# Patient Record
Sex: Female | Born: 1945
Health system: Southern US, Community
[De-identification: ages and names within clinical notes are randomized; demographics above are authoritative.]

## PROBLEM LIST (undated history)

## (undated) DIAGNOSIS — M179 Osteoarthritis of knee, unspecified: Secondary | ICD-10-CM

## (undated) DIAGNOSIS — R011 Cardiac murmur, unspecified: Secondary | ICD-10-CM

## (undated) DIAGNOSIS — I779 Disorder of arteries and arterioles, unspecified: Secondary | ICD-10-CM

## (undated) DIAGNOSIS — I700 Atherosclerosis of aorta: Secondary | ICD-10-CM

## (undated) DIAGNOSIS — E119 Type 2 diabetes mellitus without complications: Secondary | ICD-10-CM

## (undated) DIAGNOSIS — T8859XA Other complications of anesthesia, initial encounter: Secondary | ICD-10-CM

## (undated) DIAGNOSIS — E7800 Pure hypercholesterolemia, unspecified: Secondary | ICD-10-CM

## (undated) DIAGNOSIS — E039 Hypothyroidism, unspecified: Secondary | ICD-10-CM

## (undated) DIAGNOSIS — F329 Major depressive disorder, single episode, unspecified: Secondary | ICD-10-CM

## (undated) DIAGNOSIS — D649 Anemia, unspecified: Secondary | ICD-10-CM

## (undated) DIAGNOSIS — M1710 Unilateral primary osteoarthritis, unspecified knee: Secondary | ICD-10-CM

## (undated) DIAGNOSIS — R0989 Other specified symptoms and signs involving the circulatory and respiratory systems: Secondary | ICD-10-CM

## (undated) DIAGNOSIS — Z972 Presence of dental prosthetic device (complete) (partial): Secondary | ICD-10-CM

## (undated) DIAGNOSIS — K219 Gastro-esophageal reflux disease without esophagitis: Secondary | ICD-10-CM

## (undated) DIAGNOSIS — B029 Zoster without complications: Secondary | ICD-10-CM

## (undated) DIAGNOSIS — F32A Depression, unspecified: Secondary | ICD-10-CM

## (undated) DIAGNOSIS — L570 Actinic keratosis: Secondary | ICD-10-CM

## (undated) DIAGNOSIS — R918 Other nonspecific abnormal finding of lung field: Secondary | ICD-10-CM

## (undated) HISTORY — DX: Hypothyroidism, unspecified: E03.9

## (undated) HISTORY — PX: BREAST CYST EXCISION: SHX579

## (undated) HISTORY — DX: Osteoarthritis of knee, unspecified: M17.9

## (undated) HISTORY — DX: Pure hypercholesterolemia, unspecified: E78.00

## (undated) HISTORY — DX: Unilateral primary osteoarthritis, unspecified knee: M17.10

## (undated) HISTORY — DX: Type 2 diabetes mellitus without complications: E11.9

## (undated) HISTORY — PX: INNER EAR SURGERY: SHX679

## (undated) HISTORY — DX: Depression, unspecified: F32.A

## (undated) HISTORY — DX: Major depressive disorder, single episode, unspecified: F32.9

## (undated) HISTORY — PX: FOOT SURGERY: SHX648

---

## 1983-11-07 HISTORY — PX: ABDOMINAL HYSTERECTOMY: SHX81

## 2004-08-09 ENCOUNTER — Ambulatory Visit: Payer: Self-pay | Admitting: Internal Medicine

## 2004-12-26 ENCOUNTER — Ambulatory Visit: Payer: Self-pay | Admitting: Internal Medicine

## 2005-01-04 ENCOUNTER — Ambulatory Visit: Payer: Self-pay | Admitting: Internal Medicine

## 2005-02-04 ENCOUNTER — Ambulatory Visit: Payer: Self-pay | Admitting: Internal Medicine

## 2005-08-14 ENCOUNTER — Ambulatory Visit: Payer: Self-pay | Admitting: Internal Medicine

## 2006-02-06 ENCOUNTER — Ambulatory Visit: Payer: Self-pay | Admitting: Internal Medicine

## 2006-08-15 ENCOUNTER — Ambulatory Visit: Payer: Self-pay | Admitting: Internal Medicine

## 2007-08-19 ENCOUNTER — Ambulatory Visit: Payer: Self-pay | Admitting: Internal Medicine

## 2008-08-20 ENCOUNTER — Ambulatory Visit: Payer: Self-pay | Admitting: Internal Medicine

## 2008-08-25 ENCOUNTER — Ambulatory Visit: Payer: Self-pay | Admitting: Internal Medicine

## 2009-03-09 ENCOUNTER — Ambulatory Visit: Payer: Self-pay | Admitting: General Surgery

## 2009-08-23 ENCOUNTER — Ambulatory Visit: Payer: Self-pay | Admitting: Internal Medicine

## 2010-08-24 ENCOUNTER — Ambulatory Visit: Payer: Self-pay | Admitting: Internal Medicine

## 2010-11-14 ENCOUNTER — Ambulatory Visit: Payer: Self-pay | Admitting: Internal Medicine

## 2011-02-05 ENCOUNTER — Ambulatory Visit: Payer: Self-pay | Admitting: Gynecologic Oncology

## 2011-02-28 ENCOUNTER — Ambulatory Visit: Payer: Self-pay | Admitting: Gynecologic Oncology

## 2011-03-07 ENCOUNTER — Ambulatory Visit: Payer: Self-pay | Admitting: Gynecologic Oncology

## 2011-03-21 ENCOUNTER — Ambulatory Visit: Payer: Self-pay | Admitting: Gynecologic Oncology

## 2011-03-28 ENCOUNTER — Ambulatory Visit: Payer: Self-pay | Admitting: Gynecologic Oncology

## 2011-03-31 LAB — PATHOLOGY REPORT

## 2011-04-04 ENCOUNTER — Ambulatory Visit: Payer: Self-pay | Admitting: Gynecologic Oncology

## 2011-04-07 ENCOUNTER — Ambulatory Visit: Payer: Self-pay | Admitting: Gynecologic Oncology

## 2011-04-11 ENCOUNTER — Ambulatory Visit: Payer: Self-pay | Admitting: Gynecologic Oncology

## 2011-05-07 ENCOUNTER — Ambulatory Visit: Payer: Self-pay | Admitting: Gynecologic Oncology

## 2011-06-07 ENCOUNTER — Ambulatory Visit: Payer: Self-pay | Admitting: Gynecologic Oncology

## 2011-08-28 ENCOUNTER — Ambulatory Visit: Payer: Self-pay | Admitting: Internal Medicine

## 2011-10-03 ENCOUNTER — Ambulatory Visit: Payer: Self-pay | Admitting: Gynecologic Oncology

## 2011-10-07 ENCOUNTER — Ambulatory Visit: Payer: Self-pay | Admitting: Gynecologic Oncology

## 2012-04-02 ENCOUNTER — Ambulatory Visit: Payer: Self-pay | Admitting: Gynecologic Oncology

## 2012-04-06 ENCOUNTER — Ambulatory Visit: Payer: Self-pay | Admitting: Gynecologic Oncology

## 2012-08-06 ENCOUNTER — Telehealth: Payer: Self-pay | Admitting: Internal Medicine

## 2012-08-06 DIAGNOSIS — Z0000 Encounter for general adult medical examination without abnormal findings: Secondary | ICD-10-CM

## 2012-08-06 NOTE — Telephone Encounter (Signed)
Order entered.  Let me know if I need to do anything else.

## 2012-08-06 NOTE — Telephone Encounter (Addendum)
Patient needing a mammogram before October 28 th in the afternoon at [facility] [facility]. I need a mammogram order.

## 2012-08-28 ENCOUNTER — Ambulatory Visit: Payer: Self-pay | Admitting: Internal Medicine

## 2012-09-03 NOTE — Telephone Encounter (Signed)
Per Herbert Seta at [place] patient had this done already On October 20,13.

## 2012-10-08 ENCOUNTER — Ambulatory Visit (INDEPENDENT_AMBULATORY_CARE_PROVIDER_SITE_OTHER): Payer: Medicare Other | Admitting: Internal Medicine

## 2012-10-08 ENCOUNTER — Encounter: Payer: Self-pay | Admitting: Internal Medicine

## 2012-10-08 VITALS — BP 120/72 | HR 84 | Temp 98.0°F | Ht 61.0 in | Wt 170.0 lb

## 2012-10-08 DIAGNOSIS — F329 Major depressive disorder, single episode, unspecified: Secondary | ICD-10-CM

## 2012-10-08 DIAGNOSIS — K219 Gastro-esophageal reflux disease without esophagitis: Secondary | ICD-10-CM

## 2012-10-08 DIAGNOSIS — F32A Depression, unspecified: Secondary | ICD-10-CM | POA: Insufficient documentation

## 2012-10-08 DIAGNOSIS — E119 Type 2 diabetes mellitus without complications: Secondary | ICD-10-CM

## 2012-10-08 DIAGNOSIS — E7800 Pure hypercholesterolemia, unspecified: Secondary | ICD-10-CM

## 2012-10-08 DIAGNOSIS — E1165 Type 2 diabetes mellitus with hyperglycemia: Secondary | ICD-10-CM | POA: Insufficient documentation

## 2012-10-08 DIAGNOSIS — F3289 Other specified depressive episodes: Secondary | ICD-10-CM

## 2012-10-08 DIAGNOSIS — E039 Hypothyroidism, unspecified: Secondary | ICD-10-CM | POA: Insufficient documentation

## 2012-10-08 MED ORDER — ALBUTEROL SULFATE HFA 108 (90 BASE) MCG/ACT IN AERS: 2.0000 | INHALATION_SPRAY | Freq: Four times a day (QID) | RESPIRATORY_TRACT | Status: DC | PRN

## 2012-10-08 MED ORDER — PANTOPRAZOLE SODIUM 40 MG PO TBEC: 40.0000 mg | DELAYED_RELEASE_TABLET | Freq: Every day | ORAL | Status: DC

## 2012-10-08 NOTE — Assessment & Plan Note (Signed)
On Prozac.  Doing well.  Follow.  

## 2012-10-08 NOTE — Progress Notes (Signed)
Subjective:    Patient ID: Jody Wang, female    DOB: 05/28/1946, 66 y.o.   MRN: [id number]  HPI 66 year old female with past history of diabetes, hypercholesterolemia and hypothyroidism who comes in today for a scheduled follow up.  States she is doing well.  No chest pain or tightness.  Feels good.  Stays active.  Working part time at [employer].  Knee is better.  Feels she still needs the Mobic.  Discussed trying to cut down on the dose and seeing if we could get her off.  Sugars averaging 130-140s.  Trying to watch what she eats.  Bowels stable.  No nausea or vomiting.    Past Medical History  Diagnosis Date  . Diabetes mellitus without complication   . Hypercholesterolemia   . Depression   . Hypothyroidism   . Osteoarthritis of knee     Current Outpatient Prescriptions on File Prior to Visit  Medication Sig Dispense Refill  . FLUoxetine (PROZAC) 20 MG capsule Take 20 mg by mouth daily.      Marland Kitchen levothyroxine (SYNTHROID, LEVOTHROID) 88 MCG tablet Take 88 mcg by mouth daily.      . metFORMIN (GLUCOPHAGE) 500 MG tablet Take 2 tablet in the AM, 1 tablet at lunch and 2 q pm      . pioglitazone (ACTOS) 30 MG tablet Take 30 mg by mouth daily.      . sitaGLIPtin (JANUVIA) 100 MG tablet Take 100 mg by mouth daily.      Marland Kitchen albuterol (PROVENTIL HFA;VENTOLIN HFA) 108 (90 BASE) MCG/ACT inhaler Inhale 2 puffs into the lungs every 6 (six) hours as needed for wheezing.  1 Inhaler  0  . pantoprazole (PROTONIX) 40 MG tablet Take 1 tablet (40 mg total) by mouth daily.  30 tablet  3    Review of Systems Patient denies any headache, lightheadedness or dizziness.  No significant sinus or allergy symptoms.  No chest pain, tightness or palpitations.  She does still report increased cough and some sob when she goes out in the cold.  No nausea or vomiting.  Does have some acid reflux issues.  Has to eat a snack at night - secondary to her diabetes.  Has started eating it earlier.  No abdominal pain or cramping.   No bowel change, such as diarrhea, constipation, BRBPR or melana.  No urine change.        Objective:   Physical Exam Filed Vitals:   10/08/12 1426  BP: 120/72  Pulse: 84  Temp: 98 F (84.47 C)   66 year old female in no acute distress.   HEENT:  Nares - clear.  OP- without lesions or erythema.  NECK:  Supple, nontender.  Left carotid bruit vs radiation of murmur.   HEART:  Appears to be regular.  I/VI systolic murmur.  (present previously).   LUNGS:  Without crackles or wheezing audible.  Respirations even and unlabored.   RADIAL PULSE:  Equal bilaterally.  ABDOMEN:  Soft, nontender.  No audible abdominal bruit.   EXTREMITIES:  No increased edema to be present.                     Assessment & Plan:  PULMONARY.  With the increased cough and sob - gave her samples of QVAR.  Will also give her a rescue inhaler to use if needed.  Treat acid reflux.  See if symptoms resolve.  Treat acid reflux.  Get her back in soon to reassess.  CARDIOVASCULAR.  Had a recent stress test (per her report) - negative.  Describes the sob as outlined.  She states it worsens if she is in cold weather.  Will treat acid reflux and pulmonary symptoms as outlined.  Hold on repeat cardiac w/up.  Obtain results.  Follow closely.  If any change in symptoms or problems, she is to be reevaluated.  She was comfortable with this plans.  She reports she has used her roommates inhaler and this has helped the sob.    MSK.  Knee is doing better.  She still believes she needs the Mobic. Discussed risk and side effects of long term use.  Also with the concern regarding the acid reflux, would like to get her off of the medication.  Will have her reduce the dose to 7.5mg  q day.  Follow.    HEALTH MAINTENANCE.  Obtain records to review.   Get her scheduled for her physical when due.

## 2012-10-08 NOTE — Assessment & Plan Note (Signed)
On thyroid replacement.  Check tsh.  

## 2012-10-08 NOTE — Patient Instructions (Addendum)
It was nice seeing you today.  I am glad you are doing well.  I do want you to start protonix 40mg  - take 30 min before bed and zantac (ranitidine 150mg ) 30 minutes before your evening meal.  Let me know if persistent problems.

## 2012-10-08 NOTE — Assessment & Plan Note (Signed)
On no medication.  Low cholesterol diet and exercise.  Follow.   

## 2012-10-09 ENCOUNTER — Encounter: Payer: Self-pay | Admitting: Internal Medicine

## 2012-10-09 DIAGNOSIS — K219 Gastro-esophageal reflux disease without esophagitis: Secondary | ICD-10-CM | POA: Insufficient documentation

## 2012-10-09 MED ORDER — MELOXICAM 7.5 MG PO TABS: ORAL_TABLET | ORAL | Status: DC

## 2012-10-09 NOTE — Assessment & Plan Note (Signed)
Sugars averaging 130-140s.  Trying to watch what she eats.  Check met b and a1c.

## 2012-10-09 NOTE — Assessment & Plan Note (Signed)
With the reflux as outlined.  Will start Protonix 40mg  q am and instructed her to take Zantac before her evening meal.  Follow closely.  Get her back in soon to reassess.

## 2012-10-14 ENCOUNTER — Other Ambulatory Visit: Payer: Self-pay | Admitting: *Deleted

## 2012-10-14 NOTE — Telephone Encounter (Signed)
Called in script for One Touch Ultra Test Strips

## 2012-10-17 ENCOUNTER — Other Ambulatory Visit: Payer: Self-pay | Admitting: *Deleted

## 2012-10-17 MED ORDER — GLUCOSE BLOOD VI STRP: ORAL_STRIP | Status: DC

## 2012-11-06 ENCOUNTER — Ambulatory Visit: Payer: Self-pay | Admitting: Gynecologic Oncology

## 2012-12-07 ENCOUNTER — Ambulatory Visit: Payer: Self-pay | Admitting: Gynecologic Oncology

## 2012-12-22 ENCOUNTER — Telehealth: Payer: Self-pay | Admitting: Internal Medicine

## 2012-12-22 MED ORDER — MELOXICAM 7.5 MG PO TABS: ORAL_TABLET | ORAL | Status: DC

## 2012-12-22 NOTE — Telephone Encounter (Signed)
Refilled mobic  ?

## 2013-01-01 ENCOUNTER — Other Ambulatory Visit: Payer: Self-pay | Admitting: *Deleted

## 2013-01-01 MED ORDER — METFORMIN HCL 500 MG PO TABS: ORAL_TABLET | ORAL | Status: DC

## 2013-01-01 NOTE — Telephone Encounter (Signed)
Sent in to pharmacy.  

## 2013-01-02 ENCOUNTER — Other Ambulatory Visit: Payer: Self-pay | Admitting: *Deleted

## 2013-01-03 MED ORDER — FLUOXETINE HCL 20 MG PO CAPS: 20.0000 mg | ORAL_CAPSULE | Freq: Every day | ORAL | Status: DC

## 2013-01-15 ENCOUNTER — Encounter: Payer: Self-pay | Admitting: Internal Medicine

## 2013-01-15 ENCOUNTER — Ambulatory Visit (INDEPENDENT_AMBULATORY_CARE_PROVIDER_SITE_OTHER): Payer: Medicare Other | Admitting: Internal Medicine

## 2013-01-15 VITALS — BP 102/60 | HR 88 | Temp 98.8°F | Ht 61.0 in | Wt 167.2 lb

## 2013-01-15 DIAGNOSIS — F32A Depression, unspecified: Secondary | ICD-10-CM

## 2013-01-15 DIAGNOSIS — F3289 Other specified depressive episodes: Secondary | ICD-10-CM

## 2013-01-15 DIAGNOSIS — E7800 Pure hypercholesterolemia, unspecified: Secondary | ICD-10-CM

## 2013-01-15 DIAGNOSIS — F329 Major depressive disorder, single episode, unspecified: Secondary | ICD-10-CM

## 2013-01-15 DIAGNOSIS — K219 Gastro-esophageal reflux disease without esophagitis: Secondary | ICD-10-CM

## 2013-01-15 DIAGNOSIS — E119 Type 2 diabetes mellitus without complications: Secondary | ICD-10-CM

## 2013-01-15 DIAGNOSIS — E039 Hypothyroidism, unspecified: Secondary | ICD-10-CM

## 2013-01-15 LAB — CBC WITH DIFFERENTIAL/PLATELET
Basophils Relative: 0.2 % (ref 0.0–3.0)
Eosinophils Absolute: 0.5 10*3/uL (ref 0.0–0.7)
HCT: 39 % (ref 36.0–46.0)
Hemoglobin: 13.2 g/dL (ref 12.0–15.0)
MCHC: 33.8 g/dL (ref 30.0–36.0)
MCV: 82.4 fl (ref 78.0–100.0)
Monocytes Absolute: 0.6 10*3/uL (ref 0.1–1.0)
Neutro Abs: 5.7 10*3/uL (ref 1.4–7.7)
RBC: 4.73 Mil/uL (ref 3.87–5.11)

## 2013-01-15 LAB — MICROALBUMIN / CREATININE URINE RATIO: Microalb Creat Ratio: 0.5 mg/g (ref 0.0–30.0)

## 2013-01-15 LAB — BASIC METABOLIC PANEL
BUN: 13 mg/dL (ref 6–23)
CO2: 23 mEq/L (ref 19–32)
Chloride: 104 mEq/L (ref 96–112)
Creatinine, Ser: 0.8 mg/dL (ref 0.4–1.2)

## 2013-01-15 LAB — LDL CHOLESTEROL, DIRECT: Direct LDL: 154.5 mg/dL

## 2013-01-15 LAB — LIPID PANEL
Cholesterol: 209 mg/dL — ABNORMAL HIGH (ref 0–200)
Total CHOL/HDL Ratio: 5
VLDL: 36.2 mg/dL (ref 0.0–40.0)

## 2013-01-15 LAB — TSH: TSH: 2.51 u[IU]/mL (ref 0.35–5.50)

## 2013-01-15 LAB — HEPATIC FUNCTION PANEL
Albumin: 4.2 g/dL (ref 3.5–5.2)
Alkaline Phosphatase: 69 U/L (ref 39–117)

## 2013-01-15 MED ORDER — GLUCOSE BLOOD VI STRP: 1.0000 | ORAL_STRIP | Status: DC | PRN

## 2013-01-16 ENCOUNTER — Encounter: Payer: Self-pay | Admitting: Internal Medicine

## 2013-01-16 NOTE — Assessment & Plan Note (Signed)
On thyroid replacement.  Check tsh.  

## 2013-01-16 NOTE — Assessment & Plan Note (Signed)
Sugars averaging 130-140s.  Trying to watch what she eats.  Check met b and a1c.  She stopped her actos and does not want to restart.  Follow.

## 2013-01-16 NOTE — Progress Notes (Signed)
Subjective:    Patient ID: Jody Wang, female    DOB: [birth year], 67 y.o.   MRN: [id number]  HPI 67 year old female with past history of diabetes, hypercholesterolemia and hypothyroidism who comes in today to follow up on these issues as well as for a compete physical exam.  States she is doing well.  No chest pain or tightness.  Feels good.  Stays active.  Working part time at [employer].   Sugars averaging 130-140s.  Brought in no recorded sugar readings.  Trying to watch what she eats.  Has stopped her actos.  Still on metformin and Januvia.  Does not want to take actos.  Has been off for two weeks.  Bowels stable.  No nausea or vomiting.  Her breathing is better since she gave her bird away.  Still using the inhalers.  States cold air aggravates also.  Overall she feels better.   Past Medical History  Diagnosis Date  . Diabetes mellitus without complication   . Hypercholesterolemia   . Depression   . Hypothyroidism   . Osteoarthritis of knee     Current Outpatient Prescriptions on File Prior to Visit  Medication Sig Dispense Refill  . albuterol (PROVENTIL HFA;VENTOLIN HFA) 108 (90 BASE) MCG/ACT inhaler Inhale 2 puffs into the lungs every 6 (six) hours as needed for wheezing.  1 Inhaler  0  . FLUoxetine (PROZAC) 20 MG capsule Take 1 capsule (20 mg total) by mouth daily.  90 capsule  1  . glucose blood test strip One touch ultra test strip Check blood sugar bid  100 each  4  . levothyroxine (SYNTHROID, LEVOTHROID) 88 MCG tablet Take 88 mcg by mouth daily.      . meloxicam (MOBIC) 7.5 MG tablet Take 1-2 tablets q day prn.  60 tablet  1  . metFORMIN (GLUCOPHAGE) 500 MG tablet Take 2 tablet in the AM, 1 tablet at lunch and 2 q pm  60 tablet  5  . pravastatin (PRAVACHOL) 20 MG tablet Take 20 mg by mouth daily.      . sitaGLIPtin (JANUVIA) 100 MG tablet Take 100 mg by mouth daily.      . pantoprazole (PROTONIX) 40 MG tablet Take 1 tablet (40 mg total) by mouth daily.  30 tablet  3  .  pioglitazone (ACTOS) 30 MG tablet Take 30 mg by mouth daily.       No current facility-administered medications on file prior to visit.    Review of Systems Patient denies any headache, lightheadedness or dizziness.  No significant sinus or allergy symptoms.  No chest pain, tightness or palpitations.  Breathing is better.  Aggravated by cold weather. No nausea or vomiting.   No abdominal pain or cramping.  No bowel change, such as diarrhea, constipation, BRBPR or melana.  No urine change.        Objective:   Physical Exam  Filed Vitals:   01/15/13 1036  BP: 102/60  Pulse: 88  Temp: 98.8 F (37.1 C)   Blood pressure recheck:  128/78, pulse 55  67 year old female in no acute distress.   HEENT:  Nares- clear.  Oropharynx - without lesions. NECK:  Supple.  Nontender.  Question of bruit vs radiation of murmur.  HEART:  Appears to be regular.  I/VI systolic murmur (present previously).   LUNGS:  No crackles or wheezing audible.  Respirations even and unlabored.  RADIAL PULSE:  Equal bilaterally.    BREASTS:  No nipple discharge or nipple retraction  present.  Could not appreciate any distinct nodules or axillary adenopathy.  ABDOMEN:  Soft, nontender.  Bowel sounds present and normal.  No audible abdominal bruit.  GU:  She declines.     EXTREMITIES:  No increased edema present.  DP pulses palpable and equal bilaterally.   Feet without lesions.          Assessment & Plan:  PULMONARY.  Breathing is better.  Continue inhalers. (will continue flovent and albuterol).   Better since she gave away her bird.  Follow.    CARDIOVASCULAR.  Had a recent stress test (per her report) - negative.  Breathing better.  Follow.    MSK.  Knee is doing better.  Follow.   HEALTH MAINTENANCE.  Physical today.  Schedule mammogram when due.  She declines colonoscopy. Discussed again with her today regarding the importance of screening.  She continues to decline.

## 2013-01-16 NOTE — Assessment & Plan Note (Signed)
On no medication.  Low cholesterol diet and exercise.  Follow.   

## 2013-01-16 NOTE — Assessment & Plan Note (Signed)
Not reported as an issue today.  Continue current regimen.   

## 2013-01-16 NOTE — Assessment & Plan Note (Signed)
On Prozac.  Doing well.  Follow.  

## 2013-01-22 ENCOUNTER — Telehealth: Payer: Self-pay | Admitting: Internal Medicine

## 2013-01-22 MED ORDER — ALPRAZOLAM 0.25 MG PO TABS: 0.2500 mg | ORAL_TABLET | Freq: Two times a day (BID) | ORAL | Status: DC | PRN

## 2013-01-22 NOTE — Telephone Encounter (Signed)
Spoke to pt.  She has taken xanax previously and tolerated.  Please call in xanax .25mg  - one po bid prn #20 with no refills.  [facility]

## 2013-01-22 NOTE — Telephone Encounter (Signed)
Please advise 

## 2013-01-22 NOTE — Telephone Encounter (Signed)
Patient needing something to get through her boyfriends death. He passed on yesterday and she is extremely upset.

## 2013-01-22 NOTE — Telephone Encounter (Signed)
Called into pharmacy

## 2013-02-01 ENCOUNTER — Telehealth: Payer: Self-pay | Admitting: Internal Medicine

## 2013-02-01 MED ORDER — ALPRAZOLAM 0.25 MG PO TABS: 0.2500 mg | ORAL_TABLET | Freq: Two times a day (BID) | ORAL | Status: DC | PRN

## 2013-02-01 NOTE — Telephone Encounter (Signed)
Pt notified of lab results.  a1c 7.1.  Will follow.  Same meds for now.  Improved.  Low cholesterol diet.  Will follow.  Still with increased stress from recent unexpected death of her boyfriend.  Also grandson just diagnosed with stage 4 brain tumor.  Xanax working for her.  Needs refill.  Called refill xanax .25mg  bid prn #30 with on refills.  She will call if needs anything more.

## 2013-02-07 LAB — HM DIABETES EYE EXAM

## 2013-02-25 ENCOUNTER — Telehealth: Payer: Self-pay

## 2013-02-25 MED ORDER — ALPRAZOLAM 0.25 MG PO TABS: 0.2500 mg | ORAL_TABLET | Freq: Two times a day (BID) | ORAL | Status: DC | PRN

## 2013-02-25 NOTE — Telephone Encounter (Signed)
Ok to call in refill of xanax x 1.

## 2013-02-25 NOTE — Telephone Encounter (Signed)
Please Advise....  Alprazolam (Xanax) 0.25 mg tablet  take 1 tablet by mouth twice a day if needed  [facility]

## 2013-02-25 NOTE — Telephone Encounter (Signed)
Called in the Xanax #60 with 1 rf to [employer]

## 2013-03-03 ENCOUNTER — Other Ambulatory Visit: Payer: Self-pay | Admitting: *Deleted

## 2013-03-03 MED ORDER — MELOXICAM 7.5 MG PO TABS: ORAL_TABLET | ORAL | Status: DC

## 2013-04-28 ENCOUNTER — Telehealth: Payer: Self-pay | Admitting: *Deleted

## 2013-04-28 NOTE — Telephone Encounter (Signed)
Pt is currently on Meloxicam (refilled on 03/03/13 #60 with 3 refills)-Pt is now asking for Nabumetone 750mg  BID for leg inflammation ([facility])

## 2013-04-29 NOTE — Telephone Encounter (Signed)
Why does she want to change medication?  Is she having worsening problems?  If increased problems or change in symptoms, will need reevaluation.

## 2013-04-29 NOTE — Telephone Encounter (Signed)
I was talking about staying on antiinflammatories in general.  This is an antiiflammatory also.  Will need to monitor blood pressure and monitor for any GI side effects.

## 2013-04-29 NOTE — Telephone Encounter (Signed)
Pt states that you told her that you didn't want her to take the Meloxicam long term, and she tried her friends medication and it worked better. She recently saw someone for her legs & will contact their office to see if they would let her try the Nabumetone first. If not, she will call back to schedule an appt.

## 2013-05-01 ENCOUNTER — Other Ambulatory Visit: Payer: Self-pay | Admitting: *Deleted

## 2013-05-01 MED ORDER — PANTOPRAZOLE SODIUM 40 MG PO TBEC: 40.0000 mg | DELAYED_RELEASE_TABLET | Freq: Every day | ORAL | Status: DC

## 2013-05-01 NOTE — Telephone Encounter (Signed)
Will discuss further at follow-up visit.

## 2013-05-13 ENCOUNTER — Other Ambulatory Visit: Payer: Self-pay | Admitting: *Deleted

## 2013-05-13 MED ORDER — ALPRAZOLAM 0.25 MG PO TABS: 0.2500 mg | ORAL_TABLET | Freq: Two times a day (BID) | ORAL | Status: DC | PRN

## 2013-05-13 NOTE — Telephone Encounter (Signed)
ok'd refill for alprazolam #60 with no refills.   

## 2013-05-13 NOTE — Telephone Encounter (Signed)
Rx left on voicemail

## 2013-05-19 ENCOUNTER — Ambulatory Visit: Payer: Medicare Other | Admitting: Internal Medicine

## 2013-06-16 ENCOUNTER — Encounter: Payer: Self-pay | Admitting: Internal Medicine

## 2013-06-16 ENCOUNTER — Ambulatory Visit (INDEPENDENT_AMBULATORY_CARE_PROVIDER_SITE_OTHER): Payer: Medicare Other | Admitting: Internal Medicine

## 2013-06-16 VITALS — BP 120/70 | HR 69 | Temp 98.4°F | Ht 61.0 in | Wt 167.5 lb

## 2013-06-16 DIAGNOSIS — E119 Type 2 diabetes mellitus without complications: Secondary | ICD-10-CM

## 2013-06-16 DIAGNOSIS — E039 Hypothyroidism, unspecified: Secondary | ICD-10-CM

## 2013-06-16 DIAGNOSIS — F3289 Other specified depressive episodes: Secondary | ICD-10-CM

## 2013-06-16 DIAGNOSIS — K219 Gastro-esophageal reflux disease without esophagitis: Secondary | ICD-10-CM

## 2013-06-16 DIAGNOSIS — E7800 Pure hypercholesterolemia, unspecified: Secondary | ICD-10-CM

## 2013-06-16 DIAGNOSIS — F329 Major depressive disorder, single episode, unspecified: Secondary | ICD-10-CM

## 2013-06-16 DIAGNOSIS — F32A Depression, unspecified: Secondary | ICD-10-CM

## 2013-06-17 ENCOUNTER — Encounter: Payer: Self-pay | Admitting: Internal Medicine

## 2013-06-17 ENCOUNTER — Telehealth: Payer: Self-pay | Admitting: Internal Medicine

## 2013-06-17 NOTE — Assessment & Plan Note (Signed)
Not reported as an issue today.  Continue current regimen.   

## 2013-06-17 NOTE — Assessment & Plan Note (Signed)
On thyroid replacement.  Check tsh.  

## 2013-06-17 NOTE — Assessment & Plan Note (Signed)
On no medication.  Low cholesterol diet and exercise.  Follow.   

## 2013-06-17 NOTE — Assessment & Plan Note (Signed)
Sugars averaging 140-180s.  Trying to watch what she eats.  Check met b and a1c.  She stopped her actos and does not want to restart.  Follow.

## 2013-06-17 NOTE — Assessment & Plan Note (Signed)
On Prozac.  Taking xanax prn.  Boyfriend recently passed away unexpectedly.  Hard time dealing with this.  Offered counseling.  She declines.  Will notify me when agreeable.

## 2013-06-17 NOTE — Progress Notes (Signed)
Subjective:    Patient ID: Jody Wang, female    DOB: [birth year], 67 y.o.   MRN: [id number]  HPI 67 year old female with past history of diabetes, hypercholesterolemia and hypothyroidism who comes in today for a scheduled follow up.   Increased stress.  Her boyfriend of five years passed away unexpectedly.  Having a hard time dealing with this.  Taking xanax prn.  On prozac.  Cries easily.  Has good support.  Discussed counseling.  Will think about this.   Working part time at [employer].  Enjoys this.  Sugars elevated.  States averaging 140-180 whenever checked.   Brought in no recorded sugar readings.  Trying to watch what she eats.  Has stopped her actos.  Still on metformin and Januvia.  Does not want to take actos.   Bowels stable.  No nausea or vomiting.  Her breathing is better since she gave her bird away.  Not requiring the inhalers.     Past Medical History  Diagnosis Date  . Diabetes mellitus without complication   . Hypercholesterolemia   . Depression   . Hypothyroidism   . Osteoarthritis of knee     Current Outpatient Prescriptions on File Prior to Visit  Medication Sig Dispense Refill  . albuterol (PROVENTIL HFA;VENTOLIN HFA) 108 (90 BASE) MCG/ACT inhaler Inhale 2 puffs into the lungs every 6 (six) hours as needed for wheezing.  1 Inhaler  0  . ALPRAZolam (XANAX) 0.25 MG tablet Take 1 tablet (0.25 mg total) by mouth 2 (two) times daily as needed for sleep.  60 tablet  0  . FLUoxetine (PROZAC) 20 MG capsule Take 1 capsule (20 mg total) by mouth daily.  90 capsule  1  . glucose blood test strip One touch ultra test strip Check blood sugar bid  100 each  4  . levothyroxine (SYNTHROID, LEVOTHROID) 88 MCG tablet Take 88 mcg by mouth daily.      . meloxicam (MOBIC) 7.5 MG tablet Take 1-2 tablets q day prn.  60 tablet  3  . metFORMIN (GLUCOPHAGE) 500 MG tablet Take 2 tablet in the AM, 1 tablet at lunch and 2 q pm  60 tablet  5  . pantoprazole (PROTONIX) 40 MG tablet Take 1 tablet  (40 mg total) by mouth daily.  30 tablet  5  . pravastatin (PRAVACHOL) 20 MG tablet Take 20 mg by mouth daily.      . sitaGLIPtin (JANUVIA) 100 MG tablet Take 100 mg by mouth daily.       Current Facility-Administered Medications on File Prior to Visit  Medication Dose Route Frequency Provider Last Rate Last Dose  . glucose blood test strip STRP 1 each  1 each Other PRN Charm Barges, MD        Review of Systems Patient denies any headache, lightheadedness or dizziness.  No significant sinus or allergy symptoms.  No chest pain, tightness or palpitations.  Breathing is better.   No nausea or vomiting.   No abdominal pain or cramping.  No bowel change, such as diarrhea, constipation, BRBPR or melana.  No urine change.   Increased stress as outlined.       Objective:   Physical Exam  Filed Vitals:   06/16/13 1628  BP: 120/70  Pulse: 69  Temp: 98.4 F (53.57 C)   67 year old female in no acute distress.   HEENT:  Nares- clear.  Oropharynx - without lesions. NECK:  Supple.  Nontender.  Question of bruit vs radiation  of murmur.  HEART:  Appears to be regular.  I/VI systolic murmur (present previously).   LUNGS:  No crackles or wheezing audible.  Respirations even and unlabored.  RADIAL PULSE:  Equal bilaterally.   ABDOMEN:  Soft, nontender.  Bowel sounds present and normal.  No audible abdominal bruit.     EXTREMITIES:  No increased edema present.  DP pulses palpable and equal bilaterally.   Feet without lesions.          Assessment & Plan:  PULMONARY.  Breathing is better.  Off inhalers. (will continue flovent and albuterol).   Better since she gave away her bird.  Follow.    CARDIOVASCULAR.  Had a recent stress test (per her report) - negative.  Breathing better.  Follow.    MSK.  Knee pain.  Discussed further /w/up.  She is taking mobic.  Can add tylenol.  Will notify me if she desires further w/up.    HEALTH MAINTENANCE.  Physical last visit.  She declines colonoscopy.

## 2013-06-17 NOTE — Telephone Encounter (Signed)
Needs a follow up appt in 2 months ( ).  Schedule fasting labs within one week.  Thanks.

## 2013-06-20 NOTE — Telephone Encounter (Signed)
Left message on both home and cell phone asking pt to call office  °

## 2013-06-24 ENCOUNTER — Other Ambulatory Visit: Payer: Self-pay | Admitting: *Deleted

## 2013-06-24 MED ORDER — LEVOTHYROXINE SODIUM 88 MCG PO TABS: 88.0000 ug | ORAL_TABLET | Freq: Every day | ORAL | Status: DC

## 2013-06-24 NOTE — Telephone Encounter (Signed)
Left message on both home and cell phone asking pt to call office  °

## 2013-06-24 NOTE — Telephone Encounter (Signed)
Spoke with pt and scheduled

## 2013-06-25 ENCOUNTER — Encounter: Payer: Self-pay | Admitting: *Deleted

## 2013-06-27 ENCOUNTER — Other Ambulatory Visit: Payer: Self-pay | Admitting: *Deleted

## 2013-06-27 MED ORDER — FLUOXETINE HCL 20 MG PO CAPS: 20.0000 mg | ORAL_CAPSULE | Freq: Every day | ORAL | Status: DC

## 2013-06-30 ENCOUNTER — Other Ambulatory Visit (INDEPENDENT_AMBULATORY_CARE_PROVIDER_SITE_OTHER): Payer: Medicare Other

## 2013-06-30 ENCOUNTER — Encounter: Payer: Self-pay | Admitting: Internal Medicine

## 2013-06-30 DIAGNOSIS — E7800 Pure hypercholesterolemia, unspecified: Secondary | ICD-10-CM

## 2013-06-30 DIAGNOSIS — E119 Type 2 diabetes mellitus without complications: Secondary | ICD-10-CM

## 2013-06-30 LAB — COMPREHENSIVE METABOLIC PANEL
AST: 20 U/L (ref 0–37)
Alkaline Phosphatase: 72 U/L (ref 39–117)
BUN: 14 mg/dL (ref 6–23)
Creatinine, Ser: 0.8 mg/dL (ref 0.4–1.2)
Glucose, Bld: 176 mg/dL — ABNORMAL HIGH (ref 70–99)
Total Bilirubin: 0.6 mg/dL (ref 0.3–1.2)

## 2013-06-30 LAB — LIPID PANEL
Cholesterol: 254 mg/dL — ABNORMAL HIGH (ref 0–200)
HDL: 36.3 mg/dL — ABNORMAL LOW (ref >39.00)
Total CHOL/HDL Ratio: 7
Triglycerides: 350 mg/dL — ABNORMAL HIGH (ref 0.0–149.0)
VLDL: 70 mg/dL — ABNORMAL HIGH (ref 0.0–40.0)

## 2013-06-30 NOTE — Telephone Encounter (Signed)
Lab appointment 8/25  Dr appointment 10/20 pt aware

## 2013-07-28 ENCOUNTER — Other Ambulatory Visit: Payer: Self-pay | Admitting: *Deleted

## 2013-07-29 NOTE — Telephone Encounter (Signed)
Left message for pt to return my call regarding Metformin directions

## 2013-07-31 MED ORDER — METFORMIN HCL 500 MG PO TABS: ORAL_TABLET | ORAL | Status: DC

## 2013-07-31 NOTE — Telephone Encounter (Signed)
Spoke with pt, and clarified directions with her. She is taking 2 tabs in the AM, 1 tab at lunch, and 2 tabs in the evening, per Med list. Quantity sent as #150 rather than requested #60.

## 2013-07-31 NOTE — Telephone Encounter (Signed)
Left message for pt to return my call.

## 2013-08-25 ENCOUNTER — Ambulatory Visit: Payer: Medicare Other | Admitting: Internal Medicine

## 2013-08-26 ENCOUNTER — Encounter: Payer: Self-pay | Admitting: *Deleted

## 2013-09-01 ENCOUNTER — Other Ambulatory Visit: Payer: Self-pay | Admitting: *Deleted

## 2013-09-01 ENCOUNTER — Ambulatory Visit: Payer: Self-pay | Admitting: Internal Medicine

## 2013-09-01 MED ORDER — PRAVASTATIN SODIUM 20 MG PO TABS: 20.0000 mg | ORAL_TABLET | Freq: Every day | ORAL | Status: DC

## 2013-09-08 ENCOUNTER — Ambulatory Visit: Payer: Self-pay | Admitting: Internal Medicine

## 2013-09-09 ENCOUNTER — Encounter: Payer: Self-pay | Admitting: Internal Medicine

## 2013-09-15 ENCOUNTER — Ambulatory Visit: Payer: Self-pay | Admitting: Internal Medicine

## 2013-09-17 ENCOUNTER — Telehealth: Payer: Self-pay | Admitting: *Deleted

## 2013-09-17 NOTE — Telephone Encounter (Signed)
Dr. Deboraha Sprang called to report that her breast biopsy was benign. No evidence of malignancy. Recommends yearly imaging

## 2013-09-17 NOTE — Telephone Encounter (Signed)
noted 

## 2013-09-18 ENCOUNTER — Telehealth: Payer: Self-pay | Admitting: Internal Medicine

## 2013-09-18 NOTE — Telephone Encounter (Signed)
Pt came by and dropped of forms to be filled out for her medication. I put them in Dr. Roby Lofts inbox up front. Pt also wanted to me to let Dr. Lorin Picket know that her Jody Wang passed away 4 weeks ago.

## 2013-09-19 ENCOUNTER — Other Ambulatory Visit: Payer: Self-pay | Admitting: *Deleted

## 2013-09-19 MED ORDER — ALPRAZOLAM 0.25 MG PO TABS: 0.2500 mg | ORAL_TABLET | Freq: Two times a day (BID) | ORAL | Status: DC | PRN

## 2013-09-19 NOTE — Telephone Encounter (Signed)
Okay to refill? 

## 2013-09-19 NOTE — Telephone Encounter (Signed)
Refilled xanax #60 with no refills.   

## 2013-09-19 NOTE — Telephone Encounter (Signed)
Noted. Will complete forms

## 2013-09-26 ENCOUNTER — Encounter: Payer: Self-pay | Admitting: Internal Medicine

## 2013-09-29 ENCOUNTER — Encounter: Payer: Self-pay | Admitting: Internal Medicine

## 2013-10-06 ENCOUNTER — Telehealth: Payer: Self-pay | Admitting: Internal Medicine

## 2013-10-06 ENCOUNTER — Encounter: Payer: Self-pay | Admitting: Internal Medicine

## 2013-10-06 ENCOUNTER — Ambulatory Visit (INDEPENDENT_AMBULATORY_CARE_PROVIDER_SITE_OTHER): Payer: Medicare Other | Admitting: Internal Medicine

## 2013-10-06 ENCOUNTER — Encounter (INDEPENDENT_AMBULATORY_CARE_PROVIDER_SITE_OTHER): Payer: Self-pay

## 2013-10-06 ENCOUNTER — Ambulatory Visit (INDEPENDENT_AMBULATORY_CARE_PROVIDER_SITE_OTHER)
Admission: RE | Admit: 2013-10-06 | Discharge: 2013-10-06 | Disposition: A | Discharge status: Home / Self Care | Payer: Medicare Other | Source: Ambulatory Visit | Attending: Internal Medicine | Admitting: Internal Medicine

## 2013-10-06 VITALS — BP 106/66 | HR 87 | Temp 98.2°F | Resp 12 | Ht 61.0 in | Wt 164.5 lb

## 2013-10-06 DIAGNOSIS — E7800 Pure hypercholesterolemia, unspecified: Secondary | ICD-10-CM

## 2013-10-06 DIAGNOSIS — M25519 Pain in unspecified shoulder: Secondary | ICD-10-CM

## 2013-10-06 DIAGNOSIS — M25511 Pain in right shoulder: Secondary | ICD-10-CM

## 2013-10-06 DIAGNOSIS — K219 Gastro-esophageal reflux disease without esophagitis: Secondary | ICD-10-CM

## 2013-10-06 DIAGNOSIS — F329 Major depressive disorder, single episode, unspecified: Secondary | ICD-10-CM

## 2013-10-06 DIAGNOSIS — E039 Hypothyroidism, unspecified: Secondary | ICD-10-CM

## 2013-10-06 DIAGNOSIS — F3289 Other specified depressive episodes: Secondary | ICD-10-CM

## 2013-10-06 DIAGNOSIS — F32A Depression, unspecified: Secondary | ICD-10-CM

## 2013-10-06 DIAGNOSIS — E119 Type 2 diabetes mellitus without complications: Secondary | ICD-10-CM

## 2013-10-06 MED ORDER — TRAZODONE HCL 50 MG PO TABS: 25.0000 mg | ORAL_TABLET | Freq: Every evening | ORAL | Status: DC | PRN

## 2013-10-06 MED ORDER — MELOXICAM 15 MG PO TABS: 15.0000 mg | ORAL_TABLET | Freq: Every day | ORAL | Status: DC

## 2013-10-06 MED ORDER — PANTOPRAZOLE SODIUM 40 MG PO TBEC: 40.0000 mg | DELAYED_RELEASE_TABLET | Freq: Every day | ORAL | Status: DC

## 2013-10-06 NOTE — Progress Notes (Signed)
Pre visit review using our clinic review tool, if applicable. No additional management support is needed unless otherwise documented below in the visit note. 

## 2013-10-06 NOTE — Progress Notes (Signed)
Subjective:    Patient ID: Jody Wang, female    DOB: 1945-12-01, 67 y.o.   MRN: [id number]  HPI 67 year old female with past history of diabetes, hypercholesterolemia and hypothyroidism who comes in today for a scheduled follow up.   Increased stress.  Her boyfriend of five years passed away unexpectedly.  Having a hard time dealing with this.  Her grandson also just recently passed away.  States she is not sleeping well.  Feels she needs something to help her sleep.  Taking xanax prn.  On prozac.  Cries easily.  Has good support.  Have discussed counseling.  Working part time at [employer].  Enjoys this.  Sugars elevated.  States averaging 140-180 whenever checked.  Has stopped her actos.  Still on metformin and Januvia.  Does not want to take actos.   With the increased stress, she has not been watching her diet.  Not exercising.  Bowels stable.  No nausea or vomiting.  Having increased right shoulder pain.  Limited rom.  Increased pain with full extension.      Past Medical History  Diagnosis Date  . Diabetes mellitus without complication   . Hypercholesterolemia   . Depression   . Hypothyroidism   . Osteoarthritis of knee     Current Outpatient Prescriptions on File Prior to Visit  Medication Sig Dispense Refill  . ALPRAZolam (XANAX) 0.25 MG tablet Take 1 tablet (0.25 mg total) by mouth 2 (two) times daily as needed for sleep.  60 tablet  0  . FLUoxetine (PROZAC) 20 MG capsule Take 1 capsule (20 mg total) by mouth daily.  90 capsule  1  . glucose blood test strip One touch ultra test strip Check blood sugar bid  100 each  4  . levothyroxine (SYNTHROID, LEVOTHROID) 88 MCG tablet Take 1 tablet (88 mcg total) by mouth daily.  30 tablet  10  . meloxicam (MOBIC) 7.5 MG tablet Take 1-2 tablets q day prn.  60 tablet  3  . metFORMIN (GLUCOPHAGE) 500 MG tablet Take 2 tablet in the AM, 1 tablet at lunch and 2 q pm  150 tablet  5  . pantoprazole (PROTONIX) 40 MG tablet Take 1 tablet (40 mg total)  by mouth daily.  30 tablet  5  . pravastatin (PRAVACHOL) 20 MG tablet Take 1 tablet (20 mg total) by mouth daily.  30 tablet  5  . sitaGLIPtin (JANUVIA) 100 MG tablet Take 100 mg by mouth daily.       Current Facility-Administered Medications on File Prior to Visit  Medication Dose Route Frequency Provider Last Rate Last Dose  . glucose blood test strip STRP 1 each  1 each Other PRN Charm Barges, MD        Review of Systems Patient denies any headache, lightheadedness or dizziness.  No significant sinus or allergy symptoms.  No chest pain, tightness or palpitations.  Breathing stable.   No nausea or vomiting.   No abdominal pain or cramping.  No bowel change, such as diarrhea, constipation, BRBPR or melana.  No urine change.   Increased stress as outlined.  Not sleeping.  Right shoulder pain as outlined.       Objective:   Physical Exam  Filed Vitals:   10/06/13 1138  BP: 106/66  Pulse: 87  Temp: 98.2 F (36.8 C)  Resp: 12   Blood pressure recheck:  59/81  67 year old female in no acute distress.   HEENT:  Nares- clear.  Oropharynx -  without lesions. NECK:  Supple.  Nontender.  Question of bruit vs radiation of murmur.  HEART:  Appears to be regular.  I/VI systolic murmur (present previously).   LUNGS:  No crackles or wheezing audible.  Respirations even and unlabored.  RADIAL PULSE:  Equal bilaterally.   ABDOMEN:  Soft, nontender.  Bowel sounds present and normal.  No audible abdominal bruit.     EXTREMITIES:  No increased edema present.  DP pulses palpable and equal bilaterally.   FEET:  without lesions.  MSK:  Increased pain right shoulder - with full extension and abduction.           Assessment & Plan:  PULMONARY.  Breathing is better.   Better since she gave away her bird.  Follow.    CARDIOVASCULAR.  Had a recent stress test (per her report) - negative.  Breathing better.  Follow.      HEALTH MAINTENANCE.  Physical 01/15/13.  She declines colonoscopy.  Mammogram  09/01/13 recommended f/u right breast mammo.  F/u mammogram recommended biopsy.  Biopsy negative.

## 2013-10-08 ENCOUNTER — Encounter: Payer: Self-pay | Admitting: Internal Medicine

## 2013-10-08 DIAGNOSIS — M25511 Pain in right shoulder: Secondary | ICD-10-CM | POA: Insufficient documentation

## 2013-10-08 NOTE — Assessment & Plan Note (Signed)
Sugars averaging 140-180s.  Elevated.  With the increased stress as outlined, she has not been watching what she eats and not exercising.  Not sleeping well.  Will treat with trazodone to see if we can improve her sleep.  She plans to get more serious about her diet and exercise.  Discussed need to eat earlier in the am and eat regular meals.  Discussed importance of eating a bedtime snack.  Will hold on additional medication.  She prefers to try the above and see if she can get her sugar down without additional medication.  Is up to date with eye exams.

## 2013-10-08 NOTE — Assessment & Plan Note (Signed)
On no medication.  Low cholesterol diet and exercise.  Follow.   

## 2013-10-08 NOTE — Assessment & Plan Note (Signed)
Shoulder pain as outlined.  Check xray.  Further w/up pending.

## 2013-10-08 NOTE — Assessment & Plan Note (Signed)
On Prozac.  Taking xanax prn.  Boyfriend recently passed away unexpectedly.  Grandson recently passed away.  Hard time dealing with this.  Have offered counseling.  She has declined.  Trouble sleeping.  Start trazodone.  Follow.

## 2013-10-08 NOTE — Assessment & Plan Note (Signed)
Not reported as an issue today.  Continue current regimen.   

## 2013-10-08 NOTE — Assessment & Plan Note (Signed)
On thyroid replacement.  Follow tsh.  

## 2013-10-09 ENCOUNTER — Other Ambulatory Visit: Payer: Self-pay | Admitting: Internal Medicine

## 2013-10-09 DIAGNOSIS — M25511 Pain in right shoulder: Secondary | ICD-10-CM

## 2013-10-09 NOTE — Progress Notes (Signed)
Order placed for referral to ortho 

## 2013-10-20 NOTE — Telephone Encounter (Signed)
error 

## 2013-11-05 ENCOUNTER — Ambulatory Visit: Payer: Medicare Other | Admitting: Internal Medicine

## 2013-11-26 ENCOUNTER — Other Ambulatory Visit: Payer: Self-pay | Admitting: *Deleted

## 2013-11-26 MED ORDER — ALPRAZOLAM 0.25 MG PO TABS: 0.2500 mg | ORAL_TABLET | Freq: Two times a day (BID) | ORAL | Status: DC | PRN

## 2013-11-26 NOTE — Telephone Encounter (Signed)
Rx faxed to pharmacy  

## 2013-11-26 NOTE — Telephone Encounter (Signed)
Refilled xanax #60 with no refills.   

## 2013-11-26 NOTE — Telephone Encounter (Signed)
Ok refill? 

## 2013-12-01 ENCOUNTER — Other Ambulatory Visit: Payer: Self-pay | Admitting: *Deleted

## 2013-12-01 MED ORDER — MELOXICAM 15 MG PO TABS: 15.0000 mg | ORAL_TABLET | Freq: Every day | ORAL | Status: DC

## 2013-12-01 MED ORDER — GLUCOSE BLOOD VI STRP: ORAL_STRIP | Status: DC

## 2013-12-15 ENCOUNTER — Other Ambulatory Visit: Payer: Self-pay | Admitting: *Deleted

## 2013-12-16 MED ORDER — FLUOXETINE HCL 20 MG PO CAPS: 20.0000 mg | ORAL_CAPSULE | Freq: Every day | ORAL | Status: DC

## 2013-12-31 ENCOUNTER — Other Ambulatory Visit: Payer: Self-pay | Admitting: *Deleted

## 2013-12-31 MED ORDER — PANTOPRAZOLE SODIUM 40 MG PO TBEC: 40.0000 mg | DELAYED_RELEASE_TABLET | Freq: Every day | ORAL | Status: DC

## 2013-12-31 MED ORDER — LEVOTHYROXINE SODIUM 88 MCG PO TABS: 88.0000 ug | ORAL_TABLET | Freq: Every day | ORAL | Status: DC

## 2013-12-31 MED ORDER — PRAVASTATIN SODIUM 20 MG PO TABS: 20.0000 mg | ORAL_TABLET | Freq: Every day | ORAL | Status: DC

## 2013-12-31 MED ORDER — METFORMIN HCL 500 MG PO TABS: ORAL_TABLET | ORAL | Status: DC

## 2013-12-31 MED ORDER — FLUOXETINE HCL 20 MG PO CAPS: 20.0000 mg | ORAL_CAPSULE | Freq: Every day | ORAL | Status: DC

## 2013-12-31 NOTE — Telephone Encounter (Signed)
Ok refill to mail order? 

## 2014-01-01 NOTE — Telephone Encounter (Signed)
Ok to refill #30 of meloxicam and #60 of xanax with no refills,  but she needs labs.  Needs to come in the next 1-2 weeks for fasting labs.  Labs ordered.

## 2014-01-02 ENCOUNTER — Other Ambulatory Visit: Payer: Self-pay | Admitting: *Deleted

## 2014-01-02 MED ORDER — ALPRAZOLAM 0.25 MG PO TABS: 0.2500 mg | ORAL_TABLET | Freq: Two times a day (BID) | ORAL | Status: DC | PRN

## 2014-01-02 MED ORDER — MELOXICAM 15 MG PO TABS
15.0000 mg | ORAL_TABLET | Freq: Every day | ORAL | Status: DC
Start: ? — End: 2014-01-28

## 2014-01-02 NOTE — Telephone Encounter (Signed)
Left message for pt to call office to schedule fasting labs. Rx sent to pharmacy by escript

## 2014-01-02 NOTE — Telephone Encounter (Signed)
Ok refill to mail order? 

## 2014-01-02 NOTE — Telephone Encounter (Signed)
I usually do not do the xanax mail order.  Refilled #60 with no refills.

## 2014-01-05 ENCOUNTER — Other Ambulatory Visit: Payer: Self-pay | Admitting: *Deleted

## 2014-01-05 MED ORDER — TRAZODONE HCL 50 MG PO TABS: 50.0000 mg | ORAL_TABLET | Freq: Every evening | ORAL | Status: DC | PRN

## 2014-01-28 ENCOUNTER — Other Ambulatory Visit: Payer: Self-pay | Admitting: *Deleted

## 2014-01-28 NOTE — Telephone Encounter (Signed)
Can refill x 1 only, but she needs labs before her next refill.  Fasting lab are ordered.  Needs appt.  Thanks.

## 2014-01-28 NOTE — Telephone Encounter (Signed)
Ok to fill 

## 2014-01-29 ENCOUNTER — Encounter: Payer: Self-pay | Admitting: *Deleted

## 2014-01-29 MED ORDER — MELOXICAM 15 MG PO TABS: 15.0000 mg | ORAL_TABLET | Freq: Every day | ORAL | Status: DC

## 2014-01-29 NOTE — Telephone Encounter (Signed)
yes

## 2014-01-29 NOTE — Telephone Encounter (Signed)
Was she notified of need for lab appt?

## 2014-01-29 NOTE — Telephone Encounter (Signed)
Mailed letter & pt to call back to schedule lab appointment

## 2014-02-19 ENCOUNTER — Telehealth: Payer: Self-pay | Admitting: Internal Medicine

## 2014-02-19 NOTE — Telephone Encounter (Signed)
Pt dropped off paperwork to be completed by Dr. Nicki Reaper.  Placed in Dr. Bary Leriche box.

## 2014-02-19 NOTE — Telephone Encounter (Signed)
Paperwork placed in your folder.

## 2014-02-24 ENCOUNTER — Other Ambulatory Visit: Payer: Medicare Other

## 2014-02-27 ENCOUNTER — Other Ambulatory Visit: Payer: Self-pay | Admitting: *Deleted

## 2014-02-27 MED ORDER — MELOXICAM 15 MG PO TABS: 15.0000 mg | ORAL_TABLET | Freq: Every day | ORAL | Status: DC

## 2014-03-02 ENCOUNTER — Other Ambulatory Visit: Payer: Self-pay | Admitting: *Deleted

## 2014-03-02 NOTE — Telephone Encounter (Signed)
Pt needs fasting labs per Dr. Bary Leriche last refill note, pt did not scheduled when advised of need last 2 months.

## 2014-03-03 ENCOUNTER — Other Ambulatory Visit: Payer: Self-pay | Admitting: *Deleted

## 2014-03-05 ENCOUNTER — Other Ambulatory Visit: Payer: Self-pay | Admitting: *Deleted

## 2014-03-06 ENCOUNTER — Other Ambulatory Visit: Payer: Self-pay | Admitting: *Deleted

## 2014-03-06 NOTE — Telephone Encounter (Signed)
Pt needs fasting labs per Dr. Bary Leriche last refill note, pt did not schedule when advised of need last 2 months

## 2014-03-09 ENCOUNTER — Other Ambulatory Visit: Payer: Self-pay | Admitting: *Deleted

## 2014-03-10 ENCOUNTER — Other Ambulatory Visit: Payer: Self-pay | Admitting: *Deleted

## 2014-03-10 NOTE — Telephone Encounter (Signed)
Ok to refill Meloxicam

## 2014-03-11 NOTE — Telephone Encounter (Signed)
Left message to return call 

## 2014-03-11 NOTE — Telephone Encounter (Signed)
Per review, the meloxicam was refilled on 02/28/14 (#90 with no refills).  Please clarify with pt that she is aware and the medication has been sent in to correct place.

## 2014-03-13 ENCOUNTER — Other Ambulatory Visit: Payer: Self-pay | Admitting: *Deleted

## 2014-03-17 ENCOUNTER — Other Ambulatory Visit: Payer: Self-pay | Admitting: *Deleted

## 2014-03-19 ENCOUNTER — Other Ambulatory Visit: Payer: Self-pay | Admitting: *Deleted

## 2014-03-19 MED ORDER — MELOXICAM 15 MG PO TABS: 15.0000 mg | ORAL_TABLET | Freq: Every day | ORAL | Status: DC

## 2014-03-19 NOTE — Telephone Encounter (Signed)
Noted.  Would await her response before refilling - secondary to she may not need.

## 2014-03-24 ENCOUNTER — Other Ambulatory Visit: Payer: Self-pay | Admitting: *Deleted

## 2014-03-24 MED ORDER — ALPRAZOLAM 0.25 MG PO TABS: 0.2500 mg | ORAL_TABLET | Freq: Two times a day (BID) | ORAL | Status: DC | PRN

## 2014-03-31 ENCOUNTER — Other Ambulatory Visit: Payer: Self-pay | Admitting: *Deleted

## 2014-04-02 ENCOUNTER — Other Ambulatory Visit (INDEPENDENT_AMBULATORY_CARE_PROVIDER_SITE_OTHER): Payer: Commercial Managed Care - HMO

## 2014-04-02 DIAGNOSIS — E7800 Pure hypercholesterolemia, unspecified: Secondary | ICD-10-CM

## 2014-04-02 DIAGNOSIS — E039 Hypothyroidism, unspecified: Secondary | ICD-10-CM

## 2014-04-02 DIAGNOSIS — E119 Type 2 diabetes mellitus without complications: Secondary | ICD-10-CM

## 2014-04-02 LAB — HEPATIC FUNCTION PANEL
ALT: 14 U/L (ref 0–35)
AST: 17 U/L (ref 0–37)
Albumin: 4.3 g/dL (ref 3.5–5.2)
Alkaline Phosphatase: 71 U/L (ref 39–117)
Bilirubin, Direct: 0.1 mg/dL (ref 0.0–0.3)
TOTAL PROTEIN: 7.3 g/dL (ref 6.0–8.3)
Total Bilirubin: 0.5 mg/dL (ref 0.2–1.2)

## 2014-04-02 LAB — LIPID PANEL
CHOLESTEROL: 222 mg/dL — AB (ref 0–200)
HDL: 41.3 mg/dL (ref >39.00)
LDL Cholesterol: 150 mg/dL — ABNORMAL HIGH (ref 0–99)
TRIGLYCERIDES: 156 mg/dL — AB (ref 0.0–149.0)
Total CHOL/HDL Ratio: 5
VLDL: 31.2 mg/dL (ref 0.0–40.0)

## 2014-04-02 LAB — BASIC METABOLIC PANEL
BUN: 14 mg/dL (ref 6–23)
CHLORIDE: 103 meq/L (ref 96–112)
CO2: 27 meq/L (ref 19–32)
Calcium: 9.3 mg/dL (ref 8.4–10.5)
Creatinine, Ser: 0.8 mg/dL (ref 0.4–1.2)
GFR: 79.12 mL/min (ref >60.00)
Glucose, Bld: 164 mg/dL — ABNORMAL HIGH (ref 70–99)
Potassium: 4.4 mEq/L (ref 3.5–5.1)
Sodium: 140 mEq/L (ref 135–145)

## 2014-04-02 LAB — TSH: TSH: 0.82 u[IU]/mL (ref 0.35–4.50)

## 2014-04-02 LAB — HEMOGLOBIN A1C: HEMOGLOBIN A1C: 7.6 % — AB (ref 4.6–6.5)

## 2014-04-07 ENCOUNTER — Encounter: Payer: Self-pay | Admitting: Internal Medicine

## 2014-04-07 ENCOUNTER — Ambulatory Visit (INDEPENDENT_AMBULATORY_CARE_PROVIDER_SITE_OTHER): Payer: Commercial Managed Care - HMO | Admitting: Internal Medicine

## 2014-04-07 VITALS — BP 120/70 | HR 84 | Temp 98.3°F | Ht 61.0 in | Wt 154.5 lb

## 2014-04-07 DIAGNOSIS — E039 Hypothyroidism, unspecified: Secondary | ICD-10-CM

## 2014-04-07 DIAGNOSIS — Z9109 Other allergy status, other than to drugs and biological substances: Secondary | ICD-10-CM

## 2014-04-07 DIAGNOSIS — E119 Type 2 diabetes mellitus without complications: Secondary | ICD-10-CM

## 2014-04-07 DIAGNOSIS — E7800 Pure hypercholesterolemia, unspecified: Secondary | ICD-10-CM

## 2014-04-07 DIAGNOSIS — F329 Major depressive disorder, single episode, unspecified: Secondary | ICD-10-CM

## 2014-04-07 DIAGNOSIS — R634 Abnormal weight loss: Secondary | ICD-10-CM

## 2014-04-07 DIAGNOSIS — F3289 Other specified depressive episodes: Secondary | ICD-10-CM

## 2014-04-07 DIAGNOSIS — K219 Gastro-esophageal reflux disease without esophagitis: Secondary | ICD-10-CM

## 2014-04-07 DIAGNOSIS — F32A Depression, unspecified: Secondary | ICD-10-CM

## 2014-04-07 MED ORDER — PRAVASTATIN SODIUM 40 MG PO TABS: 40.0000 mg | ORAL_TABLET | Freq: Every day | ORAL | Status: DC

## 2014-04-07 MED ORDER — ALPRAZOLAM 0.25 MG PO TABS: 0.2500 mg | ORAL_TABLET | Freq: Every evening | ORAL | Status: DC | PRN

## 2014-04-07 MED ORDER — CETIRIZINE HCL 10 MG PO TABS: 10.0000 mg | ORAL_TABLET | Freq: Every day | ORAL | Status: DC

## 2014-04-07 NOTE — Progress Notes (Signed)
Pre visit review using our clinic review tool, if applicable. No additional management support is needed unless otherwise documented below in the visit note. 

## 2014-04-12 ENCOUNTER — Encounter: Payer: Self-pay | Admitting: Internal Medicine

## 2014-04-12 DIAGNOSIS — Z9109 Other allergy status, other than to drugs and biological substances: Secondary | ICD-10-CM | POA: Insufficient documentation

## 2014-04-12 DIAGNOSIS — R634 Abnormal weight loss: Secondary | ICD-10-CM | POA: Insufficient documentation

## 2014-04-12 NOTE — Assessment & Plan Note (Signed)
On thyroid replacement.  Follow tsh.  

## 2014-04-12 NOTE — Assessment & Plan Note (Signed)
Controlled on certrizine.

## 2014-04-12 NOTE — Progress Notes (Signed)
Subjective:    Patient ID: Jody Wang, female    DOB: [birth year], 68 y.o.   MRN: [id number]  HPI 68 year old female with past history of diabetes, hypercholesterolemia and hypothyroidism who comes in today for a scheduled follow up.   Has been under increased stress recently.  Her boyfriend of five years passed away unexpectedly.  Had been having a hard time dealing with this.  Her grandson also recently passed away.  She is using xanax to help her sleep.  Overall she feels she is doing better.  Taking her prozac.   Has good support.  Have discussed counseling.  She does not feel she needs counseling currently.   Working part time at [employer].  Enjoys this.  She reports she is getting back in more or a routine of watching her diet and exercising.  AM sugars averaging 140-170 and pm sugars averaging 120-150.  On metformin and Januvia.  Bowels stable.  No nausea or vomiting.      Past Medical History  Diagnosis Date  . Diabetes mellitus without complication   . Hypercholesterolemia   . Depression   . Hypothyroidism   . Osteoarthritis of knee     Current Outpatient Prescriptions on File Prior to Visit  Medication Sig Dispense Refill  . FLUoxetine (PROZAC) 20 MG capsule Take 1 capsule (20 mg total) by mouth daily.  90 capsule  0  . levothyroxine (SYNTHROID, LEVOTHROID) 88 MCG tablet Take 1 tablet (88 mcg total) by mouth daily.  90 tablet  0  . meloxicam (MOBIC) 15 MG tablet Take 1 tablet (15 mg total) by mouth daily.  90 tablet  0  . metFORMIN (GLUCOPHAGE) 500 MG tablet Take 2 tablet in the AM, 1 tablet at lunch and 2 q pm  450 tablet  1  . pantoprazole (PROTONIX) 40 MG tablet Take 1 tablet (40 mg total) by mouth daily.  90 tablet  1  . sitaGLIPtin (JANUVIA) 100 MG tablet Take 100 mg by mouth daily.      . traZODone (DESYREL) 50 MG tablet Take 1 tablet (50 mg total) by mouth at bedtime as needed for sleep.  90 tablet  1   Current Facility-Administered Medications on File Prior to Visit   Medication Dose Route Frequency Provider Last Rate Last Dose  . glucose blood test strip STRP 1 each  1 each Other PRN Alisa Graff, MD        Review of Systems Patient denies any headache, lightheadedness or dizziness.  No significant sinus or allergy symptoms.  Controlled on certrizine.  No chest pain, tightness or palpitations.  Breathing stable.   No nausea or vomiting.   No abdominal pain or cramping.  No bowel change, such as diarrhea, constipation, BRBPR or melana.  No urine change.   Increased stress as outlined.  Overall she feels she is doing better.  Using her xanax to help her sleep.       Objective:   Physical Exam  Filed Vitals:   04/07/14 1353  BP: 120/70  Pulse: 84  Temp: 98.3 F (36.8 C)   Blood pressure recheck:  33/65  68 year old female in no acute distress.   HEENT:  Nares- clear.  Oropharynx - without lesions. NECK:  Supple.  Nontender.  Question of bruit vs radiation of murmur.  HEART:  Appears to be regular.  I/VI systolic murmur (present previously).   LUNGS:  No crackles or wheezing audible.  Respirations even and unlabored.  RADIAL PULSE:  Equal bilaterally.   ABDOMEN:  Soft, nontender.  Bowel sounds present and normal.  No audible abdominal bruit.     EXTREMITIES:  No increased edema present.  DP pulses palpable and equal bilaterally.   FEET:  without lesions.          Assessment & Plan:  PULMONARY.  Breathing is better.      CARDIOVASCULAR.  Had a recent stress test (per her report) - negative.  Breathing better.  Follow.      HEALTH MAINTENANCE.  Schedule a physical next visit.  She declines colonoscopy.  Mammogram 09/01/13 recommended f/u right breast mammo.  F/u mammogram recommended biopsy.  Biopsy negative.  Per report, recommended f/u one year.

## 2014-04-12 NOTE — Assessment & Plan Note (Addendum)
On Prozac.  Taking xanax to help her sleep.  Doing better.   Boyfriend recently passed away unexpectedly.  Grandson recently passed away.  Does not feel she needs anything more at this time.  Follow.

## 2014-04-12 NOTE — Assessment & Plan Note (Signed)
Not reported as an issue today.  Continue current regimen.

## 2014-04-12 NOTE — Assessment & Plan Note (Addendum)
On pravastatin.  Low cholesterol diet and exercise.  Follow.

## 2014-04-12 NOTE — Assessment & Plan Note (Signed)
Has had some weight loss.  Increased stress as outlined.  Follow.  Feels she is doing better.

## 2014-04-12 NOTE — Assessment & Plan Note (Addendum)
Sugars as outlined.  She is starting to do better watching her diet and exercise.  Discussed need to eat earlier in the am and eat regular meals.  Discussed importance of eating a bedtime snack.  Will hold on additional medication. A1c just checked.  Improved some.  Still elevated (7.6).    She prefers to try the above and see if she can get her sugar down without additional medication.  Is up to date with eye exams.  Is followed in [place].

## 2014-04-15 ENCOUNTER — Other Ambulatory Visit: Payer: Self-pay | Admitting: *Deleted

## 2014-04-15 MED ORDER — FLUOXETINE HCL 20 MG PO CAPS: 20.0000 mg | ORAL_CAPSULE | Freq: Every day | ORAL | Status: DC

## 2014-05-20 ENCOUNTER — Telehealth: Payer: Self-pay | Admitting: Internal Medicine

## 2014-05-20 DIAGNOSIS — M25519 Pain in unspecified shoulder: Secondary | ICD-10-CM

## 2014-05-20 NOTE — Telephone Encounter (Signed)
Order placed for ortho referral.  Jody Wang should be contacting her with an appt.

## 2014-05-20 NOTE — Telephone Encounter (Signed)
Please advise if she needs to be seen

## 2014-05-20 NOTE — Telephone Encounter (Signed)
Pt request a referral to Dr. Ammie Ferrier office for her shoulder/msn

## 2014-05-21 ENCOUNTER — Other Ambulatory Visit: Payer: Self-pay | Admitting: *Deleted

## 2014-05-21 MED ORDER — LEVOTHYROXINE SODIUM 88 MCG PO TABS: 88.0000 ug | ORAL_TABLET | Freq: Every day | ORAL | Status: DC

## 2014-05-25 ENCOUNTER — Other Ambulatory Visit: Payer: Self-pay | Admitting: *Deleted

## 2014-05-25 MED ORDER — PANTOPRAZOLE SODIUM 40 MG PO TBEC: 40.0000 mg | DELAYED_RELEASE_TABLET | Freq: Every day | ORAL | Status: DC

## 2014-05-25 MED ORDER — FLUOXETINE HCL 20 MG PO CAPS: 20.0000 mg | ORAL_CAPSULE | Freq: Every day | ORAL | Status: DC

## 2014-06-16 ENCOUNTER — Ambulatory Visit (INDEPENDENT_AMBULATORY_CARE_PROVIDER_SITE_OTHER): Payer: Commercial Managed Care - HMO | Admitting: Internal Medicine

## 2014-06-16 ENCOUNTER — Encounter: Payer: Self-pay | Admitting: Internal Medicine

## 2014-06-16 ENCOUNTER — Other Ambulatory Visit (HOSPITAL_COMMUNITY)
Admission: RE | Admit: 2014-06-16 | Discharge: 2014-06-16 | Disposition: A | Discharge status: Home / Self Care | Payer: Medicare HMO | Source: Ambulatory Visit | Attending: Internal Medicine | Admitting: Internal Medicine

## 2014-06-16 VITALS — BP 130/80 | HR 69 | Temp 98.7°F | Ht 62.0 in | Wt 152.5 lb

## 2014-06-16 DIAGNOSIS — H6123 Impacted cerumen, bilateral: Secondary | ICD-10-CM

## 2014-06-16 DIAGNOSIS — Z9109 Other allergy status, other than to drugs and biological substances: Secondary | ICD-10-CM

## 2014-06-16 DIAGNOSIS — M25519 Pain in unspecified shoulder: Secondary | ICD-10-CM

## 2014-06-16 DIAGNOSIS — F3289 Other specified depressive episodes: Secondary | ICD-10-CM

## 2014-06-16 DIAGNOSIS — K219 Gastro-esophageal reflux disease without esophagitis: Secondary | ICD-10-CM

## 2014-06-16 DIAGNOSIS — Z124 Encounter for screening for malignant neoplasm of cervix: Secondary | ICD-10-CM | POA: Insufficient documentation

## 2014-06-16 DIAGNOSIS — M25511 Pain in right shoulder: Secondary | ICD-10-CM

## 2014-06-16 DIAGNOSIS — E7800 Pure hypercholesterolemia, unspecified: Secondary | ICD-10-CM

## 2014-06-16 DIAGNOSIS — H6120 Impacted cerumen, unspecified ear: Secondary | ICD-10-CM

## 2014-06-16 DIAGNOSIS — Z1151 Encounter for screening for human papillomavirus (HPV): Secondary | ICD-10-CM | POA: Diagnosis present

## 2014-06-16 DIAGNOSIS — F329 Major depressive disorder, single episode, unspecified: Secondary | ICD-10-CM

## 2014-06-16 DIAGNOSIS — E119 Type 2 diabetes mellitus without complications: Secondary | ICD-10-CM

## 2014-06-16 DIAGNOSIS — R634 Abnormal weight loss: Secondary | ICD-10-CM

## 2014-06-16 DIAGNOSIS — E039 Hypothyroidism, unspecified: Secondary | ICD-10-CM

## 2014-06-16 DIAGNOSIS — F32A Depression, unspecified: Secondary | ICD-10-CM

## 2014-06-16 NOTE — Progress Notes (Signed)
Pre visit review using our clinic review tool, if applicable. No additional management support is needed unless otherwise documented below in the visit note. 

## 2014-06-19 LAB — CYTOLOGY - PAP

## 2014-06-21 ENCOUNTER — Encounter: Payer: Self-pay | Admitting: Internal Medicine

## 2014-06-21 DIAGNOSIS — H6120 Impacted cerumen, unspecified ear: Secondary | ICD-10-CM | POA: Insufficient documentation

## 2014-06-21 NOTE — Assessment & Plan Note (Signed)
On thyroid replacement.  Follow tsh.  

## 2014-06-21 NOTE — Assessment & Plan Note (Signed)
On pravastatin.  Low cholesterol diet and exercise.  Follow.

## 2014-06-21 NOTE — Assessment & Plan Note (Signed)
On Prozac.  Tapering.  Taking xanax prn.  Boyfriend recently passed away unexpectedly.  Grandson recently passed away.   Have offered counseling.  She has declined.  Doing better.  Follow.

## 2014-06-21 NOTE — Assessment & Plan Note (Signed)
Controlled on certrizine.

## 2014-06-21 NOTE — Assessment & Plan Note (Signed)
She is doing better watching her diet and exercise.  Sugars appear to be improved.  Again discussed need to eat earlier in the am and eat regular meals.  Discussed importance of eating a bedtime snack.  Will hold on additional medication.  She prefers to try the above and see if she can get her sugar down without additional medication.  Is up to date with eye exams.  Is followed in [place].

## 2014-06-21 NOTE — Assessment & Plan Note (Signed)
Cerumen impaction (right > left).  Refer to ENT for removal (per pts request).

## 2014-06-21 NOTE — Assessment & Plan Note (Signed)
Not reported as an issue today.  Continue current regimen.

## 2014-06-21 NOTE — Assessment & Plan Note (Signed)
Doing better.  Is s/p injection.

## 2014-06-21 NOTE — Progress Notes (Signed)
Subjective:    Patient ID: Jody Wang, female    DOB: 05-26-1946, 68 y.o.   MRN: [id number]  HPI 68 year old female with past history of diabetes, hypercholesterolemia and hypothyroidism who comes in today to follow up on these issues as well as for a complete physical exam.   Has been under increased stress recently.  Her boyfriend of five years passed away unexpectedly.  Had been having a hard time dealing with this.  Her grandson also recently passed away.  She is doing better.  Taking her prozac.   She is trying to taper off the medication.  Has good support.  Have discussed counseling.  She does not feel she needs counseling currently.   Working part time at [employer].  Trying to watch her diet.  Trying to stay active.  On metformin and Januvia.  Sugars improving.  Bowels stable.  No nausea or vomiting.      Past Medical History  Diagnosis Date  . Diabetes mellitus without complication   . Hypercholesterolemia   . Depression   . Hypothyroidism   . Osteoarthritis of knee     Current Outpatient Prescriptions on File Prior to Visit  Medication Sig Dispense Refill  . ALPRAZolam (XANAX) 0.25 MG tablet Take 1 tablet (0.25 mg total) by mouth at bedtime as needed for sleep. MUST KEEP APPT ON 6/2 FOR FURTHER REFILLS  90 tablet  0  . cetirizine (ZYRTEC) 10 MG tablet Take 1 tablet (10 mg total) by mouth daily.  90 tablet  1  . FLUoxetine (PROZAC) 20 MG capsule Take 1 capsule (20 mg total) by mouth daily.  90 capsule  0  . levothyroxine (SYNTHROID, LEVOTHROID) 88 MCG tablet Take 1 tablet (88 mcg total) by mouth daily.  90 tablet  0  . meloxicam (MOBIC) 15 MG tablet Take 1 tablet (15 mg total) by mouth daily.  90 tablet  0  . metFORMIN (GLUCOPHAGE) 500 MG tablet Take 2 tablet in the AM, 1 tablet at lunch and 2 q pm  450 tablet  1  . pantoprazole (PROTONIX) 40 MG tablet Take 1 tablet (40 mg total) by mouth daily.  90 tablet  1  . pravastatin (PRAVACHOL) 40 MG tablet Take 1 tablet (40 mg total) by  mouth daily.  90 tablet  3  . sitaGLIPtin (JANUVIA) 100 MG tablet Take 100 mg by mouth daily.      . traZODone (DESYREL) 50 MG tablet Take 1 tablet (50 mg total) by mouth at bedtime as needed for sleep.  90 tablet  1   Current Facility-Administered Medications on File Prior to Visit  Medication Dose Route Frequency Provider Last Rate Last Dose  . glucose blood test strip STRP 1 each  1 each Other PRN Alisa Graff, MD        Review of Systems Patient denies any headache, lightheadedness or dizziness.  Concern over increased ear wax.  No significant sinus or allergy symptoms.  Controlled on certrizine.  No chest pain, tightness or palpitations.  Breathing stable.   No nausea or vomiting.   No abdominal pain or cramping.  No bowel change, such as diarrhea, constipation, BRBPR or melana.  No urine change.   Increased stress as outlined.  Overall she feels she is doing better.  Using her xanax prn.  Tapering her prozac.  Was having right shoulder pain.  Is s/p cortisone injection.  Better.       Objective:   Physical Exam  Filed Vitals:  06/16/14 1441  BP: 130/80  Pulse: 69  Temp: 98.7 F (37.1 C)   Blood pressure recheck   54/27 68 year old female in no acute distress.   HEENT:  Nares- clear.  Oropharynx - without lesions.  Ears - cerumen impaction - right > left.   NECK:  Supple.  Nontender.  No audible bruit.  HEART:  Appears to be regular. LUNGS:  No crackles or wheezing audible.  Respirations even and unlabored.  RADIAL PULSE:  Equal bilaterally.    BREASTS:  No nipple discharge or nipple retraction present.  Could not appreciate any distinct nodules or axillary adenopathy.  ABDOMEN:  Soft, nontender.  Bowel sounds present and normal.  No audible abdominal bruit.  GU:  Normal external genitalia.  Vaginal vault without lesions.  Cervix identified.  PAP performed.  Could not appreciate any adnexal masses or tenderness.    EXTREMITIES:  No increased edema present.  DP pulses  palpable and equal bilaterally. FEET:  without lesions.          Assessment & Plan:  PULMONARY.  Breathing is better.      CARDIOVASCULAR.  Had a recent stress test (per her report) - negative.  Breathing better.  Follow.      HEALTH MAINTENANCE.  Physical today.   She declines colonoscopy.  Discussed with her today.  Mammogram 09/01/13 recommended f/u right breast mammo.  F/u mammogram recommended biopsy.  Biopsy negative.  Per report, recommended f/u one year.

## 2014-06-21 NOTE — Assessment & Plan Note (Signed)
Has had some weight loss.  Increased stress as outlined.  Follow.  Feels she is doing better.  States eating well.  Monitoring her diet.  Trying to exercise more.

## 2014-06-22 ENCOUNTER — Encounter: Payer: Self-pay | Admitting: *Deleted

## 2014-07-07 ENCOUNTER — Encounter: Payer: Self-pay | Admitting: Internal Medicine

## 2014-07-14 ENCOUNTER — Other Ambulatory Visit: Payer: Self-pay | Admitting: *Deleted

## 2014-07-14 MED ORDER — METFORMIN HCL 500 MG PO TABS: ORAL_TABLET | ORAL | Status: DC

## 2014-07-21 ENCOUNTER — Other Ambulatory Visit: Payer: Self-pay | Admitting: *Deleted

## 2014-07-21 MED ORDER — LEVOTHYROXINE SODIUM 88 MCG PO TABS: 88.0000 ug | ORAL_TABLET | Freq: Every day | ORAL | Status: DC

## 2014-08-13 ENCOUNTER — Other Ambulatory Visit: Payer: Self-pay | Admitting: Internal Medicine

## 2014-08-13 ENCOUNTER — Telehealth: Payer: Self-pay | Admitting: *Deleted

## 2014-08-13 NOTE — Telephone Encounter (Signed)
Pt called states her Cholesterol medication was increased from 10mg  to 20mg .  She states the 20mg  is too strong and causes her to ache.  She further states she has discontinued the 20mg  and will resume the 10mg  once she stops aching.  Please advise

## 2014-08-13 NOTE — Telephone Encounter (Signed)
Noted.  Let me know if any problems or if the aching does not subside.

## 2014-08-18 ENCOUNTER — Other Ambulatory Visit: Payer: Commercial Managed Care - HMO | Admitting: Internal Medicine

## 2014-08-20 ENCOUNTER — Ambulatory Visit: Payer: Commercial Managed Care - HMO | Admitting: Internal Medicine

## 2014-08-27 ENCOUNTER — Other Ambulatory Visit: Payer: Self-pay | Admitting: Internal Medicine

## 2014-08-31 ENCOUNTER — Other Ambulatory Visit: Payer: Self-pay | Admitting: *Deleted

## 2014-08-31 MED ORDER — ALPRAZOLAM 0.25 MG PO TABS
0.2500 mg | ORAL_TABLET | Freq: Every evening | ORAL | Status: DC | PRN
Start: 2014-08-31 — End: 2014-11-05

## 2014-08-31 NOTE — Telephone Encounter (Signed)
Refilled xanax #90 with no refills.

## 2014-08-31 NOTE — Telephone Encounter (Signed)
Faxed to pharmacy

## 2014-08-31 NOTE — Telephone Encounter (Signed)
Ok refill to mail order? Last refill 04/07/14 #90, last visit 06/16/14

## 2014-09-10 ENCOUNTER — Ambulatory Visit: Payer: Self-pay | Admitting: Internal Medicine

## 2014-09-10 LAB — HM MAMMOGRAPHY: HM Mammogram: NEGATIVE

## 2014-09-11 ENCOUNTER — Encounter: Payer: Self-pay | Admitting: Internal Medicine

## 2014-10-12 ENCOUNTER — Other Ambulatory Visit: Payer: Self-pay | Admitting: Internal Medicine

## 2014-10-15 ENCOUNTER — Other Ambulatory Visit: Payer: Self-pay | Admitting: Internal Medicine

## 2014-10-29 ENCOUNTER — Ambulatory Visit: Payer: Commercial Managed Care - HMO | Admitting: Internal Medicine

## 2014-10-29 ENCOUNTER — Other Ambulatory Visit: Payer: Self-pay | Admitting: Internal Medicine

## 2014-11-05 ENCOUNTER — Encounter: Payer: Self-pay | Admitting: Internal Medicine

## 2014-11-05 ENCOUNTER — Ambulatory Visit (INDEPENDENT_AMBULATORY_CARE_PROVIDER_SITE_OTHER): Payer: Commercial Managed Care - HMO | Admitting: Internal Medicine

## 2014-11-05 VITALS — BP 110/80 | HR 83 | Temp 98.1°F | Ht 62.0 in | Wt 152.5 lb

## 2014-11-05 DIAGNOSIS — E7800 Pure hypercholesterolemia, unspecified: Secondary | ICD-10-CM

## 2014-11-05 DIAGNOSIS — E039 Hypothyroidism, unspecified: Secondary | ICD-10-CM

## 2014-11-05 DIAGNOSIS — R0989 Other specified symptoms and signs involving the circulatory and respiratory systems: Secondary | ICD-10-CM

## 2014-11-05 DIAGNOSIS — F32A Depression, unspecified: Secondary | ICD-10-CM

## 2014-11-05 DIAGNOSIS — F329 Major depressive disorder, single episode, unspecified: Secondary | ICD-10-CM

## 2014-11-05 DIAGNOSIS — K219 Gastro-esophageal reflux disease without esophagitis: Secondary | ICD-10-CM

## 2014-11-05 DIAGNOSIS — E119 Type 2 diabetes mellitus without complications: Secondary | ICD-10-CM

## 2014-11-05 DIAGNOSIS — R634 Abnormal weight loss: Secondary | ICD-10-CM

## 2014-11-05 DIAGNOSIS — H6123 Impacted cerumen, bilateral: Secondary | ICD-10-CM

## 2014-11-05 LAB — CBC WITH DIFFERENTIAL/PLATELET
BASOS PCT: 0 % (ref 0–1)
Basophils Absolute: 0 10*3/uL (ref 0.0–0.1)
Eosinophils Absolute: 0.3 10*3/uL (ref 0.0–0.7)
Eosinophils Relative: 4 % (ref 0–5)
HEMATOCRIT: 35.3 % — AB (ref 36.0–46.0)
HEMOGLOBIN: 12.2 g/dL (ref 12.0–15.0)
LYMPHS PCT: 28 % (ref 12–46)
Lymphs Abs: 2.1 10*3/uL (ref 0.7–4.0)
MCH: 28 pg (ref 26.0–34.0)
MCHC: 34.6 g/dL (ref 30.0–36.0)
MCV: 81 fL (ref 78.0–100.0)
MPV: 8.7 fL (ref 8.6–12.4)
Monocytes Absolute: 0.7 10*3/uL (ref 0.1–1.0)
Monocytes Relative: 9 % (ref 3–12)
NEUTROS ABS: 4.4 10*3/uL (ref 1.7–7.7)
NEUTROS PCT: 59 % (ref 43–77)
Platelets: 386 10*3/uL (ref 150–400)
RBC: 4.36 MIL/uL (ref 3.87–5.11)
RDW: 14 % (ref 11.5–15.5)
WBC: 7.5 10*3/uL (ref 4.0–10.5)

## 2014-11-05 MED ORDER — METFORMIN HCL 500 MG PO TABS: ORAL_TABLET | ORAL | Status: DC

## 2014-11-05 MED ORDER — PRAVASTATIN SODIUM 40 MG PO TABS: 20.0000 mg | ORAL_TABLET | Freq: Every day | ORAL | Status: DC

## 2014-11-05 MED ORDER — MELOXICAM 15 MG PO TABS: 15.0000 mg | ORAL_TABLET | Freq: Every day | ORAL | Status: DC

## 2014-11-05 MED ORDER — ALPRAZOLAM 0.25 MG PO TABS: 0.2500 mg | ORAL_TABLET | Freq: Every evening | ORAL | Status: DC | PRN

## 2014-11-05 MED ORDER — LEVOTHYROXINE SODIUM 88 MCG PO TABS: 88.0000 ug | ORAL_TABLET | Freq: Every day | ORAL | Status: DC

## 2014-11-05 MED ORDER — FLUOXETINE HCL 20 MG PO CAPS: 20.0000 mg | ORAL_CAPSULE | Freq: Every day | ORAL | Status: DC

## 2014-11-05 MED ORDER — PANTOPRAZOLE SODIUM 40 MG PO TBEC: 40.0000 mg | DELAYED_RELEASE_TABLET | Freq: Every day | ORAL | Status: DC

## 2014-11-05 NOTE — Progress Notes (Signed)
Pre visit review using our clinic review tool, if applicable. No additional management support is needed unless otherwise documented below in the visit note. 

## 2014-11-06 LAB — HEPATIC FUNCTION PANEL
ALT: 9 U/L (ref 0–35)
AST: 11 U/L (ref 0–37)
Albumin: 4.3 g/dL (ref 3.5–5.2)
Alkaline Phosphatase: 65 U/L (ref 39–117)
Bilirubin, Direct: 0.1 mg/dL (ref 0.0–0.3)
Indirect Bilirubin: 0.2 mg/dL (ref 0.2–1.2)
Total Bilirubin: 0.3 mg/dL (ref 0.2–1.2)
Total Protein: 6.6 g/dL (ref 6.0–8.3)

## 2014-11-06 LAB — HEMOGLOBIN A1C
Hgb A1c MFr Bld: 7 % — ABNORMAL HIGH (ref <5.7)
Mean Plasma Glucose: 154 mg/dL — ABNORMAL HIGH (ref <117)

## 2014-11-06 LAB — LIPID PANEL
CHOLESTEROL: 198 mg/dL (ref 0–200)
HDL: 42 mg/dL (ref >39)
LDL Cholesterol: 139 mg/dL — ABNORMAL HIGH (ref 0–99)
Total CHOL/HDL Ratio: 4.7 Ratio
Triglycerides: 87 mg/dL (ref <150)
VLDL: 17 mg/dL (ref 0–40)

## 2014-11-06 LAB — BASIC METABOLIC PANEL
BUN: 15 mg/dL (ref 6–23)
CO2: 24 mEq/L (ref 19–32)
Calcium: 9.5 mg/dL (ref 8.4–10.5)
Chloride: 103 mEq/L (ref 96–112)
Creat: 0.71 mg/dL (ref 0.50–1.10)
Glucose, Bld: 147 mg/dL — ABNORMAL HIGH (ref 70–99)
Potassium: 4.8 mEq/L (ref 3.5–5.3)
Sodium: 138 mEq/L (ref 135–145)

## 2014-11-06 LAB — MICROALBUMIN / CREATININE URINE RATIO
Creatinine, Urine: 58.7 mg/dL
Microalb, Ur: <0.2 mg/dL (ref <2.0)

## 2014-11-07 ENCOUNTER — Encounter: Payer: Self-pay | Admitting: *Deleted

## 2014-11-08 ENCOUNTER — Encounter: Payer: Self-pay | Admitting: Internal Medicine

## 2014-11-08 NOTE — Progress Notes (Signed)
Subjective:    Patient ID: Jody Wang, female    DOB: 1946/03/17, 69 y.o.   MRN: [id number]  HPI 69 year old female with past history of diabetes, hypercholesterolemia and hypothyroidism who comes in today for a scheduled follow up.  Has been under increased stress recently.  Her boyfriend of five years passed away unexpectedly.  Had been having a hard time dealing with this.  Her grandson also recently passed away.  She also recently lost her dog of 16 years.  She feels she is coping relatively well.  She is doing better.  Taking her prozac.   Has good support.  Have discussed counseling.  She does not feel she needs counseling.   Working part time at [employer].  Trying to watch her diet.  Trying to stay active.  Has not been walking recently.  On metformin and Januvia.   Bowels stable.  No nausea or vomiting.   She does report persistent problems with her ears and cerumen impaction.  She is taking her pravastatin.  Tolerating the 80m dose.  Unable to increase the dose.     Past Medical History  Diagnosis Date  . Diabetes mellitus without complication   . Hypercholesterolemia   . Depression   . Hypothyroidism   . Osteoarthritis of knee     Current Outpatient Prescriptions on File Prior to Visit  Medication Sig Dispense Refill  . cetirizine (ZYRTEC) 10 MG tablet TAKE 1 TABLET EVERY DAY 90 tablet 1  . sitaGLIPtin (JANUVIA) 100 MG tablet Take 100 mg by mouth daily.    . traZODone (DESYREL) 50 MG tablet Take 1 tablet (50 mg total) by mouth at bedtime as needed for sleep. 90 tablet 1   Current Facility-Administered Medications on File Prior to Visit  Medication Dose Route Frequency Provider Last Rate Last Dose  . glucose blood test strip STRP 1 each  1 each Other PRN CEinar Pheasant MD        Review of Systems Patient denies any headache, lightheadedness or dizziness.  Concern over increased ear wax.  No significant sinus or allergy symptoms.  Controlled on certrizine.  No chest pain,  tightness or palpitations.  Breathing stable.   No nausea or vomiting.   No abdominal pain or cramping.  No bowel change, such as diarrhea, constipation, BRBPR or melana.  No urine change.   Increased stress as outlined.  Overall she feels she is doing better.  Using her xanax at night.  Taking her prozac daily.        Objective:   Physical Exam  Filed Vitals:   11/05/14 1143  BP: 110/80  Pulse: 83  Temp: 98.1 F (36.7 C)   Blood pressure recheck   18286 69year old female in no acute distress.   HEENT:  Nares- clear.  Oropharynx - without lesions.  Ears - cerumen impaction - bilateral.    NECK:  Supple.  Nontender.  Left carotid bruit.   HEART:  Appears to be regular. LUNGS:  No crackles or wheezing audible.  Respirations even and unlabored.  RADIAL PULSE:  Equal bilaterally.  ABDOMEN:  Soft, nontender.  Bowel sounds present and normal.  No audible abdominal bruit.    EXTREMITIES:  No increased edema present.  DP pulses palpable and equal bilaterally. FEET:  without lesions.  See simple diabetic foot exam.         Assessment & Plan:  Weight loss Continues.  She is eating regular meals.  Has adjusted her  diet.  Follow.  - CBC with Differential  Type 2 diabetes mellitus without complication Low carb diet and exercise.  Follow met b and a1c.  Keep up to date with eye exams.   - Basic metabolic panel - Hemoglobin A1c - Microalbumin / creatinine urine ratio  Hypothyroidism, unspecified hypothyroidism type On thyroid replacement.  Follow tsh.   Hypercholesterolemia Low cholesterol diet and exercise.  On pravastatin 35m q day.  Cannot tolerate the higher dose of pravastatin.   - Lipid panel - Hepatic function panel  Gastroesophageal reflux disease, esophagitis presence not specified Controlled on protonix.    Cerumen impaction, bilateral Persistent problem.  Refer to ENT for evaluation and treatment.    Depression Doing better.  On prozac.  Uses xanal at night.   Follow.   LEFT CAROTID BRUIT.  Carotid ultrasound.     PULMONARY.  Breathing is better.      CARDIOVASCULAR.  Had a recent stress test (per her report) - negative.  Breathing better.  Follow.      HEALTH MAINTENANCE.  Physical 06/16/14.   She declines colonoscopy.  Discussed with her today.  Mammogram 09/01/13 recommended f/u right breast mammo.  F/u mammogram recommended biopsy.  Biopsy negative.  Per report, recommended f/u one year.  F/u mammogram 09/10/14 - [facility].    I spent 25 minutes with the patient and more than 50% of the time was spent in consultation regarding the above.

## 2014-11-12 DIAGNOSIS — I6529 Occlusion and stenosis of unspecified carotid artery: Secondary | ICD-10-CM | POA: Diagnosis not present

## 2014-12-01 ENCOUNTER — Telehealth: Payer: Self-pay | Admitting: Internal Medicine

## 2014-12-01 NOTE — Telephone Encounter (Signed)
Notify pt that her carotid ultrasound reveals some stenosis (build up in the artery).  Will need to be followed.  I would like to set her up to f/u with vascular surgery - to have them monitor her arteries.   If agreeable, let me know and I will place the order for the referral.

## 2014-12-01 NOTE — Telephone Encounter (Signed)
Left message for pt to return my call.

## 2014-12-02 NOTE — Telephone Encounter (Signed)
Left message for pt to return my call.

## 2014-12-02 NOTE — Telephone Encounter (Signed)
Called and advised patient of results,  verbalized understanding. Will think about referral and call us back. Pt also states she made an appt with Dr. Sabra Heck tomorrow for her shoulder, needs insurance authorization. [place] notified.

## 2014-12-03 ENCOUNTER — Other Ambulatory Visit: Payer: Self-pay | Admitting: Internal Medicine

## 2014-12-03 DIAGNOSIS — M19011 Primary osteoarthritis, right shoulder: Secondary | ICD-10-CM | POA: Diagnosis not present

## 2014-12-16 ENCOUNTER — Other Ambulatory Visit: Payer: Self-pay | Admitting: Internal Medicine

## 2014-12-25 ENCOUNTER — Telehealth: Payer: Self-pay | Admitting: Internal Medicine

## 2014-12-25 NOTE — Telephone Encounter (Signed)
Pt dropped off form to be completed by Dr. Nicki Reaper.  Pt listed medication information on the front of the envelop. Pt was asked to fill out a  Form for intake information and pt stated that she did not have time to fill it out and that Dr. Nicki Reaper has done this form before. Pt stated to use the self addressed envelop to mail the form back to [employer]/msn

## 2014-12-25 NOTE — Telephone Encounter (Signed)
Form placed in blue folder.

## 2014-12-27 NOTE — Telephone Encounter (Signed)
Form completed.  Will place in your box.

## 2015-01-01 NOTE — Telephone Encounter (Signed)
Form mailed to [facility]

## 2015-01-09 ENCOUNTER — Other Ambulatory Visit: Payer: Self-pay | Admitting: Internal Medicine

## 2015-01-09 NOTE — Telephone Encounter (Signed)
Okay to refill Xanax? Last seen on 11/05/14

## 2015-01-09 NOTE — Telephone Encounter (Signed)
ok'd refill for xanax #90 with no refills.

## 2015-01-11 ENCOUNTER — Other Ambulatory Visit: Payer: Self-pay | Admitting: Internal Medicine

## 2015-01-11 NOTE — Telephone Encounter (Signed)
Rx faxed

## 2015-01-21 DIAGNOSIS — E11339 Type 2 diabetes mellitus with moderate nonproliferative diabetic retinopathy without macular edema: Secondary | ICD-10-CM | POA: Diagnosis not present

## 2015-01-21 LAB — HM DIABETES EYE EXAM

## 2015-01-22 ENCOUNTER — Encounter: Payer: Self-pay | Admitting: *Deleted

## 2015-02-04 ENCOUNTER — Telehealth: Payer: Self-pay | Admitting: Internal Medicine

## 2015-02-04 NOTE — Telephone Encounter (Signed)
Pt came in complaining that the paper that was requested for Dr. Nicki Reaper to filled out was sent by to [employer] without Dr. Bary Leriche signature. [employer] has faxed the form back and there has been no response from Dr. Nicki Reaper. Pt stated that she has been working on this since Feb 1st. Pt stated that she is just about out of her medication. Pt stated that she is very happy with the practice and continues to have problems get rx refilled. Pt stated that she feels like no one cares at the practice and phones calls and message are not answered or returned. Please advise pt of status of paper work from [employer].msn

## 2015-02-05 ENCOUNTER — Other Ambulatory Visit: Payer: Self-pay | Admitting: *Deleted

## 2015-02-05 MED ORDER — SITAGLIPTIN PHOSPHATE 100 MG PO TABS: 100.0000 mg | ORAL_TABLET | Freq: Every day | ORAL | Status: DC

## 2015-02-05 NOTE — Telephone Encounter (Signed)
Pt states that she contacted [facility] & they stated that they mailed the form back on 01/13/15 because it was not signed. It looks like the original has now been sent to scan. Pt gave me a website to print off a new form. The only issue is that she needs it by Monday. There are two places that require a physician signature & the patient will also need to fill out her portion. I will print the form & place it in your red folder. This form is to help cover her Nasonex & Januvia.

## 2015-02-05 NOTE — Telephone Encounter (Signed)
LMTCB-see phone note from February. The form was completed & mailed to [facility] as patient requested on 01/04/15.

## 2015-02-05 NOTE — Telephone Encounter (Signed)
Form has been printed & I have completed as much as I could. Pt states that she has enough Januvia to last until Monday & will pick up the 30 day trial offer on Monday along with form.

## 2015-02-05 NOTE — Telephone Encounter (Signed)
Noted.  Let me know if I need to do anything.  ?

## 2015-02-05 NOTE — Telephone Encounter (Signed)
Just place this on the counter - whatever I need to sign.  Does she need a 30 day supply called in until the mail order.  If so, ok.

## 2015-02-08 NOTE — Telephone Encounter (Signed)
Left voicemail notifying patient that Dr. Nicki Reaper portion of form complete & placed up front in drawer for pick up.

## 2015-02-08 NOTE — Telephone Encounter (Signed)
Form signed and placed in your box.  Thanks.

## 2015-02-17 ENCOUNTER — Other Ambulatory Visit: Payer: Self-pay | Admitting: Internal Medicine

## 2015-02-18 NOTE — Telephone Encounter (Signed)
Ok to refill each x 1, but she needs a f/u appt with me and fasting lab appt prior to appt.  Please schedule and then send in rx.

## 2015-02-18 NOTE — Telephone Encounter (Signed)
Last OV 12.31.15.  Please advise refill

## 2015-02-18 NOTE — Telephone Encounter (Signed)
Spoke with pt, she states she does not need either Rx at this time.  She also states she is only available on Wed afternoons, all day on Thursdays and Fridays for appoints.  Please advise where to schedule pt.

## 2015-02-19 NOTE — Telephone Encounter (Signed)
Can schedule her for 02/26/15 at 12:00.  See if can come in 02/25/15 for fasting labs.

## 2015-02-23 NOTE — Telephone Encounter (Signed)
Spoke with pt, scheduled appoints.

## 2015-02-24 ENCOUNTER — Telehealth: Payer: Self-pay | Admitting: *Deleted

## 2015-02-24 DIAGNOSIS — E039 Hypothyroidism, unspecified: Secondary | ICD-10-CM

## 2015-02-24 DIAGNOSIS — E7800 Pure hypercholesterolemia, unspecified: Secondary | ICD-10-CM

## 2015-02-24 DIAGNOSIS — E119 Type 2 diabetes mellitus without complications: Secondary | ICD-10-CM

## 2015-02-24 NOTE — Telephone Encounter (Signed)
Labs and dx?  

## 2015-02-24 NOTE — Telephone Encounter (Signed)
Orders placed for lab

## 2015-02-25 ENCOUNTER — Other Ambulatory Visit (INDEPENDENT_AMBULATORY_CARE_PROVIDER_SITE_OTHER): Payer: Commercial Managed Care - HMO

## 2015-02-25 ENCOUNTER — Other Ambulatory Visit: Payer: Commercial Managed Care - HMO

## 2015-02-25 DIAGNOSIS — E119 Type 2 diabetes mellitus without complications: Secondary | ICD-10-CM | POA: Diagnosis not present

## 2015-02-25 DIAGNOSIS — E7800 Pure hypercholesterolemia, unspecified: Secondary | ICD-10-CM

## 2015-02-25 DIAGNOSIS — E039 Hypothyroidism, unspecified: Secondary | ICD-10-CM

## 2015-02-25 LAB — LIPID PANEL
CHOL/HDL RATIO: 4
CHOLESTEROL: 198 mg/dL (ref 0–200)
HDL: 48.4 mg/dL (ref >39.00)
LDL Cholesterol: 123 mg/dL — ABNORMAL HIGH (ref 0–99)
NONHDL: 149.6
Triglycerides: 131 mg/dL (ref 0.0–149.0)
VLDL: 26.2 mg/dL (ref 0.0–40.0)

## 2015-02-25 LAB — HEPATIC FUNCTION PANEL
ALT: 15 U/L (ref 0–35)
AST: 15 U/L (ref 0–37)
Albumin: 4.6 g/dL (ref 3.5–5.2)
Alkaline Phosphatase: 69 U/L (ref 39–117)
BILIRUBIN DIRECT: 0 mg/dL (ref 0.0–0.3)
BILIRUBIN TOTAL: 0.3 mg/dL (ref 0.2–1.2)
Total Protein: 7.7 g/dL (ref 6.0–8.3)

## 2015-02-25 LAB — BASIC METABOLIC PANEL
BUN: 12 mg/dL (ref 6–23)
CO2: 23 mEq/L (ref 19–32)
CREATININE: 0.8 mg/dL (ref 0.40–1.20)
Calcium: 9.4 mg/dL (ref 8.4–10.5)
Chloride: 103 mEq/L (ref 96–112)
GFR: 75.5 mL/min (ref >60.00)
Glucose, Bld: 186 mg/dL — ABNORMAL HIGH (ref 70–99)
Potassium: 4.7 mEq/L (ref 3.5–5.1)
Sodium: 137 mEq/L (ref 135–145)

## 2015-02-25 LAB — HEMOGLOBIN A1C: Hgb A1c MFr Bld: 7.3 % — ABNORMAL HIGH (ref 4.6–6.5)

## 2015-02-25 LAB — TSH: TSH: 0.91 u[IU]/mL (ref 0.35–4.50)

## 2015-02-26 ENCOUNTER — Encounter: Payer: Self-pay | Admitting: Internal Medicine

## 2015-02-26 ENCOUNTER — Ambulatory Visit (INDEPENDENT_AMBULATORY_CARE_PROVIDER_SITE_OTHER): Payer: Commercial Managed Care - HMO | Admitting: Internal Medicine

## 2015-02-26 VITALS — BP 118/62 | HR 81 | Temp 98.2°F | Resp 14 | Ht 62.0 in | Wt 153.8 lb

## 2015-02-26 DIAGNOSIS — K219 Gastro-esophageal reflux disease without esophagitis: Secondary | ICD-10-CM | POA: Diagnosis not present

## 2015-02-26 DIAGNOSIS — E7800 Pure hypercholesterolemia, unspecified: Secondary | ICD-10-CM

## 2015-02-26 DIAGNOSIS — E119 Type 2 diabetes mellitus without complications: Secondary | ICD-10-CM

## 2015-02-26 DIAGNOSIS — R634 Abnormal weight loss: Secondary | ICD-10-CM

## 2015-02-26 DIAGNOSIS — E039 Hypothyroidism, unspecified: Secondary | ICD-10-CM

## 2015-02-26 DIAGNOSIS — Z0000 Encounter for general adult medical examination without abnormal findings: Secondary | ICD-10-CM

## 2015-02-26 DIAGNOSIS — F32A Depression, unspecified: Secondary | ICD-10-CM

## 2015-02-26 DIAGNOSIS — F329 Major depressive disorder, single episode, unspecified: Secondary | ICD-10-CM | POA: Diagnosis not present

## 2015-02-26 DIAGNOSIS — I6529 Occlusion and stenosis of unspecified carotid artery: Secondary | ICD-10-CM

## 2015-02-26 NOTE — Patient Instructions (Signed)
Coated baby aspirin 81mg  - one per day.

## 2015-02-26 NOTE — Progress Notes (Addendum)
Patient ID: Jody Wang, female   DOB: 03/21/1946, 69 y.o.   MRN: [id number]   Subjective:    Patient ID: Jody Wang, female    DOB: [birth year], 69 y.o.   MRN: [id number]  HPI  Patient here for a scheduled follow up.  Sugars averaging 140-160 in the am.  She does not eat until late am.  a1c just checked 7.3.  She has not been exercising.  Plans to start.  Discussed diet adjustment.  No chest pain or tightness.  Breathing stable.  No nausea or vomiting.  No bowel change.  Handling stress well.     Past Medical History  Diagnosis Date  . Diabetes mellitus without complication   . Hypercholesterolemia   . Depression   . Hypothyroidism   . Osteoarthritis of knee     Current Outpatient Prescriptions on File Prior to Visit  Medication Sig Dispense Refill  . ALPRAZolam (XANAX) 0.25 MG tablet TAKE 1 TABLET AT BEDTIME AS NEEDED FOR SLEEP 90 tablet 0  . FLUoxetine (PROZAC) 20 MG capsule TAKE 1 CAPSULE EVERY DAY 90 capsule 0  . levothyroxine (SYNTHROID, LEVOTHROID) 88 MCG tablet Take 1 tablet (88 mcg total) by mouth daily. 90 tablet 3  . meloxicam (MOBIC) 15 MG tablet TAKE 1 TABLET (15 MG TOTAL) EVERY DAY 90 tablet 0  . metFORMIN (GLUCOPHAGE) 500 MG tablet TAKE 2 TABLETS IN THE MORNING, TAKE 1 TABLET AT LUNCH,  AND TAKE 2 TABLETS EVERY EVENING 450 tablet 1  . mometasone (NASONEX) 50 MCG/ACT nasal spray Place 2 sprays into the nose daily.    . pantoprazole (PROTONIX) 40 MG tablet Take 1 tablet (40 mg total) by mouth daily. 90 tablet 3  . pravastatin (PRAVACHOL) 40 MG tablet Take 0.5 tablets (20 mg total) by mouth daily. 90 tablet 3  . sitaGLIPtin (JANUVIA) 100 MG tablet Take 1 tablet (100 mg total) by mouth daily. 30 tablet 0   Current Facility-Administered Medications on File Prior to Visit  Medication Dose Route Frequency Provider Last Rate Last Dose  . glucose blood test strip STRP 1 each  1 each Other PRN Einar Pheasant, MD        Review of Systems  Constitutional:  Negative for appetite change and unexpected weight change.  HENT: Negative for congestion and sinus pressure.   Respiratory: Negative for cough, chest tightness and shortness of breath.   Cardiovascular: Negative for chest pain, palpitations and leg swelling.  Gastrointestinal: Negative for nausea, vomiting, abdominal pain and diarrhea.  Neurological: Negative for dizziness, light-headedness and headaches.       Objective:    Physical Exam  Constitutional: She appears well-developed and well-nourished. No distress.  HENT:  Nose: Nose normal.  Mouth/Throat: Oropharynx is clear and moist.  Neck: Neck supple. No thyromegaly present.  Cardiovascular: Normal rate and regular rhythm.   Pulmonary/Chest: Breath sounds normal. No respiratory distress. She has no wheezes.  Abdominal: Soft. Bowel sounds are normal. There is no tenderness.  Musculoskeletal: She exhibits no edema or tenderness.  Lymphadenopathy:    She has no cervical adenopathy.  Psychiatric: She has a normal mood and affect. Her behavior is normal.    BP 118/62 mmHg  Pulse 81  Temp(Src) 98.2 F (36.8 C) (Oral)  Resp 14  Ht _0  (1.575 m)  Wt 153 lb 12.8 oz (69.763 kg)  BMI 28.12 kg/m2  SpO2 97% Wt Readings from Last 3 Encounters:  02/26/15 153 lb 12.8 oz (69.763 kg)  11/05/14 152 lb 8 oz (  69.174 kg)  06/16/14 152 lb 8 oz (69.174 kg)     Lab Results  Component Value Date   WBC 7.5 11/05/2014   HGB 12.2 11/05/2014   HCT 35.3* 11/05/2014   PLT 386 11/05/2014   GLUCOSE 186* 02/25/2015   CHOL 198 02/25/2015   TRIG 131.0 02/25/2015   HDL 48.40 02/25/2015   LDLDIRECT 188.8 06/30/2013   LDLCALC 123* 02/25/2015   ALT 15 02/25/2015   AST 15 02/25/2015   NA 137 02/25/2015   K 4.7 02/25/2015   CL 103 02/25/2015   CREATININE 0.80 02/25/2015   BUN 12 02/25/2015   CO2 23 02/25/2015   TSH 0.91 02/25/2015   HGBA1C 7.3* 02/25/2015   MICROALBUR <0.2 11/05/2014       Assessment & Plan:   Problem List Items  Addressed This Visit    Carotid stenosis    Some noted stenosis on carotid ultrasound.  Refer to vascular surgery for continued monitoring.  Start ecasa 31m q day.        Relevant Orders   Ambulatory referral to Vascular Surgery   Depression    Doing well on prozac.  Follow.        Diabetes mellitus    Sugars as outlined.  Eats late in am.  Discussed eating earlier and not going long periods without eating.  Continue same medication regimen.  Discussed diet and exercise.  She plans to exercise more.  Follow met b and a1c.        Relevant Orders   Hemoglobin AP7H  Basic metabolic panel   GERD (gastroesophageal reflux disease) - Primary    Controlled on protonix.  Follow.       Health care maintenance    Physical 06/16/14.  Mammogram 09/10/14 - Birads I.  Declines colonoscopy.        Hypercholesterolemia    Low cholesterol diet and exercise.  On pravastatin.  Follow lipid panel and liver function tests.  LDL just checked and improved - 123.  Follow.        Relevant Orders   Lipid panel   Hepatic function panel   Hypothyroidism    On thyroid replacement.  Follow tsh.        Weight loss    Weight stable.            SEinar Pheasant MD

## 2015-02-26 NOTE — Progress Notes (Signed)
Pre visit review using our clinic review tool, if applicable. No additional management support is needed unless otherwise documented below in the visit note. 

## 2015-02-27 ENCOUNTER — Encounter: Payer: Self-pay | Admitting: Internal Medicine

## 2015-02-27 DIAGNOSIS — I779 Disorder of arteries and arterioles, unspecified: Secondary | ICD-10-CM | POA: Insufficient documentation

## 2015-02-27 DIAGNOSIS — I6529 Occlusion and stenosis of unspecified carotid artery: Secondary | ICD-10-CM | POA: Insufficient documentation

## 2015-02-27 DIAGNOSIS — Z0000 Encounter for general adult medical examination without abnormal findings: Secondary | ICD-10-CM | POA: Insufficient documentation

## 2015-02-27 NOTE — Addendum Note (Signed)
Addended by: Alisa Graff on: 02/27/2015 02:25 PM   Modules accepted: Orders

## 2015-02-27 NOTE — Assessment & Plan Note (Signed)
Sugars as outlined.  Eats late in am.  Discussed eating earlier and not going long periods without eating.  Continue same medication regimen.  Discussed diet and exercise.  She plans to exercise more.  Follow met b and a1c.   

## 2015-02-27 NOTE — Assessment & Plan Note (Signed)
Low cholesterol diet and exercise.  On pravastatin.  Follow lipid panel and liver function tests.  LDL just checked and improved - 123.  Follow.

## 2015-02-27 NOTE — Assessment & Plan Note (Signed)
Weight stable.

## 2015-02-27 NOTE — Assessment & Plan Note (Signed)
On thyroid replacement.  Follow tsh.  

## 2015-02-27 NOTE — Assessment & Plan Note (Signed)
Controlled on protonix.  Follow.   

## 2015-02-27 NOTE — Assessment & Plan Note (Signed)
Doing well on prozac.  Follow  

## 2015-02-27 NOTE — Assessment & Plan Note (Signed)
Some noted stenosis on carotid ultrasound.  Refer to vascular surgery for continued monitoring.  Start ecasa 81mg  q day.

## 2015-02-27 NOTE — Assessment & Plan Note (Signed)
Physical 06/16/14.  Mammogram 09/10/14 - Birads I.  Declines colonoscopy.

## 2015-03-10 NOTE — Telephone Encounter (Signed)
Will sign when return to office and place in your box.

## 2015-03-10 NOTE — Telephone Encounter (Signed)
Received forms back from [employer] with letter stating that an outdated form what completed. A new form was attached. Pt aware of the situation & will come by the office tomorrow to sign the form. Form placed in blue folder for Dr. Bary Leriche signature first.

## 2015-03-11 NOTE — Telephone Encounter (Signed)
Form signed by Dr. Nicki Reaper & placed up front for patient to sign & return to me

## 2015-04-12 ENCOUNTER — Other Ambulatory Visit: Payer: Self-pay | Admitting: *Deleted

## 2015-04-12 MED ORDER — MELOXICAM 15 MG PO TABS: ORAL_TABLET | ORAL | Status: DC

## 2015-04-12 NOTE — Telephone Encounter (Signed)
Okay to refill? Please advise. Last seen on 02/26/15.

## 2015-04-13 MED ORDER — ALPRAZOLAM 0.25 MG PO TABS: 0.2500 mg | ORAL_TABLET | Freq: Every evening | ORAL | Status: DC | PRN

## 2015-04-13 NOTE — Telephone Encounter (Signed)
ok'd refill for xanax #90 with no refills.

## 2015-04-14 NOTE — Telephone Encounter (Signed)
Rx faxed

## 2015-04-15 ENCOUNTER — Telehealth: Payer: Self-pay | Admitting: *Deleted

## 2015-04-15 MED ORDER — LEVOTHYROXINE SODIUM 88 MCG PO TABS: 88.0000 ug | ORAL_TABLET | Freq: Every day | ORAL | Status: DC

## 2015-04-15 NOTE — Telephone Encounter (Signed)
I contacted patient after receiving a fax from [facility] for Blood glucose test strips. Patient stated that she did not request this from them & does not want them. Form was faxed back for denial of authorization.

## 2015-04-15 NOTE — Telephone Encounter (Signed)
Rx refill for Levothyroxine sent in per [facility] request

## 2015-04-19 ENCOUNTER — Other Ambulatory Visit: Payer: Self-pay | Admitting: Internal Medicine

## 2015-04-21 ENCOUNTER — Encounter: Payer: Self-pay | Admitting: Nurse Practitioner

## 2015-04-21 ENCOUNTER — Ambulatory Visit (INDEPENDENT_AMBULATORY_CARE_PROVIDER_SITE_OTHER): Payer: Commercial Managed Care - HMO | Admitting: Nurse Practitioner

## 2015-04-21 VITALS — BP 122/62 | HR 78 | Temp 97.1°F | Resp 16 | Ht 62.0 in | Wt 151.8 lb

## 2015-04-21 DIAGNOSIS — J069 Acute upper respiratory infection, unspecified: Secondary | ICD-10-CM | POA: Diagnosis not present

## 2015-04-21 DIAGNOSIS — B9789 Other viral agents as the cause of diseases classified elsewhere: Principal | ICD-10-CM

## 2015-04-21 NOTE — Progress Notes (Signed)
   Subjective:    Patient ID: Jody Wang, female    DOB: 07-10-1946, 69 y.o.   MRN: [id number]  HPI  Ms. Parchment is a 69 yo female with a cough, headache, and ear pain x 1 week.   1)  Headache, cough- dry, rhinorrhea-clear, temples painful, achy,  Nasonex- morning and night  Allergy medication- daily  No other treatment to date   Review of Systems  Constitutional: Positive for chills and fatigue. Negative for fever and diaphoresis.  HENT: Positive for congestion, postnasal drip, rhinorrhea, sinus pressure, sneezing and sore throat. Negative for ear discharge and ear pain.   Eyes: Positive for itching. Negative for pain and visual disturbance.  Respiratory: Positive for cough and chest tightness. Negative for shortness of breath and wheezing.   Gastrointestinal: Negative for nausea, vomiting and diarrhea.  Musculoskeletal: Positive for myalgias.  Skin: Negative for rash.  Neurological: Positive for headaches.      Objective:   Physical Exam  Constitutional: She is oriented to person, place, and time. She appears well-developed and well-nourished. No distress.  BP 122/62 mmHg  Pulse 78  Temp(Src) 97.1 F (36.2 C) (Oral)  Resp 16  Ht 5\' 2"  (1.575 m)  Wt 151 lb 12.8 oz (68.856 kg)  BMI 27.76 kg/m2  SpO2 95%   HENT:  Head: Normocephalic and atraumatic.  Right Ear: External ear normal.  Left Ear: External ear normal.  Mouth/Throat: No oropharyngeal exudate.  Eyes: EOM are normal. Pupils are equal, round, and reactive to light. Right eye exhibits no discharge. Left eye exhibits no discharge. No scleral icterus.  Neck: Normal range of motion. Neck supple.  Cardiovascular: Normal rate, regular rhythm, normal heart sounds and intact distal pulses.  Exam reveals no gallop and no friction rub.   No murmur heard. Pulmonary/Chest: Effort normal and breath sounds normal. No respiratory distress. She has no wheezes. She has no rales. She exhibits no tenderness.    Lymphadenopathy:    She has cervical adenopathy.  Neurological: She is alert and oriented to person, place, and time. No cranial nerve deficit. She exhibits normal muscle tone. Coordination normal.  Skin: Skin is warm and dry. No rash noted. She is not diaphoretic.  Psychiatric: She has a normal mood and affect. Her behavior is normal. Judgment and thought content normal.      Assessment & Plan:  Viral URI w/ cough  1) Coricidin HBP for sinus (changed from Sudafed, rewrote on pt AVS) 2) Mucinex OTC and Robitussin for cough 3) FU prn worsening/failure to improve.

## 2015-04-21 NOTE — Progress Notes (Signed)
Pre visit review using our clinic review tool, if applicable. No additional management support is needed unless otherwise documented below in the visit note. 

## 2015-04-21 NOTE — Patient Instructions (Addendum)
Sudafed PE (over the counter) for sinus headache and or Tylenol Mucinex plain over the counter (helps with mucous)  Robitussin over the counter for cough (can do Nyquil at night)

## 2015-05-07 DIAGNOSIS — I10 Essential (primary) hypertension: Secondary | ICD-10-CM | POA: Diagnosis not present

## 2015-05-07 DIAGNOSIS — E785 Hyperlipidemia, unspecified: Secondary | ICD-10-CM | POA: Diagnosis not present

## 2015-05-07 DIAGNOSIS — I6529 Occlusion and stenosis of unspecified carotid artery: Secondary | ICD-10-CM | POA: Diagnosis not present

## 2015-05-07 DIAGNOSIS — E119 Type 2 diabetes mellitus without complications: Secondary | ICD-10-CM | POA: Diagnosis not present

## 2015-05-12 ENCOUNTER — Other Ambulatory Visit: Payer: Self-pay | Admitting: Internal Medicine

## 2015-05-25 ENCOUNTER — Telehealth: Payer: Self-pay | Admitting: *Deleted

## 2015-05-25 NOTE — Telephone Encounter (Signed)
She would need to get the medication from MD prescribing.

## 2015-05-25 NOTE — Telephone Encounter (Signed)
Pt called requesting Volteran Gel for Arthritis pain.  Medication has never been prescribed by you.  Last OV 6.15.16.  Please advise

## 2015-05-26 NOTE — Telephone Encounter (Signed)
Spoke with pt, advised of MDs message.  Pt verbalized understanding 

## 2015-06-15 ENCOUNTER — Other Ambulatory Visit: Payer: Self-pay | Admitting: Internal Medicine

## 2015-06-17 ENCOUNTER — Other Ambulatory Visit: Payer: Self-pay | Admitting: Internal Medicine

## 2015-06-18 NOTE — Telephone Encounter (Signed)
Refilled prozac #90 with one refill.   

## 2015-06-18 NOTE — Telephone Encounter (Signed)
Last OV 6/16 ok to fill? 

## 2015-06-25 ENCOUNTER — Other Ambulatory Visit (INDEPENDENT_AMBULATORY_CARE_PROVIDER_SITE_OTHER): Payer: Commercial Managed Care - HMO

## 2015-06-25 DIAGNOSIS — E7800 Pure hypercholesterolemia, unspecified: Secondary | ICD-10-CM

## 2015-06-25 DIAGNOSIS — E119 Type 2 diabetes mellitus without complications: Secondary | ICD-10-CM | POA: Diagnosis not present

## 2015-06-25 LAB — LIPID PANEL
CHOL/HDL RATIO: 4
Cholesterol: 179 mg/dL (ref 0–200)
HDL: 45.1 mg/dL (ref >39.00)
LDL Cholesterol: 102 mg/dL — ABNORMAL HIGH (ref 0–99)
NONHDL: 134.14
Triglycerides: 159 mg/dL — ABNORMAL HIGH (ref 0.0–149.0)
VLDL: 31.8 mg/dL (ref 0.0–40.0)

## 2015-06-25 LAB — HEPATIC FUNCTION PANEL
ALT: 11 U/L (ref 0–35)
AST: 11 U/L (ref 0–37)
Albumin: 4.4 g/dL (ref 3.5–5.2)
Alkaline Phosphatase: 63 U/L (ref 39–117)
BILIRUBIN TOTAL: 0.3 mg/dL (ref 0.2–1.2)
Bilirubin, Direct: 0.1 mg/dL (ref 0.0–0.3)
Total Protein: 6.9 g/dL (ref 6.0–8.3)

## 2015-06-25 LAB — BASIC METABOLIC PANEL
BUN: 15 mg/dL (ref 6–23)
CALCIUM: 9.7 mg/dL (ref 8.4–10.5)
CHLORIDE: 102 meq/L (ref 96–112)
CO2: 26 mEq/L (ref 19–32)
Creatinine, Ser: 0.75 mg/dL (ref 0.40–1.20)
GFR: 81.27 mL/min (ref >60.00)
Glucose, Bld: 157 mg/dL — ABNORMAL HIGH (ref 70–99)
Potassium: 5 mEq/L (ref 3.5–5.1)
Sodium: 139 mEq/L (ref 135–145)

## 2015-06-25 LAB — HEMOGLOBIN A1C: HEMOGLOBIN A1C: 6.8 % — AB (ref 4.6–6.5)

## 2015-07-01 ENCOUNTER — Encounter: Payer: Commercial Managed Care - HMO | Admitting: Internal Medicine

## 2015-07-09 ENCOUNTER — Encounter: Payer: Self-pay | Admitting: Internal Medicine

## 2015-07-09 ENCOUNTER — Ambulatory Visit (INDEPENDENT_AMBULATORY_CARE_PROVIDER_SITE_OTHER): Payer: Commercial Managed Care - HMO | Admitting: Internal Medicine

## 2015-07-09 VITALS — BP 98/62 | HR 91 | Temp 98.0°F | Ht 62.0 in | Wt 150.0 lb

## 2015-07-09 DIAGNOSIS — Z23 Encounter for immunization: Secondary | ICD-10-CM | POA: Diagnosis not present

## 2015-07-09 DIAGNOSIS — E039 Hypothyroidism, unspecified: Secondary | ICD-10-CM | POA: Diagnosis not present

## 2015-07-09 DIAGNOSIS — F329 Major depressive disorder, single episode, unspecified: Secondary | ICD-10-CM

## 2015-07-09 DIAGNOSIS — E7800 Pure hypercholesterolemia, unspecified: Secondary | ICD-10-CM

## 2015-07-09 DIAGNOSIS — M25569 Pain in unspecified knee: Secondary | ICD-10-CM

## 2015-07-09 DIAGNOSIS — I6529 Occlusion and stenosis of unspecified carotid artery: Secondary | ICD-10-CM

## 2015-07-09 DIAGNOSIS — K219 Gastro-esophageal reflux disease without esophagitis: Secondary | ICD-10-CM | POA: Diagnosis not present

## 2015-07-09 DIAGNOSIS — R634 Abnormal weight loss: Secondary | ICD-10-CM

## 2015-07-09 DIAGNOSIS — M25511 Pain in right shoulder: Secondary | ICD-10-CM

## 2015-07-09 DIAGNOSIS — F32A Depression, unspecified: Secondary | ICD-10-CM

## 2015-07-09 DIAGNOSIS — E119 Type 2 diabetes mellitus without complications: Secondary | ICD-10-CM

## 2015-07-09 MED ORDER — CETIRIZINE HCL 10 MG PO TABS: 10.0000 mg | ORAL_TABLET | Freq: Every day | ORAL | Status: DC

## 2015-07-09 MED ORDER — DICLOFENAC SODIUM 1 % TD GEL: 2.0000 g | Freq: Two times a day (BID) | TRANSDERMAL | Status: DC

## 2015-07-09 NOTE — Progress Notes (Signed)
Pre-visit discussion using our clinic review tool. No additional management support is needed unless otherwise documented below in the visit note.  

## 2015-07-09 NOTE — Progress Notes (Signed)
Patient ID: Jody Wang, female   DOB: [birth year], 70 y.o.   MRN: [id number]   Subjective:    Patient ID: Jody Wang, female    DOB: 1946-08-27, 69 y.o.   MRN: [id number]  HPI  Patient here for a scheduled follow up.   She has been trying to adjust her diet.  Watching what she eats.  Not able to be as active secondary to knee pain.  She request to have a trial of voltaren gel.  Wants to see if this helps her knee pain - to be more active.  She has been having right shoulder pain.  Saw Dr Sabra Heck.  Had injection.  No change.  No cardiac symptoms with increased activity or exertion.  No sob.  No acid reflux.  Overall feels better.  Increased stress - feels handling things relatively well.  Bowels stable.    Past Medical History  Diagnosis Date  . Diabetes mellitus without complication   . Hypercholesterolemia   . Depression   . Hypothyroidism   . Osteoarthritis of knee    Past Surgical History  Procedure Laterality Date  . Abdominal hysterectomy  1985    ovaries not removed  . Inner ear surgery     Family History  Problem Relation Age of Onset  . Diabetes Father   . Coronary artery disease Father     s/p CABG  . Hypertension Father   . Hypercholesterolemia Father   . Hypertension Brother   . Hypertension Sister   . Glaucoma Sister   . Diabetes      niece  . Breast cancer Maternal Aunt   . Breast cancer Paternal Aunt   . Breast cancer      maternal cousin  . Cervical cancer Paternal Aunt    Social History   Social History  . Marital Status: Divorced    Spouse Name: N/A  . Number of Children: 3  . Years of Education: N/A   Social History Main Topics  . Smoking status: Former Smoker -- 1.00 packs/day for 23 years    Quit date: 11/06/1981  . Smokeless tobacco: Never Used  . Alcohol Use: 0.0 oz/week    0 Standard drinks or equivalent per week     Comment: rare  . Drug Use: No  . Sexual Activity: Not Asked   Other Topics Concern  . None   Social  History Narrative    Outpatient Encounter Prescriptions as of 07/09/2015  Medication Sig  . ALPRAZolam (XANAX) 0.25 MG tablet Take 1 tablet (0.25 mg total) by mouth at bedtime as needed. for sleep  . FLUoxetine (PROZAC) 20 MG capsule TAKE 1 CAPSULE EVERY DAY  . levothyroxine (SYNTHROID, LEVOTHROID) 88 MCG tablet Take 1 tablet (88 mcg total) by mouth daily.  . meloxicam (MOBIC) 15 MG tablet TAKE 1 TABLET EVERY DAY  . metFORMIN (GLUCOPHAGE) 500 MG tablet TAKE 2 TABLETS IN THE MORNING, TAKE 1 TABLET AT LUNCH,  AND TAKE 2 TABLETS EVERY EVENING  . mometasone (NASONEX) 50 MCG/ACT nasal spray Place 2 sprays into the nose daily.  . pantoprazole (PROTONIX) 40 MG tablet Take 1 tablet (40 mg total) by mouth daily.  . pravastatin (PRAVACHOL) 40 MG tablet Take 0.5 tablets (20 mg total) by mouth daily.  . sitaGLIPtin (JANUVIA) 100 MG tablet Take 1 tablet (100 mg total) by mouth daily.  . cetirizine (ZYRTEC) 10 MG tablet Take 1 tablet (10 mg total) by mouth daily.  . diclofenac sodium (VOLTAREN) 1 % GEL Apply  2 g topically 2 (two) times daily.   Facility-Administered Encounter Medications as of 07/09/2015  Medication  . glucose blood test strip STRP 1 each    Review of Systems  Constitutional: Negative for appetite change and unexpected weight change.  HENT: Negative for congestion and sinus pressure.   Eyes: Negative for discharge and visual disturbance.  Respiratory: Negative for cough, chest tightness and shortness of breath.   Cardiovascular: Negative for chest pain and palpitations.  Gastrointestinal: Negative for nausea, vomiting, abdominal pain and diarrhea.  Genitourinary: Negative for dysuria and difficulty urinating.  Musculoskeletal:       Knee pain as outlined.  Some persistent shoulder pain and some limited rom.    Skin: Negative for color change and rash.  Neurological: Negative for dizziness, light-headedness and headaches.  Psychiatric/Behavioral: Negative for dysphoric mood and  agitation.       Objective:     Blood pressure rechecked by me:  120/62  Physical Exam  Constitutional: She appears well-developed and well-nourished. No distress.  HENT:  Nose: Nose normal.  Mouth/Throat: Oropharynx is clear and moist.  Eyes: Conjunctivae are normal. Right eye exhibits no discharge. Left eye exhibits no discharge.  Neck: Neck supple. No thyromegaly present.  Cardiovascular: Normal rate and regular rhythm.   Pulmonary/Chest: Breath sounds normal. No respiratory distress. She has no wheezes.  Abdominal: Soft. Bowel sounds are normal. There is no tenderness.  Musculoskeletal: She exhibits no edema or tenderness.  Lymphadenopathy:    She has no cervical adenopathy.  Skin: No rash noted. No erythema.  Psychiatric: She has a normal mood and affect. Her behavior is normal.    BP 98/62 mmHg  Pulse 91  Temp(Src) 98 F (36.7 C) (Oral)  Ht 5' 2"  (1.575 m)  Wt 150 lb (68.04 kg)  BMI 27.43 kg/m2  SpO2 96% Wt Readings from Last 3 Encounters:  07/09/15 150 lb (68.04 kg)  04/21/15 151 lb 12.8 oz (68.856 kg)  02/26/15 153 lb 12.8 oz (69.763 kg)     Lab Results  Component Value Date   WBC 7.5 11/05/2014   HGB 12.2 11/05/2014   HCT 35.3* 11/05/2014   PLT 386 11/05/2014   GLUCOSE 157* 06/25/2015   CHOL 179 06/25/2015   TRIG 159.0* 06/25/2015   HDL 45.10 06/25/2015   LDLDIRECT 188.8 06/30/2013   LDLCALC 102* 06/25/2015   ALT 11 06/25/2015   AST 11 06/25/2015   NA 139 06/25/2015   K 5.0 06/25/2015   CL 102 06/25/2015   CREATININE 0.75 06/25/2015   BUN 15 06/25/2015   CO2 26 06/25/2015   TSH 0.91 02/25/2015   HGBA1C 6.8* 06/25/2015   MICROALBUR <0.2 11/05/2014       Assessment & Plan:   Problem List Items Addressed This Visit    Carotid stenosis    Had some stenosis on carotid ultrasound.  Referred to vascular surgery.  Has planned f/u in 08/2015.  Continue daily aspirin.        Depression    Doing better.  On prozac.  Follow.       Diabetes  mellitus    Sugars reviewed.  Improved.  Recent a1c 6.8.  Diet and exercise.  Follow met b and a1c.       Relevant Orders   Hemoglobin M0Q   Basic metabolic panel   CBC with Differential/Platelet   GERD (gastroesophageal reflux disease)    Controlled on protonix.        Hypercholesterolemia    On pravastatin.  Low  cholesterol diet and exercise.  LDL 102.  Improved.        Relevant Orders   Lipid panel   Hepatic function panel   Hypothyroidism    On thyroid replacement.  Follow tsh.       Knee pain    Persistent.  Wants a trial of voltaren gel.  rx sent in.  Follow.  Discussed possible side effects and risk of medication.  Follow.       Right shoulder pain    Saw Dr Sabra Heck.  S/p injection.  Follow.       Weight loss    Weight has stabilized.  She has adjusted her diet.  Feeling better.  Follow.        Other Visit Diagnoses    Encounter for immunization    -  Primary        Einar Pheasant, MD

## 2015-07-11 ENCOUNTER — Encounter: Payer: Self-pay | Admitting: Internal Medicine

## 2015-07-11 DIAGNOSIS — M25569 Pain in unspecified knee: Secondary | ICD-10-CM | POA: Insufficient documentation

## 2015-07-11 NOTE — Assessment & Plan Note (Signed)
Controlled on protonix.   

## 2015-07-11 NOTE — Assessment & Plan Note (Signed)
Weight has stabilized.  She has adjusted her diet.  Feeling better.  Follow.

## 2015-07-11 NOTE — Assessment & Plan Note (Signed)
Had some stenosis on carotid ultrasound.  Referred to vascular surgery.  Has planned f/u in 08/2015.  Continue daily aspirin.

## 2015-07-11 NOTE — Assessment & Plan Note (Signed)
Doing better.  On prozac.  Follow.   

## 2015-07-11 NOTE — Assessment & Plan Note (Signed)
Saw Dr Sabra Heck.  S/p injection.  Follow.

## 2015-07-11 NOTE — Assessment & Plan Note (Signed)
On pravastatin.  Low cholesterol diet and exercise.  LDL 102.  Improved.

## 2015-07-11 NOTE — Assessment & Plan Note (Signed)
Sugars reviewed.  Improved.  Recent a1c 6.8.  Diet and exercise.  Follow met b and a1c.

## 2015-07-11 NOTE — Assessment & Plan Note (Signed)
Persistent.  Wants a trial of voltaren gel.  rx sent in.  Follow.  Discussed possible side effects and risk of medication.  Follow.

## 2015-07-11 NOTE — Assessment & Plan Note (Signed)
On thyroid replacement.  Follow tsh.  

## 2015-07-13 ENCOUNTER — Telehealth: Payer: Self-pay | Admitting: *Deleted

## 2015-07-13 DIAGNOSIS — Z1239 Encounter for other screening for malignant neoplasm of breast: Secondary | ICD-10-CM

## 2015-07-13 NOTE — Telephone Encounter (Signed)
Mammogram ordered

## 2015-07-14 ENCOUNTER — Telehealth: Payer: Self-pay | Admitting: *Deleted

## 2015-07-14 NOTE — Telephone Encounter (Signed)
I contacted patient & notified her that we received a fax form from [facility] for Diabetic testing supplies & pt states that she did not request anything from this company.

## 2015-08-13 DIAGNOSIS — I10 Essential (primary) hypertension: Secondary | ICD-10-CM | POA: Diagnosis not present

## 2015-08-13 DIAGNOSIS — E119 Type 2 diabetes mellitus without complications: Secondary | ICD-10-CM | POA: Diagnosis not present

## 2015-08-13 DIAGNOSIS — I6529 Occlusion and stenosis of unspecified carotid artery: Secondary | ICD-10-CM | POA: Diagnosis not present

## 2015-08-13 DIAGNOSIS — I6523 Occlusion and stenosis of bilateral carotid arteries: Secondary | ICD-10-CM | POA: Diagnosis not present

## 2015-08-13 DIAGNOSIS — E785 Hyperlipidemia, unspecified: Secondary | ICD-10-CM | POA: Diagnosis not present

## 2015-08-23 ENCOUNTER — Telehealth: Payer: Self-pay | Admitting: *Deleted

## 2015-08-23 ENCOUNTER — Other Ambulatory Visit: Payer: Self-pay

## 2015-08-23 NOTE — Telephone Encounter (Signed)
Left Message for patient to clarify.

## 2015-08-23 NOTE — Telephone Encounter (Signed)
Patient has requested a medication Reill for Jody Wang  [phone] [facility]

## 2015-08-25 ENCOUNTER — Telehealth: Payer: Self-pay | Admitting: *Deleted

## 2015-08-25 ENCOUNTER — Other Ambulatory Visit: Payer: Self-pay

## 2015-08-25 MED ORDER — LEVOTHYROXINE SODIUM 88 MCG PO TABS: 88.0000 ug | ORAL_TABLET | Freq: Every day | ORAL | Status: DC

## 2015-08-25 MED ORDER — SITAGLIPTIN PHOSPHATE 100 MG PO TABS: 100.0000 mg | ORAL_TABLET | Freq: Every day | ORAL | Status: DC

## 2015-08-25 NOTE — Telephone Encounter (Signed)
Patient has requested a medication refill for junuvia at [facility] [phone] or [phone]   Patient also needed a Rx for levothyroxine, this Rx goes to [place] or [phone]

## 2015-08-25 NOTE — Telephone Encounter (Signed)
Refilled

## 2015-08-25 NOTE — Telephone Encounter (Signed)
See prior note

## 2015-08-26 ENCOUNTER — Other Ambulatory Visit: Payer: Self-pay | Admitting: *Deleted

## 2015-08-26 NOTE — Telephone Encounter (Signed)
Opened in error. See 08/25/15 phone note

## 2015-08-26 NOTE — Telephone Encounter (Signed)
Rx for Januvia is not needed per [employer]). Pt not due for refill until 09/20/15. Left message on pt voicemail for her to contact [employer] if she has any questions

## 2015-09-13 ENCOUNTER — Ambulatory Visit: Payer: Commercial Managed Care - HMO

## 2015-09-16 ENCOUNTER — Other Ambulatory Visit: Payer: Self-pay | Admitting: Internal Medicine

## 2015-09-16 ENCOUNTER — Ambulatory Visit
Admission: RE | Admit: 2015-09-16 | Discharge: 2015-09-16 | Disposition: A | Discharge status: Home / Self Care | Payer: Commercial Managed Care - HMO | Source: Ambulatory Visit | Attending: Internal Medicine | Admitting: Internal Medicine

## 2015-09-16 DIAGNOSIS — Z1239 Encounter for other screening for malignant neoplasm of breast: Secondary | ICD-10-CM

## 2015-09-16 DIAGNOSIS — Z1231 Encounter for screening mammogram for malignant neoplasm of breast: Secondary | ICD-10-CM | POA: Diagnosis not present

## 2015-09-20 ENCOUNTER — Other Ambulatory Visit: Payer: Self-pay | Admitting: Internal Medicine

## 2015-10-04 ENCOUNTER — Other Ambulatory Visit: Payer: Self-pay | Admitting: Internal Medicine

## 2015-10-05 ENCOUNTER — Other Ambulatory Visit (INDEPENDENT_AMBULATORY_CARE_PROVIDER_SITE_OTHER): Payer: Commercial Managed Care - HMO

## 2015-10-05 DIAGNOSIS — E119 Type 2 diabetes mellitus without complications: Secondary | ICD-10-CM

## 2015-10-05 DIAGNOSIS — E7800 Pure hypercholesterolemia, unspecified: Secondary | ICD-10-CM

## 2015-10-05 LAB — CBC WITH DIFFERENTIAL/PLATELET
BASOS ABS: 0 10*3/uL (ref 0.0–0.1)
BASOS PCT: 0.5 % (ref 0.0–3.0)
EOS ABS: 0.4 10*3/uL (ref 0.0–0.7)
Eosinophils Relative: 4.7 % (ref 0.0–5.0)
HEMATOCRIT: 37.3 % (ref 36.0–46.0)
HEMOGLOBIN: 12.4 g/dL (ref 12.0–15.0)
LYMPHS PCT: 25.4 % (ref 12.0–46.0)
Lymphs Abs: 1.9 10*3/uL (ref 0.7–4.0)
MCHC: 33.1 g/dL (ref 30.0–36.0)
MCV: 86.5 fl (ref 78.0–100.0)
Monocytes Absolute: 0.5 10*3/uL (ref 0.1–1.0)
Monocytes Relative: 5.9 % (ref 3.0–12.0)
Neutro Abs: 4.9 10*3/uL (ref 1.4–7.7)
Neutrophils Relative %: 63.5 % (ref 43.0–77.0)
Platelets: 407 10*3/uL — ABNORMAL HIGH (ref 150.0–400.0)
RBC: 4.31 Mil/uL (ref 3.87–5.11)
RDW: 13.6 % (ref 11.5–15.5)
WBC: 7.7 10*3/uL (ref 4.0–10.5)

## 2015-10-05 LAB — BASIC METABOLIC PANEL
BUN: 16 mg/dL (ref 6–23)
CHLORIDE: 104 meq/L (ref 96–112)
CO2: 22 mEq/L (ref 19–32)
CREATININE: 0.77 mg/dL (ref 0.40–1.20)
Calcium: 9.3 mg/dL (ref 8.4–10.5)
GFR: 78.77 mL/min (ref >60.00)
Glucose, Bld: 150 mg/dL — ABNORMAL HIGH (ref 70–99)
POTASSIUM: 4.7 meq/L (ref 3.5–5.1)
SODIUM: 138 meq/L (ref 135–145)

## 2015-10-05 LAB — HEPATIC FUNCTION PANEL
ALBUMIN: 4.3 g/dL (ref 3.5–5.2)
ALT: 10 U/L (ref 0–35)
AST: 11 U/L (ref 0–37)
Alkaline Phosphatase: 57 U/L (ref 39–117)
Bilirubin, Direct: 0.1 mg/dL (ref 0.0–0.3)
Total Bilirubin: 0.3 mg/dL (ref 0.2–1.2)
Total Protein: 6.7 g/dL (ref 6.0–8.3)

## 2015-10-05 LAB — LIPID PANEL
CHOL/HDL RATIO: 4
Cholesterol: 185 mg/dL (ref 0–200)
HDL: 42.4 mg/dL (ref >39.00)
LDL CALC: 118 mg/dL — AB (ref 0–99)
NONHDL: 142.35
Triglycerides: 121 mg/dL (ref 0.0–149.0)
VLDL: 24.2 mg/dL (ref 0.0–40.0)

## 2015-10-05 LAB — HEMOGLOBIN A1C: HEMOGLOBIN A1C: 6.9 % — AB (ref 4.6–6.5)

## 2015-10-08 ENCOUNTER — Encounter: Payer: Self-pay | Admitting: Internal Medicine

## 2015-10-08 ENCOUNTER — Ambulatory Visit (INDEPENDENT_AMBULATORY_CARE_PROVIDER_SITE_OTHER): Payer: Commercial Managed Care - HMO | Admitting: Internal Medicine

## 2015-10-08 VITALS — BP 122/80 | HR 86 | Temp 97.9°F | Resp 18 | Ht 62.0 in | Wt 147.2 lb

## 2015-10-08 DIAGNOSIS — E039 Hypothyroidism, unspecified: Secondary | ICD-10-CM

## 2015-10-08 DIAGNOSIS — Z23 Encounter for immunization: Secondary | ICD-10-CM | POA: Diagnosis not present

## 2015-10-08 DIAGNOSIS — Z0000 Encounter for general adult medical examination without abnormal findings: Secondary | ICD-10-CM | POA: Diagnosis not present

## 2015-10-08 DIAGNOSIS — I6529 Occlusion and stenosis of unspecified carotid artery: Secondary | ICD-10-CM

## 2015-10-08 DIAGNOSIS — Z91048 Other nonmedicinal substance allergy status: Secondary | ICD-10-CM

## 2015-10-08 DIAGNOSIS — K219 Gastro-esophageal reflux disease without esophagitis: Secondary | ICD-10-CM | POA: Diagnosis not present

## 2015-10-08 DIAGNOSIS — E7800 Pure hypercholesterolemia, unspecified: Secondary | ICD-10-CM

## 2015-10-08 DIAGNOSIS — M25511 Pain in right shoulder: Secondary | ICD-10-CM

## 2015-10-08 DIAGNOSIS — Z9109 Other allergy status, other than to drugs and biological substances: Secondary | ICD-10-CM

## 2015-10-08 DIAGNOSIS — E119 Type 2 diabetes mellitus without complications: Secondary | ICD-10-CM

## 2015-10-08 DIAGNOSIS — F329 Major depressive disorder, single episode, unspecified: Secondary | ICD-10-CM

## 2015-10-08 DIAGNOSIS — F32A Depression, unspecified: Secondary | ICD-10-CM

## 2015-10-08 MED ORDER — MELOXICAM 15 MG PO TABS: 15.0000 mg | ORAL_TABLET | Freq: Every day | ORAL | Status: DC

## 2015-10-08 MED ORDER — SITAGLIPTIN PHOSPHATE 100 MG PO TABS: 100.0000 mg | ORAL_TABLET | Freq: Every day | ORAL | Status: DC

## 2015-10-08 MED ORDER — PANTOPRAZOLE SODIUM 40 MG PO TBEC: 40.0000 mg | DELAYED_RELEASE_TABLET | Freq: Every day | ORAL | Status: DC

## 2015-10-08 NOTE — Progress Notes (Signed)
Patient ID: QUINTINA HAKEEM, female   DOB: Jul 11, 1946, 69 y.o.   MRN: [id number]   Subjective:    Patient ID: DAILYN REITH, female    DOB: [birth year], 69 y.o.   MRN: [id number]  HPI  Patient with past history of hypercholesterolemia, diabetes, hypothyroidism and OA.  She comes in today to follow up on these issues as well as for a complete physical exam.  She is doing well.  Staying active.  No cardiac symptoms with increased activity or exertion.  No sob.  No acid reflux.  No abdominal pain or cramping.  Bowels stable.  Still with right shoulder pain.  Taking meloxicam.  Does not feel she can function without the meloxicam.  Some limited rom.  Still with pain.  Desires no further intervention.  Sugars averaging 130-160s.     Past Medical History  Diagnosis Date  . Diabetes mellitus without complication ([place])   . Hypercholesterolemia   . Depression   . Hypothyroidism   . Osteoarthritis of knee    Past Surgical History  Procedure Laterality Date  . Abdominal hysterectomy  1985    ovaries not removed  . Inner ear surgery    . Breast cyst excision Right   . Breast cyst excision Left    Family History  Problem Relation Age of Onset  . Diabetes Father   . Coronary artery disease Father     s/p CABG  . Hypertension Father   . Hypercholesterolemia Father   . Hypertension Brother   . Hypertension Sister   . Glaucoma Sister   . Diabetes      niece  . Breast cancer Maternal Aunt   . Breast cancer Paternal Aunt   . Cervical cancer Paternal Aunt   . Breast cancer Cousin    Social History   Social History  . Marital Status: Divorced    Spouse Name: N/A  . Number of Children: 3  . Years of Education: N/A   Social History Main Topics  . Smoking status: Former Smoker -- 1.00 packs/day for 23 years    Quit date: 11/06/1981  . Smokeless tobacco: Never Used  . Alcohol Use: 0.0 oz/week    0 Standard drinks or equivalent per week     Comment: rare  . Drug Use: No  .  Sexual Activity: Not Asked   Other Topics Concern  . None   Social History Narrative    Outpatient Encounter Prescriptions as of 10/08/2015  Medication Sig  . ALPRAZolam (XANAX) 0.25 MG tablet TAKE 1 TABLET AT BEDTIME AS NEEDED FOR SLEEP  . cetirizine (ZYRTEC) 10 MG tablet Take 1 tablet (10 mg total) by mouth daily.  . diclofenac sodium (VOLTAREN) 1 % GEL Apply 2 g topically 2 (two) times daily.  Marland Kitchen FLUoxetine (PROZAC) 20 MG capsule TAKE 1 CAPSULE EVERY DAY  . levothyroxine (SYNTHROID, LEVOTHROID) 88 MCG tablet Take 1 tablet (88 mcg total) by mouth daily.  . meloxicam (MOBIC) 15 MG tablet Take 1 tablet (15 mg total) by mouth daily.  . metFORMIN (GLUCOPHAGE) 500 MG tablet TAKE 2 TABLETS EVERY MORNING, 1 TABLET AT LUNCH, AND 2 TABLETS EVERY EVENING  . mometasone (NASONEX) 50 MCG/ACT nasal spray Place 2 sprays into the nose daily.  . pantoprazole (PROTONIX) 40 MG tablet Take 1 tablet (40 mg total) by mouth daily.  . pravastatin (PRAVACHOL) 40 MG tablet Take 0.5 tablets (20 mg total) by mouth daily.  . sitaGLIPtin (JANUVIA) 100 MG tablet Take 1 tablet (100 mg total)  by mouth daily.  . [DISCONTINUED] meloxicam (MOBIC) 15 MG tablet TAKE 1 TABLET EVERY DAY  . [DISCONTINUED] pantoprazole (PROTONIX) 40 MG tablet Take 1 tablet (40 mg total) by mouth daily.  . [DISCONTINUED] sitaGLIPtin (JANUVIA) 100 MG tablet Take 1 tablet (100 mg total) by mouth daily.   Facility-Administered Encounter Medications as of 10/08/2015  Medication  . glucose blood test strip STRP 1 each    Review of Systems  Constitutional: Negative for appetite change and unexpected weight change.  HENT: Negative for congestion and sinus pressure.   Eyes: Negative for pain and visual disturbance.  Respiratory: Negative for cough, chest tightness and shortness of breath.   Cardiovascular: Negative for chest pain, palpitations and leg swelling.  Gastrointestinal: Negative for nausea, vomiting, abdominal pain and diarrhea.    Genitourinary: Negative for dysuria and difficulty urinating.  Musculoskeletal: Negative for back pain and joint swelling.       Right shoulder pain - taking meloxicam.   Skin: Negative for color change and rash.  Neurological: Negative for dizziness, light-headedness and headaches.  Hematological: Negative for adenopathy. Does not bruise/bleed easily.  Psychiatric/Behavioral: Negative for dysphoric mood and agitation.       Objective:     Blood pressure rechecked by me:  128/78  Physical Exam  Constitutional: She is oriented to person, place, and time. She appears well-developed and well-nourished. No distress.  HENT:  Nose: Nose normal.  Mouth/Throat: Oropharynx is clear and moist.  Eyes: Right eye exhibits no discharge. Left eye exhibits no discharge. No scleral icterus.  Neck: Neck supple. No thyromegaly present.  Cardiovascular: Normal rate and regular rhythm.   Pulmonary/Chest: Breath sounds normal. No accessory muscle usage. No tachypnea. No respiratory distress. She has no decreased breath sounds. She has no wheezes. She has no rhonchi. Right breast exhibits no inverted nipple, no mass, no nipple discharge and no tenderness (no axillary adenopathy). Left breast exhibits no inverted nipple, no mass, no nipple discharge and no tenderness (no axilarry adenopathy).  Abdominal: Soft. Bowel sounds are normal. There is no tenderness.  Musculoskeletal: She exhibits no edema or tenderness.  Lymphadenopathy:    She has no cervical adenopathy.  Neurological: She is alert and oriented to person, place, and time.  Skin: Skin is warm. No rash noted. No erythema.  Psychiatric: She has a normal mood and affect. Her behavior is normal.    BP 122/80 mmHg  Pulse 86  Temp(Src) 97.9 F (36.6 C) (Oral)  Resp 18  Ht 5' 2"  (1.575 m)  Wt 147 lb 4 oz (66.792 kg)  BMI 26.93 kg/m2  SpO2 95% Wt Readings from Last 3 Encounters:  10/08/15 147 lb 4 oz (66.792 kg)  07/09/15 150 lb (68.04 kg)   04/21/15 151 lb 12.8 oz (68.856 kg)     Lab Results  Component Value Date   WBC 7.7 10/05/2015   HGB 12.4 10/05/2015   HCT 37.3 10/05/2015   PLT 407.0* 10/05/2015   GLUCOSE 150* 10/05/2015   CHOL 185 10/05/2015   TRIG 121.0 10/05/2015   HDL 42.40 10/05/2015   LDLDIRECT 188.8 06/30/2013   LDLCALC 118* 10/05/2015   ALT 10 10/05/2015   AST 11 10/05/2015   NA 138 10/05/2015   K 4.7 10/05/2015   CL 104 10/05/2015   CREATININE 0.77 10/05/2015   BUN 16 10/05/2015   CO2 22 10/05/2015   TSH 0.91 02/25/2015   HGBA1C 6.9* 10/05/2015   MICROALBUR <0.2 11/05/2014    Mm Screening Breast Tomo Bilateral  09/16/2015  CLINICAL DATA:  Screening. EXAM: DIGITAL SCREENING BILATERAL MAMMOGRAM WITH 3D TOMO WITH CAD COMPARISON:  Previous exam(s). ACR Breast Density Category c: The breast tissue is heterogeneously dense, which may obscure small masses. FINDINGS: There are no findings suspicious for malignancy. Images were processed with CAD. IMPRESSION: No mammographic evidence of malignancy. A result letter of this screening mammogram will be mailed directly to the patient. RECOMMENDATION: Screening mammogram in one year. (Code:SM-B-01Y) BI-RADS CATEGORY  1: Negative. Electronically Signed   By: Ammie Ferrier M.D.   On: 09/16/2015 15:32       Assessment & Plan:   Problem List Items Addressed This Visit    Carotid stenosis    Had some stenosis on carotid ultrasound.  Seeing vascular surgery.  Just evaluated 08/2015.  States stable.  They are continuing to follow.  Continue daily aspirin.        Depression    On prozac and doing well.  Follow.        Relevant Orders   CBC with Differential/Platelet   Diabetes mellitus ([place])    Sugars as outlined.  Improved.  a1c 6.9.  Low carb diet and exercise.  Follow met b and a1c.   Lab Results  Component Value Date   HGBA1C 6.9* 10/05/2015        Relevant Medications   sitaGLIPtin (JANUVIA) 100 MG tablet   Other Relevant Orders   Hemoglobin  A5W   Basic metabolic panel   Microalbumin / creatinine urine ratio   Environmental allergies    Controlled.        GERD (gastroesophageal reflux disease)    Controlled on protonix.        Relevant Medications   pantoprazole (PROTONIX) 40 MG tablet   Health care maintenance    Physical today 10/08/15.  Mammogram 09/16/15 - Birads I.  Declines colonoscopy.  Agrees to cologuard.        Hypercholesterolemia    On pravastatin.  Low cholesterol diet and exercise.  Follow  Lipid panel and liver function tests.        Relevant Orders   Lipid panel   Hepatic function panel   Hypothyroidism    On thyroid replacement.  Follow tsh.        Relevant Orders   TSH   Right shoulder pain    Persistent right shoulder pain.  On meloxicam.  Seeing Dr Sabra Heck.  Desires no further intervention at this time.  Follow.         Other Visit Diagnoses    Routine general medical examination at a health care facility    -  Primary    Need for prophylactic vaccination against Streptococcus pneumoniae (pneumococcus)        Relevant Orders    Pneumococcal conjugate vaccine 13-valent (Completed)        Einar Pheasant, MD

## 2015-10-08 NOTE — Patient Instructions (Signed)
Pneumococcal Vaccine, Polyvalent suspension for injection  What is this medicine?  PNEUMOCOCCAL VACCINE (NEU mo KOK al vak SEEN) is a vaccine used to prevent pneumococcus bacterial infections. These bacteria can cause serious infections like pneumonia, meningitis, and blood infections. This vaccine will lower your chance of getting pneumonia. If you do get pneumonia, it can make your symptoms milder and your illness shorter. This vaccine will not treat an infection and will not cause infection. This vaccine is recommended for infants and young children, adults with certain medical conditions, and adults 65 years or older.  This medicine may be used for other purposes; ask your health care provider or pharmacist if you have questions.  What should I tell my health care provider before I take this medicine?  They need to know if you have any of these conditions:  -bleeding problems  -fever  -immune system problems  -an unusual or allergic reaction to pneumococcal vaccine, diphtheria toxoid, other vaccines, latex, other medicines, foods, dyes, or preservatives  -pregnant or trying to get pregnant  -breast-feeding  How should I use this medicine?  This vaccine is for injection into a muscle. It is given by a health care professional.  A copy of Vaccine Information Statements will be given before each vaccination. Read this sheet carefully each time. The sheet may change frequently.  Talk to your pediatrician regarding the use of this medicine in children. While this drug may be prescribed for children as young as 6 weeks old for selected conditions, precautions do apply.  Overdosage: If you think you have taken too much of this medicine contact a poison control center or emergency room at once.  NOTE: This medicine is only for you. Do not share this medicine with others.  What if I miss a dose?  It is important not to miss your dose. Call your doctor or health care professional if you are unable to keep an  appointment.  What may interact with this medicine?  -medicines for cancer chemotherapy  -medicines that suppress your immune function  -steroid medicines like prednisone or cortisone  This list may not describe all possible interactions. Give your health care provider a list of all the medicines, herbs, non-prescription drugs, or dietary supplements you use. Also tell them if you smoke, drink alcohol, or use illegal drugs. Some items may interact with your medicine.  What should I watch for while using this medicine?  Mild fever and pain should go away in 3 days or less. Report any unusual symptoms to your doctor or health care professional.  What side effects may I notice from receiving this medicine?  Side effects that you should report to your doctor or health care professional as soon as possible:  -allergic reactions like skin rash, itching or hives, swelling of the face, lips, or tongue  -breathing problems  -confused  -fast or irregular heartbeat  -fever over 102 degrees F  -seizures  -unusual bleeding or bruising  -unusual muscle weakness  Side effects that usually do not require medical attention (report to your doctor or health care professional if they continue or are bothersome):  -aches and pains  -diarrhea  -fever of 102 degrees F or less  -headache  -irritable  -loss of appetite  -pain, tender at site where injected  -trouble sleeping  This list may not describe all possible side effects. Call your doctor for medical advice about side effects. You may report side effects to FDA at 1-800-FDA-1088.  Where should I keep   my medicine?  This does not apply. This vaccine is given in a clinic, pharmacy, doctor's office, or other health care setting and will not be stored at home.  NOTE: This sheet is a summary. It may not cover all possible information. If you have questions about this medicine, talk to your doctor, pharmacist, or health care provider.     © 2016, [employer]vier/[employer]. (2014-07-30  10:27:27)

## 2015-10-08 NOTE — Progress Notes (Signed)
Pre-visit discussion using our clinic review tool. No additional management support is needed unless otherwise documented below in the visit note.  

## 2015-10-10 ENCOUNTER — Encounter: Payer: Self-pay | Admitting: Internal Medicine

## 2015-10-10 NOTE — Assessment & Plan Note (Signed)
Sugars as outlined.  Improved.  a1c 6.9.  Low carb diet and exercise.  Follow met b and a1c.   Lab Results  Component Value Date   HGBA1C 6.9* 10/05/2015

## 2015-10-10 NOTE — Assessment & Plan Note (Signed)
On pravastatin.  Low cholesterol diet and exercise.  Follow  Lipid panel and liver function tests.   

## 2015-10-10 NOTE — Assessment & Plan Note (Signed)
Persistent right shoulder pain.  On meloxicam.  Seeing Dr Sabra Heck.  Desires no further intervention at this time.  Follow.

## 2015-10-10 NOTE — Assessment & Plan Note (Signed)
Physical today 10/08/15.  Mammogram 09/16/15 - Birads I.  Declines colonoscopy.  Agrees to cologuard.

## 2015-10-10 NOTE — Assessment & Plan Note (Signed)
On thyroid replacement.  Follow tsh.  

## 2015-10-10 NOTE — Assessment & Plan Note (Signed)
Had some stenosis on carotid ultrasound.  Seeing vascular surgery.  Just evaluated 08/2015.  States stable.  They are continuing to follow.  Continue daily aspirin.

## 2015-10-10 NOTE — Assessment & Plan Note (Signed)
Controlled.  

## 2015-10-10 NOTE — Assessment & Plan Note (Signed)
Controlled on protonix.   

## 2015-10-10 NOTE — Assessment & Plan Note (Signed)
On prozac and doing well.  Follow.   

## 2015-10-19 ENCOUNTER — Telehealth: Payer: Self-pay | Admitting: *Deleted

## 2015-10-19 NOTE — Telephone Encounter (Signed)
Patient request a medication Rx for voltaren.

## 2015-10-19 NOTE — Telephone Encounter (Signed)
Please advise refill? 

## 2015-10-20 NOTE — Telephone Encounter (Signed)
If she is on the meloxicam, she should not be using voltaren gel.  Would hold on refill since on meloxicam.

## 2015-10-22 ENCOUNTER — Other Ambulatory Visit: Payer: Self-pay | Admitting: Internal Medicine

## 2015-10-25 NOTE — Telephone Encounter (Signed)
Pt stated that she has stopped taking the meloxicam over a week ago.Marland Kitchen Pt is taking her sons Diclofenac 50mg  pt would like to be put on this.. Please advise pt

## 2015-10-26 ENCOUNTER — Other Ambulatory Visit: Payer: Self-pay | Admitting: Internal Medicine

## 2015-10-26 MED ORDER — DICLOFENAC POTASSIUM 50 MG PO TABS: 50.0000 mg | ORAL_TABLET | Freq: Two times a day (BID) | ORAL | Status: DC | PRN

## 2015-10-26 NOTE — Telephone Encounter (Signed)
If she is going to need an antiinflammatory regularly, then I would like for her to remain on meloxicam.  It may be less irritating to her stomach then other antiinflammatories.

## 2015-10-26 NOTE — Progress Notes (Signed)
rx sent in for diclofenac 50mg  #60 with no refills.

## 2015-10-26 NOTE — Telephone Encounter (Signed)
Called and LM on patient voicemail that RX for Diclofenac to pharmacy.

## 2015-10-29 ENCOUNTER — Telehealth: Payer: Self-pay | Admitting: *Deleted

## 2015-10-29 NOTE — Telephone Encounter (Signed)
Received a fax from [employer] [phone]) re: Cologuard. The 72 hour stability limit had been exceeded. The patient will be contacted to initiate a new sample collection.

## 2015-11-01 NOTE — Telephone Encounter (Signed)
Noted  

## 2015-11-02 ENCOUNTER — Other Ambulatory Visit: Payer: Self-pay | Admitting: Internal Medicine

## 2015-11-03 NOTE — Telephone Encounter (Signed)
Please advise refills, Mobic was filled on 10/08/15 for 90 day supply, should not need.  Refill on others?

## 2015-11-03 NOTE — Telephone Encounter (Signed)
I ok'd refill for trazodone #90 with one refill and protonix #90 with one refill.  I did not refill mobic.  She is on diclofenac now.  Should not be taking both.

## 2015-11-08 DIAGNOSIS — Z1211 Encounter for screening for malignant neoplasm of colon: Secondary | ICD-10-CM | POA: Diagnosis not present

## 2015-11-08 DIAGNOSIS — Z1212 Encounter for screening for malignant neoplasm of rectum: Secondary | ICD-10-CM | POA: Diagnosis not present

## 2015-11-09 ENCOUNTER — Telehealth: Payer: Self-pay | Admitting: Internal Medicine

## 2015-11-09 NOTE — Telephone Encounter (Addendum)
Pt came in a dropped off a patient assistance enrollment form to be filled out and mailed.. Pt stated that the formed needed to be signed in two places.. Please advise pt if any questions.. Placed in Dr. Lars Mage box

## 2015-11-09 NOTE — Telephone Encounter (Signed)
Form given to Dr. Nicki Reaper & we will mail it once completed

## 2015-11-17 LAB — COLOGUARD: COLOGUARD: NEGATIVE

## 2015-11-18 ENCOUNTER — Encounter: Payer: Self-pay | Admitting: Internal Medicine

## 2015-11-21 ENCOUNTER — Other Ambulatory Visit: Payer: Self-pay | Admitting: Internal Medicine

## 2015-11-23 ENCOUNTER — Other Ambulatory Visit: Payer: Self-pay | Admitting: Internal Medicine

## 2015-11-23 NOTE — Telephone Encounter (Signed)
Patient seen in December and medication last refilled in November 90-day supply and no refills. Please advise?

## 2015-11-24 NOTE — Telephone Encounter (Signed)
Need to confirm how often she is taking the xanax.  She just received #90 with no refills in November.  Question if needs now.

## 2015-11-24 NOTE — Telephone Encounter (Signed)
LMTCB

## 2015-11-25 ENCOUNTER — Encounter: Payer: Self-pay | Admitting: *Deleted

## 2015-11-30 ENCOUNTER — Ambulatory Visit (INDEPENDENT_AMBULATORY_CARE_PROVIDER_SITE_OTHER): Payer: Commercial Managed Care - HMO

## 2015-11-30 VITALS — BP 118/68 | HR 96 | Temp 98.1°F | Resp 12 | Ht 62.0 in | Wt 150.0 lb

## 2015-11-30 DIAGNOSIS — Z0000 Encounter for general adult medical examination without abnormal findings: Secondary | ICD-10-CM

## 2015-11-30 DIAGNOSIS — Z1159 Encounter for screening for other viral diseases: Secondary | ICD-10-CM | POA: Diagnosis not present

## 2015-11-30 NOTE — Progress Notes (Signed)
Subjective:   Jody Wang is a 70 y.o. female who presents for an Initial Medicare Annual Wellness Visit.  Review of Systems    No ROS.  Medicare Wellness Visit.  Cardiac Risk Factors include: hypertension;diabetes mellitus;advanced age (>55men, >66 women)     Objective:    Today's Vitals   11/30/15 1602  BP: 118/68  Pulse: 96  Temp: 98.1 F (36.7 C)  TempSrc: Oral  Resp: 12  Height: 5\' 2"  (1.575 m)  Weight: 150 lb (68.04 kg)  SpO2: 96%    Current Medications (verified) Outpatient Encounter Prescriptions as of 11/30/2015  Medication Sig  . ALPRAZolam (XANAX) 0.25 MG tablet TAKE 1 TABLET AT BEDTIME AS NEEDED FOR SLEEP  . diclofenac (CATAFLAM) 50 MG tablet Take 1 tablet (50 mg total) by mouth 2 (two) times daily as needed. Take with food.  Monitor blood pressure.  Marland Kitchen FLUoxetine (PROZAC) 20 MG capsule TAKE 1 CAPSULE EVERY DAY  . levothyroxine (SYNTHROID, LEVOTHROID) 88 MCG tablet Take 1 tablet (88 mcg total) by mouth daily.  . metFORMIN (GLUCOPHAGE) 500 MG tablet TAKE 2 TABLETS EVERY MORNING, 1 TABLET AT LUNCH, AND 2 TABLETS EVERY EVENING  . mometasone (NASONEX) 50 MCG/ACT nasal spray Place 2 sprays into the nose daily.  . pantoprazole (PROTONIX) 40 MG tablet TAKE 1 TABLET EVERY DAY  . pravastatin (PRAVACHOL) 40 MG tablet TAKE 1/2 TABLET EVERY DAY  . sitaGLIPtin (JANUVIA) 100 MG tablet Take 1 tablet (100 mg total) by mouth daily.  . traZODone (DESYREL) 50 MG tablet TAKE 1 TABLET EVERY DAY  . [DISCONTINUED] cetirizine (ZYRTEC) 10 MG tablet Take 1 tablet (10 mg total) by mouth daily.  . meloxicam (MOBIC) 15 MG tablet Take 1 tablet (15 mg total) by mouth daily. (Patient not taking: Reported on 11/30/2015)  . [DISCONTINUED] diclofenac sodium (VOLTAREN) 1 % GEL Apply 2 g topically 2 (two) times daily.   Facility-Administered Encounter Medications as of 11/30/2015  Medication  . glucose blood test strip STRP 1 each    Allergies (verified) Codeine; Meperidine and related;  Oxycontin; Tape; and Zetia   History: Past Medical History  Diagnosis Date  . Diabetes mellitus without complication ([place])   . Hypercholesterolemia   . Depression   . Hypothyroidism   . Osteoarthritis of knee    Past Surgical History  Procedure Laterality Date  . Abdominal hysterectomy  1985    ovaries not removed  . Inner ear surgery    . Breast cyst excision Right   . Breast cyst excision Left    Family History  Problem Relation Age of Onset  . Diabetes Father   . Coronary artery disease Father     s/p CABG  . Hypertension Father   . Hypercholesterolemia Father   . Hypertension Brother   . Hypertension Sister   . Glaucoma Sister   . Diabetes      niece  . Breast cancer Maternal Aunt   . Breast cancer Paternal Aunt   . Cervical cancer Paternal Aunt   . Breast cancer Cousin    Social History   Occupational History  . Not on file.   Social History Main Topics  . Smoking status: Former Smoker -- 1.00 packs/day for 23 years    Quit date: 11/06/1981  . Smokeless tobacco: Never Used  . Alcohol Use: 0.0 oz/week    0 Standard drinks or equivalent per week     Comment: rare  . Drug Use: No  . Sexual Activity: No  Tobacco Counseling Counseling given: Not Answered   Activities of Daily Living In your present state of health, do you have any difficulty performing the following activities: 11/30/2015  Hearing? N  Vision? N  Difficulty concentrating or making decisions? N  Walking or climbing stairs? Y  Dressing or bathing? N  Doing errands, shopping? N  Preparing Food and eating ? N  Using the Toilet? N  In the past six months, have you accidently leaked urine? N  Do you have problems with loss of bowel control? N  Managing your Medications? N  Managing your Finances? N  Housekeeping or managing your Housekeeping? N    Immunizations and Health Maintenance Immunization History  Administered Date(s) Administered  . Influenza Split 08/24/2013, 08/20/2014    . Influenza,inj,Quad PF,36+ Mos 07/09/2015  . Pneumococcal Conjugate-13 10/08/2015  . Pneumococcal Polysaccharide-23 07/01/2013  . Zoster 08/24/2013   Health Maintenance Due  Topic Date Due  . TETANUS/TDAP  10/08/1964  . DEXA SCAN  10/08/2010  . FOOT EXAM  06/16/2014  . URINE MICROALBUMIN  11/06/2015    Patient Care Team: Einar Pheasant, MD as PCP - General (Internal Medicine)  Indicate any recent Medical Services you may have received from other than [facility] providers in the past year (date may be approximate).     Assessment:   This is a routine wellness examination for Jody Wang. The goal of the wellness visit is to assist the patient how to close the gaps in care and create a preventative care plan for the patient.   Osteoporosis risk reviewed.   Taking meds without issues; no barriers identified.  Safety issues reviewed; smoke detectors in the home. No firearms in the home. Wears seatbelts when driving or riding with others. No violence in the home.  No identified risk were noted; The patient was oriented x 3; appropriate in dress and manner and no objective failures at ADL's or IADL's.   TDAP vaccine postponed for follow up with insurance, per patient request.  DEXA Scan postponed for follow up in April, per patient request.  Labs completed today: Hep C Screening.  Patient Concerns:  None at this time.  Follow up with PCP as needed.  Hearing/Vision screen Hearing Screening Comments: Passes the whisper test Vision Screening Comments: Followed by [facility] Annual visits Wears glasses   Dietary issues and exercise activities discussed: Current Exercise Habits:: The patient does not participate in regular exercise at present  Goals    . Healthy Lifestyle     Increase water intake up to 1 bottle=2 cups daily Choose lean meats, fruits and vegetables  Start water aerobics at least 1 time a week      Depression Screen PHQ 2/9 Scores 11/30/2015  10/08/2015 04/07/2014 01/16/2013 01/15/2013 10/08/2012  PHQ - 2 Score 0 0 2 0 0 0  PHQ- 9 Score - - 12 - - -    Fall Risk Fall Risk  11/30/2015 10/08/2015 04/07/2014 01/16/2013 01/15/2013  Falls in the past year? Yes Yes No No No  Number falls in past yr: 2 or more 1 - - -  Injury with Fall? No No - - -  Follow up Education provided;Falls prevention discussed - - - -    Cognitive Function: MMSE - Mini Mental State Exam 11/30/2015  Orientation to time 5  Orientation to Place 5  Registration 3  Attention/ Calculation 5  Recall 3  Language- name 2 objects 2  Language- repeat 1  Language- follow 3 step command 3  Language- read & follow direction 1  Write a sentence 1  Copy design 1  Total score 30    Screening Tests Health Maintenance  Topic Date Due  . TETANUS/TDAP  10/08/1964  . DEXA SCAN  10/08/2010  . FOOT EXAM  06/16/2014  . URINE MICROALBUMIN  11/06/2015  . OPHTHALMOLOGY EXAM  01/21/2016  . HEMOGLOBIN A1C  04/03/2016  . INFLUENZA VACCINE  06/06/2016  . MAMMOGRAM  09/15/2017  . Fecal DNA (Cologuard)  11/16/2018  . ZOSTAVAX  Completed  . Hepatitis C Screening  Completed  . PNA vac Low Risk Adult  Completed      Plan:   End of life planning was discussed; aging in home or other; Advanced directives. No HCPOA/Living Will.  Educational material provided.  Follow up with PCP as needed.  During the course of the visit, Jody Wang was educated and counseled about the following appropriate screening and preventive services:   Vaccines to include Pneumoccal, Influenza, Hepatitis B, Td, Zostavax, HCV  Electrocardiogram  Cardiovascular disease screening  Colorectal cancer screening  Bone density screening  Diabetes screening  Glaucoma screening  Mammography/PAP  Nutrition counseling  Smoking cessation counseling  Patient Instructions (the written plan) were given to the patient.    Varney Biles, LPN   [id number]    Reviewed above information.  Agree with  plan.    Dr Nicki Reaper

## 2015-11-30 NOTE — Patient Instructions (Addendum)
[name],  Thank you for taking time to come for your Medicare Wellness Visit.  I appreciate your ongoing commitment to your health goals. Please review the following plan we discussed and let me know if I can assist you in the future.  Follow up with Dr. Nicki Reaper as needed.  Fall Prevention in the Home  Falls can cause injuries. They can happen to people of all ages. There are many things you can do to make your home safe and to help prevent falls.  WHAT CAN I DO ON THE OUTSIDE OF MY HOME?  Regularly fix the edges of walkways and driveways and fix any cracks.  Remove anything that might make you trip as you walk through a door, such as a raised step or threshold.  Trim any bushes or trees on the path to your home.  Use bright outdoor lighting.  Clear any walking paths of anything that might make someone trip, such as rocks or tools.  Regularly check to see if handrails are loose or broken. Make sure that both sides of any steps have handrails.  Any raised decks and porches should have guardrails on the edges.  Have any leaves, snow, or ice cleared regularly.  Use sand or salt on walking paths during winter.  Clean up any spills in your garage right away. This includes oil or grease spills. WHAT CAN I DO IN THE BATHROOM?   Use night lights.  Install grab bars by the toilet and in the tub and shower. Do not use towel bars as grab bars.  Use non-skid mats or decals in the tub or shower.  If you need to sit down in the shower, use a plastic, non-slip stool.  Keep the floor dry. Clean up any water that spills on the floor as soon as it happens.  Remove soap buildup in the tub or shower regularly.  Attach bath mats securely with double-sided non-slip rug tape.  Do not have throw rugs and other things on the floor that can make you trip. WHAT CAN I DO IN THE BEDROOM?  Use night lights.  Make sure that you have a light by your bed that is easy to reach.  Do not use any  sheets or blankets that are too big for your bed. They should not hang down onto the floor.  Have a firm chair that has side arms. You can use this for support while you get dressed.  Do not have throw rugs and other things on the floor that can make you trip. WHAT CAN I DO IN THE KITCHEN?  Clean up any spills right away.  Avoid walking on wet floors.  Keep items that you use a lot in easy-to-reach places.  If you need to reach something above you, use a strong step stool that has a grab bar.  Keep electrical cords out of the way.  Do not use floor polish or wax that makes floors slippery. If you must use wax, use non-skid floor wax.  Do not have throw rugs and other things on the floor that can make you trip. WHAT CAN I DO WITH MY STAIRS?  Do not leave any items on the stairs.  Make sure that there are handrails on both sides of the stairs and use them. Fix handrails that are broken or loose. Make sure that handrails are as long as the stairways.  Check any carpeting to make sure that it is firmly attached to the stairs. Fix any carpet  that is loose or worn.  Avoid having throw rugs at the top or bottom of the stairs. If you do have throw rugs, attach them to the floor with carpet tape.  Make sure that you have a light switch at the top of the stairs and the bottom of the stairs. If you do not have them, ask someone to add them for you. WHAT ELSE CAN I DO TO HELP PREVENT FALLS?  Wear shoes that:  Do not have high heels.  Have rubber bottoms.  Are comfortable and fit you well.  Are closed at the toe. Do not wear sandals.  If you use a stepladder:  Make sure that it is fully opened. Do not climb a closed stepladder.  Make sure that both sides of the stepladder are locked into place.  Ask someone to hold it for you, if possible.  Clearly mark and make sure that you can see:  Any grab bars or handrails.  First and last steps.  Where the edge of each step  is.  Use tools that help you move around (mobility aids) if they are needed. These include:  Canes.  Walkers.  Scooters.  Crutches.  Turn on the lights when you go into a dark area. Replace any light bulbs as soon as they burn out.  Set up your furniture so you have a clear path. Avoid moving your furniture around.  If any of your floors are uneven, fix them.  If there are any pets around you, be aware of where they are.  Review your medicines with your doctor. Some medicines can make you feel dizzy. This can increase your chance of falling. Ask your doctor what other things that you can do to help prevent falls.   This information is not intended to replace advice given to you by your health care provider. Make sure you discuss any questions you have with your health care provider.   Document Released: 08/19/2009 Document Revised: 03/09/2015 Document Reviewed: 11/27/2014 Elsevier Interactive Patient Education 2016 [employer].  Hearing Loss Hearing loss is a partial or total loss of the ability to hear. This can be temporary or permanent, and it can happen in one or both ears. Hearing loss may be referred to as deafness. Medical care is necessary to treat hearing loss properly and to prevent the condition from getting worse. Your hearing may partially or completely come back, depending on what caused your hearing loss and how severe it is. In some cases, hearing loss is permanent. CAUSES Common causes of hearing loss include:   Too much wax in the ear canal.   Infection of the ear canal or middle ear.   Fluid in the middle ear.   Injury to the ear or surrounding area.   An object stuck in the ear.   Prolonged exposure to loud sounds, such as music.  Less common causes of hearing loss include:   Tumors in the ear.   Viral or bacterial infections, such as meningitis.   A hole in the eardrum (perforated eardrum).  Problems with the hearing nerve that sends  signals between the brain and the ear.  Certain medicines.  SYMPTOMS  Symptoms of this condition may include:  Difficulty telling the difference between sounds.  Difficulty following a conversation when there is background noise.  Lack of response to sounds in your environment. This may be most noticeable when you do not respond to startling sounds.  Needing to turn up the volume on the  television, radio, etc.  Ringing in the ears.  Dizziness.  Pain in the ears. DIAGNOSIS This condition is diagnosed based on a physical exam and a hearing test (audiometry). The audiometry test will be performed by a hearing specialist (audiologist). You may also be referred to an ear, nose, and throat (ENT) specialist (otolaryngologist).  TREATMENT Treatment for recent onset of hearing loss may include:   Ear wax removal.   Being prescribed medicines to prevent infection (antibiotics).   Being prescribed medicines to reduce inflammation (corticosteroids).  HOME CARE INSTRUCTIONS  If you were prescribed an antibiotic medicine, take it as told by your health care provider. Do not stop taking the antibiotic even if you start to feel better.  Take over-the-counter and prescription medicines only as told by your health care provider.  Avoid loud noises.   Return to your normal activities as told by your health care provider. Ask your health care provider what activities are safe for you.  Keep all follow-up visits as told by your health care provider. This is important. SEEK MEDICAL CARE IF:   You feel dizzy.   You develop new symptoms.   You vomit or feel nauseous.   You have a fever.  SEEK IMMEDIATE MEDICAL CARE IF:  You develop sudden changes in your vision.   You have severe ear pain.   You have new or increased weakness.  You have a severe headache.   This information is not intended to replace advice given to you by your health care provider. Make sure you discuss  any questions you have with your health care provider.   Document Released: 10/23/2005 Document Revised: 07/14/2015 Document Reviewed: 03/10/2015 Elsevier Interactive Patient Education [employer].

## 2015-12-01 NOTE — Telephone Encounter (Signed)
Pt did not return my call. Rx refused

## 2015-12-02 ENCOUNTER — Encounter: Payer: Self-pay | Admitting: *Deleted

## 2015-12-02 LAB — HEPATITIS C ANTIBODY: HCV Ab: NEGATIVE

## 2015-12-10 ENCOUNTER — Other Ambulatory Visit: Payer: Self-pay | Admitting: Internal Medicine

## 2015-12-10 NOTE — Telephone Encounter (Signed)
Ok to fill 

## 2015-12-12 NOTE — Telephone Encounter (Signed)
I have sent in refill for diclofenac #60 with no refills.  Per previous note, need to make sure she is not taking meloxicam with this medication.  If no, then take off medication list.  Also needs to come in for met b to be checked - to confirm kidney function doing ok since on this medication regularly.   Schedule for the next 1-2 weeks.

## 2015-12-14 ENCOUNTER — Other Ambulatory Visit: Payer: Self-pay | Admitting: *Deleted

## 2015-12-14 ENCOUNTER — Telehealth: Payer: Self-pay

## 2015-12-14 ENCOUNTER — Encounter: Payer: Self-pay | Admitting: *Deleted

## 2015-12-14 MED ORDER — DICLOFENAC POTASSIUM 50 MG PO TABS: ORAL_TABLET | ORAL | Status: DC

## 2015-12-14 NOTE — Telephone Encounter (Signed)
Letter & lab appt mailed 

## 2015-12-14 NOTE — Telephone Encounter (Signed)
Sent rx for cataflan  to [facility]

## 2015-12-28 ENCOUNTER — Other Ambulatory Visit (INDEPENDENT_AMBULATORY_CARE_PROVIDER_SITE_OTHER): Payer: Commercial Managed Care - HMO

## 2015-12-28 ENCOUNTER — Other Ambulatory Visit: Payer: Commercial Managed Care - HMO

## 2015-12-28 DIAGNOSIS — E119 Type 2 diabetes mellitus without complications: Secondary | ICD-10-CM

## 2015-12-28 LAB — BASIC METABOLIC PANEL
BUN: 16 mg/dL (ref 6–23)
CHLORIDE: 99 meq/L (ref 96–112)
CO2: 25 mEq/L (ref 19–32)
Calcium: 8.9 mg/dL (ref 8.4–10.5)
Creatinine, Ser: 0.82 mg/dL (ref 0.40–1.20)
GFR: 73.21 mL/min (ref >60.00)
Glucose, Bld: 163 mg/dL — ABNORMAL HIGH (ref 70–99)
POTASSIUM: 4.3 meq/L (ref 3.5–5.1)
Sodium: 134 mEq/L — ABNORMAL LOW (ref 135–145)

## 2016-01-03 ENCOUNTER — Telehealth: Payer: Self-pay | Admitting: *Deleted

## 2016-01-03 NOTE — Telephone Encounter (Signed)
Patient wanted to [facility] the Doctor that she has not ate salt in 30 years, and this could be the reason her sodium is low. She started eating salt on Thursday 12/30/15 to get her sodium levels up before her lab appt.

## 2016-01-04 NOTE — Telephone Encounter (Signed)
Notified patient via voicemail. 

## 2016-01-04 NOTE — Telephone Encounter (Signed)
Notify pt that she does not need to adjust her diet.  Sodium was just slightly decreased.  This could be just a variant.  Will follow and recheck as planned.

## 2016-01-10 ENCOUNTER — Other Ambulatory Visit: Payer: Self-pay | Admitting: Internal Medicine

## 2016-01-10 ENCOUNTER — Other Ambulatory Visit (INDEPENDENT_AMBULATORY_CARE_PROVIDER_SITE_OTHER): Payer: Commercial Managed Care - HMO

## 2016-01-10 DIAGNOSIS — E7800 Pure hypercholesterolemia, unspecified: Secondary | ICD-10-CM

## 2016-01-10 DIAGNOSIS — F329 Major depressive disorder, single episode, unspecified: Secondary | ICD-10-CM

## 2016-01-10 DIAGNOSIS — E039 Hypothyroidism, unspecified: Secondary | ICD-10-CM | POA: Diagnosis not present

## 2016-01-10 DIAGNOSIS — F32A Depression, unspecified: Secondary | ICD-10-CM

## 2016-01-10 DIAGNOSIS — E119 Type 2 diabetes mellitus without complications: Secondary | ICD-10-CM

## 2016-01-10 LAB — CBC WITH DIFFERENTIAL/PLATELET
BASOS PCT: 0.4 % (ref 0.0–3.0)
Basophils Absolute: 0 10*3/uL (ref 0.0–0.1)
EOS PCT: 5.4 % — AB (ref 0.0–5.0)
Eosinophils Absolute: 0.5 10*3/uL (ref 0.0–0.7)
HCT: 38 % (ref 36.0–46.0)
Hemoglobin: 12.4 g/dL (ref 12.0–15.0)
LYMPHS ABS: 1.8 10*3/uL (ref 0.7–4.0)
Lymphocytes Relative: 18.2 % (ref 12.0–46.0)
MCHC: 32.5 g/dL (ref 30.0–36.0)
MCV: 84.8 fl (ref 78.0–100.0)
MONO ABS: 0.6 10*3/uL (ref 0.1–1.0)
MONOS PCT: 6.6 % (ref 3.0–12.0)
NEUTROS ABS: 6.9 10*3/uL (ref 1.4–7.7)
Neutrophils Relative %: 69.4 % (ref 43.0–77.0)
Platelets: 434 10*3/uL — ABNORMAL HIGH (ref 150.0–400.0)
RBC: 4.48 Mil/uL (ref 3.87–5.11)
RDW: 14.1 % (ref 11.5–15.5)
WBC: 9.9 10*3/uL (ref 4.0–10.5)

## 2016-01-10 LAB — HEPATIC FUNCTION PANEL
ALK PHOS: 62 U/L (ref 39–117)
ALT: 16 U/L (ref 0–35)
AST: 15 U/L (ref 0–37)
Albumin: 4.2 g/dL (ref 3.5–5.2)
BILIRUBIN DIRECT: 0 mg/dL (ref 0.0–0.3)
BILIRUBIN TOTAL: 0.3 mg/dL (ref 0.2–1.2)
Total Protein: 7 g/dL (ref 6.0–8.3)

## 2016-01-10 LAB — HEMOGLOBIN A1C: Hgb A1c MFr Bld: 7.2 % — ABNORMAL HIGH (ref 4.6–6.5)

## 2016-01-10 LAB — LIPID PANEL
Cholesterol: 242 mg/dL — ABNORMAL HIGH (ref 0–200)
HDL: 42.4 mg/dL (ref >39.00)
LDL Cholesterol: 167 mg/dL — ABNORMAL HIGH (ref 0–99)
NONHDL: 199.32
Total CHOL/HDL Ratio: 6
Triglycerides: 161 mg/dL — ABNORMAL HIGH (ref 0.0–149.0)
VLDL: 32.2 mg/dL (ref 0.0–40.0)

## 2016-01-10 LAB — MICROALBUMIN / CREATININE URINE RATIO
Creatinine,U: 167.3 mg/dL
MICROALB/CREAT RATIO: 0.9 mg/g (ref 0.0–30.0)
Microalb, Ur: 1.5 mg/dL (ref 0.0–1.9)

## 2016-01-10 LAB — TSH: TSH: 2.36 u[IU]/mL (ref 0.35–4.50)

## 2016-01-10 NOTE — Telephone Encounter (Signed)
Patient was seen and medication refilled in December. Please advise?

## 2016-01-11 ENCOUNTER — Other Ambulatory Visit: Payer: Self-pay | Admitting: Internal Medicine

## 2016-01-11 ENCOUNTER — Other Ambulatory Visit (INDEPENDENT_AMBULATORY_CARE_PROVIDER_SITE_OTHER): Payer: Commercial Managed Care - HMO

## 2016-01-11 DIAGNOSIS — E871 Hypo-osmolality and hyponatremia: Secondary | ICD-10-CM

## 2016-01-11 DIAGNOSIS — E119 Type 2 diabetes mellitus without complications: Secondary | ICD-10-CM

## 2016-01-11 LAB — BASIC METABOLIC PANEL
BUN: 14 mg/dL (ref 6–23)
CHLORIDE: 104 meq/L (ref 96–112)
CO2: 20 mEq/L (ref 19–32)
CREATININE: 0.85 mg/dL (ref 0.40–1.20)
Calcium: 9 mg/dL (ref 8.4–10.5)
GFR: 70.22 mL/min (ref >60.00)
Glucose, Bld: 161 mg/dL — ABNORMAL HIGH (ref 70–99)
Potassium: 4.7 mEq/L (ref 3.5–5.1)
Sodium: 138 mEq/L (ref 135–145)

## 2016-01-11 NOTE — Progress Notes (Signed)
Order placed for add on lab - met b.  

## 2016-01-11 NOTE — Telephone Encounter (Signed)
Need to clarify.  Should have another refill.  Per review of refill history both were refilled in December with #90 with one refill.

## 2016-01-12 ENCOUNTER — Encounter: Payer: Self-pay | Admitting: *Deleted

## 2016-01-17 ENCOUNTER — Telehealth: Payer: Self-pay | Admitting: Internal Medicine

## 2016-01-17 NOTE — Telephone Encounter (Addendum)
Pt needs Rx Assistance Program form filled out and returned to her via mail.Marland Kitchen Please advise pt when complete..  Handed to [facility]

## 2016-01-17 NOTE — Telephone Encounter (Signed)
In quick sign folder. 

## 2016-01-18 NOTE — Telephone Encounter (Signed)
Form signed.  She will have to sign her part.

## 2016-01-18 NOTE — Telephone Encounter (Signed)
Left patient a message to notify her that form has been placed up front for pick up. Pt did not sign in two places on the back of form, therefore form is not completed.

## 2016-01-19 ENCOUNTER — Telehealth: Payer: Self-pay | Admitting: Internal Medicine

## 2016-01-19 NOTE — Telephone Encounter (Signed)
Pt called stating she needs a referral to the eye doctor to get her eyes checked [facility]) Dr Haze Rushing. Pt appt is on Monday 03/20 @3 :30pm. Call pt @ [phone]. Thank you!

## 2016-01-20 ENCOUNTER — Other Ambulatory Visit: Payer: Self-pay | Admitting: Internal Medicine

## 2016-01-24 DIAGNOSIS — H2513 Age-related nuclear cataract, bilateral: Secondary | ICD-10-CM | POA: Diagnosis not present

## 2016-01-25 DIAGNOSIS — H2513 Age-related nuclear cataract, bilateral: Secondary | ICD-10-CM | POA: Diagnosis not present

## 2016-01-25 LAB — HM DIABETES EYE EXAM

## 2016-01-26 ENCOUNTER — Encounter: Payer: Self-pay | Admitting: Internal Medicine

## 2016-01-31 ENCOUNTER — Other Ambulatory Visit: Payer: Self-pay | Admitting: Internal Medicine

## 2016-02-01 NOTE — Telephone Encounter (Signed)
Okay to refill? Last OV: 12/16 Next OV: 02/11/16

## 2016-02-02 NOTE — Telephone Encounter (Signed)
Rx faxed to [facility] 

## 2016-02-02 NOTE — Telephone Encounter (Signed)
ok'd refill xanax #[phone] with no refills.

## 2016-02-09 DIAGNOSIS — H2513 Age-related nuclear cataract, bilateral: Secondary | ICD-10-CM | POA: Diagnosis not present

## 2016-02-10 ENCOUNTER — Other Ambulatory Visit: Payer: Commercial Managed Care - HMO

## 2016-02-11 ENCOUNTER — Ambulatory Visit: Payer: Commercial Managed Care - HMO | Admitting: Internal Medicine

## 2016-02-14 ENCOUNTER — Encounter: Payer: Self-pay | Admitting: *Deleted

## 2016-02-14 NOTE — Discharge Instructions (Signed)

## 2016-02-16 ENCOUNTER — Ambulatory Visit: Payer: Commercial Managed Care - HMO | Admitting: Anesthesiology

## 2016-02-16 ENCOUNTER — Encounter
Admission: RE | Disposition: A | Discharge status: Home / Self Care | Payer: Self-pay | Source: Ambulatory Visit | Attending: Ophthalmology

## 2016-02-16 ENCOUNTER — Ambulatory Visit
Admission: RE | Admit: 2016-02-16 | Discharge: 2016-02-16 | Disposition: A | Discharge status: Home / Self Care | Payer: Commercial Managed Care - HMO | Source: Ambulatory Visit | Attending: Ophthalmology | Admitting: Ophthalmology

## 2016-02-16 DIAGNOSIS — Z885 Allergy status to narcotic agent status: Secondary | ICD-10-CM | POA: Diagnosis not present

## 2016-02-16 DIAGNOSIS — F329 Major depressive disorder, single episode, unspecified: Secondary | ICD-10-CM | POA: Insufficient documentation

## 2016-02-16 DIAGNOSIS — I739 Peripheral vascular disease, unspecified: Secondary | ICD-10-CM | POA: Insufficient documentation

## 2016-02-16 DIAGNOSIS — H2511 Age-related nuclear cataract, right eye: Secondary | ICD-10-CM | POA: Diagnosis not present

## 2016-02-16 DIAGNOSIS — Z91048 Other nonmedicinal substance allergy status: Secondary | ICD-10-CM | POA: Insufficient documentation

## 2016-02-16 DIAGNOSIS — H2513 Age-related nuclear cataract, bilateral: Secondary | ICD-10-CM | POA: Diagnosis not present

## 2016-02-16 DIAGNOSIS — E7800 Pure hypercholesterolemia, unspecified: Secondary | ICD-10-CM | POA: Diagnosis not present

## 2016-02-16 DIAGNOSIS — H9190 Unspecified hearing loss, unspecified ear: Secondary | ICD-10-CM | POA: Diagnosis not present

## 2016-02-16 DIAGNOSIS — E079 Disorder of thyroid, unspecified: Secondary | ICD-10-CM | POA: Diagnosis not present

## 2016-02-16 DIAGNOSIS — E119 Type 2 diabetes mellitus without complications: Secondary | ICD-10-CM | POA: Diagnosis not present

## 2016-02-16 DIAGNOSIS — Z87891 Personal history of nicotine dependence: Secondary | ICD-10-CM | POA: Insufficient documentation

## 2016-02-16 DIAGNOSIS — K219 Gastro-esophageal reflux disease without esophagitis: Secondary | ICD-10-CM | POA: Diagnosis not present

## 2016-02-16 DIAGNOSIS — R011 Cardiac murmur, unspecified: Secondary | ICD-10-CM | POA: Diagnosis not present

## 2016-02-16 HISTORY — DX: Presence of dental prosthetic device (complete) (partial): Z97.2

## 2016-02-16 HISTORY — DX: Cardiac murmur, unspecified: R01.1

## 2016-02-16 HISTORY — DX: Gastro-esophageal reflux disease without esophagitis: K21.9

## 2016-02-16 HISTORY — PX: CATARACT EXTRACTION W/PHACO: SHX586

## 2016-02-16 LAB — GLUCOSE, CAPILLARY
GLUCOSE-CAPILLARY: 150 mg/dL — AB (ref 65–99)
Glucose-Capillary: 134 mg/dL — ABNORMAL HIGH (ref 65–99)

## 2016-02-16 SURGERY — PHACOEMULSIFICATION, CATARACT, WITH IOL INSERTION
Anesthesia: Monitor Anesthesia Care | Site: Eye | Laterality: Right | Wound class: Clean

## 2016-02-16 MED ORDER — TETRACAINE HCL 0.5 % OP SOLN
1.0000 [drp] | OPHTHALMIC | Status: DC | PRN
  Administered 2016-02-16: 1 [drp] via OPHTHALMIC

## 2016-02-16 MED ORDER — NA HYALUR & NA CHOND-NA HYALUR 0.4-0.35 ML IO KIT
PACK | INTRAOCULAR | Status: DC | PRN
  Administered 2016-02-16: 1 mL via INTRAOCULAR

## 2016-02-16 MED ORDER — POVIDONE-IODINE 5 % OP SOLN
1.0000 "application " | OPHTHALMIC | Status: DC | PRN
  Administered 2016-02-16: 1 via OPHTHALMIC

## 2016-02-16 MED ORDER — EPINEPHRINE HCL 1 MG/ML IJ SOLN
INTRAMUSCULAR | Status: DC | PRN
  Administered 2016-02-16: 71 mL via OPHTHALMIC

## 2016-02-16 MED ORDER — ACETAMINOPHEN 160 MG/5ML PO SOLN: 325.0000 mg | ORAL | Status: DC | PRN

## 2016-02-16 MED ORDER — LACTATED RINGERS IV SOLN: INTRAVENOUS | Status: DC

## 2016-02-16 MED ORDER — ACETAMINOPHEN 325 MG PO TABS: 325.0000 mg | ORAL_TABLET | ORAL | Status: DC | PRN

## 2016-02-16 MED ORDER — BALANCED SALT IO SOLN
INTRAOCULAR | Status: DC | PRN
  Administered 2016-02-16: 1 mL via OPHTHALMIC

## 2016-02-16 MED ORDER — TIMOLOL MALEATE 0.5 % OP SOLN
OPHTHALMIC | Status: DC | PRN
Start: 2016-02-16 — End: 2016-02-16
  Administered 2016-02-16: 1 [drp] via OPHTHALMIC

## 2016-02-16 MED ORDER — CEFUROXIME OPHTHALMIC INJECTION 1 MG/0.1 ML
INJECTION | OPHTHALMIC | Status: DC | PRN
Start: 2016-02-16 — End: 2016-02-16
  Administered 2016-02-16: 0.1 mL via INTRACAMERAL

## 2016-02-16 MED ORDER — ARMC OPHTHALMIC DILATING GEL
1.0000 "application " | OPHTHALMIC | Status: DC | PRN
  Administered 2016-02-16 (×2): 1 via OPHTHALMIC

## 2016-02-16 MED ORDER — FENTANYL CITRATE (PF) 100 MCG/2ML IJ SOLN
INTRAMUSCULAR | Status: DC | PRN
  Administered 2016-02-16: 50 ug via INTRAVENOUS

## 2016-02-16 MED ORDER — MIDAZOLAM HCL 2 MG/2ML IJ SOLN
INTRAMUSCULAR | Status: DC | PRN
  Administered 2016-02-16: 2 mg via INTRAVENOUS

## 2016-02-16 MED ORDER — BRIMONIDINE TARTRATE 0.2 % OP SOLN
OPHTHALMIC | Status: DC | PRN
  Administered 2016-02-16: 1 [drp] via OPHTHALMIC

## 2016-02-16 SURGICAL SUPPLY — 26 items
CANNULA ANT/CHMB 27GA (MISCELLANEOUS) ×2 IMPLANT
CARTRIDGE ABBOTT (MISCELLANEOUS) ×2 IMPLANT
GLOVE SURG LX 7.5 STRW (GLOVE) ×1
GLOVE SURG LX STRL 7.5 STRW (GLOVE) ×1 IMPLANT
GLOVE SURG TRIUMPH 8.0 PF LTX (GLOVE) ×2 IMPLANT
GOWN STRL REUS W/ TWL LRG LVL3 (GOWN DISPOSABLE) ×2 IMPLANT
GOWN STRL REUS W/TWL LRG LVL3 (GOWN DISPOSABLE) ×2
LENS IOL TECNIS ITEC 19.0 (Intraocular Lens) ×2 IMPLANT
MARKER SKIN DUAL TIP RULER LAB (MISCELLANEOUS) ×2 IMPLANT
NDL RETROBULBAR .5 NSTRL (NEEDLE) IMPLANT
NEEDLE FILTER BLUNT 18X 1/2SAF (NEEDLE) ×1
NEEDLE FILTER BLUNT 18X1 1/2 (NEEDLE) ×1 IMPLANT
PACK CATARACT BRASINGTON (MISCELLANEOUS) ×2 IMPLANT
PACK EYE AFTER SURG (MISCELLANEOUS) ×2 IMPLANT
PACK OPTHALMIC (MISCELLANEOUS) ×2 IMPLANT
RING MALYGIN 7.0 (MISCELLANEOUS) IMPLANT
SUT ETHILON 10-0 CS-B-6CS-B-6 (SUTURE)
SUT VICRYL  9 0 (SUTURE)
SUT VICRYL 9 0 (SUTURE) IMPLANT
SUTURE EHLN 10-0 CS-B-6CS-B-6 (SUTURE) IMPLANT
SYR 3ML LL SCALE MARK (SYRINGE) ×2 IMPLANT
SYR 5ML LL (SYRINGE) IMPLANT
SYR TB 1ML LUER SLIP (SYRINGE) ×2 IMPLANT
WATER STERILE IRR 250ML POUR (IV SOLUTION) ×2 IMPLANT
WATER STERILE IRR 500ML POUR (IV SOLUTION) IMPLANT
WIPE NON LINTING 3.25X3.25 (MISCELLANEOUS) ×2 IMPLANT

## 2016-02-16 NOTE — Op Note (Signed)
LOCATION:  [place]   PREOPERATIVE DIAGNOSIS:    Nuclear sclerotic cataract right eye. H25.11   POSTOPERATIVE DIAGNOSIS:  Nuclear sclerotic cataract right eye.     PROCEDURE:  Phacoemusification with posterior chamber intraocular lens placement of the right eye   LENS:   Implant Name Type Inv. Item Serial No. Manufacturer Lot No. LRB No. Used  technis aspheric iol pre-loaded     [id number] [facility]   Right 1     PCB00 19.0 D   ULTRASOUND TIME: 18 % of 1 minutes, 21 seconds.  CDE 14.4   SURGEON:  Wyonia Hough, MD   ANESTHESIA:  Topical with tetracaine drops and 2% Xylocaine jelly, augmented with 1% preservative-free intracameral lidocaine.    COMPLICATIONS:  None.   DESCRIPTION OF PROCEDURE:  The patient was identified in the holding room and transported to the operating room and placed in the supine position under the operating microscope.  The right eye was identified as the operative eye and it was prepped and draped in the usual sterile ophthalmic fashion.   A 1 millimeter clear-corneal paracentesis was made at the 12:00 position.  0.5 ml of preservative-free 1% lidocaine was injected into the anterior chamber. The anterior chamber was filled with Viscoat viscoelastic.  A 2.4 millimeter keratome was used to make a near-clear corneal incision at the 9:00 position.  A curvilinear capsulorrhexis was made with a cystotome and capsulorrhexis forceps.  Balanced salt solution was used to hydrodissect and hydrodelineate the nucleus.   Phacoemulsification was then used in stop and chop fashion to remove the lens nucleus and epinucleus.  The remaining cortex was then removed using the irrigation and aspiration handpiece. Provisc was then placed into the capsular bag to distend it for lens placement.  A lens was then injected into the capsular bag.  The remaining viscoelastic was aspirated.   Wounds were hydrated with balanced salt solution.  The anterior chamber was  inflated to a physiologic pressure with balanced salt solution.  No wound leaks were noted. Cefuroxime 0.1 ml of a 10mg /ml solution was injected into the anterior chamber for a dose of 1 mg of intracameral antibiotic at the completion of the case.   Timolol and Brimonidine drops were applied to the eye.  The patient was taken to the recovery room in stable condition without complications of anesthesia or surgery.   Litha Lamartina 02/16/2016, 8:11 AM

## 2016-02-16 NOTE — Anesthesia Procedure Notes (Signed)
Procedure Name: MAC Performed by: Frieda Arnall Pre-anesthesia Checklist: Patient identified, Emergency Drugs available, Suction available, Timeout performed and Patient being monitored Patient Re-evaluated:Patient Re-evaluated prior to inductionOxygen Delivery Method: Nasal cannula Placement Confirmation: positive ETCO2     

## 2016-02-16 NOTE — H&P (Signed)
  The History and Physical notes are on paper, have been signed, and are to be scanned. The patient remains stable and unchanged from the H&P.   Previous H&P reviewed, patient examined, and there are no changes.  Jody Wang 02/16/2016 7:42 AM

## 2016-02-16 NOTE — Anesthesia Preprocedure Evaluation (Signed)
Anesthesia Evaluation  Patient identified by MRN, date of birth, ID band  Reviewed: Allergy & Precautions, H&P , NPO status , Patient's Chart, lab work & pertinent test results  Airway Mallampati: III  TM Distance: >3 FB Neck ROM: full    Dental no notable dental hx.    Pulmonary former smoker,    Pulmonary exam normal        Cardiovascular + Peripheral Vascular Disease   Rhythm:regular Rate:Normal     Neuro/Psych    GI/Hepatic GERD  ,  Endo/Other  diabetesHypothyroidism   Renal/GU      Musculoskeletal   Abdominal   Peds  Hematology   Anesthesia Other Findings   Reproductive/Obstetrics                             Anesthesia Physical Anesthesia Plan  ASA: II  Anesthesia Plan: MAC   Post-op Pain Management:    Induction:   Airway Management Planned:   Additional Equipment:   Intra-op Plan:   Post-operative Plan:   Informed Consent: I have reviewed the patients History and Physical, chart, labs and discussed the procedure including the risks, benefits and alternatives for the proposed anesthesia with the patient or authorized representative who has indicated his/her understanding and acceptance.     Plan Discussed with: CRNA  Anesthesia Plan Comments:         Anesthesia Quick Evaluation

## 2016-02-16 NOTE — Anesthesia Postprocedure Evaluation (Signed)
Anesthesia Post Note  Patient: Jody Wang  Procedure(s) Performed: Procedure(s) (LRB): CATARACT EXTRACTION PHACO AND INTRAOCULAR LENS PLACEMENT (IOC) RIGHT  (Right)  Patient location during evaluation: PACU Anesthesia Type: MAC Level of consciousness: awake and alert and oriented Pain management: satisfactory to patient Vital Signs Assessment: post-procedure vital signs reviewed and stable Respiratory status: spontaneous breathing, nonlabored ventilation and respiratory function stable Cardiovascular status: blood pressure returned to baseline and stable Postop Assessment: Adequate PO intake and No signs of nausea or vomiting Anesthetic complications: no    Raliegh Ip

## 2016-02-16 NOTE — Transfer of Care (Signed)
Immediate Anesthesia Transfer of Care Note  Patient: Jody Wang  Procedure(s) Performed: Procedure(s) with comments: CATARACT EXTRACTION PHACO AND INTRAOCULAR LENS PLACEMENT (IOC) RIGHT  (Right) - DIABETIC - oral meds  Patient Location: PACU  Anesthesia Type: MAC  Level of Consciousness: awake, alert  and patient cooperative  Airway and Oxygen Therapy: Patient Spontanous Breathing and Patient connected to supplemental oxygen  Post-op Assessment: Post-op Vital signs reviewed, Patient's Cardiovascular Status Stable, Respiratory Function Stable, Patent Airway and No signs of Nausea or vomiting  Post-op Vital Signs: Reviewed and stable  Complications: No apparent anesthesia complications

## 2016-02-17 ENCOUNTER — Encounter: Payer: Self-pay | Admitting: Ophthalmology

## 2016-03-03 ENCOUNTER — Ambulatory Visit (INDEPENDENT_AMBULATORY_CARE_PROVIDER_SITE_OTHER): Payer: Commercial Managed Care - HMO | Admitting: Internal Medicine

## 2016-03-03 ENCOUNTER — Encounter: Payer: Self-pay | Admitting: Internal Medicine

## 2016-03-03 VITALS — BP 150/70 | HR 76 | Temp 97.6°F | Ht 62.0 in | Wt 150.2 lb

## 2016-03-03 DIAGNOSIS — E039 Hypothyroidism, unspecified: Secondary | ICD-10-CM | POA: Diagnosis not present

## 2016-03-03 DIAGNOSIS — K219 Gastro-esophageal reflux disease without esophagitis: Secondary | ICD-10-CM | POA: Diagnosis not present

## 2016-03-03 DIAGNOSIS — E119 Type 2 diabetes mellitus without complications: Secondary | ICD-10-CM | POA: Diagnosis not present

## 2016-03-03 DIAGNOSIS — F32A Depression, unspecified: Secondary | ICD-10-CM

## 2016-03-03 DIAGNOSIS — H6123 Impacted cerumen, bilateral: Secondary | ICD-10-CM | POA: Diagnosis not present

## 2016-03-03 DIAGNOSIS — F329 Major depressive disorder, single episode, unspecified: Secondary | ICD-10-CM

## 2016-03-03 DIAGNOSIS — Z91048 Other nonmedicinal substance allergy status: Secondary | ICD-10-CM

## 2016-03-03 DIAGNOSIS — Z9109 Other allergy status, other than to drugs and biological substances: Secondary | ICD-10-CM

## 2016-03-03 DIAGNOSIS — E7800 Pure hypercholesterolemia, unspecified: Secondary | ICD-10-CM

## 2016-03-03 DIAGNOSIS — M25569 Pain in unspecified knee: Secondary | ICD-10-CM

## 2016-03-03 DIAGNOSIS — Z79899 Other long term (current) drug therapy: Secondary | ICD-10-CM | POA: Diagnosis not present

## 2016-03-03 MED ORDER — LEVOCETIRIZINE DIHYDROCHLORIDE 5 MG PO TABS: 5.0000 mg | ORAL_TABLET | Freq: Every day | ORAL | Status: DC

## 2016-03-03 MED ORDER — FLUTICASONE PROPIONATE 50 MCG/ACT NA SUSP: 2.0000 | Freq: Every day | NASAL | Status: DC

## 2016-03-03 MED ORDER — CARBAMIDE PEROXIDE 6.5 % OT SOLN: 5.0000 [drp] | Freq: Two times a day (BID) | OTIC | Status: DC

## 2016-03-03 MED ORDER — ETODOLAC 400 MG PO TABS: ORAL_TABLET | ORAL | Status: DC

## 2016-03-03 NOTE — Progress Notes (Signed)
Patient ID: Jody Wang, female   DOB: 27-Apr-1946, 70 y.o.   MRN: [id number]   Subjective:    Patient ID: Jody Wang, female    DOB: 30-Oct-1946, 70 y.o.   MRN: [id number]  HPI  Patient here for a scheduled follow up.  She brought in sugar readings.  Reviewed.  a1c last checked 01/2016 - 7.2.  Discussed medication changes.  She is needing to come off [place], secondary to cost.  Has had low surgars with amaryl (?) previously.  She gets up late.  Does not eat breakfast.  Discussed diet and exercise.  Discussed diet changes.  Discussed appropriate bedtime snacks.  She also needs something other than diclofenac for her msk pain.  Is too expensive.  meloxicam does not work.  Blood pressure slightly elevated today.  Discussed need to monitor on antiinflammatory.  She also reports increased ear wax.  Wants to use something to soften the wax.  Tries to stay active.  Does not exercise regularly secondary to limitations from her knees.  Discussed water aerobics.  Also needs to change her nasonex rx.  Needs something cheaper.     Past Medical History  Diagnosis Date  . Diabetes mellitus without complication ([place])   . Hypercholesterolemia   . Depression   . Hypothyroidism   . Osteoarthritis of knee   . GERD (gastroesophageal reflux disease)   . Wears dentures     partial lower  . Heart murmur     slight - followed by PCP   Past Surgical History  Procedure Laterality Date  . Abdominal hysterectomy  1985    ovaries not removed  . Inner ear surgery    . Breast cyst excision Right   . Breast cyst excision Left   . Cataract extraction w/phaco Right 02/16/2016    Procedure: CATARACT EXTRACTION PHACO AND INTRAOCULAR LENS PLACEMENT (IOC) RIGHT ;  Surgeon: Leandrew Koyanagi, MD;  Location: [facility];  Service: Ophthalmology;  Laterality: Right;  DIABETIC - oral meds   Family History  Problem Relation Age of Onset  . Diabetes Father   . Coronary artery disease Father     s/p  CABG  . Hypertension Father   . Hypercholesterolemia Father   . Hypertension Brother   . Hypertension Sister   . Glaucoma Sister   . Diabetes      niece  . Breast cancer Maternal Aunt   . Breast cancer Paternal Aunt   . Cervical cancer Paternal Aunt   . Breast cancer Cousin    Social History   Social History  . Marital Status: Divorced    Spouse Name: N/A  . Number of Children: 3  . Years of Education: N/A   Social History Main Topics  . Smoking status: Former Smoker -- 1.00 packs/day for 23 years    Quit date: 11/06/1981  . Smokeless tobacco: Never Used  . Alcohol Use: 0.0 oz/week    0 Standard drinks or equivalent per week     Comment: rare  . Drug Use: No  . Sexual Activity: No   Other Topics Concern  . None   Social History Narrative    Outpatient Encounter Prescriptions as of 03/03/2016  Medication Sig  . ALPRAZolam (XANAX) 0.25 MG tablet TAKE 1 TABLET AT BEDTIME AS NEEDED FOR SLEEP  . FLUoxetine (PROZAC) 20 MG capsule TAKE 1 CAPSULE EVERY DAY  . levothyroxine (SYNTHROID, LEVOTHROID) 88 MCG tablet Take 1 tablet (88 mcg total) by mouth daily.  . metFORMIN (GLUCOPHAGE)  500 MG tablet TAKE 2 TABLETS IN THE MORNING, TAKE 1 TABLET AT LUNCH,  AND TAKE 2 TABLETS EVERY EVENING  . pantoprazole (PROTONIX) 40 MG tablet TAKE 1 TABLET EVERY DAY  . pravastatin (PRAVACHOL) 40 MG tablet TAKE 1/2 TABLET EVERY DAY  . sitaGLIPtin (JANUVIA) 100 MG tablet Take 1 tablet (100 mg total) by mouth daily.  . traZODone (DESYREL) 50 MG tablet TAKE 1 TABLET EVERY DAY  . [DISCONTINUED] diclofenac (CATAFLAM) 50 MG tablet take 1 tablet by mouth twice a day if needed with food  (MONITOR BLOOD PRESSURE)  . [DISCONTINUED] mometasone (NASONEX) 50 MCG/ACT nasal spray Place 2 sprays into the nose daily.  . carbamide peroxide (DEBROX) 6.5 % otic solution Place 5 drops into both ears 2 (two) times daily.  Marland Kitchen etodolac (LODINE) 400 MG tablet Tablet q day  . fluticasone (FLONASE) 50 MCG/ACT nasal spray  Place 2 sprays into both nostrils daily.  Marland Kitchen levocetirizine (XYZAL) 5 MG tablet Take 1 tablet (5 mg total) by mouth daily.   Facility-Administered Encounter Medications as of 03/03/2016  Medication  . glucose blood test strip STRP 1 each    Review of Systems  Constitutional: Negative for appetite change and unexpected weight change.  HENT: Negative for congestion and sinus pressure.   Respiratory: Negative for cough, chest tightness and shortness of breath.   Cardiovascular: Negative for chest pain, palpitations and leg swelling.  Gastrointestinal: Negative for nausea, vomiting, abdominal pain and diarrhea.  Genitourinary: Negative for dysuria and difficulty urinating.  Musculoskeletal: Negative for myalgias.       Knee pain as outlined.   Skin: Negative for color change and rash.  Neurological: Negative for dizziness, light-headedness and headaches.  Psychiatric/Behavioral: Negative for dysphoric mood and agitation.       Objective:    Physical Exam  Constitutional: She appears well-developed and well-nourished. No distress.  HENT:  Nose: Nose normal.  Mouth/Throat: Oropharynx is clear and moist.  Cerumen in both ears.    Neck: Neck supple. No thyromegaly present.  Cardiovascular: Normal rate and regular rhythm.   Pulmonary/Chest: Breath sounds normal. No respiratory distress. She has no wheezes.  Abdominal: Soft. Bowel sounds are normal. There is no tenderness.  Musculoskeletal: She exhibits no edema or tenderness.  Lymphadenopathy:    She has no cervical adenopathy.  Skin: No rash noted. No erythema.  Psychiatric: She has a normal mood and affect. Her behavior is normal.    BP 150/70 mmHg  Pulse 76  Temp(Src) 97.6 F (36.4 C) (Oral)  Ht _0  (1.575 m)  Wt 150 lb 3.2 oz (68.13 kg)  BMI 27.46 kg/m2  SpO2 97% Wt Readings from Last 3 Encounters:  03/03/16 150 lb 3.2 oz (68.13 kg)  02/16/16 146 lb (66.225 kg)  11/30/15 150 lb (68.04 kg)     Lab Results    Component Value Date   WBC 9.9 01/10/2016   HGB 12.4 01/10/2016   HCT 38.0 01/10/2016   PLT 434.0* 01/10/2016   GLUCOSE 161* 01/11/2016   CHOL 242* 01/10/2016   TRIG 161.0* 01/10/2016   HDL 42.40 01/10/2016   LDLDIRECT 188.8 06/30/2013   LDLCALC 167* 01/10/2016   ALT 16 01/10/2016   AST 15 01/10/2016   NA 138 01/11/2016   K 4.7 01/11/2016   CL 104 01/11/2016   CREATININE 0.85 01/11/2016   BUN 14 01/11/2016   CO2 20 01/11/2016   TSH 2.36 01/10/2016   HGBA1C 7.2* 01/10/2016   MICROALBUR 1.5 01/10/2016  Assessment & Plan:   Problem List Items Addressed This Visit    Cerumen impaction    Debrox as directed.        Depression    On prozac.  Stable.       Diabetes mellitus ([place])    Sugars reviewed.  Discussed diet and exercise.  Discussed diet adjustment.  Needs to eat ealier and get up earlier.  Needs to eat breakfast.  Discussed appropriate bedtime snacks.  She is going to have to stop [place] - secondary to cost.  She is going to call the company and see if can get cheaper.  If not, wants to try diet adjustment and exercise before adding another medication.  Follow sugars.  Follow met b and a1c.       Environmental allergies    Change nasonex to flonase.  Follow.        GERD (gastroesophageal reflux disease) - Primary    Controlled on current regimen.       Hypercholesterolemia    On pravastatin.  Low cholesterol diet and exercise.  Follow lipid panel and liver function tests.        Hypothyroidism    On thyroid replacement.  Follow tsh.        Knee pain    Persistent.  Off meloxicam.  Diclofenac is too expensive.  Will try a trail of lodine.  Follow pressures.  Follow kiddney function.  Take with food.            Einar Pheasant, MD

## 2016-03-03 NOTE — Progress Notes (Signed)
Pre visit review using our clinic review tool, if applicable. No additional management support is needed unless otherwise documented below in the visit note. 

## 2016-03-05 ENCOUNTER — Encounter: Payer: Self-pay | Admitting: Internal Medicine

## 2016-03-05 NOTE — Assessment & Plan Note (Signed)
Persistent.  Off meloxicam.  Diclofenac is too expensive.  Will try a trail of lodine.  Follow pressures.  Follow kiddney function.  Take with food.

## 2016-03-05 NOTE — Assessment & Plan Note (Signed)
On thyroid replacement.  Follow tsh.  

## 2016-03-05 NOTE — Assessment & Plan Note (Signed)
On pravastatin.  Low cholesterol diet and exercise.  Follow lipid panel and liver function tests.   

## 2016-03-05 NOTE — Assessment & Plan Note (Signed)
Debrox as directed

## 2016-03-05 NOTE — Assessment & Plan Note (Signed)
On prozac.  Stable.   

## 2016-03-05 NOTE — Assessment & Plan Note (Signed)
Sugars reviewed.  Discussed diet and exercise.  Discussed diet adjustment.  Needs to eat ealier and get up earlier.  Needs to eat breakfast.  Discussed appropriate bedtime snacks.  She is going to have to stop [place] - secondary to cost.  She is going to call the company and see if can get cheaper.  If not, wants to try diet adjustment and exercise before adding another medication.  Follow sugars.  Follow met b and a1c.

## 2016-03-05 NOTE — Assessment & Plan Note (Signed)
Controlled on current regimen.   

## 2016-03-05 NOTE — Assessment & Plan Note (Signed)
Change nasonex to flonase.  Follow.

## 2016-03-07 ENCOUNTER — Other Ambulatory Visit: Payer: Self-pay | Admitting: Internal Medicine

## 2016-03-07 DIAGNOSIS — H2512 Age-related nuclear cataract, left eye: Secondary | ICD-10-CM | POA: Diagnosis not present

## 2016-03-08 ENCOUNTER — Encounter: Payer: Self-pay | Admitting: *Deleted

## 2016-03-09 NOTE — Discharge Instructions (Signed)

## 2016-03-15 ENCOUNTER — Ambulatory Visit: Payer: Commercial Managed Care - HMO | Admitting: Anesthesiology

## 2016-03-15 ENCOUNTER — Ambulatory Visit
Admission: RE | Admit: 2016-03-15 | Discharge: 2016-03-15 | Disposition: A | Discharge status: Home / Self Care | Payer: Commercial Managed Care - HMO | Source: Ambulatory Visit | Attending: Ophthalmology | Admitting: Ophthalmology

## 2016-03-15 ENCOUNTER — Encounter
Admission: RE | Disposition: A | Discharge status: Home / Self Care | Payer: Self-pay | Source: Ambulatory Visit | Attending: Ophthalmology

## 2016-03-15 DIAGNOSIS — E7800 Pure hypercholesterolemia, unspecified: Secondary | ICD-10-CM | POA: Diagnosis not present

## 2016-03-15 DIAGNOSIS — M1990 Unspecified osteoarthritis, unspecified site: Secondary | ICD-10-CM | POA: Insufficient documentation

## 2016-03-15 DIAGNOSIS — H9190 Unspecified hearing loss, unspecified ear: Secondary | ICD-10-CM | POA: Diagnosis not present

## 2016-03-15 DIAGNOSIS — K219 Gastro-esophageal reflux disease without esophagitis: Secondary | ICD-10-CM | POA: Diagnosis not present

## 2016-03-15 DIAGNOSIS — E119 Type 2 diabetes mellitus without complications: Secondary | ICD-10-CM | POA: Diagnosis not present

## 2016-03-15 DIAGNOSIS — H2512 Age-related nuclear cataract, left eye: Secondary | ICD-10-CM | POA: Insufficient documentation

## 2016-03-15 DIAGNOSIS — Z91048 Other nonmedicinal substance allergy status: Secondary | ICD-10-CM | POA: Diagnosis not present

## 2016-03-15 DIAGNOSIS — E079 Disorder of thyroid, unspecified: Secondary | ICD-10-CM | POA: Diagnosis not present

## 2016-03-15 DIAGNOSIS — F329 Major depressive disorder, single episode, unspecified: Secondary | ICD-10-CM | POA: Insufficient documentation

## 2016-03-15 DIAGNOSIS — Z885 Allergy status to narcotic agent status: Secondary | ICD-10-CM | POA: Insufficient documentation

## 2016-03-15 DIAGNOSIS — Z9841 Cataract extraction status, right eye: Secondary | ICD-10-CM | POA: Insufficient documentation

## 2016-03-15 DIAGNOSIS — I739 Peripheral vascular disease, unspecified: Secondary | ICD-10-CM | POA: Insufficient documentation

## 2016-03-15 DIAGNOSIS — Z87891 Personal history of nicotine dependence: Secondary | ICD-10-CM | POA: Insufficient documentation

## 2016-03-15 HISTORY — PX: CATARACT EXTRACTION W/PHACO: SHX586

## 2016-03-15 LAB — GLUCOSE, CAPILLARY
GLUCOSE-CAPILLARY: 130 mg/dL — AB (ref 65–99)
Glucose-Capillary: 135 mg/dL — ABNORMAL HIGH (ref 65–99)

## 2016-03-15 SURGERY — PHACOEMULSIFICATION, CATARACT, WITH IOL INSERTION
Anesthesia: Monitor Anesthesia Care | Laterality: Left | Wound class: Clean

## 2016-03-15 MED ORDER — BRIMONIDINE TARTRATE 0.2 % OP SOLN
OPHTHALMIC | Status: DC | PRN
  Administered 2016-03-15: 1 [drp] via OPHTHALMIC

## 2016-03-15 MED ORDER — MIDAZOLAM HCL 2 MG/2ML IJ SOLN
INTRAMUSCULAR | Status: DC | PRN
  Administered 2016-03-15: 2 mg via INTRAVENOUS

## 2016-03-15 MED ORDER — TETRACAINE HCL 0.5 % OP SOLN
1.0000 [drp] | OPHTHALMIC | Status: DC | PRN
  Administered 2016-03-15: 1 [drp] via OPHTHALMIC

## 2016-03-15 MED ORDER — ARMC OPHTHALMIC DILATING GEL
1.0000 "application " | OPHTHALMIC | Status: DC | PRN
  Administered 2016-03-15: 1 via OPHTHALMIC

## 2016-03-15 MED ORDER — POVIDONE-IODINE 5 % OP SOLN
1.0000 | OPHTHALMIC | Status: DC | PRN
Start: 2016-03-15 — End: 2016-03-15
  Administered 2016-03-15: 1 via OPHTHALMIC

## 2016-03-15 MED ORDER — NA HYALUR & NA CHOND-NA HYALUR 0.4-0.35 ML IO KIT
PACK | INTRAOCULAR | Status: DC | PRN
  Administered 2016-03-15: 1 mL via INTRAOCULAR

## 2016-03-15 MED ORDER — CEFUROXIME OPHTHALMIC INJECTION 1 MG/0.1 ML
INJECTION | OPHTHALMIC | Status: DC | PRN
  Administered 2016-03-15: 0.1 mL via INTRACAMERAL

## 2016-03-15 MED ORDER — LIDOCAINE HCL (PF) 4 % IJ SOLN
INTRAOCULAR | Status: DC | PRN
  Administered 2016-03-15: 1 mL via OPHTHALMIC

## 2016-03-15 MED ORDER — EPINEPHRINE HCL 1 MG/ML IJ SOLN
INTRAOCULAR | Status: DC | PRN
  Administered 2016-03-15: 65 mL via OPHTHALMIC

## 2016-03-15 MED ORDER — TIMOLOL MALEATE 0.5 % OP SOLN
OPHTHALMIC | Status: DC | PRN
  Administered 2016-03-15: 1 [drp] via OPHTHALMIC

## 2016-03-15 MED ORDER — FENTANYL CITRATE (PF) 100 MCG/2ML IJ SOLN
INTRAMUSCULAR | Status: DC | PRN
  Administered 2016-03-15: 50 ug via INTRAVENOUS

## 2016-03-15 SURGICAL SUPPLY — 21 items
CANNULA ANT/CHMB 27GA (MISCELLANEOUS) ×2 IMPLANT
CARTRIDGE ABBOTT (MISCELLANEOUS) IMPLANT
GLOVE SURG LX 7.5 STRW (GLOVE) ×1
GLOVE SURG LX STRL 7.5 STRW (GLOVE) ×1 IMPLANT
GLOVE SURG TRIUMPH 8.0 PF LTX (GLOVE) ×2 IMPLANT
GOWN STRL REUS W/ TWL LRG LVL3 (GOWN DISPOSABLE) ×2 IMPLANT
GOWN STRL REUS W/TWL LRG LVL3 (GOWN DISPOSABLE) ×2
LENS IOL TECNIS ITEC 20.0 (Intraocular Lens) ×2 IMPLANT
MARKER SKIN DUAL TIP RULER LAB (MISCELLANEOUS) ×2 IMPLANT
NDL RETROBULBAR .5 NSTRL (NEEDLE) IMPLANT
PACK CATARACT BRASINGTON (MISCELLANEOUS) ×2 IMPLANT
PACK EYE AFTER SURG (MISCELLANEOUS) ×2 IMPLANT
PACK OPTHALMIC (MISCELLANEOUS) ×2 IMPLANT
RING MALYGIN 7.0 (MISCELLANEOUS) IMPLANT
SUT ETHILON 10-0 CS-B-6CS-B-6 (SUTURE)
SUT VICRYL  9 0 (SUTURE)
SUT VICRYL 9 0 (SUTURE) IMPLANT
SUTURE EHLN 10-0 CS-B-6CS-B-6 (SUTURE) IMPLANT
SYR TB 1ML LUER SLIP (SYRINGE) ×2 IMPLANT
WATER STERILE IRR 250ML POUR (IV SOLUTION) ×2 IMPLANT
WIPE NON LINTING 3.25X3.25 (MISCELLANEOUS) ×2 IMPLANT

## 2016-03-15 NOTE — H&P (Signed)
  The History and Physical notes are on paper, have been signed, and are to be scanned. The patient remains stable and unchanged from the H&P.   Previous H&P reviewed, patient examined, and there are no changes.  Jody Wang 03/15/2016 7:42 AM

## 2016-03-15 NOTE — Transfer of Care (Signed)
Immediate Anesthesia Transfer of Care Note  Patient: Jody Wang  Procedure(s) Performed: Procedure(s) with comments: CATARACT EXTRACTION PHACO AND INTRAOCULAR LENS PLACEMENT (IOC)left eye (Left) - DIABETIC - oral meds  Patient Location: PACU  Anesthesia Type: MAC  Level of Consciousness: awake, alert  and patient cooperative  Airway and Oxygen Therapy: Patient Spontanous Breathing and Patient connected to supplemental oxygen  Post-op Assessment: Post-op Vital signs reviewed, Patient's Cardiovascular Status Stable, Respiratory Function Stable, Patent Airway and No signs of Nausea or vomiting  Post-op Vital Signs: Reviewed and stable  Complications: No apparent anesthesia complications

## 2016-03-15 NOTE — Anesthesia Postprocedure Evaluation (Signed)
Anesthesia Post Note  Patient: Jody Wang  Procedure(s) Performed: Procedure(s) (LRB): CATARACT EXTRACTION PHACO AND INTRAOCULAR LENS PLACEMENT (IOC)left eye (Left)  Patient location during evaluation: PACU Anesthesia Type: MAC Level of consciousness: awake and alert Pain management: pain level controlled Vital Signs Assessment: post-procedure vital signs reviewed and stable Respiratory status: spontaneous breathing, nonlabored ventilation, respiratory function stable and patient connected to nasal cannula oxygen Cardiovascular status: blood pressure returned to baseline and stable Postop Assessment: no signs of nausea or vomiting Anesthetic complications: no    Alisa Graff

## 2016-03-15 NOTE — Anesthesia Preprocedure Evaluation (Signed)
Anesthesia Evaluation  Patient identified by MRN, date of birth, ID band Patient awake    Reviewed: Allergy & Precautions, H&P , NPO status , Patient's Chart, lab work & pertinent test results, reviewed documented beta blocker date and time   Airway Mallampati: III  TM Distance: >3 FB Neck ROM: full    Dental  (+) Partial Lower   Pulmonary former smoker,    Pulmonary exam normal breath sounds clear to auscultation       Cardiovascular Exercise Tolerance: Good + Peripheral Vascular Disease   Rhythm:regular Rate:Normal     Neuro/Psych PSYCHIATRIC DISORDERS (depression) Depression negative neurological ROS     GI/Hepatic negative GI ROS, Neg liver ROS, GERD  ,  Endo/Other  negative endocrine ROSdiabetesHypothyroidism   Renal/GU negative Renal ROS  negative genitourinary   Musculoskeletal   Abdominal   Peds negative pediatric ROS (+)  Hematology negative hematology ROS (+)   Anesthesia Other Findings   Reproductive/Obstetrics negative OB ROS                             Anesthesia Physical Anesthesia Plan  ASA: II  Anesthesia Plan: MAC   Post-op Pain Management:    Induction:   Airway Management Planned:   Additional Equipment:   Intra-op Plan:   Post-operative Plan:   Informed Consent: I have reviewed the patients History and Physical, chart, labs and discussed the procedure including the risks, benefits and alternatives for the proposed anesthesia with the patient or authorized representative who has indicated his/her understanding and acceptance.   Dental Advisory Given  Plan Discussed with: CRNA  Anesthesia Plan Comments:         Anesthesia Quick Evaluation

## 2016-03-15 NOTE — Op Note (Signed)
OPERATIVE NOTE  Jody Wang [id number] 03/15/2016   PREOPERATIVE DIAGNOSIS:  Nuclear sclerotic cataract left eye. H25.12   POSTOPERATIVE DIAGNOSIS:    Nuclear sclerotic cataract left eye.     PROCEDURE:  Phacoemusification with posterior chamber intraocular lens placement of the left eye   LENS:   Implant Name Type Inv. Item Serial No. Manufacturer Lot No. LRB No. Used  LENS IOL DIOP 20.0 - [id number] Intraocular Lens LENS IOL DIOP 20.0 [id number] AMO   Left 1        ULTRASOUND TIME: 13  % of 1 minutes 15 seconds, CDE 9.3  SURGEON:  Wyonia Hough, MD   ANESTHESIA:  Topical with tetracaine drops and 2% Xylocaine jelly, augmented with 1% preservative-free intracameral lidocaine.    COMPLICATIONS:  None.   DESCRIPTION OF PROCEDURE:  The patient was identified in the holding room and transported to the operating room and placed in the supine position under the operating microscope.  The left eye was identified as the operative eye and it was prepped and draped in the usual sterile ophthalmic fashion.   A 1 millimeter clear-corneal paracentesis was made at the 1:30 position.  0.5 ml of preservative-free 1% lidocaine was injected into the anterior chamber.  The anterior chamber was filled with Viscoat viscoelastic.  A 2.4 millimeter keratome was used to make a near-clear corneal incision at the 10:30 position.  .  A curvilinear capsulorrhexis was made with a cystotome and capsulorrhexis forceps.  Balanced salt solution was used to hydrodissect and hydrodelineate the nucleus.   Phacoemulsification was then used in stop and chop fashion to remove the lens nucleus and epinucleus.  The remaining cortex was then removed using the irrigation and aspiration handpiece. Provisc was then placed into the capsular bag to distend it for lens placement.  A lens was then injected into the capsular bag.  The remaining viscoelastic was aspirated.   Wounds were hydrated with balanced salt  solution.  The anterior chamber was inflated to a physiologic pressure with balanced salt solution.  No wound leaks were noted. Cefuroxime 0.1 ml of a 10mg /ml solution was injected into the anterior chamber for a dose of 1 mg of intracameral antibiotic at the completion of the case.   Timolol and Brimonidine drops were applied to the eye.  The patient was taken to the recovery room in stable condition without complications of anesthesia or surgery.  Jody Wang 03/15/2016, 8:44 AM

## 2016-03-15 NOTE — Anesthesia Procedure Notes (Signed)
Procedure Name: MAC Performed by: Hikari Tripp Pre-anesthesia Checklist: Patient identified, Emergency Drugs available, Suction available, Timeout performed and Patient being monitored Patient Re-evaluated:Patient Re-evaluated prior to inductionOxygen Delivery Method: Nasal cannula Placement Confirmation: positive ETCO2     

## 2016-03-16 ENCOUNTER — Encounter: Payer: Self-pay | Admitting: Ophthalmology

## 2016-04-14 ENCOUNTER — Encounter: Payer: Self-pay | Admitting: Internal Medicine

## 2016-04-29 DIAGNOSIS — Z961 Presence of intraocular lens: Secondary | ICD-10-CM | POA: Diagnosis not present

## 2016-05-16 ENCOUNTER — Other Ambulatory Visit: Payer: Self-pay | Admitting: Internal Medicine

## 2016-05-16 NOTE — Telephone Encounter (Signed)
ok'd refill for xanax #90 with no refills.   

## 2016-05-16 NOTE — Telephone Encounter (Signed)
Okay to refill? Last seen in April & next appt on 05/18/16.

## 2016-05-17 NOTE — Telephone Encounter (Signed)
Rx faxed to [facility] 

## 2016-05-18 ENCOUNTER — Ambulatory Visit (INDEPENDENT_AMBULATORY_CARE_PROVIDER_SITE_OTHER): Payer: Commercial Managed Care - HMO | Admitting: Internal Medicine

## 2016-05-18 ENCOUNTER — Encounter: Payer: Self-pay | Admitting: Internal Medicine

## 2016-05-18 VITALS — BP 120/80 | HR 98 | Temp 98.4°F | Resp 18 | Ht 62.0 in | Wt 151.0 lb

## 2016-05-18 DIAGNOSIS — Z1239 Encounter for other screening for malignant neoplasm of breast: Secondary | ICD-10-CM

## 2016-05-18 DIAGNOSIS — M25569 Pain in unspecified knee: Secondary | ICD-10-CM | POA: Diagnosis not present

## 2016-05-18 DIAGNOSIS — Z91048 Other nonmedicinal substance allergy status: Secondary | ICD-10-CM

## 2016-05-18 DIAGNOSIS — E119 Type 2 diabetes mellitus without complications: Secondary | ICD-10-CM

## 2016-05-18 DIAGNOSIS — I6529 Occlusion and stenosis of unspecified carotid artery: Secondary | ICD-10-CM | POA: Diagnosis not present

## 2016-05-18 DIAGNOSIS — K219 Gastro-esophageal reflux disease without esophagitis: Secondary | ICD-10-CM

## 2016-05-18 DIAGNOSIS — F329 Major depressive disorder, single episode, unspecified: Secondary | ICD-10-CM

## 2016-05-18 DIAGNOSIS — E7800 Pure hypercholesterolemia, unspecified: Secondary | ICD-10-CM

## 2016-05-18 DIAGNOSIS — Z9109 Other allergy status, other than to drugs and biological substances: Secondary | ICD-10-CM

## 2016-05-18 DIAGNOSIS — F32A Depression, unspecified: Secondary | ICD-10-CM

## 2016-05-18 DIAGNOSIS — E039 Hypothyroidism, unspecified: Secondary | ICD-10-CM

## 2016-05-18 MED ORDER — METFORMIN HCL ER 500 MG PO TB24: 500.0000 mg | ORAL_TABLET | Freq: Two times a day (BID) | ORAL | Status: DC

## 2016-05-18 MED ORDER — DICLOFENAC SODIUM 50 MG PO TBEC: 50.0000 mg | DELAYED_RELEASE_TABLET | Freq: Two times a day (BID) | ORAL | Status: DC

## 2016-05-18 NOTE — Progress Notes (Signed)
Pre-visit discussion using our clinic review tool. No additional management support is needed unless otherwise documented below in the visit note.  

## 2016-05-18 NOTE — Progress Notes (Addendum)
Patient ID: Jody Wang, female   DOB: 1946-08-16, 70 y.o.   MRN: [id number]   Subjective:    Patient ID: Jody Wang, female    DOB: 1946/02/28, 70 y.o.   MRN: [id number]  HPI  Patient here for a scheduled follow up.  She has increased joint pains, specifically knee pain.  Has to take antiinflammatories to be able to function.  meloxicam did not work as well.  Feels diclofenac works the best.  Request refill on this medication.  Some nausea.  Feels related to taking her metformin.  If only takes one metformin, then does not have nausea.  Discussed changing to extended release form of metformin.  No chest pain.  No sob.  No acid reflux.  No abdominal pain or cramping.  Bowels stable.     Past Medical History  Diagnosis Date  . Diabetes mellitus without complication ([place])   . Hypercholesterolemia   . Depression   . Hypothyroidism   . Osteoarthritis of knee   . GERD (gastroesophageal reflux disease)   . Wears dentures     partial lower  . Heart murmur     slight - followed by PCP   Past Surgical History  Procedure Laterality Date  . Abdominal hysterectomy  1985    ovaries not removed  . Inner ear surgery    . Breast cyst excision Right   . Breast cyst excision Left   . Cataract extraction w/phaco Right 02/16/2016    Procedure: CATARACT EXTRACTION PHACO AND INTRAOCULAR LENS PLACEMENT (IOC) RIGHT ;  Surgeon: Leandrew Koyanagi, MD;  Location: [place];  Service: Ophthalmology;  Laterality: Right;  DIABETIC - oral meds  . Cataract extraction w/phaco Left 03/15/2016    Procedure: CATARACT EXTRACTION PHACO AND INTRAOCULAR LENS PLACEMENT (IOC)left eye;  Surgeon: Leandrew Koyanagi, MD;  Location: [place];  Service: Ophthalmology;  Laterality: Left;  DIABETIC - oral meds   Family History  Problem Relation Age of Onset  . Diabetes Father   . Coronary artery disease Father     s/p CABG  . Hypertension Father   . Hypercholesterolemia Father   .  Hypertension Brother   . Hypertension Sister   . Glaucoma Sister   . Diabetes      niece  . Breast cancer Maternal Aunt   . Breast cancer Paternal Aunt   . Cervical cancer Paternal Aunt   . Breast cancer Cousin    Social History   Social History  . Marital Status: Divorced    Spouse Name: N/A  . Number of Children: 3  . Years of Education: N/A   Social History Main Topics  . Smoking status: Former Smoker -- 1.00 packs/day for 23 years    Quit date: 11/06/1981  . Smokeless tobacco: Never Used  . Alcohol Use: 0.0 oz/week    0 Standard drinks or equivalent per week     Comment: rare  . Drug Use: No  . Sexual Activity: No   Other Topics Concern  . None   Social History Narrative    Outpatient Encounter Prescriptions as of 05/18/2016  Medication Sig  . ALPRAZolam (XANAX) 0.25 MG tablet TAKE 1 TABLET AT BEDTIME AS NEEDED FOR SLEEP  . FLUoxetine (PROZAC) 20 MG capsule TAKE 1 CAPSULE EVERY DAY  . fluticasone (FLONASE) 50 MCG/ACT nasal spray Place 2 sprays into both nostrils daily.  Marland Kitchen levocetirizine (XYZAL) 5 MG tablet Take 1 tablet (5 mg total) by mouth daily.  Marland Kitchen levothyroxine (SYNTHROID, LEVOTHROID) 88  MCG tablet TAKE 1 TABLET EVERY DAY  . pantoprazole (PROTONIX) 40 MG tablet TAKE 1 TABLET EVERY DAY  . pravastatin (PRAVACHOL) 40 MG tablet TAKE 1/2 TABLET EVERY DAY  . traZODone (DESYREL) 50 MG tablet TAKE 1 TABLET EVERY DAY  . [DISCONTINUED] etodolac (LODINE) 400 MG tablet Tablet q day  . [DISCONTINUED] metFORMIN (GLUCOPHAGE) 500 MG tablet TAKE 2 TABLETS IN THE MORNING, TAKE 1 TABLET AT LUNCH,  AND TAKE 2 TABLETS EVERY EVENING  . [DISCONTINUED] sitaGLIPtin (JANUVIA) 100 MG tablet Take 1 tablet (100 mg total) by mouth daily.  . carbamide peroxide (DEBROX) 6.5 % otic solution Place 5 drops into both ears 2 (two) times daily. (Patient not taking: Reported on 05/18/2016)  . diclofenac (VOLTAREN) 50 MG EC tablet Take 1 tablet (50 mg total) by mouth 2 (two) times daily.  . metFORMIN  (GLUCOPHAGE XR) 500 MG 24 hr tablet Take 1 tablet (500 mg total) by mouth 2 (two) times daily.   Facility-Administered Encounter Medications as of 05/18/2016  Medication  . glucose blood test strip STRP 1 each    Review of Systems  Constitutional: Negative for appetite change and unexpected weight change.  HENT: Negative for congestion and sinus pressure.   Respiratory: Negative for cough, chest tightness and shortness of breath.   Cardiovascular: Negative for chest pain, palpitations and leg swelling.  Gastrointestinal: Negative for nausea, abdominal pain and diarrhea.  Genitourinary: Negative for dysuria and difficulty urinating.  Musculoskeletal: Negative for myalgias.       Knee pain as outlined.    Skin: Negative for color change and rash.  Neurological: Negative for dizziness, light-headedness and headaches.  Psychiatric/Behavioral: Negative for dysphoric mood and agitation.       Objective:    Physical Exam  Constitutional: She appears well-developed and well-nourished. No distress.  HENT:  Nose: Nose normal.  Mouth/Throat: Oropharynx is clear and moist.  Neck: Neck supple. No thyromegaly present.  Cardiovascular: Normal rate and regular rhythm.   Pulmonary/Chest: Breath sounds normal. No respiratory distress. She has no wheezes.  Abdominal: Soft. Bowel sounds are normal. There is no tenderness.  Musculoskeletal: She exhibits no edema or tenderness.  Lymphadenopathy:    She has no cervical adenopathy.  Skin: No rash noted. No erythema.  Psychiatric: She has a normal mood and affect. Her behavior is normal.    BP 120/80 mmHg  Pulse 98  Temp(Src) 98.4 F (36.9 C) (Oral)  Resp 18  Ht 5\' 2"  (1.575 m)  Wt 151 lb (68.493 kg)  BMI 27.61 kg/m2  SpO2 95% Wt Readings from Last 3 Encounters:  05/18/16 151 lb (68.493 kg)  03/15/16 149 lb (67.586 kg)  03/03/16 150 lb 3.2 oz (68.13 kg)     Lab Results  Component Value Date   WBC 9.9 01/10/2016   HGB 12.4 01/10/2016     HCT 38.0 01/10/2016   PLT 434.0* 01/10/2016   GLUCOSE 161* 01/11/2016   CHOL 242* 01/10/2016   TRIG 161.0* 01/10/2016   HDL 42.40 01/10/2016   LDLDIRECT 188.8 06/30/2013   LDLCALC 167* 01/10/2016   ALT 16 01/10/2016   AST 15 01/10/2016   NA 138 01/11/2016   K 4.7 01/11/2016   CL 104 01/11/2016   CREATININE 0.85 01/11/2016   BUN 14 01/11/2016   CO2 20 01/11/2016   TSH 2.36 01/10/2016   HGBA1C 7.2* 01/10/2016   MICROALBUR 1.5 01/10/2016       Assessment & Plan:   Problem List Items Addressed This Visit  Carotid stenosis    Had some stenosis on carotid ultrasound.  Seeing vascular surgery.  They are following.  Continue aspirin.        Depression    On prozac.  Stable.        Diabetes mellitus ([place])    Low carb diet and exercise.  We discussed eating earlier.  Will change metformin to extended release.  Have her take bid.  If tolerates, the increased to 1000 bid if sugars are not controlled.        Relevant Medications   metFORMIN (GLUCOPHAGE XR) 500 MG 24 hr tablet   Environmental allergies    Continue nasal spray as directed.  Follow.  Stable.       GERD (gastroesophageal reflux disease)    Controlled on current regimen.  Follow.       Hypercholesterolemia    Low cholesterol diet and exercise.  On pravastatin.  Follow lipid panel and liver function tests.        Hypothyroidism    On thyroid replacement.  Follow tsh.       Knee pain    Persistent.  Diclofenac works the best for her.  We discussed possible side effects of the medication and risks of continued medication.  She needs the medication to function.  Follow metabolic panel.  Refer to ortho for further evaluation and treatment.         Relevant Orders   Ambulatory referral to Orthopedic Surgery    Other Visit Diagnoses    Screening breast examination    -  Primary    Relevant Orders    MM DIGITAL SCREENING BILATERAL        Einar Pheasant, MD

## 2016-05-20 ENCOUNTER — Encounter: Payer: Self-pay | Admitting: Internal Medicine

## 2016-05-20 NOTE — Assessment & Plan Note (Signed)
On prozac.  Stable.   

## 2016-05-20 NOTE — Assessment & Plan Note (Addendum)
Persistent.  Diclofenac works the best for her.  We discussed possible side effects of the medication and risks of continued medication.  She needs the medication to function.  Follow metabolic panel.  Refer to ortho for further evaluation and treatment.

## 2016-05-20 NOTE — Assessment & Plan Note (Signed)
Low cholesterol diet and exercise.  On pravastatin.  Follow lipid panel and liver function tests.   

## 2016-05-20 NOTE — Assessment & Plan Note (Signed)
Had some stenosis on carotid ultrasound.  Seeing vascular surgery.  They are following.  Continue aspirin.

## 2016-05-20 NOTE — Assessment & Plan Note (Signed)
Low carb diet and exercise.  We discussed eating earlier.  Will change metformin to extended release.  Have her take bid.  If tolerates, the increased to 1000 bid if sugars are not controlled.

## 2016-05-20 NOTE — Addendum Note (Signed)
Addended by: Alisa Graff on: 05/20/2016 09:59 PM   Modules accepted: Orders

## 2016-05-20 NOTE — Assessment & Plan Note (Signed)
On thyroid replacement.  Follow tsh.  

## 2016-05-20 NOTE — Assessment & Plan Note (Signed)
Continue nasal spray as directed.  Follow.  Stable.

## 2016-05-20 NOTE — Assessment & Plan Note (Signed)
Controlled on current regimen.  Follow.  

## 2016-06-22 ENCOUNTER — Telehealth: Payer: Self-pay | Admitting: Internal Medicine

## 2016-06-22 ENCOUNTER — Encounter: Payer: Self-pay | Admitting: Internal Medicine

## 2016-06-22 NOTE — Telephone Encounter (Signed)
Please advise 

## 2016-06-22 NOTE — Telephone Encounter (Signed)
If her sugars are remaining between 190-210 and if she is taking metformin 500mg  bid, then I would change to 500mg  two tablets bid.  Check and record blood sugars.  Send in readings.

## 2016-06-22 NOTE — Telephone Encounter (Signed)
Pt called stating she does not need anymore test strips she has 3 boxes already.   Pt blood sugar is at 190 -210 everyday. Pt wants to know what else she needs to do? Pt states she will continue what was decided on with the medication.   Call pt @ [phone]. Thank you!

## 2016-06-22 NOTE — Telephone Encounter (Signed)
Left message for patient to return call back.  

## 2016-06-23 NOTE — Telephone Encounter (Signed)
Left message for patient to return call back.  

## 2016-06-26 ENCOUNTER — Other Ambulatory Visit: Payer: Self-pay | Admitting: Internal Medicine

## 2016-06-27 NOTE — Telephone Encounter (Signed)
Left message for patient to return call back.  

## 2016-06-27 NOTE — Telephone Encounter (Signed)
Patient has been notified.  Patient understood and had no questions, comments, or concerns at this time

## 2016-06-30 ENCOUNTER — Ambulatory Visit (INDEPENDENT_AMBULATORY_CARE_PROVIDER_SITE_OTHER): Payer: Commercial Managed Care - HMO | Admitting: Internal Medicine

## 2016-06-30 ENCOUNTER — Encounter: Payer: Self-pay | Admitting: Internal Medicine

## 2016-06-30 DIAGNOSIS — K219 Gastro-esophageal reflux disease without esophagitis: Secondary | ICD-10-CM

## 2016-06-30 DIAGNOSIS — E7800 Pure hypercholesterolemia, unspecified: Secondary | ICD-10-CM

## 2016-06-30 DIAGNOSIS — B029 Zoster without complications: Secondary | ICD-10-CM

## 2016-06-30 DIAGNOSIS — E039 Hypothyroidism, unspecified: Secondary | ICD-10-CM

## 2016-06-30 DIAGNOSIS — F329 Major depressive disorder, single episode, unspecified: Secondary | ICD-10-CM

## 2016-06-30 DIAGNOSIS — Z0000 Encounter for general adult medical examination without abnormal findings: Secondary | ICD-10-CM | POA: Diagnosis not present

## 2016-06-30 DIAGNOSIS — F32A Depression, unspecified: Secondary | ICD-10-CM

## 2016-06-30 DIAGNOSIS — E119 Type 2 diabetes mellitus without complications: Secondary | ICD-10-CM

## 2016-06-30 MED ORDER — GABAPENTIN 100 MG PO CAPS: 100.0000 mg | ORAL_CAPSULE | Freq: Three times a day (TID) | ORAL | 1 refills | Status: DC

## 2016-06-30 MED ORDER — VALACYCLOVIR HCL 1 G PO TABS: 1000.0000 mg | ORAL_TABLET | Freq: Three times a day (TID) | ORAL | 0 refills | Status: DC

## 2016-06-30 NOTE — Progress Notes (Addendum)
Patient ID: Jody Wang, female   DOB: Mar 16, 1946, 70 y.o.   MRN: [id number]   Subjective:    Patient ID: Jody Wang, female    DOB: 07-28-1946, 70 y.o.   MRN: [id number]  HPI  Patient here as a work in with concerns regarding a painful rash.  Present x 2 weeks.  Increased pain.  Rash under breast and extends around to her back.  No sob.  Eating and drinking well.  Just started two metformin bid.  Sugars still elevated, but starting to decrease.  Diet and exercise.  No abdominal pain.  No nausea or vomiting.     Past Medical History:  Diagnosis Date  . Depression   . Diabetes mellitus without complication ([place])   . GERD (gastroesophageal reflux disease)   . Heart murmur    slight - followed by PCP  . Hypercholesterolemia   . Hypothyroidism   . Osteoarthritis of knee   . Wears dentures    partial lower   Past Surgical History:  Procedure Laterality Date  . ABDOMINAL HYSTERECTOMY  1985   ovaries not removed  . BREAST CYST EXCISION Right   . BREAST CYST EXCISION Left   . CATARACT EXTRACTION W/PHACO Right 02/16/2016   Procedure: CATARACT EXTRACTION PHACO AND INTRAOCULAR LENS PLACEMENT ([place]) RIGHT ;  Surgeon: Leandrew Koyanagi, MD;  Location: [facility];  Service: Ophthalmology;  Laterality: Right;  DIABETIC - oral meds  . CATARACT EXTRACTION W/PHACO Left 03/15/2016   Procedure: CATARACT EXTRACTION PHACO AND INTRAOCULAR LENS PLACEMENT (IOC)left eye;  Surgeon: Leandrew Koyanagi, MD;  Location: [facility];  Service: Ophthalmology;  Laterality: Left;  DIABETIC - oral meds  . INNER EAR SURGERY     Family History  Problem Relation Age of Onset  . Diabetes Father   . Coronary artery disease Father     s/p CABG  . Hypertension Father   . Hypercholesterolemia Father   . Hypertension Brother   . Hypertension Sister   . Glaucoma Sister   . Diabetes      niece  . Breast cancer Maternal Aunt   . Breast cancer Paternal Aunt   . Cervical cancer  Paternal Aunt   . Breast cancer Cousin    Social History   Social History  . Marital status: Divorced    Spouse name: N/A  . Number of children: 3  . Years of education: N/A   Social History Main Topics  . Smoking status: Former Smoker    Packs/day: 1.00    Years: 23.00    Quit date: 11/06/1981  . Smokeless tobacco: Never Used  . Alcohol use 0.0 oz/week     Comment: rare  . Drug use: No  . Sexual activity: No   Other Topics Concern  . None   Social History Narrative  . None    Outpatient Encounter Prescriptions as of 06/30/2016  Medication Sig  . ALPRAZolam (XANAX) 0.25 MG tablet TAKE 1 TABLET AT BEDTIME AS NEEDED FOR SLEEP  . carbamide peroxide (DEBROX) 6.5 % otic solution Place 5 drops into both ears 2 (two) times daily.  . diclofenac (VOLTAREN) 50 MG EC tablet Take 1 tablet (50 mg total) by mouth 2 (two) times daily.  Marland Kitchen FLUoxetine (PROZAC) 20 MG capsule TAKE 1 CAPSULE EVERY DAY  . fluticasone (FLONASE) 50 MCG/ACT nasal spray Place 2 sprays into both nostrils daily.  Marland Kitchen levocetirizine (XYZAL) 5 MG tablet Take 1 tablet (5 mg total) by mouth daily.  Marland Kitchen levothyroxine (SYNTHROID, LEVOTHROID)  88 MCG tablet TAKE 1 TABLET EVERY DAY  . metFORMIN (GLUCOPHAGE XR) 500 MG 24 hr tablet Take 1 tablet (500 mg total) by mouth 2 (two) times daily. (Patient taking differently: Take 1,000 mg by mouth 2 (two) times daily. )  . pantoprazole (PROTONIX) 40 MG tablet TAKE 1 TABLET EVERY DAY  . pravastatin (PRAVACHOL) 40 MG tablet TAKE 1/2 TABLET EVERY DAY  . traZODone (DESYREL) 50 MG tablet TAKE 1 TABLET EVERY DAY  . gabapentin (NEURONTIN) 100 MG capsule Take 1 capsule (100 mg total) by mouth 3 (three) times daily.  . valACYclovir (VALTREX) 1000 MG tablet Take 1 tablet (1,000 mg total) by mouth 3 (three) times daily.   Facility-Administered Encounter Medications as of 06/30/2016  Medication  . glucose blood test strip STRP 1 each    Review of Systems  Constitutional: Negative for appetite  change and unexpected weight change.  Respiratory: Negative for shortness of breath.   Gastrointestinal: Negative for nausea and vomiting.  Musculoskeletal:       Pain under her breast and extends around to her back.    Skin: Positive for rash. Negative for color change.  Psychiatric/Behavioral: Negative for agitation and dysphoric mood.       Objective:    Physical Exam  Constitutional: She appears well-developed and well-nourished. No distress.  Cardiovascular: Normal rate and regular rhythm.   Pulmonary/Chest: Effort normal and breath sounds normal. No respiratory distress.  Skin:  Erythematous based rash with vesicles under right breast and extends around her back.  Rash c/w shingles.    Psychiatric: She has a normal mood and affect. Her behavior is normal.    BP 120/70   Pulse 95   Temp 98.3 F (36.8 C) (Oral)   Resp 18   Ht _0  (1.575 m)   Wt 148 lb (67.1 kg)   SpO2 98%   BMI 27.07 kg/m  Wt Readings from Last 3 Encounters:  06/30/16 148 lb (67.1 kg)  05/18/16 151 lb (68.5 kg)  03/15/16 149 lb (67.6 kg)     Lab Results  Component Value Date   WBC 9.9 01/10/2016   HGB 12.4 01/10/2016   HCT 38.0 01/10/2016   PLT 434.0 (H) 01/10/2016   GLUCOSE 161 (H) 01/11/2016   CHOL 242 (H) 01/10/2016   TRIG 161.0 (H) 01/10/2016   HDL 42.40 01/10/2016   LDLDIRECT 188.8 06/30/2013   LDLCALC 167 (H) 01/10/2016   ALT 16 01/10/2016   AST 15 01/10/2016   NA 138 01/11/2016   K 4.7 01/11/2016   CL 104 01/11/2016   CREATININE 0.85 01/11/2016   BUN 14 01/11/2016   CO2 20 01/11/2016   TSH 2.36 01/10/2016   HGBA1C 7.2 (H) 01/10/2016   MICROALBUR 1.5 01/10/2016       Assessment & Plan:   Problem List Items Addressed This Visit    Depression    On prozac.  Follow.  Doing better.        Diabetes mellitus ([place])    Low carb diet and exercise.  Follow met b and a1c.       GERD (gastroesophageal reflux disease)    Controlled on protonix.        Health care  maintenance    Mammogram 09/16/15 - Birads I.       Hypercholesterolemia    Low cholesterol diet and exercise.  Follow lipid panel.   On pravastatin.       Hypothyroidism    On thyroid replacement.  Follow tsh.  Shingles    Rash under her breast and extends around to her back.  Increased pain.  Rash c/w shingles.  Treat with valtrex.  Start gabapentin 172m tid.  Titrate up as tolerated.  Follow.       Relevant Medications   valACYclovir (VALTREX) 1000 MG tablet    Other Visit Diagnoses   None.      SEinar Pheasant MD

## 2016-06-30 NOTE — Progress Notes (Signed)
Pre-visit discussion using our clinic review tool. No additional management support is needed unless otherwise documented below in the visit note.  

## 2016-07-02 DIAGNOSIS — B029 Zoster without complications: Secondary | ICD-10-CM | POA: Insufficient documentation

## 2016-07-02 NOTE — Assessment & Plan Note (Signed)
Mammogram 09/16/15 - Birads I.

## 2016-07-02 NOTE — Assessment & Plan Note (Signed)
Low carb diet and exercise.  Follow met b and a1c.  

## 2016-07-02 NOTE — Assessment & Plan Note (Signed)
Controlled on protonix.   

## 2016-07-02 NOTE — Assessment & Plan Note (Deleted)
Sees gyn.  Breast, pelvic, pap smears and mammograms - followed by gyn.

## 2016-07-02 NOTE — Assessment & Plan Note (Signed)
On thyroid replacement.  Follow tsh.  

## 2016-07-02 NOTE — Assessment & Plan Note (Signed)
Rash under her breast and extends around to her back.  Increased pain.  Rash c/w shingles.  Treat with valtrex.  Start gabapentin 100mg  tid.  Titrate up as tolerated.  Follow.

## 2016-07-02 NOTE — Assessment & Plan Note (Addendum)
Low cholesterol diet and exercise.  Follow lipid panel.  On pravastatin.   

## 2016-07-02 NOTE — Assessment & Plan Note (Signed)
On prozac.  Follow.  Doing better.

## 2016-07-12 ENCOUNTER — Telehealth: Payer: Self-pay | Admitting: *Deleted

## 2016-07-12 NOTE — Telephone Encounter (Signed)
Pt has requested a call for a medication consult Pt contact [phone]

## 2016-07-13 ENCOUNTER — Ambulatory Visit: Payer: Commercial Managed Care - HMO | Admitting: Internal Medicine

## 2016-07-13 NOTE — Telephone Encounter (Signed)
Left message for patient to return call back.  

## 2016-07-13 NOTE — Telephone Encounter (Signed)
Pt called back returning your call. Pt states she will be home til about 12:30pm. Thank you!

## 2016-07-13 NOTE — Telephone Encounter (Signed)
Since patient is taking 1000MG  Metformin twice daily. Sugars are staying around 200's.  Patient cant take Januvia due to price. Patient may need refill on medication since upping dose to [facility]. Please advise.

## 2016-07-14 NOTE — Telephone Encounter (Signed)
Spoke with patient and she will able to leave work and come, thanks no refill needed prior to Monday.

## 2016-07-14 NOTE — Telephone Encounter (Signed)
See if she can come in on 07/17/16 to go over sugars and discuss treatment options.  I can see her Monday pm (there is an open slot).  Please notify her and place her in the open spot Monday pm.  (it is being held).  Also, see if she needs refill on metformin prior to that appt.

## 2016-07-17 ENCOUNTER — Ambulatory Visit (INDEPENDENT_AMBULATORY_CARE_PROVIDER_SITE_OTHER): Payer: Commercial Managed Care - HMO | Admitting: Internal Medicine

## 2016-07-17 ENCOUNTER — Encounter: Payer: Self-pay | Admitting: Internal Medicine

## 2016-07-17 DIAGNOSIS — F32A Depression, unspecified: Secondary | ICD-10-CM

## 2016-07-17 DIAGNOSIS — B029 Zoster without complications: Secondary | ICD-10-CM | POA: Diagnosis not present

## 2016-07-17 DIAGNOSIS — E7800 Pure hypercholesterolemia, unspecified: Secondary | ICD-10-CM | POA: Diagnosis not present

## 2016-07-17 DIAGNOSIS — E039 Hypothyroidism, unspecified: Secondary | ICD-10-CM | POA: Diagnosis not present

## 2016-07-17 DIAGNOSIS — E119 Type 2 diabetes mellitus without complications: Secondary | ICD-10-CM

## 2016-07-17 DIAGNOSIS — M25569 Pain in unspecified knee: Secondary | ICD-10-CM | POA: Diagnosis not present

## 2016-07-17 DIAGNOSIS — F329 Major depressive disorder, single episode, unspecified: Secondary | ICD-10-CM

## 2016-07-17 DIAGNOSIS — H6123 Impacted cerumen, bilateral: Secondary | ICD-10-CM

## 2016-07-17 DIAGNOSIS — I6529 Occlusion and stenosis of unspecified carotid artery: Secondary | ICD-10-CM

## 2016-07-17 MED ORDER — GLIPIZIDE ER 2.5 MG PO TB24: 2.5000 mg | ORAL_TABLET | Freq: Every day | ORAL | 1 refills | Status: DC

## 2016-07-17 MED ORDER — METFORMIN HCL ER 500 MG PO TB24: 1000.0000 mg | ORAL_TABLET | Freq: Two times a day (BID) | ORAL | 1 refills | Status: DC

## 2016-07-17 MED ORDER — DICLOFENAC SODIUM 50 MG PO TBEC: 50.0000 mg | DELAYED_RELEASE_TABLET | Freq: Two times a day (BID) | ORAL | 1 refills | Status: DC

## 2016-07-17 NOTE — Progress Notes (Signed)
Patient ID: Jody Wang, female   DOB: 10/25/1946, 70 y.o.   MRN: [id number]   Subjective:    Patient ID: Jody Wang, female    DOB: 08-Sep-1946, 70 y.o.   MRN: [id number]  HPI  Patient here as a work in to discuss her elevated blood sugars.  Persistent elevation.  Her sugars are averaging 180-220.  Taking metformin and tolerating.  Unable to afford [place].  Does not want to do injections.  Has tried sulfonylurea previously.  Dropped her sugar at that time.  Wants to retry.  Discussed low dose glipizide.  Cost is an issue.  No chest pain.  Breathing stable.  Describes ear fullness.  No nasal congestion.  No fever.  Ears just feel stopped up.  Shingles rash is better.     Past Medical History:  Diagnosis Date  . Depression   . Diabetes mellitus without complication ([place])   . GERD (gastroesophageal reflux disease)   . Heart murmur    slight - followed by PCP  . Hypercholesterolemia   . Hypothyroidism   . Osteoarthritis of knee   . Wears dentures    partial lower   Past Surgical History:  Procedure Laterality Date  . ABDOMINAL HYSTERECTOMY  1985   ovaries not removed  . BREAST CYST EXCISION Right   . BREAST CYST EXCISION Left   . CATARACT EXTRACTION W/PHACO Right 02/16/2016   Procedure: CATARACT EXTRACTION PHACO AND INTRAOCULAR LENS PLACEMENT ([place]) RIGHT ;  Surgeon: Leandrew Koyanagi, MD;  Location: [facility];  Service: Ophthalmology;  Laterality: Right;  DIABETIC - oral meds  . CATARACT EXTRACTION W/PHACO Left 03/15/2016   Procedure: CATARACT EXTRACTION PHACO AND INTRAOCULAR LENS PLACEMENT ([facility])left eye;  Surgeon: Leandrew Koyanagi, MD;  Location: [facility];  Service: Ophthalmology;  Laterality: Left;  DIABETIC - oral meds  . INNER EAR SURGERY     Family History  Problem Relation Age of Onset  . Diabetes Father   . Coronary artery disease Father     s/p CABG  . Hypertension Father   . Hypercholesterolemia Father   . Hypertension Brother     . Hypertension Sister   . Glaucoma Sister   . Diabetes      niece  . Breast cancer Maternal Aunt   . Breast cancer Paternal Aunt   . Cervical cancer Paternal Aunt   . Breast cancer Cousin    Social History   Social History  . Marital status: Divorced    Spouse name: N/A  . Number of children: 3  . Years of education: N/A   Social History Main Topics  . Smoking status: Former Smoker    Packs/day: 1.00    Years: 23.00    Quit date: 11/06/1981  . Smokeless tobacco: Never Used  . Alcohol use 0.0 oz/week     Comment: rare  . Drug use: No  . Sexual activity: No   Other Topics Concern  . None   Social History Narrative  . None    Outpatient Encounter Prescriptions as of 07/17/2016  Medication Sig  . ALPRAZolam (XANAX) 0.25 MG tablet TAKE 1 TABLET AT BEDTIME AS NEEDED FOR SLEEP  . carbamide peroxide (DEBROX) 6.5 % otic solution Place 5 drops into both ears 2 (two) times daily.  . diclofenac (VOLTAREN) 50 MG EC tablet Take 1 tablet (50 mg total) by mouth 2 (two) times daily.  Marland Kitchen FLUoxetine (PROZAC) 20 MG capsule TAKE 1 CAPSULE EVERY DAY  . fluticasone (FLONASE) 50 MCG/ACT nasal  spray Place 2 sprays into both nostrils daily.  Marland Kitchen gabapentin (NEURONTIN) 100 MG capsule Take 1 capsule (100 mg total) by mouth 3 (three) times daily.  Marland Kitchen levocetirizine (XYZAL) 5 MG tablet Take 1 tablet (5 mg total) by mouth daily.  Marland Kitchen levothyroxine (SYNTHROID, LEVOTHROID) 88 MCG tablet TAKE 1 TABLET EVERY DAY  . metFORMIN (GLUCOPHAGE XR) 500 MG 24 hr tablet Take 2 tablets (1,000 mg total) by mouth 2 (two) times daily.  . pantoprazole (PROTONIX) 40 MG tablet TAKE 1 TABLET EVERY DAY  . pravastatin (PRAVACHOL) 40 MG tablet TAKE 1/2 TABLET EVERY DAY  . traZODone (DESYREL) 50 MG tablet TAKE 1 TABLET EVERY DAY  . valACYclovir (VALTREX) 1000 MG tablet Take 1 tablet (1,000 mg total) by mouth 3 (three) times daily.  . [DISCONTINUED] diclofenac (VOLTAREN) 50 MG EC tablet Take 1 tablet (50 mg total) by mouth 2 (two)  times daily.  . [DISCONTINUED] metFORMIN (GLUCOPHAGE XR) 500 MG 24 hr tablet Take 1 tablet (500 mg total) by mouth 2 (two) times daily. (Patient taking differently: Take 1,000 mg by mouth 2 (two) times daily. )  . glipiZIDE (GLUCOTROL XL) 2.5 MG 24 hr tablet Take 1 tablet (2.5 mg total) by mouth daily with breakfast.   Facility-Administered Encounter Medications as of 07/17/2016  Medication  . glucose blood test strip STRP 1 each    Review of Systems  Constitutional: Negative for appetite change and unexpected weight change.  HENT: Negative for congestion and sinus pressure.        Ear fullness as outlined.   Respiratory: Negative for cough, chest tightness and shortness of breath.   Cardiovascular: Negative for chest pain, palpitations and leg swelling.  Gastrointestinal: Negative for abdominal pain, diarrhea, nausea and vomiting.  Genitourinary: Negative for difficulty urinating and dysuria.  Musculoskeletal:       Persistent joint pain requiring diclofenac.    Skin:       Shingles rash improved.    Neurological: Negative for dizziness, light-headedness and headaches.  Psychiatric/Behavioral: Negative for agitation and dysphoric mood.       Objective:    Physical Exam  Constitutional: She appears well-developed and well-nourished. No distress.  HENT:  Nose: Nose normal.  Mouth/Throat: Oropharynx is clear and moist.  Cerumen impaction.    Neck: Neck supple. No thyromegaly present.  Cardiovascular: Normal rate and regular rhythm.   Pulmonary/Chest: Breath sounds normal. No respiratory distress. She has no wheezes.  Abdominal: Soft. Bowel sounds are normal. There is no tenderness.  Musculoskeletal: She exhibits no edema or tenderness.  Lymphadenopathy:    She has no cervical adenopathy.  Skin: No rash noted. No erythema.  Psychiatric: She has a normal mood and affect. Her behavior is normal.    BP 118/80   Pulse 86   Temp 97.6 F (36.4 C) (Oral)   Wt 151 lb (68.5 kg)    SpO2 97%   BMI 27.62 kg/m  Wt Readings from Last 3 Encounters:  07/17/16 151 lb (68.5 kg)  06/30/16 148 lb (67.1 kg)  05/18/16 151 lb (68.5 kg)     Lab Results  Component Value Date   WBC 9.9 01/10/2016   HGB 12.4 01/10/2016   HCT 38.0 01/10/2016   PLT 434.0 (H) 01/10/2016   GLUCOSE 161 (H) 01/11/2016   CHOL 242 (H) 01/10/2016   TRIG 161.0 (H) 01/10/2016   HDL 42.40 01/10/2016   LDLDIRECT 188.8 06/30/2013   LDLCALC 167 (H) 01/10/2016   ALT 16 01/10/2016   AST 15 01/10/2016  NA 138 01/11/2016   K 4.7 01/11/2016   CL 104 01/11/2016   CREATININE 0.85 01/11/2016   BUN 14 01/11/2016   CO2 20 01/11/2016   TSH 2.36 01/10/2016   HGBA1C 7.2 (H) 01/10/2016   MICROALBUR 1.5 01/10/2016       Assessment & Plan:   Problem List Items Addressed This Visit    Carotid stenosis    Seeing vascular surgery.  Aspirin.  Follow.       Cerumen impaction    Debrox as directed.  Declined to return for ear irrigation.        Depression    On prozac.  Stable.       Diabetes mellitus ([place])    Discussed with her today.  Sugars as outlined.  Still elevated.  On metformin.  Tolerating extended release better.  Discussed treatment options.  Wants to avoid injections.  Unable to afford [place].  Cost is an issues.  Start glipizide 2.5mg .  Follow.       Relevant Medications   metFORMIN (GLUCOPHAGE XR) 500 MG 24 hr tablet   glipiZIDE (GLUCOTROL XL) 2.5 MG 24 hr tablet   Other Relevant Orders   CBC with Differential/Platelet   Hemoglobin 123456   Basic metabolic panel   Hypercholesterolemia    On pravastatin.  Low cholesterol diet and exercise.  Follow lipid panel.        Relevant Orders   Lipid panel   Hepatic function panel   Hypothyroidism    On thyroid replacement.  Follow tsh.       Knee pain    Various joint pains.  Taking diclofenac.  Discussed possible side effects and risks of continued antiinflammatory meds.  Follow.       Shingles    Rash improved.  Pain controlled.   Follow.        Other Visit Diagnoses   None.      Einar Pheasant, MD

## 2016-07-17 NOTE — Progress Notes (Signed)
Pre visit review using our clinic review tool, if applicable. No additional management support is needed unless otherwise documented below in the visit note. 

## 2016-07-23 ENCOUNTER — Encounter: Payer: Self-pay | Admitting: Internal Medicine

## 2016-07-23 NOTE — Assessment & Plan Note (Signed)
Seeing vascular surgery.  Aspirin.  Follow.

## 2016-07-23 NOTE — Assessment & Plan Note (Signed)
Various joint pains.  Taking diclofenac.  Discussed possible side effects and risks of continued antiinflammatory meds.  Follow.

## 2016-07-23 NOTE — Assessment & Plan Note (Signed)
On thyroid replacement.  Follow tsh.  

## 2016-07-23 NOTE — Assessment & Plan Note (Signed)
Discussed with her today.  Sugars as outlined.  Still elevated.  On metformin.  Tolerating extended release better.  Discussed treatment options.  Wants to avoid injections.  Unable to afford [place].  Cost is an issues.  Start glipizide 2.5mg .  Follow.

## 2016-07-23 NOTE — Assessment & Plan Note (Signed)
On prozac.  Stable.   

## 2016-07-23 NOTE — Assessment & Plan Note (Signed)
On pravastatin.  Low cholesterol diet and exercise.  Follow lipid panel.   

## 2016-07-23 NOTE — Assessment & Plan Note (Signed)
Rash improved.  Pain controlled.  Follow.

## 2016-07-23 NOTE — Assessment & Plan Note (Signed)
Debrox as directed.  Declined to return for ear irrigation.

## 2016-08-16 ENCOUNTER — Other Ambulatory Visit: Payer: Self-pay | Admitting: Internal Medicine

## 2016-08-16 NOTE — Telephone Encounter (Signed)
Pt needs a refill on glipiZIDE (GLUCOTROL XL) 2.5 MG 24 hr tablet.Durene Cal to [facility]

## 2016-08-16 NOTE — Telephone Encounter (Signed)
This was started at last visit, please advise for refill it was only sent for 30days then. Thanks OV was 9/11.

## 2016-08-17 NOTE — Telephone Encounter (Signed)
Confirm with pt that she is tolerating the glipzide - without low sugars.  If so, ok to send in rx to [facility] (#90 with one refill).

## 2016-08-17 NOTE — Telephone Encounter (Signed)
Spoke with the patient Taking the medication once in the morning after breakfast and one after dinner, BS running 145.  So she is taking a total of 5mg  a day.  Please advise, thanks

## 2016-08-18 MED ORDER — GLIPIZIDE ER 2.5 MG PO TB24: 2.5000 mg | ORAL_TABLET | Freq: Every day | ORAL | 1 refills | Status: DC

## 2016-08-18 NOTE — Telephone Encounter (Signed)
Patient was given the advised statement of Dr. Nicki Reaper, with understanding, she requested the Rx be sent to [facility]  Pt contact [phone]

## 2016-08-18 NOTE — Telephone Encounter (Signed)
I had instructed her to take just one per day.  I would prefer her to change back to one per day and let us follow the blood sugars and see how they do.  She needs to take the one per day for breakfast.  Let me know if any problems.  Ok to send in rx for 2.5mg  q day - glipizide.

## 2016-08-18 NOTE — Telephone Encounter (Signed)
Left a VM to reach patient.

## 2016-08-18 NOTE — Telephone Encounter (Signed)
Sent to [facility] 

## 2016-08-25 ENCOUNTER — Other Ambulatory Visit: Payer: Self-pay | Admitting: Internal Medicine

## 2016-08-25 ENCOUNTER — Encounter (INDEPENDENT_AMBULATORY_CARE_PROVIDER_SITE_OTHER): Payer: Self-pay

## 2016-08-25 ENCOUNTER — Ambulatory Visit (INDEPENDENT_AMBULATORY_CARE_PROVIDER_SITE_OTHER): Payer: Self-pay | Admitting: Vascular Surgery

## 2016-08-29 DIAGNOSIS — G8929 Other chronic pain: Secondary | ICD-10-CM | POA: Diagnosis not present

## 2016-08-29 DIAGNOSIS — M25561 Pain in right knee: Secondary | ICD-10-CM | POA: Diagnosis not present

## 2016-08-29 DIAGNOSIS — M1711 Unilateral primary osteoarthritis, right knee: Secondary | ICD-10-CM | POA: Insufficient documentation

## 2016-09-07 ENCOUNTER — Other Ambulatory Visit: Payer: Self-pay | Admitting: Internal Medicine

## 2016-09-18 ENCOUNTER — Other Ambulatory Visit (INDEPENDENT_AMBULATORY_CARE_PROVIDER_SITE_OTHER): Payer: Commercial Managed Care - HMO

## 2016-09-18 ENCOUNTER — Other Ambulatory Visit: Payer: Self-pay | Admitting: Internal Medicine

## 2016-09-18 ENCOUNTER — Ambulatory Visit
Admission: RE | Admit: 2016-09-18 | Discharge: 2016-09-18 | Disposition: A | Discharge status: Home / Self Care | Payer: Commercial Managed Care - HMO | Source: Ambulatory Visit | Attending: Internal Medicine | Admitting: Internal Medicine

## 2016-09-18 DIAGNOSIS — Z1213 Encounter for screening for malignant neoplasm of small intestine: Secondary | ICD-10-CM | POA: Insufficient documentation

## 2016-09-18 DIAGNOSIS — Z1239 Encounter for other screening for malignant neoplasm of breast: Secondary | ICD-10-CM

## 2016-09-18 DIAGNOSIS — E119 Type 2 diabetes mellitus without complications: Secondary | ICD-10-CM | POA: Diagnosis not present

## 2016-09-18 DIAGNOSIS — Z1231 Encounter for screening mammogram for malignant neoplasm of breast: Secondary | ICD-10-CM | POA: Diagnosis not present

## 2016-09-18 DIAGNOSIS — E7800 Pure hypercholesterolemia, unspecified: Secondary | ICD-10-CM | POA: Diagnosis not present

## 2016-09-18 LAB — HEPATIC FUNCTION PANEL
ALBUMIN: 4.2 g/dL (ref 3.5–5.2)
ALT: 16 U/L (ref 0–35)
AST: 16 U/L (ref 0–37)
Alkaline Phosphatase: 70 U/L (ref 39–117)
BILIRUBIN TOTAL: 0.3 mg/dL (ref 0.2–1.2)
Bilirubin, Direct: 0 mg/dL (ref 0.0–0.3)
Total Protein: 6.9 g/dL (ref 6.0–8.3)

## 2016-09-18 LAB — BASIC METABOLIC PANEL
BUN: 8 mg/dL (ref 6–23)
CALCIUM: 9 mg/dL (ref 8.4–10.5)
CHLORIDE: 104 meq/L (ref 96–112)
CO2: 25 meq/L (ref 19–32)
CREATININE: 0.69 mg/dL (ref 0.40–1.20)
GFR: 89.16 mL/min (ref >60.00)
Glucose, Bld: 175 mg/dL — ABNORMAL HIGH (ref 70–99)
Potassium: 4.4 mEq/L (ref 3.5–5.1)
SODIUM: 139 meq/L (ref 135–145)

## 2016-09-18 LAB — LIPID PANEL
Cholesterol: 218 mg/dL — ABNORMAL HIGH (ref 0–200)
HDL: 46 mg/dL (ref >39.00)
LDL CALC: 137 mg/dL — AB (ref 0–99)
NONHDL: 172.13
Total CHOL/HDL Ratio: 5
Triglycerides: 178 mg/dL — ABNORMAL HIGH (ref 0.0–149.0)
VLDL: 35.6 mg/dL (ref 0.0–40.0)

## 2016-09-18 LAB — HEMOGLOBIN A1C: HEMOGLOBIN A1C: 8 % — AB (ref 4.6–6.5)

## 2016-09-18 LAB — HM MAMMOGRAPHY

## 2016-09-19 DIAGNOSIS — M1711 Unilateral primary osteoarthritis, right knee: Secondary | ICD-10-CM | POA: Diagnosis not present

## 2016-09-22 ENCOUNTER — Other Ambulatory Visit (INDEPENDENT_AMBULATORY_CARE_PROVIDER_SITE_OTHER): Payer: Self-pay | Admitting: Vascular Surgery

## 2016-09-22 DIAGNOSIS — I6523 Occlusion and stenosis of bilateral carotid arteries: Secondary | ICD-10-CM

## 2016-09-25 ENCOUNTER — Encounter: Payer: Self-pay | Admitting: Internal Medicine

## 2016-09-25 ENCOUNTER — Ambulatory Visit (INDEPENDENT_AMBULATORY_CARE_PROVIDER_SITE_OTHER): Payer: Commercial Managed Care - HMO | Admitting: Internal Medicine

## 2016-09-25 DIAGNOSIS — F329 Major depressive disorder, single episode, unspecified: Secondary | ICD-10-CM

## 2016-09-25 DIAGNOSIS — E039 Hypothyroidism, unspecified: Secondary | ICD-10-CM | POA: Diagnosis not present

## 2016-09-25 DIAGNOSIS — E7800 Pure hypercholesterolemia, unspecified: Secondary | ICD-10-CM

## 2016-09-25 DIAGNOSIS — Z23 Encounter for immunization: Secondary | ICD-10-CM | POA: Diagnosis not present

## 2016-09-25 DIAGNOSIS — F32A Depression, unspecified: Secondary | ICD-10-CM

## 2016-09-25 DIAGNOSIS — K219 Gastro-esophageal reflux disease without esophagitis: Secondary | ICD-10-CM

## 2016-09-25 DIAGNOSIS — I6529 Occlusion and stenosis of unspecified carotid artery: Secondary | ICD-10-CM

## 2016-09-25 DIAGNOSIS — B029 Zoster without complications: Secondary | ICD-10-CM | POA: Diagnosis not present

## 2016-09-25 DIAGNOSIS — E119 Type 2 diabetes mellitus without complications: Secondary | ICD-10-CM

## 2016-09-25 NOTE — Progress Notes (Signed)
Patient ID: Jody Wang, female   DOB: 09/21/1946, 70 y.o.   MRN: [id number]   Subjective:    Patient ID: Jody Wang, female    DOB: Jul 13, 1946, 70 y.o.   MRN: [id number]  HPI  Patient here for a scheduled follow up.  She saw ortho in 07/2016.  Had synvisc injection.  Knee is doing better.  She now plans to start exercising more.   She is getting a new dog.  Plans to start walking more.  She recently has started watching her diet more.  Sugars have been elevated.  Discussed recent labs.  a1c elevated - 8.0.  She has missed some of her pm medications.  Discussed adding medication.  She would like to continue with her diet and exercise and hold on adding medication.  She will start taking her medications regularly.  No chest pain.  No sob.  No acid reflux.  No abdominal pain or cramping.  Bowels stable.  States overall she is feeling better.     Past Medical History:  Diagnosis Date  . Depression   . Diabetes mellitus without complication ([place])   . GERD (gastroesophageal reflux disease)   . Heart murmur    slight - followed by PCP  . Hypercholesterolemia   . Hypothyroidism   . Osteoarthritis of knee   . Wears dentures    partial lower   Past Surgical History:  Procedure Laterality Date  . ABDOMINAL HYSTERECTOMY  1985   ovaries not removed  . BREAST CYST EXCISION Right   . BREAST CYST EXCISION Left   . CATARACT EXTRACTION W/PHACO Right 02/16/2016   Procedure: CATARACT EXTRACTION PHACO AND INTRAOCULAR LENS PLACEMENT ([place]) RIGHT ;  Surgeon: Leandrew Koyanagi, MD;  Location: [facility];  Service: Ophthalmology;  Laterality: Right;  DIABETIC - oral meds  . CATARACT EXTRACTION W/PHACO Left 03/15/2016   Procedure: CATARACT EXTRACTION PHACO AND INTRAOCULAR LENS PLACEMENT ([facility])left eye;  Surgeon: Leandrew Koyanagi, MD;  Location: [facility];  Service: Ophthalmology;  Laterality: Left;  DIABETIC - oral meds  . INNER EAR SURGERY     Family History  Problem  Relation Age of Onset  . Diabetes Father   . Coronary artery disease Father     s/p CABG  . Hypertension Father   . Hypercholesterolemia Father   . Hypertension Brother   . Hypertension Sister   . Glaucoma Sister   . Diabetes      niece  . Breast cancer Maternal Aunt   . Breast cancer Paternal Aunt   . Cervical cancer Paternal Aunt   . Breast cancer Cousin   . Breast cancer Other    Social History   Social History  . Marital status: Widowed    Spouse name: N/A  . Number of children: 3  . Years of education: N/A   Social History Main Topics  . Smoking status: Former Smoker    Packs/day: 1.00    Years: 23.00    Quit date: 11/06/1981  . Smokeless tobacco: Never Used  . Alcohol use 0.0 oz/week     Comment: rare  . Drug use: No  . Sexual activity: No   Other Topics Concern  . None   Social History Narrative  . None    Outpatient Encounter Prescriptions as of 09/25/2016  Medication Sig  . ALPRAZolam (XANAX) 0.25 MG tablet TAKE 1 TABLET AT BEDTIME AS NEEDED FOR SLEEP  . carbamide peroxide (DEBROX) 6.5 % otic solution Place 5 drops into both ears  2 (two) times daily.  . diclofenac (VOLTAREN) 50 MG EC tablet Take 1 tablet (50 mg total) by mouth 2 (two) times daily.  Marland Kitchen FLUoxetine (PROZAC) 20 MG capsule TAKE 1 CAPSULE EVERY DAY  . fluticasone (FLONASE) 50 MCG/ACT nasal spray Place 2 sprays into both nostrils daily.  Marland Kitchen glipiZIDE (GLUCOTROL XL) 2.5 MG 24 hr tablet Take 1 tablet (2.5 mg total) by mouth daily with breakfast.  . levocetirizine (XYZAL) 5 MG tablet Take 1 tablet (5 mg total) by mouth daily.  Marland Kitchen levothyroxine (SYNTHROID, LEVOTHROID) 88 MCG tablet TAKE 1 TABLET EVERY DAY  . metFORMIN (GLUCOPHAGE XR) 500 MG 24 hr tablet Take 2 tablets (1,000 mg total) by mouth 2 (two) times daily.  . pantoprazole (PROTONIX) 40 MG tablet TAKE 1 TABLET EVERY DAY  . pravastatin (PRAVACHOL) 40 MG tablet TAKE 1/2 TABLET EVERY DAY  . traZODone (DESYREL) 50 MG tablet TAKE 1 TABLET EVERY DAY    . [DISCONTINUED] gabapentin (NEURONTIN) 100 MG capsule take 1 capsule by mouth three times a day  . [DISCONTINUED] valACYclovir (VALTREX) 1000 MG tablet Take 1 tablet (1,000 mg total) by mouth 3 (three) times daily.   Facility-Administered Encounter Medications as of 09/25/2016  Medication  . glucose blood test strip STRP 1 each    Review of Systems  Constitutional: Negative for appetite change and unexpected weight change.  HENT: Negative for congestion and sinus pressure.   Respiratory: Negative for cough, chest tightness and shortness of breath.   Cardiovascular: Negative for chest pain, palpitations and leg swelling.  Gastrointestinal: Negative for abdominal pain, diarrhea, nausea and vomiting.  Genitourinary: Negative for difficulty urinating and dysuria.  Musculoskeletal: Negative for back pain.       Knee doing better s/p synvisc injection.    Skin: Negative for color change and rash.  Neurological: Negative for dizziness, light-headedness and headaches.  Psychiatric/Behavioral: Negative for agitation and dysphoric mood.       Objective:     Blood pressure rechecked by me:  120/72  Physical Exam  Constitutional: She appears well-developed and well-nourished. No distress.  HENT:  Nose: Nose normal.  Mouth/Throat: Oropharynx is clear and moist.  Neck: Neck supple. No thyromegaly present.  Cardiovascular: Normal rate and regular rhythm.   Pulmonary/Chest: Breath sounds normal. No respiratory distress. She has no wheezes.  Abdominal: Soft. Bowel sounds are normal. There is no tenderness.  Musculoskeletal: She exhibits no edema or tenderness.  Lymphadenopathy:    She has no cervical adenopathy.  Skin: No rash noted. No erythema.  Psychiatric: She has a normal mood and affect. Her behavior is normal.    BP 124/70   Pulse 78   Temp 98.2 F (36.8 C) (Oral)   Ht 5\' 2"  (1.575 m)   Wt 151 lb 3.2 oz (68.6 kg)   SpO2 98%   BMI 27.65 kg/m  Wt Readings from Last 3  Encounters:  09/25/16 151 lb 3.2 oz (68.6 kg)  07/17/16 151 lb (68.5 kg)  06/30/16 148 lb (67.1 kg)     Lab Results  Component Value Date   WBC 9.9 01/10/2016   HGB 12.4 01/10/2016   HCT 38.0 01/10/2016   PLT 434.0 (H) 01/10/2016   GLUCOSE 175 (H) 09/18/2016   CHOL 218 (H) 09/18/2016   TRIG 178.0 (H) 09/18/2016   HDL 46.00 09/18/2016   LDLDIRECT 188.8 06/30/2013   LDLCALC 137 (H) 09/18/2016   ALT 16 09/18/2016   AST 16 09/18/2016   NA 139 09/18/2016   K 4.4 09/18/2016  CL 104 09/18/2016   CREATININE 0.69 09/18/2016   BUN 8 09/18/2016   CO2 25 09/18/2016   TSH 2.36 01/10/2016   HGBA1C 8.0 (H) 09/18/2016   MICROALBUR 1.5 01/10/2016    Mm Screening Breast Tomo Bilateral  Result Date: 09/18/2016 CLINICAL DATA:  Screening. EXAM: 2D DIGITAL SCREENING BILATERAL MAMMOGRAM WITH CAD AND ADJUNCT TOMO COMPARISON:  Previous exam(s). ACR Breast Density Category c: The breast tissue is heterogeneously dense, which may obscure small masses. FINDINGS: There are no findings suspicious for malignancy. Images were processed with CAD. IMPRESSION: No mammographic evidence of malignancy. A result letter of this screening mammogram will be mailed directly to the patient. RECOMMENDATION: Screening mammogram in one year. (Code:SM-B-01Y) BI-RADS CATEGORY  1: Negative. Electronically Signed   By: Lovey Newcomer M.D.   On: 09/18/2016 16:13       Assessment & Plan:   Problem List Items Addressed This Visit    Carotid stenosis    Being followed by vascular surgery.  Continue daily aspirin.       Depression    On prozac.  Stable.        Diabetes mellitus ([place])    Blood sugars elevated.  She is not taking her medications regularly.  Missing doses.  She plans to start taking regularly.  Also is now more serious about her diet.  Sugars starting to improve.  Plans to exercise more.  Desires not to add medication.  Will follow.  Send in sugar readings over the next few weeks.        Relevant Orders    CBC with Differential/Platelet   Hemoglobin 123456   Basic metabolic panel   GERD (gastroesophageal reflux disease)    Controlled on protonix.  Follow.       Hypercholesterolemia    On pravastatin.  Low cholesterol diet and exercise.  Follow lipid panel and liver function tests.        Relevant Orders   Lipid panel   Hepatic function panel   Hypothyroidism    On thyroid replacement.  Follow tsh.        Shingles    Improved.  Follow.         Other Visit Diagnoses    Encounter for immunization       Relevant Orders   Flu vaccine HIGH DOSE PF (Completed)       Einar Pheasant, MD

## 2016-09-25 NOTE — Progress Notes (Signed)
Pre visit review using our clinic review tool, if applicable. No additional management support is needed unless otherwise documented below in the visit note. 

## 2016-09-26 ENCOUNTER — Ambulatory Visit (INDEPENDENT_AMBULATORY_CARE_PROVIDER_SITE_OTHER): Payer: Self-pay | Admitting: Vascular Surgery

## 2016-09-26 ENCOUNTER — Encounter (INDEPENDENT_AMBULATORY_CARE_PROVIDER_SITE_OTHER): Payer: Commercial Managed Care - HMO

## 2016-09-26 DIAGNOSIS — M1711 Unilateral primary osteoarthritis, right knee: Secondary | ICD-10-CM | POA: Diagnosis not present

## 2016-10-01 ENCOUNTER — Encounter: Payer: Self-pay | Admitting: Internal Medicine

## 2016-10-01 NOTE — Assessment & Plan Note (Signed)
On thyroid replacement.  Follow tsh.  

## 2016-10-01 NOTE — Assessment & Plan Note (Signed)
Blood sugars elevated.  She is not taking her medications regularly.  Missing doses.  She plans to start taking regularly.  Also is now more serious about her diet.  Sugars starting to improve.  Plans to exercise more.  Desires not to add medication.  Will follow.  Send in sugar readings over the next few weeks.

## 2016-10-01 NOTE — Assessment & Plan Note (Signed)
On prozac.  Stable.   

## 2016-10-01 NOTE — Assessment & Plan Note (Signed)
Controlled on protonix.  Follow.   

## 2016-10-01 NOTE — Assessment & Plan Note (Signed)
Being followed by vascular surgery.  Continue daily aspirin.

## 2016-10-01 NOTE — Assessment & Plan Note (Signed)
Improved.  Follow.  

## 2016-10-01 NOTE — Assessment & Plan Note (Signed)
On pravastatin.  Low cholesterol diet and exercise.  Follow lipid panel and liver function tests.   

## 2016-10-02 ENCOUNTER — Telehealth: Payer: Self-pay | Admitting: *Deleted

## 2016-10-02 NOTE — Telephone Encounter (Signed)
Noted.  Hold for now on rechecking.  She does not need to make a trip back into the office to have this checked.

## 2016-10-02 NOTE — Telephone Encounter (Signed)
Pt collected a specimen for UDS on Monday  09/25/16 at her office visit. Specimen was still in lab on Monday 10/02/16. Specimen was thrown out because it was too old to run (was not picked up).

## 2016-10-03 ENCOUNTER — Telehealth: Payer: Self-pay | Admitting: Internal Medicine

## 2016-10-03 DIAGNOSIS — M1711 Unilateral primary osteoarthritis, right knee: Secondary | ICD-10-CM | POA: Diagnosis not present

## 2016-10-03 MED ORDER — DICLOFENAC SODIUM 50 MG PO TBEC
50.0000 mg | DELAYED_RELEASE_TABLET | Freq: Two times a day (BID) | ORAL | 1 refills | Status: DC
Start: 2016-10-03 — End: 2016-11-30

## 2016-10-03 NOTE — Telephone Encounter (Signed)
Pt can repeat at next office visit.

## 2016-10-03 NOTE — Telephone Encounter (Signed)
Pt called requesting a refill on diclofenac (VOLTAREN) 50 MG EC tablet.   [facility] - [place], [place]   Call pt @ [phone]

## 2016-10-03 NOTE — Telephone Encounter (Signed)
Sent to pharmacy 

## 2016-10-27 ENCOUNTER — Encounter: Payer: Self-pay | Admitting: Internal Medicine

## 2016-11-16 ENCOUNTER — Ambulatory Visit (INDEPENDENT_AMBULATORY_CARE_PROVIDER_SITE_OTHER): Payer: Medicare HMO | Admitting: Internal Medicine

## 2016-11-16 ENCOUNTER — Encounter: Payer: Self-pay | Admitting: Internal Medicine

## 2016-11-16 DIAGNOSIS — F329 Major depressive disorder, single episode, unspecified: Secondary | ICD-10-CM

## 2016-11-16 DIAGNOSIS — F32A Depression, unspecified: Secondary | ICD-10-CM

## 2016-11-16 DIAGNOSIS — E7800 Pure hypercholesterolemia, unspecified: Secondary | ICD-10-CM | POA: Diagnosis not present

## 2016-11-16 DIAGNOSIS — M25569 Pain in unspecified knee: Secondary | ICD-10-CM | POA: Diagnosis not present

## 2016-11-16 DIAGNOSIS — E119 Type 2 diabetes mellitus without complications: Secondary | ICD-10-CM

## 2016-11-16 DIAGNOSIS — Z0000 Encounter for general adult medical examination without abnormal findings: Secondary | ICD-10-CM

## 2016-11-16 DIAGNOSIS — E039 Hypothyroidism, unspecified: Secondary | ICD-10-CM | POA: Diagnosis not present

## 2016-11-16 DIAGNOSIS — K219 Gastro-esophageal reflux disease without esophagitis: Secondary | ICD-10-CM

## 2016-11-16 DIAGNOSIS — I6529 Occlusion and stenosis of unspecified carotid artery: Secondary | ICD-10-CM

## 2016-11-16 NOTE — Progress Notes (Signed)
Patient ID: Jody Wang, female   DOB: 1946-05-08, 71 y.o.   MRN: [id number]   Subjective:    Patient ID: Jody Wang, female    DOB: December 09, 1945, 70 y.o.   MRN: [id number]  HPI  Patient here for a scheduled follow up.  She reports she is doing better.  Knee is doing better after her injections.  Able to be more active.  Has a new dog.  Also keeping her more active.  No chest pain.  No sob.  No acid reflux.  No abdominal pain or cramping.  Bowels stable.  Blood sugars averaging 140-180.  Stress is better.     Past Medical History:  Diagnosis Date  . Depression   . Diabetes mellitus without complication ([place])   . GERD (gastroesophageal reflux disease)   . Heart murmur    slight - followed by PCP  . Hypercholesterolemia   . Hypothyroidism   . Osteoarthritis of knee   . Wears dentures    partial lower   Past Surgical History:  Procedure Laterality Date  . ABDOMINAL HYSTERECTOMY  1985   ovaries not removed  . BREAST CYST EXCISION Right   . BREAST CYST EXCISION Left   . CATARACT EXTRACTION W/PHACO Right 02/16/2016   Procedure: CATARACT EXTRACTION PHACO AND INTRAOCULAR LENS PLACEMENT ([place]) RIGHT ;  Surgeon: Leandrew Koyanagi, MD;  Location: [facility];  Service: Ophthalmology;  Laterality: Right;  DIABETIC - oral meds  . CATARACT EXTRACTION W/PHACO Left 03/15/2016   Procedure: CATARACT EXTRACTION PHACO AND INTRAOCULAR LENS PLACEMENT ([facility])left eye;  Surgeon: Leandrew Koyanagi, MD;  Location: [facility];  Service: Ophthalmology;  Laterality: Left;  DIABETIC - oral meds  . INNER EAR SURGERY     Family History  Problem Relation Age of Onset  . Diabetes Father   . Coronary artery disease Father     s/p CABG  . Hypertension Father   . Hypercholesterolemia Father   . Hypertension Brother   . Hypertension Sister   . Glaucoma Sister   . Diabetes      niece  . Breast cancer Maternal Aunt   . Breast cancer Paternal Aunt   . Cervical cancer Paternal  Aunt   . Breast cancer Cousin   . Breast cancer Other    Social History   Social History  . Marital status: Widowed    Spouse name: N/A  . Number of children: 3  . Years of education: N/A   Social History Main Topics  . Smoking status: Former Smoker    Packs/day: 1.00    Years: 23.00    Quit date: 11/06/1981  . Smokeless tobacco: Never Used  . Alcohol use 0.0 oz/week     Comment: rare  . Drug use: No  . Sexual activity: No   Other Topics Concern  . None   Social History Narrative  . None    Outpatient Encounter Prescriptions as of 11/16/2016  Medication Sig  . diclofenac (VOLTAREN) 50 MG EC tablet Take 1 tablet (50 mg total) by mouth 2 (two) times daily.  Marland Kitchen FLUoxetine (PROZAC) 20 MG capsule TAKE 1 CAPSULE EVERY DAY  . fluticasone (FLONASE) 50 MCG/ACT nasal spray Place 2 sprays into both nostrils daily.  Marland Kitchen glipiZIDE (GLUCOTROL XL) 2.5 MG 24 hr tablet Take 1 tablet (2.5 mg total) by mouth daily with breakfast.  . levocetirizine (XYZAL) 5 MG tablet Take 1 tablet (5 mg total) by mouth daily.  Marland Kitchen levothyroxine (SYNTHROID, LEVOTHROID) 88 MCG tablet TAKE 1  TABLET EVERY DAY  . metFORMIN (GLUCOPHAGE XR) 500 MG 24 hr tablet Take 2 tablets (1,000 mg total) by mouth 2 (two) times daily.  . pantoprazole (PROTONIX) 40 MG tablet TAKE 1 TABLET EVERY DAY  . pravastatin (PRAVACHOL) 40 MG tablet TAKE 1/2 TABLET EVERY DAY  . traZODone (DESYREL) 50 MG tablet TAKE 1 TABLET EVERY DAY  . ALPRAZolam (XANAX) 0.25 MG tablet TAKE 1 TABLET AT BEDTIME AS NEEDED FOR SLEEP (Patient not taking: Reported on 11/16/2016)  . carbamide peroxide (DEBROX) 6.5 % otic solution Place 5 drops into both ears 2 (two) times daily. (Patient not taking: Reported on 11/16/2016)   Facility-Administered Encounter Medications as of 11/16/2016  Medication  . glucose blood test strip STRP 1 each    Review of Systems  Constitutional: Negative for appetite change and unexpected weight change.  HENT: Negative for congestion and  sinus pressure.   Respiratory: Negative for cough, chest tightness and shortness of breath.   Cardiovascular: Negative for chest pain, palpitations and leg swelling.  Gastrointestinal: Negative for abdominal pain, diarrhea, nausea and vomiting.  Genitourinary: Negative for difficulty urinating and dysuria.  Musculoskeletal: Negative for back pain and joint swelling.       Knee is better.   Skin: Negative for color change and rash.  Neurological: Negative for dizziness, light-headedness and headaches.  Psychiatric/Behavioral: Negative for agitation and dysphoric mood.       Objective:    Physical Exam  Constitutional: She appears well-developed and well-nourished. No distress.  HENT:  Nose: Nose normal.  Mouth/Throat: Oropharynx is clear and moist.  Neck: Neck supple. No thyromegaly present.  Cardiovascular: Normal rate and regular rhythm.   Pulmonary/Chest: Breath sounds normal. No respiratory distress. She has no wheezes.  Abdominal: Soft. Bowel sounds are normal. There is no tenderness.  Musculoskeletal: She exhibits no edema or tenderness.  Lymphadenopathy:    She has no cervical adenopathy.  Skin: No rash noted. No erythema.  Psychiatric: She has a normal mood and affect. Her behavior is normal.    BP 126/64   Pulse 100   Temp 98.9 F (37.2 C) (Oral)   Resp 12   Ht _0  (1.575 m)   Wt 150 lb (68 kg)   SpO2 95%   BMI 27.44 kg/m  Wt Readings from Last 3 Encounters:  11/16/16 150 lb (68 kg)  09/25/16 151 lb 3.2 oz (68.6 kg)  07/17/16 151 lb (68.5 kg)     Lab Results  Component Value Date   WBC 9.9 01/10/2016   HGB 12.4 01/10/2016   HCT 38.0 01/10/2016   PLT 434.0 (H) 01/10/2016   GLUCOSE 175 (H) 09/18/2016   CHOL 218 (H) 09/18/2016   TRIG 178.0 (H) 09/18/2016   HDL 46.00 09/18/2016   LDLDIRECT 188.8 06/30/2013   LDLCALC 137 (H) 09/18/2016   ALT 16 09/18/2016   AST 16 09/18/2016   NA 139 09/18/2016   K 4.4 09/18/2016   CL 104 09/18/2016   CREATININE  0.69 09/18/2016   BUN 8 09/18/2016   CO2 25 09/18/2016   TSH 2.36 01/10/2016   HGBA1C 8.0 (H) 09/18/2016   MICROALBUR 1.5 01/10/2016    Mm Screening Breast Tomo Bilateral  Result Date: 09/18/2016 CLINICAL DATA:  Screening. EXAM: 2D DIGITAL SCREENING BILATERAL MAMMOGRAM WITH CAD AND ADJUNCT TOMO COMPARISON:  Previous exam(s). ACR Breast Density Category c: The breast tissue is heterogeneously dense, which may obscure small masses. FINDINGS: There are no findings suspicious for malignancy. Images were processed with CAD. IMPRESSION:  No mammographic evidence of malignancy. A result letter of this screening mammogram will be mailed directly to the patient. RECOMMENDATION: Screening mammogram in one year. (Code:SM-B-01Y) BI-RADS CATEGORY  1: Negative. Electronically Signed   By: Lovey Newcomer M.D.   On: 09/18/2016 16:13       Assessment & Plan:   Problem List Items Addressed This Visit    Carotid stenosis    Being followed by vascular surgery.        Depression    Doing well on prozac.  Follow.        Diabetes mellitus ([place])    Low carb diet and exercise.  Sugars as outlined.  Overall appear to be improved.  Follow met b and a1c.        GERD (gastroesophageal reflux disease)    Controlled on protonix.        Health care maintenance    Mammogram 09/18/16 - birads I.        Hypercholesterolemia    Low cholesterol diet and exercise.  On pravastatin.  Follow lipid panel and liver function tests.        Hypothyroidism    On thyroid replacement.  Follow tsh.        Relevant Orders   TSH   Knee pain    Knee better s/p injections.  Follow.            Einar Pheasant, MD

## 2016-11-19 ENCOUNTER — Encounter: Payer: Self-pay | Admitting: Internal Medicine

## 2016-11-19 NOTE — Assessment & Plan Note (Signed)
Mammogram 09/18/16 - birads I.

## 2016-11-19 NOTE — Assessment & Plan Note (Signed)
Low carb diet and exercise.  Sugars as outlined.  Overall appear to be improved.  Follow met b and a1c.

## 2016-11-19 NOTE — Assessment & Plan Note (Signed)
Knee better s/p injections.  Follow.

## 2016-11-19 NOTE — Assessment & Plan Note (Signed)
Low cholesterol diet and exercise.  On pravastatin.  Follow lipid panel and liver function tests.   

## 2016-11-19 NOTE — Assessment & Plan Note (Signed)
Doing well on prozac.  Follow  

## 2016-11-19 NOTE — Assessment & Plan Note (Signed)
Being followed by vascular surgery.   

## 2016-11-19 NOTE — Assessment & Plan Note (Signed)
Controlled on protonix.   

## 2016-11-19 NOTE — Assessment & Plan Note (Signed)
On thyroid replacement.  Follow tsh.  

## 2016-11-25 ENCOUNTER — Other Ambulatory Visit: Payer: Self-pay | Admitting: Internal Medicine

## 2016-11-29 ENCOUNTER — Ambulatory Visit: Payer: Commercial Managed Care - HMO

## 2016-11-30 ENCOUNTER — Ambulatory Visit (INDEPENDENT_AMBULATORY_CARE_PROVIDER_SITE_OTHER): Payer: Medicare HMO

## 2016-11-30 ENCOUNTER — Other Ambulatory Visit: Payer: Self-pay | Admitting: Internal Medicine

## 2016-11-30 VITALS — BP 120/64 | HR 79 | Temp 98.0°F | Resp 14 | Ht 62.0 in | Wt 149.4 lb

## 2016-11-30 DIAGNOSIS — Z0000 Encounter for general adult medical examination without abnormal findings: Secondary | ICD-10-CM | POA: Diagnosis not present

## 2016-11-30 NOTE — Progress Notes (Signed)
Subjective:   Jody Wang is a 71 y.o. female who presents for Medicare Annual (Subsequent) preventive examination.  Review of Systems:  No ROS.  Medicare Wellness Visit.  Cardiac Risk Factors include: advanced age (>60men, >23 women);diabetes mellitus     Objective:     Vitals: BP 120/64 (BP Location: Left Arm, Patient Position: Sitting, Cuff Size: Normal)   Pulse 79   Temp 98 F (36.7 C) (Oral)   Resp 14   Ht 5\' 2"  (1.575 m)   Wt 149 lb 6.4 oz (67.8 kg)   SpO2 97%   BMI 27.33 kg/m   Body mass index is 27.33 kg/m.   Tobacco History  Smoking Status  . Former Smoker  . Packs/day: 1.00  . Years: 23.00  . Quit date: 11/06/1981  Smokeless Tobacco  . Never Used     Counseling given: Not Answered   Past Medical History:  Diagnosis Date  . Depression   . Diabetes mellitus without complication ([place])   . GERD (gastroesophageal reflux disease)   . Heart murmur    slight - followed by PCP  . Hypercholesterolemia   . Hypothyroidism   . Osteoarthritis of knee   . Wears dentures    partial lower   Past Surgical History:  Procedure Laterality Date  . ABDOMINAL HYSTERECTOMY  1985   ovaries not removed  . BREAST CYST EXCISION Right   . BREAST CYST EXCISION Left   . CATARACT EXTRACTION W/PHACO Right 02/16/2016   Procedure: CATARACT EXTRACTION PHACO AND INTRAOCULAR LENS PLACEMENT ([place]) RIGHT ;  Surgeon: Leandrew Koyanagi, MD;  Location: [facility];  Service: Ophthalmology;  Laterality: Right;  DIABETIC - oral meds  . CATARACT EXTRACTION W/PHACO Left 03/15/2016   Procedure: CATARACT EXTRACTION PHACO AND INTRAOCULAR LENS PLACEMENT ([facility])left eye;  Surgeon: Leandrew Koyanagi, MD;  Location: [facility];  Service: Ophthalmology;  Laterality: Left;  DIABETIC - oral meds  . INNER EAR SURGERY     Family History  Problem Relation Age of Onset  . Diabetes Father   . Coronary artery disease Father     s/p CABG  . Hypertension Father   .  Hypercholesterolemia Father   . Hypertension Brother   . Hypertension Sister   . Glaucoma Sister   . Diabetes      niece  . Breast cancer Maternal Aunt   . Breast cancer Paternal Aunt   . Cervical cancer Paternal Aunt   . Breast cancer Cousin   . Breast cancer Other    History  Sexual Activity  . Sexual activity: No    Outpatient Encounter Prescriptions as of 11/30/2016  Medication Sig  . ALPRAZolam (XANAX) 0.25 MG tablet TAKE 1 TABLET AT BEDTIME AS NEEDED FOR SLEEP  . carbamide peroxide (DEBROX) 6.5 % otic solution Place 5 drops into both ears 2 (two) times daily.  . diclofenac (VOLTAREN) 50 MG EC tablet Take 1 tablet (50 mg total) by mouth 2 (two) times daily.  Marland Kitchen FLUoxetine (PROZAC) 20 MG capsule TAKE 1 CAPSULE EVERY DAY  . fluticasone (FLONASE) 50 MCG/ACT nasal spray Place 2 sprays into both nostrils daily.  Marland Kitchen glipiZIDE (GLUCOTROL XL) 2.5 MG 24 hr tablet TAKE 1 TABLET  DAILY WITH BREAKFAST.  Marland Kitchen levocetirizine (XYZAL) 5 MG tablet Take 1 tablet (5 mg total) by mouth daily.  Marland Kitchen levothyroxine (SYNTHROID, LEVOTHROID) 88 MCG tablet TAKE 1 TABLET EVERY DAY  . metFORMIN (GLUCOPHAGE XR) 500 MG 24 hr tablet Take 2 tablets (1,000 mg total) by mouth  2 (two) times daily.  . pantoprazole (PROTONIX) 40 MG tablet TAKE 1 TABLET EVERY DAY  . pravastatin (PRAVACHOL) 40 MG tablet TAKE 1/2 TABLET EVERY DAY  . traZODone (DESYREL) 50 MG tablet TAKE 1 TABLET EVERY DAY   Facility-Administered Encounter Medications as of 11/30/2016  Medication  . glucose blood test strip STRP 1 each    Activities of Daily Living In your present state of health, do you have any difficulty performing the following activities: 11/30/2016 02/16/2016  Hearing? N N  Vision? N N  Difficulty concentrating or making decisions? N N  Walking or climbing stairs? Y N  Dressing or bathing? N N  Doing errands, shopping? N -  Preparing Food and eating ? N -  Using the Toilet? N -  In the past six months, have you accidently leaked  urine? N -  Do you have problems with loss of bowel control? Y -  Managing your Medications? N -  Managing your Finances? N -  Housekeeping or managing your Housekeeping? N -  Some recent data might be hidden    Patient Care Team: Einar Pheasant, MD as PCP - General (Internal Medicine)    Assessment:    This is a routine wellness examination for [name]. The goal of the wellness visit is to assist the patient how to close the gaps in care and create a preventative care plan for the patient.   Osteoporosis risk reviewed.  Bone density discussed; educational material provided.  No appointment scheduled at this time per patient preference.  Medications reviewed; taking without issues or barriers.  Safety issues reviewed; lives alone.  Smoke detectors in the home. No firearms in the home. Wears seatbelts when driving or riding with others. No violence in the home.  No identified risk were noted; The patient was oriented x 3; appropriate in dress and manner and no objective failures at ADL's or IADL's.   BMI; discussed the importance of a healthy diet, water intake and exercise. Educational material provided.  Patient Concerns: None at this time. Follow up with PCP as needed.  Exercise Activities and Dietary recommendations Current Exercise Habits: Home exercise routine (Walks her dog Surry), Type of exercise: walking, Time (Minutes): 10, Frequency (Times/Week): 2, Weekly Exercise (Minutes/Week): 20, Intensity: Mild  Goals    . Healthy Lifestyle          Stay hydrated.  Substitute 1 bottle of coke for 1 of water Low carb foods.  Lean meats, fruits and vegetables/V8 juice Water aerobics and walking [place] for exercise      Fall Risk Fall Risk  11/30/2016 09/25/2016 07/17/2016 11/30/2015 10/08/2015  Falls in the past year? No No No Yes Yes  Number falls in past yr: - - - 2 or more 1  Injury with Fall? - - - No No  Follow up - - - Education provided;Falls prevention discussed  -   Depression Screen PHQ 2/9 Scores 11/30/2016 09/25/2016 07/17/2016 11/30/2015  PHQ - 2 Score 1 0 0 0  PHQ- 9 Score - - - -     Cognitive Function MMSE - Mini Mental State Exam 11/30/2015  Orientation to time 5  Orientation to Place 5  Registration 3  Attention/ Calculation 5  Recall 3  Language- name 2 objects 2  Language- repeat 1  Language- follow 3 step command 3  Language- read & follow direction 1  Write a sentence 1  Copy design 1  Total score 30     6CIT Screen 11/30/2016  What Year? 0 points  What month? 0 points  What time? 0 points  Count back from 20 0 points  Months in reverse 0 points  Repeat phrase 0 points  Total Score 0    Immunization History  Administered Date(s) Administered  . Influenza Split 08/24/2013, 08/20/2014  . Influenza, High Dose Seasonal PF 09/25/2016  . Influenza,inj,Quad PF,36+ Mos 07/09/2015  . Pneumococcal Conjugate-13 10/08/2015  . Pneumococcal Polysaccharide-23 07/01/2013  . Zoster 08/24/2013   Screening Tests Health Maintenance  Topic Date Due  . TETANUS/TDAP  10/08/1964  . DEXA SCAN  10/08/2010  . FOOT EXAM  06/16/2014  . URINE MICROALBUMIN  01/09/2017  . OPHTHALMOLOGY EXAM  01/24/2017  . HEMOGLOBIN A1C  03/18/2017  . MAMMOGRAM  09/18/2018  . Fecal DNA (Cologuard)  11/16/2018  . INFLUENZA VACCINE  Completed  . ZOSTAVAX  Completed  . Hepatitis C Screening  Completed  . PNA vac Low Risk Adult  Completed      Plan:    End of life planning; Advance aging; Advanced directives discussed. No HCPOA/Living Will.  Additional information declined at this time.  Medicare Attestation I have personally reviewed: The patient's medical and social history Their use of alcohol, tobacco or illicit drugs Their current medications and supplements The patient's functional ability including ADLs,fall risks, home safety risks, cognitive, and hearing and visual impairment Diet and physical activities Evidence for depression   The  patient's weight, height, BMI, and visual acuity have been recorded in the chart.  I have made referrals and provided education to the patient based on review of the above and I have provided the patient with a written personalized care plan for preventive services.    During the course of the visit the patient was educated and counseled about the following appropriate screening and preventive services:   Vaccines to include Pneumoccal, Influenza, Hepatitis B, Td, Zostavax, HCV  Electrocardiogram  Cardiovascular Disease  Colorectal cancer screening  Bone density screening  Diabetes screening  Glaucoma screening  Mammography/PAP  Nutrition counseling   Patient Instructions (the written plan) was given to the patient.   Varney Biles, LPN  [id number]   Reviewed above information.  Agree with plan.  Dr Nicki Reaper

## 2016-11-30 NOTE — Patient Instructions (Addendum)
  Jody Wang , Thank you for taking time to come for your Medicare Wellness Visit. I appreciate your ongoing commitment to your health goals. Please review the following plan we discussed and let me know if I can assist you in the future.   Follow up with Dr. Nicki Reaper as needed.  These are the goals we discussed: Goals    . Healthy Lifestyle          Stay hydrated.  Substitute 1 bottle of coke for 1 of water Low carb foods.  Lean meats, fruits and vegetables/V8 juice Water aerobics and walking [place] for exercise       This is a list of the screening recommended for you and due dates:  Health Maintenance  Topic Date Due  . Tetanus Vaccine  10/08/1964  . DEXA scan (bone density measurement)  10/08/2010  . Complete foot exam   06/16/2014  . Urine Protein Check  01/09/2017  . Eye exam for diabetics  01/24/2017  . Hemoglobin A1C  03/18/2017  . Mammogram  09/18/2018  . Cologuard (Stool DNA test)  11/16/2018  . Flu Shot  Completed  . Shingles Vaccine  Completed  .  Hepatitis C: One time screening is recommended by Center for Disease Control  (CDC) for  adults born from [birth year] through 1966.   Completed  . Pneumonia vaccines  Completed

## 2016-12-01 NOTE — Telephone Encounter (Signed)
Left message for patient to return call back for clarification.

## 2016-12-01 NOTE — Telephone Encounter (Signed)
Need to clarify if she wants sent to [facility].  Thanks

## 2016-12-06 NOTE — Telephone Encounter (Signed)
Patient would like this to go to [facility] and would also like a refill of metformin

## 2016-12-07 MED ORDER — METFORMIN HCL ER 500 MG PO TB24: 1000.0000 mg | ORAL_TABLET | Freq: Two times a day (BID) | ORAL | 1 refills | Status: DC

## 2016-12-07 NOTE — Telephone Encounter (Signed)
ok'd rx for diclofenac #180 with no refills and metformin 66months with 1 refill.

## 2016-12-08 ENCOUNTER — Encounter (INDEPENDENT_AMBULATORY_CARE_PROVIDER_SITE_OTHER): Payer: Medicare HMO

## 2016-12-08 ENCOUNTER — Ambulatory Visit (INDEPENDENT_AMBULATORY_CARE_PROVIDER_SITE_OTHER): Payer: Self-pay | Admitting: Vascular Surgery

## 2016-12-27 ENCOUNTER — Other Ambulatory Visit (INDEPENDENT_AMBULATORY_CARE_PROVIDER_SITE_OTHER): Payer: Medicare HMO

## 2016-12-27 DIAGNOSIS — E7800 Pure hypercholesterolemia, unspecified: Secondary | ICD-10-CM | POA: Diagnosis not present

## 2016-12-27 DIAGNOSIS — E119 Type 2 diabetes mellitus without complications: Secondary | ICD-10-CM

## 2016-12-27 DIAGNOSIS — E039 Hypothyroidism, unspecified: Secondary | ICD-10-CM

## 2016-12-27 LAB — CBC WITH DIFFERENTIAL/PLATELET
BASOS ABS: 0 10*3/uL (ref 0.0–0.1)
Basophils Relative: 0.4 % (ref 0.0–3.0)
EOS ABS: 0.4 10*3/uL (ref 0.0–0.7)
Eosinophils Relative: 4.8 % (ref 0.0–5.0)
HEMATOCRIT: 37.2 % (ref 36.0–46.0)
Hemoglobin: 12.1 g/dL (ref 12.0–15.0)
LYMPHS PCT: 24.4 % (ref 12.0–46.0)
Lymphs Abs: 2 10*3/uL (ref 0.7–4.0)
MCHC: 32.4 g/dL (ref 30.0–36.0)
MCV: 82.1 fl (ref 78.0–100.0)
Monocytes Absolute: 0.6 10*3/uL (ref 0.1–1.0)
Monocytes Relative: 7 % (ref 3.0–12.0)
Neutro Abs: 5.1 10*3/uL (ref 1.4–7.7)
Neutrophils Relative %: 63.4 % (ref 43.0–77.0)
Platelets: 450 10*3/uL — ABNORMAL HIGH (ref 150.0–400.0)
RBC: 4.53 Mil/uL (ref 3.87–5.11)
RDW: 15 % (ref 11.5–15.5)
WBC: 8.1 10*3/uL (ref 4.0–10.5)

## 2016-12-27 LAB — LIPID PANEL
CHOL/HDL RATIO: 5
Cholesterol: 201 mg/dL — ABNORMAL HIGH (ref 0–200)
HDL: 43.1 mg/dL (ref >39.00)
LDL CALC: 121 mg/dL — AB (ref 0–99)
NONHDL: 158.29
Triglycerides: 184 mg/dL — ABNORMAL HIGH (ref 0.0–149.0)
VLDL: 36.8 mg/dL (ref 0.0–40.0)

## 2016-12-27 LAB — BASIC METABOLIC PANEL
BUN: 10 mg/dL (ref 6–23)
CHLORIDE: 103 meq/L (ref 96–112)
CO2: 27 mEq/L (ref 19–32)
CREATININE: 0.75 mg/dL (ref 0.40–1.20)
Calcium: 9.5 mg/dL (ref 8.4–10.5)
GFR: 80.91 mL/min (ref >60.00)
GLUCOSE: 189 mg/dL — AB (ref 70–99)
POTASSIUM: 4.9 meq/L (ref 3.5–5.1)
Sodium: 138 mEq/L (ref 135–145)

## 2016-12-27 LAB — HEPATIC FUNCTION PANEL
ALBUMIN: 4.5 g/dL (ref 3.5–5.2)
ALK PHOS: 79 U/L (ref 39–117)
ALT: 11 U/L (ref 0–35)
AST: 10 U/L (ref 0–37)
BILIRUBIN DIRECT: 0.1 mg/dL (ref 0.0–0.3)
TOTAL PROTEIN: 7 g/dL (ref 6.0–8.3)
Total Bilirubin: 0.3 mg/dL (ref 0.2–1.2)

## 2016-12-27 LAB — TSH: TSH: 1.96 u[IU]/mL (ref 0.35–4.50)

## 2016-12-27 LAB — HEMOGLOBIN A1C: Hgb A1c MFr Bld: 7.5 % — ABNORMAL HIGH (ref 4.6–6.5)

## 2016-12-28 ENCOUNTER — Telehealth: Payer: Self-pay | Admitting: Internal Medicine

## 2016-12-28 NOTE — Telephone Encounter (Signed)
See results note. 

## 2016-12-28 NOTE — Telephone Encounter (Signed)
Pt called returning your call in regards to labs. Pt has several appts this afternoon.   Call pt @ [phone]

## 2016-12-29 ENCOUNTER — Encounter: Payer: Commercial Managed Care - HMO | Admitting: Internal Medicine

## 2016-12-29 NOTE — Telephone Encounter (Signed)
Please call pt at [phone] for lab results

## 2016-12-29 NOTE — Telephone Encounter (Signed)
Patient informed voiced understanding. Lab appointment made and results letter mailed. 

## 2017-01-05 ENCOUNTER — Telehealth: Payer: Self-pay | Admitting: *Deleted

## 2017-01-05 NOTE — Telephone Encounter (Signed)
Called pt back went over all question. Lab app was put in letter for sep but should have been April

## 2017-01-05 NOTE — Telephone Encounter (Signed)
Pt requested a call to discuss her labs that were mailed out to her  Pt contact [phone]

## 2017-01-18 ENCOUNTER — Telehealth: Payer: Self-pay | Admitting: Radiology

## 2017-01-18 ENCOUNTER — Other Ambulatory Visit: Payer: Self-pay | Admitting: Internal Medicine

## 2017-01-18 DIAGNOSIS — D75839 Thrombocytosis, unspecified: Secondary | ICD-10-CM

## 2017-01-18 DIAGNOSIS — D473 Essential (hemorrhagic) thrombocythemia: Secondary | ICD-10-CM

## 2017-01-18 NOTE — Telephone Encounter (Signed)
Pt coming in for labs tomorrow, please place future orders. Thank you.  

## 2017-01-18 NOTE — Progress Notes (Signed)
Order placed for f/u platelet count.  

## 2017-01-18 NOTE — Telephone Encounter (Signed)
Order placed for f/u platelet count.  

## 2017-01-19 ENCOUNTER — Other Ambulatory Visit (INDEPENDENT_AMBULATORY_CARE_PROVIDER_SITE_OTHER): Payer: Medicare HMO

## 2017-01-19 DIAGNOSIS — D473 Essential (hemorrhagic) thrombocythemia: Secondary | ICD-10-CM

## 2017-01-19 DIAGNOSIS — D75839 Thrombocytosis, unspecified: Secondary | ICD-10-CM

## 2017-01-19 LAB — PLATELET COUNT: Platelets: 388 10*3/uL (ref 140–400)

## 2017-01-23 ENCOUNTER — Ambulatory Visit (INDEPENDENT_AMBULATORY_CARE_PROVIDER_SITE_OTHER): Payer: Medicare HMO | Admitting: Internal Medicine

## 2017-01-23 VITALS — BP 100/54 | HR 93 | Temp 98.6°F | Resp 18 | Ht 62.0 in | Wt 148.4 lb

## 2017-01-23 DIAGNOSIS — R079 Chest pain, unspecified: Secondary | ICD-10-CM

## 2017-01-23 DIAGNOSIS — Z9109 Other allergy status, other than to drugs and biological substances: Secondary | ICD-10-CM

## 2017-01-23 DIAGNOSIS — F329 Major depressive disorder, single episode, unspecified: Secondary | ICD-10-CM

## 2017-01-23 DIAGNOSIS — E119 Type 2 diabetes mellitus without complications: Secondary | ICD-10-CM | POA: Diagnosis not present

## 2017-01-23 DIAGNOSIS — R0602 Shortness of breath: Secondary | ICD-10-CM

## 2017-01-23 DIAGNOSIS — K219 Gastro-esophageal reflux disease without esophagitis: Secondary | ICD-10-CM | POA: Diagnosis not present

## 2017-01-23 DIAGNOSIS — E7800 Pure hypercholesterolemia, unspecified: Secondary | ICD-10-CM | POA: Diagnosis not present

## 2017-01-23 DIAGNOSIS — E039 Hypothyroidism, unspecified: Secondary | ICD-10-CM | POA: Diagnosis not present

## 2017-01-23 DIAGNOSIS — F32A Depression, unspecified: Secondary | ICD-10-CM

## 2017-01-23 NOTE — Patient Instructions (Signed)
Increase protonix to twice a day.

## 2017-01-23 NOTE — Progress Notes (Signed)
Patient ID: Jody Wang, female   DOB: [birth year], 71 y.o.   MRN: [id number]   Subjective:    Patient ID: Jody Wang, female    DOB: January 28, 1946, 71 y.o.   MRN: [id number]  HPI  Patient here for a scheduled physical exam.  Given that she had several concerns and did not feel well, it was decided to postpone her physical.  She reports that she does not feel well.  Has noticed recent headache.  Bilateral headache.  Left ear discomfort/full.  Left throat discomfort.  Not like a sore throat.  Hurts to swallow.  Some throat pain when she takes a deep breath.  Some chest discomfort.  This started one week ago.  No swallowing problems.  No nausea or vomiting.  Bowels moving.  No fever.  No rash.  No neck stiffness.  Sugars doing better.  Trying to watch her diet.  Discussed low carb diet.  No urinary change.    Past Medical History:  Diagnosis Date  . Depression   . Diabetes mellitus without complication ([place])   . GERD (gastroesophageal reflux disease)   . Heart murmur    slight - followed by PCP  . Hypercholesterolemia   . Hypothyroidism   . Osteoarthritis of knee   . Wears dentures    partial lower   Past Surgical History:  Procedure Laterality Date  . ABDOMINAL HYSTERECTOMY  1985   ovaries not removed  . BREAST CYST EXCISION Right   . BREAST CYST EXCISION Left   . CATARACT EXTRACTION W/PHACO Right 02/16/2016   Procedure: CATARACT EXTRACTION PHACO AND INTRAOCULAR LENS PLACEMENT ([place]) RIGHT ;  Surgeon: Leandrew Koyanagi, MD;  Location: [facility];  Service: Ophthalmology;  Laterality: Right;  DIABETIC - oral meds  . CATARACT EXTRACTION W/PHACO Left 03/15/2016   Procedure: CATARACT EXTRACTION PHACO AND INTRAOCULAR LENS PLACEMENT ([facility])left eye;  Surgeon: Leandrew Koyanagi, MD;  Location: [facility];  Service: Ophthalmology;  Laterality: Left;  DIABETIC - oral meds  . INNER EAR SURGERY     Family History  Problem Relation Age of Onset  . Diabetes Father     . Coronary artery disease Father     s/p CABG  . Hypertension Father   . Hypercholesterolemia Father   . Hypertension Brother   . Hypertension Sister   . Glaucoma Sister   . Diabetes      niece  . Breast cancer Maternal Aunt   . Breast cancer Paternal Aunt   . Cervical cancer Paternal Aunt   . Breast cancer Cousin   . Breast cancer Other    Social History   Social History  . Marital status: Widowed    Spouse name: N/A  . Number of children: 3  . Years of education: N/A   Social History Main Topics  . Smoking status: Former Smoker    Packs/day: 1.00    Years: 23.00    Quit date: 11/06/1981  . Smokeless tobacco: Never Used  . Alcohol use 0.0 oz/week     Comment: rare  . Drug use: No  . Sexual activity: No   Other Topics Concern  . None   Social History Narrative  . None    Outpatient Encounter Prescriptions as of 01/23/2017  Medication Sig  . carbamide peroxide (DEBROX) 6.5 % otic solution Place 5 drops into both ears 2 (two) times daily.  . diclofenac (VOLTAREN) 50 MG EC tablet Take 1 tablet (50 mg total) by mouth 2 (two) times daily as  needed.  Marland Kitchen levocetirizine (XYZAL) 5 MG tablet Take 1 tablet (5 mg total) by mouth daily.  Marland Kitchen levothyroxine (SYNTHROID, LEVOTHROID) 88 MCG tablet TAKE 1 TABLET EVERY DAY  . metFORMIN (GLUCOPHAGE XR) 500 MG 24 hr tablet Take 2 tablets (1,000 mg total) by mouth 2 (two) times daily.  . pravastatin (PRAVACHOL) 40 MG tablet TAKE 1/2 TABLET EVERY DAY  . [DISCONTINUED] ALPRAZolam (XANAX) 0.25 MG tablet TAKE 1 TABLET AT BEDTIME AS NEEDED FOR SLEEP  . [DISCONTINUED] FLUoxetine (PROZAC) 20 MG capsule TAKE 1 CAPSULE EVERY DAY  . [DISCONTINUED] fluticasone (FLONASE) 50 MCG/ACT nasal spray Place 2 sprays into both nostrils daily.  . [DISCONTINUED] glipiZIDE (GLUCOTROL XL) 2.5 MG 24 hr tablet TAKE 1 TABLET  DAILY WITH BREAKFAST.  . [DISCONTINUED] pantoprazole (PROTONIX) 40 MG tablet TAKE 1 TABLET EVERY DAY  . [DISCONTINUED] traZODone (DESYREL) 50  MG tablet TAKE 1 TABLET EVERY DAY   Facility-Administered Encounter Medications as of 01/23/2017  Medication  . glucose blood test strip STRP 1 each    Review of Systems  Constitutional: Negative for appetite change and unexpected weight change.  HENT: Negative for congestion and sinus pressure.        Ear fullness as outlined.   Respiratory: Negative for cough and shortness of breath.   Cardiovascular: Positive for chest pain. Negative for palpitations and leg swelling.  Gastrointestinal: Negative for abdominal pain, diarrhea, nausea and vomiting.  Genitourinary: Negative for difficulty urinating and dysuria.  Musculoskeletal: Negative for back pain and joint swelling.  Skin: Negative for color change and rash.  Neurological: Positive for headaches. Negative for dizziness and light-headedness.  Psychiatric/Behavioral: Negative for agitation and dysphoric mood.       Objective:    Physical Exam  Constitutional: She appears well-developed and well-nourished. No distress.  HENT:  Nose: Nose normal.  Mouth/Throat: Oropharynx is clear and moist.  TMs without erythema.  Some tenderness to palpation - anterior throat.    Neck: Neck supple. No thyromegaly present.  Cardiovascular: Normal rate and regular rhythm.   Pulmonary/Chest: Breath sounds normal. No respiratory distress. She has no wheezes.  Abdominal: Soft. Bowel sounds are normal. There is no tenderness.  Musculoskeletal: She exhibits no edema or tenderness.  Lymphadenopathy:    She has no cervical adenopathy.  Skin: No rash noted. No erythema.  Psychiatric: She has a normal mood and affect. Her behavior is normal.    BP (!) 100/54 (BP Location: Left Arm, Patient Position: Sitting, Cuff Size: Normal)   Pulse 93   Temp 98.6 F (37 C) (Oral)   Resp 18   Ht 5' 2"  (1.575 m)   Wt 148 lb 6.4 oz (67.3 kg)   SpO2 94%   BMI 27.14 kg/m  Wt Readings from Last 3 Encounters:  01/23/17 148 lb 6.4 oz (67.3 kg)  11/30/16 149 lb  6.4 oz (67.8 kg)  11/16/16 150 lb (68 kg)     Lab Results  Component Value Date   WBC 8.1 12/27/2016   HGB 12.1 12/27/2016   HCT 37.2 12/27/2016   PLT 388 01/19/2017   GLUCOSE 189 (H) 12/27/2016   CHOL 201 (H) 12/27/2016   TRIG 184.0 (H) 12/27/2016   HDL 43.10 12/27/2016   LDLDIRECT 188.8 06/30/2013   LDLCALC 121 (H) 12/27/2016   ALT 11 12/27/2016   AST 10 12/27/2016   NA 138 12/27/2016   K 4.9 12/27/2016   CL 103 12/27/2016   CREATININE 0.75 12/27/2016   BUN 10 12/27/2016   CO2 27 12/27/2016  TSH 1.96 12/27/2016   HGBA1C 7.5 (H) 12/27/2016   MICROALBUR 1.5 01/10/2016    Mm Screening Breast Tomo Bilateral  Result Date: 09/18/2016 CLINICAL DATA:  Screening. EXAM: 2D DIGITAL SCREENING BILATERAL MAMMOGRAM WITH CAD AND ADJUNCT TOMO COMPARISON:  Previous exam(s). ACR Breast Density Category c: The breast tissue is heterogeneously dense, which may obscure small masses. FINDINGS: There are no findings suspicious for malignancy. Images were processed with CAD. IMPRESSION: No mammographic evidence of malignancy. A result letter of this screening mammogram will be mailed directly to the patient. RECOMMENDATION: Screening mammogram in one year. (Code:SM-B-01Y) BI-RADS CATEGORY  1: Negative. Electronically Signed   By: Lovey Newcomer M.D.   On: 09/18/2016 16:13       Assessment & Plan:   Problem List Items Addressed This Visit    Chest pain    With headache and throat and chest symptoms as outlined.  EKG - SR with TWI in III and aVF.  Discussed with her my concerns regarding these changes and her chest pain and symptoms.  Wanted to send to ER for further w/up and evaluation.  She declined.  Declines further labs and cxr.  Will increase protonix as outlined.  Treat allergies. Possible viral syndrome.  Rest.  Fluids.  Again stressed importance of further evaluation.  She declines.  States she will call with update tomorrow and if any change in symptoms, will be evaluated immediately.         Depression    Doing well on prozac.  Follow.        Diabetes mellitus ([place])    Low carb diet and exercise.  Sugars improved.  Follow met b and a1c.  Keep up to date with eye checks.        Environmental allergies    Continue saline nasal spray.  Use flonase nasal spray as directed.  Follow.        GERD (gastroesophageal reflux disease)    On protonix.  Some of these symptoms could be aggravated by acid reflux.  Increase protonix to bid.  Follow symptoms.         Hypercholesterolemia    On pravastatin.  Low cholesterol diet and exercise.  Follow lipid panel and liver function tests.        Hypothyroidism    On thyroid replacement.  Follow tsh.         Other Visit Diagnoses    Shortness of breath    -  Primary   Relevant Orders   EKG 12-Lead (Completed)       Einar Pheasant, MD

## 2017-01-23 NOTE — Progress Notes (Signed)
Pre-visit discussion using our clinic review tool. No additional management support is needed unless otherwise documented below in the visit note.  

## 2017-01-24 ENCOUNTER — Telehealth: Payer: Self-pay | Admitting: Internal Medicine

## 2017-01-24 NOTE — Telephone Encounter (Signed)
Pt called and stated that she is feeling ok. She thinks that it was her acid reflux, she took the extra pill as advised last night.

## 2017-01-24 NOTE — Telephone Encounter (Signed)
Noted.  I am glad she is feeling better.  Is she interested in doing any other w/up at this time (I.e., cardiac w/up, etc).  If no, then have her monitor symptoms and let us know if any problems.  Also, I will have someone contact her with a f/u appt.

## 2017-01-24 NOTE — Telephone Encounter (Signed)
pls advise

## 2017-01-24 NOTE — Telephone Encounter (Signed)
Pt informed she did not want to do further work up right now. Let me know how far you want her follow up and we can make it.

## 2017-01-24 NOTE — Telephone Encounter (Signed)
I can see her at 12:00 on 02/16/17

## 2017-01-25 ENCOUNTER — Other Ambulatory Visit: Payer: Self-pay | Admitting: Internal Medicine

## 2017-01-25 NOTE — Telephone Encounter (Signed)
Patient has several app on that day with other providers. I have made app for 02-23-17 at 12:00. I called pt l/m to call office.

## 2017-01-25 NOTE — Telephone Encounter (Signed)
lov 3/201/18 Can patient take Diclofenac and meloxicam together please advise?

## 2017-01-25 NOTE — Telephone Encounter (Signed)
Pt called requesting these refills. Pt wants make sure the the pantoprazole is increased to 2 tablets daily. Please advise, thank you!  [place], [place]  Call pt @ [phone]

## 2017-01-26 NOTE — Telephone Encounter (Signed)
Left message to call.

## 2017-01-26 NOTE — Telephone Encounter (Signed)
Pt informed of app date and time had no further questions

## 2017-01-26 NOTE — Telephone Encounter (Signed)
I refilled the medications requested except for diclofenac and meloxicam.  (I did change the protonix to bid).  She cannot take the diclofenac and meloxicam together.  Need to clarify which medication she is taking.

## 2017-01-28 ENCOUNTER — Encounter: Payer: Self-pay | Admitting: Internal Medicine

## 2017-01-28 DIAGNOSIS — R079 Chest pain, unspecified: Secondary | ICD-10-CM | POA: Insufficient documentation

## 2017-01-28 HISTORY — DX: Chest pain, unspecified: R07.9

## 2017-01-28 NOTE — Assessment & Plan Note (Signed)
On pravastatin.  Low cholesterol diet and exercise.  Follow lipid panel and liver function tests.   

## 2017-01-28 NOTE — Assessment & Plan Note (Signed)
Continue saline nasal spray.  Use flonase nasal spray as directed.  Follow.

## 2017-01-28 NOTE — Assessment & Plan Note (Signed)
Low carb diet and exercise.  Sugars improved.  Follow met b and a1c.  Keep up to date with eye checks.

## 2017-01-28 NOTE — Assessment & Plan Note (Signed)
Doing well on prozac.  Follow  

## 2017-01-28 NOTE — Assessment & Plan Note (Addendum)
On protonix.  Some of these symptoms could be aggravated by acid reflux.  Increase protonix to bid.  Follow symptoms.

## 2017-01-28 NOTE — Assessment & Plan Note (Signed)
On thyroid replacement.  Follow tsh.  

## 2017-01-28 NOTE — Assessment & Plan Note (Signed)
With headache and throat and chest symptoms as outlined.  EKG - SR with TWI in III and aVF.  Discussed with her my concerns regarding these changes and her chest pain and symptoms.  Wanted to send to ER for further w/up and evaluation.  She declined.  Declines further labs and cxr.  Will increase protonix as outlined.  Treat allergies. Possible viral syndrome.  Rest.  Fluids.  Again stressed importance of further evaluation.  She declines.  States she will call with update tomorrow and if any change in symptoms, will be evaluated immediately.

## 2017-01-30 ENCOUNTER — Telehealth: Payer: Self-pay | Admitting: Internal Medicine

## 2017-01-30 NOTE — Telephone Encounter (Signed)
Left message to return call to office.

## 2017-01-30 NOTE — Telephone Encounter (Signed)
Productive cough with green mucus started over the weekend and patient is afebrile , denies body aches, patient has clear nasal discharge. Stated she as been exposed to son whom has been coughing for a month. Denies SOB or wheezing.

## 2017-01-30 NOTE — Telephone Encounter (Signed)
With her previous symptoms and now with increased cough and congestion, needs to be evaluated to confirm diagnosis and what treatment necessary.

## 2017-01-30 NOTE — Telephone Encounter (Signed)
Pt called and stated that she is having a productive cough with green phlegm/mucous, but when she blows her nose it is clear. Please advise, thank you!  Call pt @ [phone]

## 2017-02-01 ENCOUNTER — Ambulatory Visit (INDEPENDENT_AMBULATORY_CARE_PROVIDER_SITE_OTHER): Payer: Medicare HMO

## 2017-02-01 ENCOUNTER — Encounter: Payer: Self-pay | Admitting: Family

## 2017-02-01 ENCOUNTER — Telehealth: Payer: Self-pay | Admitting: Family

## 2017-02-01 ENCOUNTER — Ambulatory Visit (INDEPENDENT_AMBULATORY_CARE_PROVIDER_SITE_OTHER): Payer: Medicare HMO | Admitting: Family

## 2017-02-01 VITALS — BP 114/64 | HR 95 | Temp 98.6°F | Wt 146.1 lb

## 2017-02-01 DIAGNOSIS — R918 Other nonspecific abnormal finding of lung field: Secondary | ICD-10-CM | POA: Diagnosis not present

## 2017-02-01 DIAGNOSIS — R0602 Shortness of breath: Secondary | ICD-10-CM | POA: Diagnosis not present

## 2017-02-01 MED ORDER — ALBUTEROL SULFATE HFA 108 (90 BASE) MCG/ACT IN AERS: 2.0000 | INHALATION_SPRAY | Freq: Four times a day (QID) | RESPIRATORY_TRACT | 1 refills | Status: DC | PRN

## 2017-02-01 MED ORDER — DOXYCYCLINE HYCLATE 100 MG PO TABS: 100.0000 mg | ORAL_TABLET | Freq: Two times a day (BID) | ORAL | 0 refills | Status: DC

## 2017-02-01 NOTE — Telephone Encounter (Signed)
Can you place on schedule.

## 2017-02-01 NOTE — Telephone Encounter (Signed)
I can see her 02/05/17 at 12:00.  Work in for this.  Let her know we are not in the office tomorrow.  If any acute symptoms prior, she needs to be seen.

## 2017-02-01 NOTE — Telephone Encounter (Signed)
Call pt  She has nodule in left lung . For further info, we will order a ct chest. This has been ordered  Advise her to stay on doxycycline and albuturol

## 2017-02-01 NOTE — Patient Instructions (Addendum)
Follow up appointment next week  Referral to pulmonology  Use albuterol every 6 hours for first 24 hours to get good medication into the lungs and loosen congestion; after, you may use as needed and eventually stop all together when cough resolves.  Increase intake of clear fluids. Congestion is best treated by hydration, when mucus is wetter, it is thinner, less sticky, and easier to expel from the body, either through coughing up drainage, or by blowing your nose.   Get plenty of rest.   Use saline nasal drops and blow your nose frequently. Run a humidifier at night and elevate the head of the bed. Vicks Vapor rub will help with congestion and cough. Steam showers and sinus massage for congestion.   Use Acetaminophen or Ibuprofen as needed for fever or pain. Avoid second hand smoke. Even the smallest exposure will worsen symptoms.   Over the counter medications you can try include Delsym for cough, a decongestant for congestion, and Mucinex or Robitussin as an expectorant. Be sure to just get the plain Mucinex or Robitussin that just has one medication (Guaifenesen). We don't recommend the combination products. Note, be sure to drink two glasses of water with each dose of Mucinex as the medication will not work well without adequate hydration.   You can also try a teaspoon of honey to see if this will help reduce cough. Throat lozenges can sometimes be beneficial as well.    This illness will typically last 7 - 10 days.   Please follow up with our clinic if you develop a fever greater than 101 F, symptoms worsen, or do not resolve in the next week.

## 2017-02-01 NOTE — Telephone Encounter (Signed)
Can you place on schedule at 1200 pm please ? Thanks

## 2017-02-01 NOTE — Assessment & Plan Note (Addendum)
Walked with patient down hall way several times and we talked while I monitored her SAo2, HR. Sa stayed at 64, at the very end it went briefly to 88%. HR at highest was 117. She wasn't labored in speech and no acute respiratory distress. AFTER neb- we walked and sa02 stayed at 95. Patient felt much better which was very reassuring to see. Afebrile. No adventitious lung sounds. Declines EKG today. She is adamant that she has no chest pain. Working diagnosis of uri likely causing symptoms. Will start antibiotic, albuterol inhaler, and have CXR. We jointly agreed for pulmonology consult as patient does appear to underlying lung disease and may need PFTs. Close follow up.

## 2017-02-01 NOTE — Telephone Encounter (Signed)
Patient has been notified

## 2017-02-01 NOTE — Telephone Encounter (Signed)
Patient called back and stated she does not want to see another provider she would wait til her appointment on 02/24/17 at 12  With PCP , but this appointment is not on your schedule. Patient has no follow up with PCP scheduled per chart.

## 2017-02-01 NOTE — Progress Notes (Signed)
Subjective:    Patient ID: Jody Wang, female    DOB: [birth year], 71 y.o.   MRN: [id number]  CC: Jody Wang is a 71 y.o. female who presents today for an acute visit.    HPI: Chief complaint of sinus congestion x one week, unchanged. Endorses productive cough, sinus pressure. Think I have 'sinus infection'.   SOB for past couple of weeks. No CP, palpitations, fever, chills, left arm pain.   No LE swelling, orthopnea.   No exercise besides walking her dog.   Taking tyelonol with relief.   History of seasonal allergies. Former smoker.   PCP last week for SOB, sore thrat HISTORY:  Past Medical History:  Diagnosis Date  . Depression   . Diabetes mellitus without complication ([place])   . GERD (gastroesophageal reflux disease)   . Heart murmur    slight - followed by PCP  . Hypercholesterolemia   . Hypothyroidism   . Osteoarthritis of knee   . Wears dentures    partial lower   Past Surgical History:  Procedure Laterality Date  . ABDOMINAL HYSTERECTOMY  1985   ovaries not removed  . BREAST CYST EXCISION Right   . BREAST CYST EXCISION Left   . CATARACT EXTRACTION W/PHACO Right 02/16/2016   Procedure: CATARACT EXTRACTION PHACO AND INTRAOCULAR LENS PLACEMENT ([place]) RIGHT ;  Surgeon: Leandrew Koyanagi, MD;  Location: [facility];  Service: Ophthalmology;  Laterality: Right;  DIABETIC - oral meds  . CATARACT EXTRACTION W/PHACO Left 03/15/2016   Procedure: CATARACT EXTRACTION PHACO AND INTRAOCULAR LENS PLACEMENT ([facility])left eye;  Surgeon: Leandrew Koyanagi, MD;  Location: [facility];  Service: Ophthalmology;  Laterality: Left;  DIABETIC - oral meds  . INNER EAR SURGERY     Family History  Problem Relation Age of Onset  . Diabetes Father   . Coronary artery disease Father     s/p CABG  . Hypertension Father   . Hypercholesterolemia Father   . Hypertension Brother   . Hypertension Sister   . Glaucoma Sister   . Diabetes      niece  .  Breast cancer Maternal Aunt   . Breast cancer Paternal Aunt   . Cervical cancer Paternal Aunt   . Breast cancer Cousin   . Breast cancer Other     Allergies: Codeine; Meperidine and related; Oxycontin [oxycodone hcl]; Zetia [ezetimibe]; and Tape Current Outpatient Prescriptions on File Prior to Visit  Medication Sig Dispense Refill  . ALPRAZolam (XANAX) 0.25 MG tablet TAKE 1 TABLET AT BEDTIME AS NEEDED FOR SLEEP 90 tablet 0  . carbamide peroxide (DEBROX) 6.5 % otic solution Place 5 drops into both ears 2 (two) times daily. 15 mL 0  . diclofenac (VOLTAREN) 50 MG EC tablet Take 1 tablet (50 mg total) by mouth 2 (two) times daily as needed. 180 tablet 0  . FLUoxetine (PROZAC) 20 MG capsule TAKE 1 CAPSULE EVERY DAY 90 capsule 1  . fluticasone (FLONASE) 50 MCG/ACT nasal spray PLACE 2 SPRAYS INTO BOTH NOSTRILS DAILY. 48 g 3  . glipiZIDE (GLUCOTROL XL) 2.5 MG 24 hr tablet TAKE 1 TABLET  DAILY WITH BREAKFAST. 90 tablet 1  . levocetirizine (XYZAL) 5 MG tablet Take 1 tablet (5 mg total) by mouth daily. 90 tablet 1  . levothyroxine (SYNTHROID, LEVOTHROID) 88 MCG tablet TAKE 1 TABLET EVERY DAY 90 tablet 2  . metFORMIN (GLUCOPHAGE XR) 500 MG 24 hr tablet Take 2 tablets (1,000 mg total) by mouth 2 (two) times daily. 360 tablet  1  . pantoprazole (PROTONIX) 40 MG tablet Take 1 tablet (40 mg total) by mouth 2 (two) times daily before a meal. 180 tablet 1  . pravastatin (PRAVACHOL) 40 MG tablet TAKE 1/2 TABLET EVERY DAY 45 tablet 3  . traZODone (DESYREL) 50 MG tablet TAKE 1 TABLET EVERY DAY 90 tablet 1   Current Facility-Administered Medications on File Prior to Visit  Medication Dose Route Frequency Provider Last Rate Last Dose  . glucose blood test strip STRP 1 each  1 each Other PRN Einar Pheasant, MD        Social History  Substance Use Topics  . Smoking status: Former Smoker    Packs/day: 1.00    Years: 23.00    Quit date: 11/06/1981  . Smokeless tobacco: Never Used  . Alcohol use 0.0 oz/week      Comment: rare    Review of Systems  Constitutional: Negative for chills and fever.  HENT: Positive for congestion.   Respiratory: Positive for cough and shortness of breath. Negative for wheezing.   Cardiovascular: Negative for chest pain and palpitations.  Gastrointestinal: Negative for nausea and vomiting.      Objective:    BP 114/64 (BP Location: Left Arm, Patient Position: Sitting, Cuff Size: Normal)   Pulse 95   Temp 98.6 F (37 C) (Oral)   Wt 146 lb 2 oz (66.3 kg)   SpO2 95%   BMI 26.73 kg/m    Physical Exam  Constitutional: She appears well-developed and well-nourished.  HENT:  Head: Normocephalic and atraumatic.  Right Ear: Hearing, tympanic membrane, external ear and ear canal normal. No drainage, swelling or tenderness. No foreign bodies. Tympanic membrane is not erythematous and not bulging. No middle ear effusion. No decreased hearing is noted.  Left Ear: Hearing, tympanic membrane, external ear and ear canal normal. No drainage, swelling or tenderness. No foreign bodies. Tympanic membrane is not erythematous and not bulging.  No middle ear effusion. No decreased hearing is noted.  Nose: Nose normal. No rhinorrhea. Right sinus exhibits no maxillary sinus tenderness and no frontal sinus tenderness. Left sinus exhibits no maxillary sinus tenderness and no frontal sinus tenderness.  Mouth/Throat: Uvula is midline, oropharynx is clear and moist and mucous membranes are normal. No oropharyngeal exudate, posterior oropharyngeal edema, posterior oropharyngeal erythema or tonsillar abscesses.  Eyes: Conjunctivae are normal.  Cardiovascular: Regular rhythm, normal heart sounds and normal pulses.   Pulmonary/Chest: Effort normal and breath sounds normal. She has no wheezes. She has no rhonchi. She has no rales.  Lymphadenopathy:       Head (right side): No submental, no submandibular, no tonsillar, no preauricular, no posterior auricular and no occipital adenopathy present.        Head (left side): No submental, no submandibular, no tonsillar, no preauricular, no posterior auricular and no occipital adenopathy present.    She has no cervical adenopathy.  Neurological: She is alert.  Skin: Skin is warm and dry.  Psychiatric: She has a normal mood and affect. Her speech is normal and behavior is normal. Thought content normal.  Vitals reviewed. Patient felt significantly better after albuterol treatment. Lung sounds clear and increased      Assessment & Plan:   Problem List Items Addressed This Visit      Other   SOB (shortness of breath) on exertion - Primary    Walked with patient down hall way several times and we talked while I monitored her SAo2, HR. Sa stayed at 102, at the very end  it went briefly to 88%. HR at highest was 117. She wasn't labored in speech and no acute respiratory distress. AFTER neb- we walked and sa02 stayed at 95. Patient felt much better which was very reassuring to see. Afebrile. No adventitious lung sounds. Declines EKG today. She is adamant that she has no chest pain. Working diagnosis of uri likely causing symptoms. Will start antibiotic, albuterol inhaler, and have CXR. We jointly agreed for pulmonology consult as patient does appear to underlying lung disease and may need PFTs. Close follow up.       Relevant Medications   albuterol (PROVENTIL HFA) 108 (90 Base) MCG/ACT inhaler   doxycycline (VIBRA-TABS) 100 MG tablet   Other Relevant Orders   Ambulatory referral to Pulmonology   DG Chest 2 View           I am having Ms. Lasure start on albuterol and doxycycline. I am also having her maintain her carbamide peroxide, levocetirizine, levothyroxine, pravastatin, diclofenac, metFORMIN, traZODone, FLUoxetine, ALPRAZolam, fluticasone, pantoprazole, and glipiZIDE. We will continue to administer glucose blood.   Meds ordered this encounter  Medications  . albuterol (PROVENTIL HFA) 108 (90 Base) MCG/ACT inhaler    Sig: Inhale  2 puffs into the lungs every 6 (six) hours as needed for wheezing or shortness of breath.    Dispense:  1 Inhaler    Refill:  1    Order Specific Question:   Supervising Provider    Answer:   Derrel Nip, TERESA L [[id number]]  . doxycycline (VIBRA-TABS) 100 MG tablet    Sig: Take 1 tablet (100 mg total) by mouth 2 (two) times daily.    Dispense:  10 tablet    Refill:  0    Order Specific Question:   Supervising Provider    Answer:   Crecencio Mc [[id number]]    Return precautions given.   Risks, benefits, and alternatives of the medications and treatment plan prescribed today were discussed, and patient expressed understanding.   Education regarding symptom management and diagnosis given to patient on AVS.  Continue to follow with Einar Pheasant, MD for routine health maintenance.   Tonia Ghent and I agreed with plan.   Mable Paris, FNP

## 2017-02-01 NOTE — Telephone Encounter (Signed)
Jody Wang placed on schedule

## 2017-02-01 NOTE — Telephone Encounter (Addendum)
Patient seen today by Joycelyn Schmid today. But still would like to see Dr Nicki Reaper today to discuss findings of chest xray.

## 2017-02-05 ENCOUNTER — Ambulatory Visit (INDEPENDENT_AMBULATORY_CARE_PROVIDER_SITE_OTHER): Payer: Medicare HMO | Admitting: Internal Medicine

## 2017-02-05 ENCOUNTER — Encounter: Payer: Self-pay | Admitting: Internal Medicine

## 2017-02-05 DIAGNOSIS — R938 Abnormal findings on diagnostic imaging of other specified body structures: Secondary | ICD-10-CM | POA: Diagnosis not present

## 2017-02-05 DIAGNOSIS — E119 Type 2 diabetes mellitus without complications: Secondary | ICD-10-CM

## 2017-02-05 DIAGNOSIS — R0602 Shortness of breath: Secondary | ICD-10-CM | POA: Diagnosis not present

## 2017-02-05 DIAGNOSIS — K219 Gastro-esophageal reflux disease without esophagitis: Secondary | ICD-10-CM

## 2017-02-05 DIAGNOSIS — R9389 Abnormal findings on diagnostic imaging of other specified body structures: Secondary | ICD-10-CM

## 2017-02-05 MED ORDER — METFORMIN HCL ER 500 MG PO TB24: 1000.0000 mg | ORAL_TABLET | Freq: Two times a day (BID) | ORAL | 1 refills | Status: DC

## 2017-02-05 MED ORDER — DICLOFENAC SODIUM 50 MG PO TBEC: 50.0000 mg | DELAYED_RELEASE_TABLET | Freq: Two times a day (BID) | ORAL | 0 refills | Status: DC | PRN

## 2017-02-05 MED ORDER — DOXYCYCLINE HYCLATE 100 MG PO TABS: 100.0000 mg | ORAL_TABLET | Freq: Two times a day (BID) | ORAL | 0 refills | Status: DC

## 2017-02-05 NOTE — Progress Notes (Signed)
Patient ID: Jody Wang, female   DOB: January 26, 1946, 71 y.o.   MRN: [id number]   Subjective:    Patient ID: Jody Wang, female    DOB: 18-Jan-1946, 71 y.o.   MRN: [id number]  HPI  Patient here as a work in for cough and congestion.  She is accompanied by her son Joneen Caraway.  History obtained from both of them.  She was seen by Mable Paris 02/01/17 for sob.  See note.  Reviewed.  Placed on doxycycline.  Using albuterol inhaler.  CXR revealed a 51mm nodular opacity left upper lobe.  She is here to discuss these results as well.  She states she does feel better. Breathing is better.  No increased sob.  No acid reflux. No abdominal pain.  Bowels moving.  Does report some persistent congestion and frontal headache (she contributes to sinus).  She completed doxycycline, but still with persistent symptoms.  Only had 5 days of doxycycline.     Past Medical History:  Diagnosis Date  . Depression   . Diabetes mellitus without complication ([place])   . GERD (gastroesophageal reflux disease)   . Heart murmur    slight - followed by PCP  . Hypercholesterolemia   . Hypothyroidism   . Osteoarthritis of knee   . Wears dentures    partial lower   Past Surgical History:  Procedure Laterality Date  . ABDOMINAL HYSTERECTOMY  1985   ovaries not removed  . BREAST CYST EXCISION Right   . BREAST CYST EXCISION Left   . CATARACT EXTRACTION W/PHACO Right 02/16/2016   Procedure: CATARACT EXTRACTION PHACO AND INTRAOCULAR LENS PLACEMENT ([place] [facility]) RIGHT ;  Surgeon: Leandrew Koyanagi, MD;  Location: [facility];  Service: Ophthalmology;  Laterality: Right;  DIABETIC - oral meds  . CATARACT EXTRACTION W/PHACO Left 03/15/2016   Procedure: CATARACT EXTRACTION PHACO AND INTRAOCULAR LENS PLACEMENT ([facility])left eye;  Surgeon: Leandrew Koyanagi, MD;  Location: [facility];  Service: Ophthalmology;  Laterality: Left;  DIABETIC - oral meds  . INNER EAR SURGERY     Family History  Problem Relation Age of  Onset  . Diabetes Father   . Coronary artery disease Father     s/p CABG  . Hypertension Father   . Hypercholesterolemia Father   . Hypertension Brother   . Hypertension Sister   . Glaucoma Sister   . Diabetes      niece  . Breast cancer Maternal Aunt   . Breast cancer Paternal Aunt   . Cervical cancer Paternal Aunt   . Breast cancer Cousin   . Breast cancer Other    Social History   Social History  . Marital status: Widowed    Spouse name: N/A  . Number of children: 3  . Years of education: N/A   Social History Main Topics  . Smoking status: Former Smoker    Packs/day: 1.00    Years: 23.00    Quit date: 11/06/1981  . Smokeless tobacco: Never Used  . Alcohol use 0.0 oz/week     Comment: rare  . Drug use: No  . Sexual activity: No   Other Topics Concern  . None   Social History Narrative  . None    Outpatient Encounter Prescriptions as of 02/05/2017  Medication Sig  . albuterol (PROVENTIL HFA) 108 (90 Base) MCG/ACT inhaler Inhale 2 puffs into the lungs every 6 (six) hours as needed for wheezing or shortness of breath.  . ALPRAZolam (XANAX) 0.25 MG tablet TAKE 1 TABLET AT BEDTIME AS  NEEDED FOR SLEEP  . carbamide peroxide (DEBROX) 6.5 % otic solution Place 5 drops into both ears 2 (two) times daily.  . diclofenac (VOLTAREN) 50 MG EC tablet Take 1 tablet (50 mg total) by mouth 2 (two) times daily as needed.  . doxycycline (VIBRA-TABS) 100 MG tablet Take 1 tablet (100 mg total) by mouth 2 (two) times daily.  Marland Kitchen FLUoxetine (PROZAC) 20 MG capsule TAKE 1 CAPSULE EVERY DAY  . fluticasone (FLONASE) 50 MCG/ACT nasal spray PLACE 2 SPRAYS INTO BOTH NOSTRILS DAILY.  Marland Kitchen glipiZIDE (GLUCOTROL XL) 2.5 MG 24 hr tablet TAKE 1 TABLET  DAILY WITH BREAKFAST.  Marland Kitchen levocetirizine (XYZAL) 5 MG tablet Take 1 tablet (5 mg total) by mouth daily.  Marland Kitchen levothyroxine (SYNTHROID, LEVOTHROID) 88 MCG tablet TAKE 1 TABLET EVERY DAY  . metFORMIN (GLUCOPHAGE XR) 500 MG 24 hr tablet Take 2 tablets (1,000 mg  total) by mouth 2 (two) times daily.  . pantoprazole (PROTONIX) 40 MG tablet Take 1 tablet (40 mg total) by mouth 2 (two) times daily before a meal.  . pravastatin (PRAVACHOL) 40 MG tablet TAKE 1/2 TABLET EVERY DAY  . traZODone (DESYREL) 50 MG tablet TAKE 1 TABLET EVERY DAY  . [DISCONTINUED] diclofenac (VOLTAREN) 50 MG EC tablet Take 1 tablet (50 mg total) by mouth 2 (two) times daily as needed.  . [DISCONTINUED] doxycycline (VIBRA-TABS) 100 MG tablet Take 1 tablet (100 mg total) by mouth 2 (two) times daily.  . [DISCONTINUED] metFORMIN (GLUCOPHAGE XR) 500 MG 24 hr tablet Take 2 tablets (1,000 mg total) by mouth 2 (two) times daily.   Facility-Administered Encounter Medications as of 02/05/2017  Medication  . glucose blood test strip STRP 1 each    Review of Systems  Constitutional: Negative for appetite change and unexpected weight change.  HENT: Positive for congestion and sinus pressure.   Respiratory: Positive for cough. Negative for chest tightness and shortness of breath.   Cardiovascular: Negative for chest pain, palpitations and leg swelling.  Gastrointestinal: Negative for abdominal pain, diarrhea, nausea and vomiting.  Musculoskeletal: Negative for back pain and joint swelling.  Skin: Negative for color change and rash.  Neurological: Negative for dizziness, light-headedness and headaches.  Psychiatric/Behavioral: Negative for agitation and dysphoric mood.       Objective:    Physical Exam  Constitutional: She appears well-developed and well-nourished. No distress.  HENT:  Nose: Nose normal.  Mouth/Throat: Oropharynx is clear and moist.  Neck: Neck supple. No thyromegaly present.  Cardiovascular: Normal rate and regular rhythm.   Pulmonary/Chest: Breath sounds normal. No respiratory distress. She has no wheezes.  Abdominal: Soft. Bowel sounds are normal. There is no tenderness.  Musculoskeletal: She exhibits no edema or tenderness.  Lymphadenopathy:    She has no  cervical adenopathy.  Skin: No rash noted. No erythema.  Psychiatric: She has a normal mood and affect. Her behavior is normal.    BP 110/60 (BP Location: Left Arm, Patient Position: Sitting, Cuff Size: Normal)   Pulse 82   Temp 99.8 F (37.7 C) (Oral)   Resp 16   Wt 144 lb 9.6 oz (65.6 kg)   SpO2 94%   BMI 26.45 kg/m  Wt Readings from Last 3 Encounters:  02/05/17 144 lb 9.6 oz (65.6 kg)  02/01/17 146 lb 2 oz (66.3 kg)  01/23/17 148 lb 6.4 oz (67.3 kg)     Lab Results  Component Value Date   WBC 8.1 12/27/2016   HGB 12.1 12/27/2016   HCT 37.2 12/27/2016  PLT 388 01/19/2017   GLUCOSE 189 (H) 12/27/2016   CHOL 201 (H) 12/27/2016   TRIG 184.0 (H) 12/27/2016   HDL 43.10 12/27/2016   LDLDIRECT 188.8 06/30/2013   LDLCALC 121 (H) 12/27/2016   ALT 11 12/27/2016   AST 10 12/27/2016   NA 138 12/27/2016   K 4.9 12/27/2016   CL 103 12/27/2016   CREATININE 0.75 12/27/2016   BUN 10 12/27/2016   CO2 27 12/27/2016   TSH 1.96 12/27/2016   HGBA1C 7.5 (H) 12/27/2016   MICROALBUR 1.5 01/10/2016    Mm Screening Breast Tomo Bilateral  Result Date: 09/18/2016 CLINICAL DATA:  Screening. EXAM: 2D DIGITAL SCREENING BILATERAL MAMMOGRAM WITH CAD AND ADJUNCT TOMO COMPARISON:  Previous exam(s). ACR Breast Density Category c: The breast tissue is heterogeneously dense, which may obscure small masses. FINDINGS: There are no findings suspicious for malignancy. Images were processed with CAD. IMPRESSION: No mammographic evidence of malignancy. A result letter of this screening mammogram will be mailed directly to the patient. RECOMMENDATION: Screening mammogram in one year. (Code:SM-B-01Y) BI-RADS CATEGORY  1: Negative. Electronically Signed   By: Lovey Newcomer M.D.   On: 09/18/2016 16:13       Assessment & Plan:   Problem List Items Addressed This Visit    Abnormal CXR    6 mm nodule as outlined on cxr.  Scheduled for CT chest.  Discussed with her and her son today.  Follow.        Diabetes  mellitus ([place])    Discussed sugars.  Stable.  Low carb diet.  Follow.       Relevant Medications   metFORMIN (GLUCOPHAGE XR) 500 MG 24 hr tablet   GERD (gastroesophageal reflux disease)    Symptoms controlled on protonix.  Follow.        SOB (shortness of breath) on exertion    Had presented with sob with exertion last week.  Saw Mable Paris.  See note.  Placed on doxycycline.  Symptoms have improved.  Persistent cough, congestion and sinus pressure.  Extend out doxycycline.  Continue inhalers.  Continue flonase.  mucinex as needed.  Follow closely.        Relevant Medications   doxycycline (VIBRA-TABS) 100 MG tablet       Einar Pheasant, MD

## 2017-02-05 NOTE — Progress Notes (Signed)
Pre-visit discussion using our clinic review tool. No additional management support is needed unless otherwise documented below in the visit note.  

## 2017-02-08 ENCOUNTER — Ambulatory Visit: Payer: Medicare HMO | Admitting: Family

## 2017-02-11 ENCOUNTER — Encounter: Payer: Self-pay | Admitting: Internal Medicine

## 2017-02-11 DIAGNOSIS — R9389 Abnormal findings on diagnostic imaging of other specified body structures: Secondary | ICD-10-CM | POA: Insufficient documentation

## 2017-02-11 NOTE — Assessment & Plan Note (Signed)
Had presented with sob with exertion last week.  Saw Jody Wang.  See note.  Placed on doxycycline.  Symptoms have improved.  Persistent cough, congestion and sinus pressure.  Extend out doxycycline.  Continue inhalers.  Continue flonase.  mucinex as needed.  Follow closely.

## 2017-02-11 NOTE — Assessment & Plan Note (Signed)
6 mm nodule as outlined on cxr.  Scheduled for CT chest.  Discussed with her and her son today.  Follow.

## 2017-02-11 NOTE — Assessment & Plan Note (Signed)
Discussed sugars.  Stable.  Low carb diet.  Follow.

## 2017-02-11 NOTE — Assessment & Plan Note (Signed)
Symptoms controlled on protonix.  Follow.   

## 2017-02-12 ENCOUNTER — Ambulatory Visit
Admission: RE | Admit: 2017-02-12 | Discharge: 2017-02-12 | Disposition: A | Discharge status: Home / Self Care | Payer: Medicare HMO | Source: Ambulatory Visit | Attending: Family | Admitting: Family

## 2017-02-12 ENCOUNTER — Telehealth: Payer: Self-pay | Admitting: Internal Medicine

## 2017-02-12 DIAGNOSIS — R590 Localized enlarged lymph nodes: Secondary | ICD-10-CM | POA: Insufficient documentation

## 2017-02-12 DIAGNOSIS — I700 Atherosclerosis of aorta: Secondary | ICD-10-CM | POA: Diagnosis not present

## 2017-02-12 DIAGNOSIS — I2510 Atherosclerotic heart disease of native coronary artery without angina pectoris: Secondary | ICD-10-CM | POA: Diagnosis not present

## 2017-02-12 DIAGNOSIS — R0602 Shortness of breath: Secondary | ICD-10-CM

## 2017-02-12 DIAGNOSIS — R918 Other nonspecific abnormal finding of lung field: Secondary | ICD-10-CM | POA: Diagnosis not present

## 2017-02-12 NOTE — Telephone Encounter (Signed)
Pt called stating that she is still coughing up green phlegm. PT states that she finished her medication on Saturday. Please advise, thank you!  Call pt @ [phone] (may leave msg)

## 2017-02-12 NOTE — Telephone Encounter (Signed)
Spoke with patient she is agreeable to see cardiologist as long as she can afford to.

## 2017-02-12 NOTE — Telephone Encounter (Signed)
Left voice mail to call back 

## 2017-02-12 NOTE — Telephone Encounter (Signed)
Patient calls stating still taking doxycycline, still having shortness of breath with exertion.  Using albuterol inhaler as needed for shortness .   Still having cough with green phlegm.   Feeling better than has in weeks.

## 2017-02-12 NOTE — Telephone Encounter (Signed)
I am glad she is feeling better.  States still sob with exertion.  Does she feel this is getting better.  I had discussed with her previously about further cardiac w/up as well as continuing to treat the infection.  If she is agreeable to cardiology referral, let me know and I will place the order for the referral.

## 2017-02-13 ENCOUNTER — Telehealth: Payer: Self-pay | Admitting: Family

## 2017-02-13 NOTE — Telephone Encounter (Signed)
Spoke with pt about CT  Explained I was consulting to ensure no further intervention and 3-6 month surveillance appropriate  Sent note to shawn perkins  Will hold on pulmonology until hear back from onc

## 2017-02-13 NOTE — Telephone Encounter (Signed)
Patient advised of below and verbalized understanding.  

## 2017-02-13 NOTE — Telephone Encounter (Signed)
Order placed for cardiology referral.  Someone will be contacting her with an appt date and time.  Keep [place] posted on how she is doing.

## 2017-02-13 NOTE — Telephone Encounter (Signed)
Left voice mail to call back 

## 2017-02-15 NOTE — Telephone Encounter (Signed)
close

## 2017-02-16 ENCOUNTER — Encounter (INDEPENDENT_AMBULATORY_CARE_PROVIDER_SITE_OTHER): Payer: Medicare HMO

## 2017-02-16 ENCOUNTER — Ambulatory Visit (INDEPENDENT_AMBULATORY_CARE_PROVIDER_SITE_OTHER): Payer: Self-pay | Admitting: Vascular Surgery

## 2017-02-19 ENCOUNTER — Telehealth: Payer: Self-pay | Admitting: Family

## 2017-02-19 NOTE — Telephone Encounter (Signed)
I have pulled her referral. They have called her to schedule the appointment. I had asked them to call her again.

## 2017-02-19 NOTE — Telephone Encounter (Signed)
Spoke with patient about seeing onc- Dr Genevive Bi.   She agreed.   Will let Shawn perkins know.   Has some congestion; no fever. Advised mucinex which patient thought agreeable.     Melissa,  Will you look into cardiology referral? Patient is inquiring.

## 2017-02-20 ENCOUNTER — Telehealth: Payer: Self-pay

## 2017-02-20 NOTE — Telephone Encounter (Signed)
Received notification from [facility]) that patient was in need of an appointment with Dr. Genevive Bi to review recent CT Scan. Patient has new diagnosis of Lung nodules and history of smoking. She was placed on schedule for Friday, 02/23/17 at 0830am. She will need to arrive at 0815am to office.  Call made to patient on both home and mobile numbers. No answer but messages left requesting return phone call on both machines.   Should she return phone call. Give patient above appointment information and let me know if she has any further questions or concerns.

## 2017-02-20 NOTE — Telephone Encounter (Signed)
Left message for patient to return call back.  

## 2017-02-20 NOTE — Telephone Encounter (Signed)
Pt called back returning your call. Please advise, thank you.  Call pt @ [phone]

## 2017-02-20 NOTE — Telephone Encounter (Signed)
Call pt and let her know what Jody Wang said about cardiology referral  Please have her let us know if she doesn't hear from them in another week

## 2017-02-21 NOTE — Telephone Encounter (Signed)
Left voice mail to call back 

## 2017-02-21 NOTE — Telephone Encounter (Signed)
Call made to patient once again. Patient given appointment. She would like to change this for 11am on 4/20. This has been done. She was given all information including directions to office.

## 2017-02-23 ENCOUNTER — Ambulatory Visit (INDEPENDENT_AMBULATORY_CARE_PROVIDER_SITE_OTHER): Payer: Medicare HMO | Admitting: Cardiothoracic Surgery

## 2017-02-23 ENCOUNTER — Encounter: Payer: Self-pay | Admitting: Cardiothoracic Surgery

## 2017-02-23 VITALS — BP 120/60 | HR 105 | Temp 98.1°F | Ht 62.0 in | Wt 143.0 lb

## 2017-02-23 DIAGNOSIS — R911 Solitary pulmonary nodule: Secondary | ICD-10-CM | POA: Diagnosis not present

## 2017-02-23 NOTE — Patient Instructions (Signed)
I will contact you in 6 months so I could give you your appointment for your CT Scan and follow up appointment with Dr. Genevive Bi.

## 2017-02-23 NOTE — Progress Notes (Signed)
Patient ID: Jody Wang, female   DOB: 02-16-1946, 71 y.o.   MRN: [id number]  Chief Complaint  Patient presents with  . New Patient (Initial Visit)    Left Upper Lobe Lung Nodule    Referred By Dr. Nicki Reaper Reason for Referral bilateral pulmonary nodules and interstitial lung disease  HPI Location, Quality, Duration, Severity, Timing, Context, Modifying Factors, Associated Signs and Symptoms.  Jody Wang is a 71 y.o. female.  She was in her usual state of health until a few weeks ago when she developed a sinus type infection. She felt that she had a bacterial sinusitis with some congestion in her chest. She's also had increasing shortness of breath over the last several months. Because of these symptoms she had a chest x-ray made. The chest x-ray revealed a nodule in the left upper lung zone and this was followed by a CT scan. The CT scan revealed a well-defined nodule within the left upper lobe measuring about 6 mm. There is a somewhat smaller and slightly irregular nodule in the right lung zone. In addition there were scattered mosaic patterns of a nonspecific interstitial pneumonitis. She states that she's been increasingly short of breath over the last several months. She is able to walk up a flight of stairs but does get limited with that. Today on her exam her oxygen saturations were in the low 90s and a review her chart has shown them to be in the mid to upper 90s in the past. She comes in today for my recommendations regarding management of her lung nodules.   Past Medical History:  Diagnosis Date  . Depression   . Diabetes mellitus without complication ([place])   . GERD (gastroesophageal reflux disease)   . Heart murmur    slight - followed by PCP  . Hypercholesterolemia   . Hypothyroidism   . Osteoarthritis of knee   . Wears dentures    partial lower    Past Surgical History:  Procedure Laterality Date  . ABDOMINAL HYSTERECTOMY  1985   ovaries not removed  .  BREAST CYST EXCISION Right   . BREAST CYST EXCISION Left   . CATARACT EXTRACTION W/PHACO Right 02/16/2016   Procedure: CATARACT EXTRACTION PHACO AND INTRAOCULAR LENS PLACEMENT ([facility]) RIGHT ;  Surgeon: Leandrew Koyanagi, MD;  Location: [facility];  Service: Ophthalmology;  Laterality: Right;  DIABETIC - oral meds  . CATARACT EXTRACTION W/PHACO Left 03/15/2016   Procedure: CATARACT EXTRACTION PHACO AND INTRAOCULAR LENS PLACEMENT ([facility])left eye;  Surgeon: Leandrew Koyanagi, MD;  Location: [facility];  Service: Ophthalmology;  Laterality: Left;  DIABETIC - oral meds  . INNER EAR SURGERY      Family History  Problem Relation Age of Onset  . Diabetes Father   . Coronary artery disease Father     s/p CABG  . Hypertension Father   . Hypercholesterolemia Father   . Hypertension Brother   . Hypertension Sister   . Glaucoma Sister   . Diabetes      niece  . Breast cancer Maternal Aunt   . Breast cancer Paternal Aunt   . Cervical cancer Paternal Aunt   . Breast cancer Cousin   . Breast cancer Other     Social History Social History  Substance Use Topics  . Smoking status: Former Smoker    Packs/day: 1.00    Years: 23.00    Quit date: 11/20/1983  . Smokeless tobacco: Never Used  . Alcohol use 0.0 oz/week  Comment: rare    Allergies  Allergen Reactions  . Codeine Shortness Of Breath    stops breathing  . Meperidine And Related Shortness Of Breath    Stops breathing  . Oxycontin [Oxycodone Hcl] Shortness Of Breath    Stops breathing  . Zetia [Ezetimibe]     Doesn't remember reaction  . Tape Rash    Paper tape ok    Current Outpatient Prescriptions  Medication Sig Dispense Refill  . albuterol (PROVENTIL HFA) 108 (90 Base) MCG/ACT inhaler Inhale 2 puffs into the lungs every 6 (six) hours as needed for wheezing or shortness of breath. 1 Inhaler 1  . diclofenac (VOLTAREN) 50 MG EC tablet Take 1 tablet (50 mg total) by mouth 2 (two) times daily as needed. 180  tablet 0  . FLUoxetine (PROZAC) 20 MG capsule TAKE 1 CAPSULE EVERY DAY 90 capsule 1  . fluticasone (FLONASE) 50 MCG/ACT nasal spray PLACE 2 SPRAYS INTO BOTH NOSTRILS DAILY. 48 g 3  . glipiZIDE (GLUCOTROL XL) 2.5 MG 24 hr tablet TAKE 1 TABLET  DAILY WITH BREAKFAST. 90 tablet 1  . levocetirizine (XYZAL) 5 MG tablet Take 1 tablet (5 mg total) by mouth daily. 90 tablet 1  . levothyroxine (SYNTHROID, LEVOTHROID) 88 MCG tablet TAKE 1 TABLET EVERY DAY 90 tablet 2  . metFORMIN (GLUCOPHAGE XR) 500 MG 24 hr tablet Take 2 tablets (1,000 mg total) by mouth 2 (two) times daily. 360 tablet 1  . pantoprazole (PROTONIX) 40 MG tablet Take 1 tablet (40 mg total) by mouth 2 (two) times daily before a meal. 180 tablet 1  . pravastatin (PRAVACHOL) 40 MG tablet TAKE 1/2 TABLET EVERY DAY 45 tablet 3  . traZODone (DESYREL) 50 MG tablet TAKE 1 TABLET EVERY DAY 90 tablet 1   Current Facility-Administered Medications  Medication Dose Route Frequency Provider Last Rate Last Dose  . glucose blood test strip STRP 1 each  1 each Other PRN Einar Pheasant, MD          Review of Systems A complete review of systems was asked and was negative except for the following positive findingsIncreasing shortness of breath. Sinus type symptoms. Cough. No fever. No weight loss.  Blood pressure 120/60, pulse (!) 105, temperature 98.1 F (36.7 C), temperature source Oral, height 5\' 2"  (1.575 m), weight 143 lb (64.9 kg), SpO2 93 %.  Physical Exam CONSTITUTIONAL:  Pleasant, well-developed, well-nourished, and in no acute distress. EYES: Pupils equal and reactive to light, Sclera non-icteric EARS, NOSE, MOUTH AND THROAT:  The oropharynx was clear.  Dentition is good repair.  Oral mucosa pink and moist. LYMPH NODES:  Lymph nodes in the neck and axillae were normal RESPIRATORY:  Lungs were clear.  Normal respiratory effort without pathologic use of accessory muscles of respiration CARDIOVASCULAR: Heart was regular without murmurs.  There  were no carotid bruits. GI: The abdomen was soft, nontender, and nondistended. There were no palpable masses. There was no hepatosplenomegaly. There were normal bowel sounds in all quadrants. GU:  Rectal deferred.   MUSCULOSKELETAL:  Normal muscle strength and tone.  No clubbing or cyanosis.   SKIN:  There were no pathologic skin lesions.  There were no nodules on palpation. NEUROLOGIC:  Sensation is normal.  Cranial nerves are grossly intact. PSYCH:  Oriented to person, place and time.  Mood and affect are normal.  Data Reviewed Chest x-ray and CT scan  I have personally reviewed the patient's imaging, laboratory findings and medical records.    Assessment  I have independently reviewed the patient's CT scan and chest x-ray. There is a well formed nodule in the left upper lobe which is likely granulomatous or a benign lesion such as hamartoma. It may represent a carcinoid as well and for this reason additional follow-up is needed. She also has a nonspecific interstitial pneumonitis pattern which could be related to her hobby of bird keeping. 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    Plan    I did recommend to her that she discontinue taking care of the birds and that her son take care of those out of the house. I also recommended that she come back in 6 months to repeat the CT scan. She is scheduled to follow-up with Dr. Einar Pheasant regarding her nonspecific interstitial pneumonitis. If changing her environment does not help with her symptoms a consultation with our pulmonologist may be in order. 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 I will see her back again in 6 months time with a non-infused chest CT.       Nestor Lewandowsky, MD 02/23/2017, 3:46 PM

## 2017-02-23 NOTE — Telephone Encounter (Signed)
Left message for patient to return call back.  

## 2017-03-09 ENCOUNTER — Ambulatory Visit (INDEPENDENT_AMBULATORY_CARE_PROVIDER_SITE_OTHER): Payer: Medicare HMO | Admitting: Internal Medicine

## 2017-03-09 ENCOUNTER — Encounter: Payer: Self-pay | Admitting: Internal Medicine

## 2017-03-09 VITALS — BP 120/62 | HR 96 | Temp 98.4°F | Resp 12 | Ht 62.0 in | Wt 145.2 lb

## 2017-03-09 DIAGNOSIS — E2839 Other primary ovarian failure: Secondary | ICD-10-CM

## 2017-03-09 DIAGNOSIS — E119 Type 2 diabetes mellitus without complications: Secondary | ICD-10-CM

## 2017-03-09 DIAGNOSIS — R079 Chest pain, unspecified: Secondary | ICD-10-CM

## 2017-03-09 DIAGNOSIS — E039 Hypothyroidism, unspecified: Secondary | ICD-10-CM

## 2017-03-09 DIAGNOSIS — K219 Gastro-esophageal reflux disease without esophagitis: Secondary | ICD-10-CM

## 2017-03-09 DIAGNOSIS — F329 Major depressive disorder, single episode, unspecified: Secondary | ICD-10-CM

## 2017-03-09 DIAGNOSIS — R0602 Shortness of breath: Secondary | ICD-10-CM | POA: Diagnosis not present

## 2017-03-09 DIAGNOSIS — F32A Depression, unspecified: Secondary | ICD-10-CM

## 2017-03-09 DIAGNOSIS — M25569 Pain in unspecified knee: Secondary | ICD-10-CM | POA: Diagnosis not present

## 2017-03-09 DIAGNOSIS — I6529 Occlusion and stenosis of unspecified carotid artery: Secondary | ICD-10-CM

## 2017-03-09 DIAGNOSIS — E7800 Pure hypercholesterolemia, unspecified: Secondary | ICD-10-CM

## 2017-03-09 NOTE — Progress Notes (Signed)
Patient ID: Jody Wang, female   DOB: 11-05-1946, 71 y.o.   MRN: [id number]   Subjective:    Patient ID: Jody Wang, female    DOB: 1946/08/27, 71 y.o.   MRN: [id number]  HPI  Patient here for a scheduled follow up.  She reports she is feeling better.  Breathing is better.  Still some sob with exertion, but better.  No chest pain.  No acid reflux.  No abdominal pain.  Saw Dr Genevive Bi for abnormal chest CT.  Recommended to monitor.  Recommended f/u in 6 months.  Has f/u appt scheduled with [facility] next month.     Past Medical History:  Diagnosis Date  . Depression   . Diabetes mellitus without complication ([place])   . GERD (gastroesophageal reflux disease)   . Heart murmur    slight - followed by PCP  . Hypercholesterolemia   . Hypothyroidism   . Osteoarthritis of knee   . Wears dentures    partial lower   Past Surgical History:  Procedure Laterality Date  . ABDOMINAL HYSTERECTOMY  1985   ovaries not removed  . BREAST CYST EXCISION Right   . BREAST CYST EXCISION Left   . CATARACT EXTRACTION W/PHACO Right 02/16/2016   Procedure: CATARACT EXTRACTION PHACO AND INTRAOCULAR LENS PLACEMENT ([place]) RIGHT ;  Surgeon: Leandrew Koyanagi, MD;  Location: [place];  Service: Ophthalmology;  Laterality: Right;  DIABETIC - oral meds  . CATARACT EXTRACTION W/PHACO Left 03/15/2016   Procedure: CATARACT EXTRACTION PHACO AND INTRAOCULAR LENS PLACEMENT ([facility])left eye;  Surgeon: Leandrew Koyanagi, MD;  Location: [place];  Service: Ophthalmology;  Laterality: Left;  DIABETIC - oral meds  . INNER EAR SURGERY     Family History  Problem Relation Age of Onset  . Diabetes Father   . Coronary artery disease Father        s/p CABG  . Hypertension Father   . Hypercholesterolemia Father   . Hypertension Brother   . Hypertension Sister   . Glaucoma Sister   . Diabetes Unknown        niece  . Breast cancer Maternal Aunt   . Breast cancer Paternal Aunt   . Cervical cancer  Paternal Aunt   . Breast cancer Cousin   . Breast cancer Other    Social History   Social History  . Marital status: Widowed    Spouse name: N/A  . Number of children: 3  . Years of education: N/A   Social History Main Topics  . Smoking status: Former Smoker    Packs/day: 1.00    Years: 23.00    Quit date: 11/20/1983  . Smokeless tobacco: Never Used  . Alcohol use 0.0 oz/week     Comment: rare  . Drug use: No  . Sexual activity: No   Other Topics Concern  . None   Social History Narrative  . None    Outpatient Encounter Prescriptions as of 03/09/2017  Medication Sig  . albuterol (PROVENTIL HFA) 108 (90 Base) MCG/ACT inhaler Inhale 2 puffs into the lungs every 6 (six) hours as needed for wheezing or shortness of breath.  . diclofenac (VOLTAREN) 50 MG EC tablet Take 1 tablet (50 mg total) by mouth 2 (two) times daily as needed.  Marland Kitchen FLUoxetine (PROZAC) 20 MG capsule TAKE 1 CAPSULE EVERY DAY  . fluticasone (FLONASE) 50 MCG/ACT nasal spray PLACE 2 SPRAYS INTO BOTH NOSTRILS DAILY.  Marland Kitchen glipiZIDE (GLUCOTROL XL) 2.5 MG 24 hr tablet TAKE 1 TABLET  DAILY WITH BREAKFAST.  Marland Kitchen levocetirizine (XYZAL) 5 MG tablet Take 1 tablet (5 mg total) by mouth daily.  Marland Kitchen levothyroxine (SYNTHROID, LEVOTHROID) 88 MCG tablet TAKE 1 TABLET EVERY DAY  . metFORMIN (GLUCOPHAGE XR) 500 MG 24 hr tablet Take 2 tablets (1,000 mg total) by mouth 2 (two) times daily.  . pantoprazole (PROTONIX) 40 MG tablet Take 1 tablet (40 mg total) by mouth 2 (two) times daily before a meal.  . pravastatin (PRAVACHOL) 40 MG tablet TAKE 1/2 TABLET EVERY DAY  . traZODone (DESYREL) 50 MG tablet TAKE 1 TABLET EVERY DAY   Facility-Administered Encounter Medications as of 03/09/2017  Medication  . glucose blood test strip STRP 1 each    Review of Systems  Constitutional: Negative for appetite change and unexpected weight change.  HENT: Negative for congestion and sinus pressure.   Respiratory: Negative for cough, chest tightness and  shortness of breath.   Cardiovascular: Negative for chest pain, palpitations and leg swelling.  Gastrointestinal: Negative for abdominal pain, diarrhea, nausea and vomiting.  Genitourinary: Negative for difficulty urinating and dysuria.  Musculoskeletal: Negative for back pain.       Persistent knee pain.   Skin: Negative for color change and rash.  Neurological: Negative for dizziness, light-headedness and headaches.  Psychiatric/Behavioral: Negative for agitation and dysphoric mood.       Objective:    Physical Exam  Constitutional: She appears well-developed and well-nourished. No distress.  HENT:  Nose: Nose normal.  Mouth/Throat: Oropharynx is clear and moist.  Neck: Neck supple. No thyromegaly present.  Cardiovascular: Normal rate and regular rhythm.   Pulmonary/Chest: Breath sounds normal. No respiratory distress. She has no wheezes.  Abdominal: Soft. Bowel sounds are normal. There is no tenderness.  Musculoskeletal: She exhibits no edema or tenderness.  Lymphadenopathy:    She has no cervical adenopathy.  Skin: No rash noted. No erythema.  Psychiatric: She has a normal mood and affect. Her behavior is normal.    BP 120/62 (BP Location: Left Arm, Patient Position: Sitting, Cuff Size: Normal)   Pulse 96   Temp 98.4 F (36.9 C) (Oral)   Resp 12   Ht _0  (1.575 m)   Wt 145 lb 3.2 oz (65.9 kg)   SpO2 95%   BMI 26.56 kg/m  Wt Readings from Last 3 Encounters:  03/09/17 145 lb 3.2 oz (65.9 kg)  02/23/17 143 lb (64.9 kg)  02/05/17 144 lb 9.6 oz (65.6 kg)     Lab Results  Component Value Date   WBC 8.1 12/27/2016   HGB 12.1 12/27/2016   HCT 37.2 12/27/2016   PLT 388 01/19/2017   GLUCOSE 189 (H) 12/27/2016   CHOL 201 (H) 12/27/2016   TRIG 184.0 (H) 12/27/2016   HDL 43.10 12/27/2016   LDLDIRECT 188.8 06/30/2013   LDLCALC 121 (H) 12/27/2016   ALT 11 12/27/2016   AST 10 12/27/2016   NA 138 12/27/2016   K 4.9 12/27/2016   CL 103 12/27/2016   CREATININE 0.75  12/27/2016   BUN 10 12/27/2016   CO2 27 12/27/2016   TSH 1.96 12/27/2016   HGBA1C 7.5 (H) 12/27/2016   MICROALBUR 1.5 01/10/2016    Ct Chest Wo Contrast  Result Date: 02/12/2017 CLINICAL DATA:  71 year old female with history of sinus infection or allergies for the past several weeks. Mediastinal pain during that time. Abnormal chest x-ray concerning for potential left upper lobe pulmonary nodule. Followup study. EXAM: CT CHEST WITHOUT CONTRAST TECHNIQUE: Multidetector CT imaging of the chest was  performed following the standard protocol without IV contrast. COMPARISON:  No priors. FINDINGS: Cardiovascular: Heart size is normal. There is no significant pericardial fluid, thickening or pericardial calcification. There is aortic atherosclerosis, as well as atherosclerosis of the great vessels of the mediastinum and the coronary arteries, including calcified atherosclerotic plaque in the left main, left anterior descending, left circumflex and right coronary arteries. Mediastinum/Nodes: There multiple borderline enlarged and mildly enlarged mediastinal and bilateral hilar lymph nodes, largest of which measure up to 12 mm in short axis in the low right paratracheal nodal station. Esophagus is unremarkable in appearance. No axillary lymphadenopathy. Lungs/Pleura: Relatively diffuse but patchy areas of ground-glass attenuation and mild septal thickening are noted throughout all aspects of the lungs bilaterally. These findings have no definitive craniocaudal gradient. There is no associated honeycombing or traction bronchiectasis. Diffuse bronchial wall thickening with mild centrilobular emphysema. A few scattered pulmonary nodules are noted, including the largest which measures 6 mm in the left upper lobe (which corresponds to the perceived nodule on the recent chest radiograph). No confluent consolidative airspace disease. No pleural effusions. Upper Abdomen: Aortic atherosclerosis. Musculoskeletal: There are no  aggressive appearing lytic or blastic lesions noted in the visualized portions of the skeleton. IMPRESSION: 1. There are few scattered small pulmonary nodules, largest of which measures 6 mm in the left upper lobe (corresponding to the perceived abnormality on the recent chest radiograph). Non-contrast chest CT at 3-6 months is recommended. If the nodules are stable at time of repeat CT, then future CT at 18-24 months (from today's scan) is considered optional for low-risk patients, but is recommended for high-risk patients. This recommendation follows the consensus statement: Guidelines for Management of Incidental Pulmonary Nodules Detected on CT Images: From the Fleischner Society 2017; Radiology 2017; 284:228-243. 2. The appearance of the lungs suggests underlying interstitial lung disease, specifically, the pattern is concerning for nonspecific interstitial pneumonia (NSIP). At the time of the repeat chest CT in 3-6 months for nodule followup, please request a high-resolution chest CT protocol to be performed to better evaluate these findings and assess for temporal changes in the appearance of the lung parenchyma. 3. Multiple borderline enlarged and mildly enlarged mediastinal and bilateral hilar lymph nodes are nonspecific, and likely chronic in the setting of interstitial lung disease. Attention at time of follow-up imaging is recommended to ensure stability. 4. Aortic atherosclerosis, in addition to left main and 3 vessel coronary artery disease. Assessment for potential risk factor modification, dietary therapy or pharmacologic therapy may be warranted, if clinically indicated. Electronically Signed   By: Vinnie Langton M.D.   On: 02/12/2017 15:47       Assessment & Plan:   Problem List Items Addressed This Visit    Carotid stenosis    Being followed by vascular surgery.  Has f/u rescheduled to 04/2017.        Chest pain    Recently was evaluated for some chest discomfort.  See previous note.   Had EKG - SR with TWI in III and aVF.  She is feeling better.  No chest pain now.  Some sob with exertion, but breathing is better.    Will still have her see cardiology as we previously discussed, given EKG changes and sob with exertion -  for question of need for further w/up.        Depression    Doing well on prozac.        Diabetes mellitus ([place])    Low carb diet and exercise.  Follow met b and a1c.  Has started exercising more.        Relevant Orders   Hemoglobin U1T   Basic metabolic panel   Microalbumin / creatinine urine ratio   GERD (gastroesophageal reflux disease)    Controlled on protonix.        Hypercholesterolemia    Low cholesterol diet and exercise.  On pravastatin.  Follow lipid panel and liver function tests.        Relevant Orders   Hepatic function panel   Lipid panel   Hypothyroidism    On thyroid replacement.  Follow tsh.        Knee pain    Persistent knee pian.   On voltaren.  Discussed risk of antiinflammatories.  Follow.        SOB (shortness of breath) on exertion - Primary    Saw Dr Genevive Bi for abnormal chest CT.  Recommended following.  Recommended f/u in 6 months.  Breathing is better.  Still some sob with increased exertion.  Cardiology appt as outlined.        Relevant Orders   Ambulatory referral to Cardiology    Other Visit Diagnoses    Estrogen deficiency       Relevant Orders   DG Bone Density       Einar Pheasant, MD

## 2017-03-09 NOTE — Progress Notes (Signed)
Pre-visit discussion using our clinic review tool. No additional management support is needed unless otherwise documented below in the visit note.  

## 2017-03-18 ENCOUNTER — Encounter: Payer: Self-pay | Admitting: Internal Medicine

## 2017-03-18 NOTE — Assessment & Plan Note (Addendum)
Being followed by vascular surgery.  Has f/u rescheduled to 04/2017.

## 2017-03-18 NOTE — Assessment & Plan Note (Signed)
Low carb diet and exercise.  Follow met b and a1c.  Has started exercising more.

## 2017-03-18 NOTE — Assessment & Plan Note (Signed)
Persistent knee pian.   On voltaren.  Discussed risk of antiinflammatories.  Follow.

## 2017-03-18 NOTE — Assessment & Plan Note (Signed)
Doing well on prozac.   

## 2017-03-18 NOTE — Assessment & Plan Note (Signed)
On thyroid replacement.  Follow tsh.  

## 2017-03-18 NOTE — Assessment & Plan Note (Signed)
Low cholesterol diet and exercise.  On pravastatin.  Follow lipid panel and liver function tests.   

## 2017-03-18 NOTE — Assessment & Plan Note (Signed)
Controlled on protonix.   

## 2017-03-18 NOTE — Assessment & Plan Note (Addendum)
Recently was evaluated for some chest discomfort.  See previous note.  Had EKG - SR with TWI in III and aVF.  She is feeling better.  No chest pain now.  Some sob with exertion, but breathing is better.    Will still have her see cardiology as we previously discussed, given EKG changes and sob with exertion -  for question of need for further w/up.

## 2017-03-18 NOTE — Assessment & Plan Note (Addendum)
Saw Dr Genevive Bi for abnormal chest CT.  Recommended following.  Recommended f/u in 6 months.  Breathing is better.  Still some sob with increased exertion.  Cardiology appt as outlined.

## 2017-03-19 ENCOUNTER — Telehealth: Payer: Self-pay

## 2017-03-19 NOTE — Telephone Encounter (Signed)
Scheduled new patient appointment with son Jody Wang next available new patient appt on 5/14 and other appts in June for morning.  Refused these appt as patient works needs late afternoon and prefers thurs and fri  Scheduled  7/9 with gollan at 4

## 2017-04-12 ENCOUNTER — Other Ambulatory Visit: Payer: Self-pay | Admitting: Internal Medicine

## 2017-04-12 ENCOUNTER — Telehealth: Payer: Self-pay | Admitting: Internal Medicine

## 2017-04-12 ENCOUNTER — Other Ambulatory Visit: Payer: Self-pay

## 2017-04-12 MED ORDER — LEVOCETIRIZINE DIHYDROCHLORIDE 5 MG PO TABS: 5.0000 mg | ORAL_TABLET | Freq: Every day | ORAL | 1 refills | Status: DC

## 2017-04-12 NOTE — Telephone Encounter (Signed)
Pt called wanting to get a refill for levocetirizine (XYZAL) 5 MG tablet.  Pharmacy is [facility], [place]  Call pt @ [phone]. Thank you!

## 2017-04-12 NOTE — Telephone Encounter (Signed)
Done,

## 2017-04-13 ENCOUNTER — Ambulatory Visit (INDEPENDENT_AMBULATORY_CARE_PROVIDER_SITE_OTHER): Payer: Medicare HMO

## 2017-04-13 ENCOUNTER — Encounter (INDEPENDENT_AMBULATORY_CARE_PROVIDER_SITE_OTHER): Payer: Self-pay | Admitting: Vascular Surgery

## 2017-04-13 ENCOUNTER — Ambulatory Visit (INDEPENDENT_AMBULATORY_CARE_PROVIDER_SITE_OTHER): Payer: Medicare HMO | Admitting: Vascular Surgery

## 2017-04-13 VITALS — BP 137/67 | HR 80 | Resp 14 | Ht 61.0 in | Wt 141.0 lb

## 2017-04-13 DIAGNOSIS — I6529 Occlusion and stenosis of unspecified carotid artery: Secondary | ICD-10-CM | POA: Diagnosis not present

## 2017-04-13 DIAGNOSIS — E7800 Pure hypercholesterolemia, unspecified: Secondary | ICD-10-CM | POA: Diagnosis not present

## 2017-04-13 DIAGNOSIS — E119 Type 2 diabetes mellitus without complications: Secondary | ICD-10-CM

## 2017-04-13 DIAGNOSIS — I6523 Occlusion and stenosis of bilateral carotid arteries: Secondary | ICD-10-CM | POA: Diagnosis not present

## 2017-04-13 LAB — VAS US CAROTID
LCCADDIAS: -30 cm/s
LCCADSYS: -288 cm/s
LCCAPDIAS: 8 cm/s
LEFT ECA DIAS: -7 cm/s
LEFT VERTEBRAL DIAS: 4 cm/s
LICADDIAS: -7 cm/s
LICADSYS: -51 cm/s
LICAPDIAS: -18 cm/s
Left CCA prox sys: 67 cm/s
Left ICA prox sys: -130 cm/s
RCCAPDIAS: 15 cm/s
RCCAPSYS: 98 cm/s
RIGHT CCA MID DIAS: 18 cm/s
RIGHT ECA DIAS: 8 cm/s
RIGHT VERTEBRAL DIAS: 13 cm/s
Right cca dist sys: -84 cm/s

## 2017-04-13 NOTE — Assessment & Plan Note (Signed)
Her carotid duplex today does show some progression of her left carotid artery now following in the 60-79% range her last study. Her right carotid artery stenosis is stable in the 40-59% range. With the progression, we will shorten her follow-up interval to every 6 months. She should continue an aspirin daily and her pravastatin. She will contact our office with any problems in the interim.

## 2017-04-13 NOTE — Assessment & Plan Note (Signed)
lipid control important in reducing the progression of atherosclerotic disease. Continue statin therapy  

## 2017-04-13 NOTE — Assessment & Plan Note (Signed)
blood glucose control important in reducing the progression of atherosclerotic disease. Also, involved in wound healing. On appropriate medications.  

## 2017-04-13 NOTE — Progress Notes (Signed)
MRN : [id number]  Jody Wang is a 71 y.o. ([birth year]) female who presents with chief complaint of  Chief Complaint  Patient presents with  . Carotid    1 year evaluation  .  History of Present Illness: Patient returns in follow-up of her carotid disease. She is doing well and denies any symptoms since her last visit. She denies any focal neurologic symptoms. Specifically, the patient denies amaurosis fugax, speech or swallowing difficulties, or arm or leg weakness or numbness. Her carotid duplex today does show some progression of her left carotid artery now following in the 60-79% range her last study. Her right carotid artery stenosis is stable in the 40-59% range.  Current Outpatient Prescriptions  Medication Sig Dispense Refill  . albuterol (PROVENTIL HFA) 108 (90 Base) MCG/ACT inhaler Inhale 2 puffs into the lungs every 6 (six) hours as needed for wheezing or shortness of breath. 1 Inhaler 1  . diclofenac (VOLTAREN) 50 MG EC tablet Take 1 tablet (50 mg total) by mouth 2 (two) times daily as needed. 180 tablet 0  . FLUoxetine (PROZAC) 20 MG capsule TAKE 1 CAPSULE EVERY DAY 90 capsule 1  . fluticasone (FLONASE) 50 MCG/ACT nasal spray PLACE 2 SPRAYS INTO BOTH NOSTRILS DAILY. 48 g 3  . glipiZIDE (GLUCOTROL XL) 2.5 MG 24 hr tablet TAKE 1 TABLET  DAILY WITH BREAKFAST. 90 tablet 1  . levocetirizine (XYZAL) 5 MG tablet Take 1 tablet (5 mg total) by mouth daily. 90 tablet 1  . levothyroxine (SYNTHROID, LEVOTHROID) 88 MCG tablet TAKE 1 TABLET EVERY DAY 90 tablet 2  . metFORMIN (GLUCOPHAGE XR) 500 MG 24 hr tablet Take 2 tablets (1,000 mg total) by mouth 2 (two) times daily. 360 tablet 1  . pantoprazole (PROTONIX) 40 MG tablet Take 1 tablet (40 mg total) by mouth 2 (two) times daily before a meal. 180 tablet 1  . pravastatin (PRAVACHOL) 40 MG tablet TAKE 1/2 TABLET EVERY DAY 45 tablet 3  . traZODone (DESYREL) 50 MG tablet TAKE 1 TABLET EVERY DAY 90 tablet 1   Current  Facility-Administered Medications  Medication Dose Route Frequency Provider Last Rate Last Dose  . glucose blood test strip STRP 1 each  1 each Other PRN Einar Pheasant, MD        Past Medical History:  Diagnosis Date  . Depression   . Diabetes mellitus without complication ([place])   . GERD (gastroesophageal reflux disease)   . Heart murmur    slight - followed by PCP  . Hypercholesterolemia   . Hypothyroidism   . Osteoarthritis of knee   . Wears dentures    partial lower    Past Surgical History:  Procedure Laterality Date  . ABDOMINAL HYSTERECTOMY  1985   ovaries not removed  . BREAST CYST EXCISION Right   . BREAST CYST EXCISION Left   . CATARACT EXTRACTION W/PHACO Right 02/16/2016   Procedure: CATARACT EXTRACTION PHACO AND INTRAOCULAR LENS PLACEMENT ([place]) RIGHT ;  Surgeon: Leandrew Koyanagi, MD;  Location: [place];  Service: Ophthalmology;  Laterality: Right;  DIABETIC - oral meds  . CATARACT EXTRACTION W/PHACO Left 03/15/2016   Procedure: CATARACT EXTRACTION PHACO AND INTRAOCULAR LENS PLACEMENT ([facility])left eye;  Surgeon: Leandrew Koyanagi, MD;  Location: [place];  Service: Ophthalmology;  Laterality: Left;  DIABETIC - oral meds  . INNER EAR SURGERY      Social History Social History  Substance Use Topics  . Smoking status: Former Smoker    Packs/day: 1.00  Years: 23.00    Quit date: 11/20/1983  . Smokeless tobacco: Never Used  . Alcohol use 0.0 oz/week     Comment: rare    Family History Family History  Problem Relation Age of Onset  . Diabetes Father   . Coronary artery disease Father        s/p CABG  . Hypertension Father   . Hypercholesterolemia Father   . Hypertension Brother   . Hypertension Sister   . Glaucoma Sister   . Diabetes Unknown        niece  . Breast cancer Maternal Aunt   . Breast cancer Paternal Aunt   . Cervical cancer Paternal Aunt   . Breast cancer Cousin   . Breast cancer Other     Allergies  Allergen  Reactions  . Codeine Shortness Of Breath    stops breathing  . Meperidine And Related Shortness Of Breath    Stops breathing  . Oxycontin [Oxycodone Hcl] Shortness Of Breath    Stops breathing  . Zetia [Ezetimibe]     Doesn't remember reaction  . Tape Rash    Paper tape ok     REVIEW OF SYSTEMS (Negative unless checked)  Constitutional: [] Weight loss  [] Fever  [] Chills Cardiac: [] Chest pain   [] Chest pressure   [] Palpitations   [] Shortness of breath when laying flat   [] Shortness of breath at rest   [] Shortness of breath with exertion. Vascular:  [] Pain in legs with walking   [] Pain in legs at rest   [] Pain in legs when laying flat   [] Claudication   [] Pain in feet when walking  [] Pain in feet at rest  [] Pain in feet when laying flat   [] History of DVT   [] Phlebitis   [] Swelling in legs   [] Varicose veins   [] Non-healing ulcers Pulmonary:   [] Uses home oxygen   [] Productive cough   [] Hemoptysis   [] Wheeze  [] COPD   [] Asthma Neurologic:  [] Dizziness  [] Blackouts   [] Seizures   [] History of stroke   [] History of TIA  [] Aphasia   [] Temporary blindness   [] Dysphagia   [] Weakness or numbness in arms   [] Weakness or numbness in legs Musculoskeletal:  [x] Arthritis   [] Joint swelling   [] Joint pain   [] Low back pain Hematologic:  [] Easy bruising  [] Easy bleeding   [] Hypercoagulable state   [] Anemic  [] Hepatitis Gastrointestinal:  [] Blood in stool   [] Vomiting blood  [x] Gastroesophageal reflux/heartburn   [] Difficulty swallowing. Genitourinary:  [] Chronic kidney disease   [] Difficult urination  [] Frequent urination  [] Burning with urination   [] Blood in urine Skin:  [] Rashes   [] Ulcers   [] Wounds Psychological:  [] History of anxiety   [x]  History of major depression.  Physical Examination  Vitals:   04/13/17 1201 04/13/17 1202  BP: 120/63 137/67  Pulse: 80 80  Resp: 14   Weight: 141 lb (64 kg)   Height: 5\' 1"  (1.549 m)    Body mass index is 26.64 kg/m. Gen:  WD/WN, NAD. Appears younger  than stated age Head: [place]/AT, No temporalis wasting. Ear/Nose/Throat: Hearing grossly intact, nares w/o erythema or drainage, trachea midline Eyes: Conjunctiva clear. Sclera non-icteric Neck: Supple.  No JVD.  Pulmonary:  Good air movement, equal and clear to auscultation bilaterally.  Cardiac: RRR, normal S1, S2 Vascular:  Vessel Right Left  Radial Palpable Palpable  Ulnar Palpable Palpable  Brachial Palpable Palpable  Carotid Palpable, without bruit Palpable, with bruit  Aorta Not palpable N/A  Femoral Palpable Palpable  Popliteal Palpable Palpable  PT Palpable Palpable  DP Palpable Palpable   Gastrointestinal: soft, non-tender/non-distended.  Musculoskeletal: M/S 5/5 throughout.  No deformity or atrophy.  Neurologic: CN 2-12 intact. Sensation grossly intact in extremities.  Symmetrical.  Speech is fluent. Motor exam as listed above. Psychiatric: Judgment intact, Mood & affect appropriate for pt's clinical situation. Dermatologic: No rashes or ulcers noted.  No cellulitis or open wounds.      CBC Lab Results  Component Value Date   WBC 8.1 12/27/2016   HGB 12.1 12/27/2016   HCT 37.2 12/27/2016   MCV 82.1 12/27/2016   PLT 388 01/19/2017    BMET    Component Value Date/Time   NA 138 12/27/2016 1002   K 4.9 12/27/2016 1002   CL 103 12/27/2016 1002   CO2 27 12/27/2016 1002   GLUCOSE 189 (H) 12/27/2016 1002   BUN 10 12/27/2016 1002   CREATININE 0.75 12/27/2016 1002   CREATININE 0.71 11/05/2014 1237   CALCIUM 9.5 12/27/2016 1002   CrCl cannot be calculated (Patient's most recent lab result is older than the maximum 21 days allowed.).  COAG No results found for: INR, PROTIME  Radiology No results found.   Assessment/Plan Diabetes mellitus blood glucose control important in reducing the progression of atherosclerotic disease. Also, involved in wound healing. On appropriate medications.   Hypercholesterolemia lipid control important in reducing the  progression of atherosclerotic disease. Continue statin therapy   Carotid stenosis Her carotid duplex today does show some progression of her left carotid artery now following in the 60-79% range her last study. Her right carotid artery stenosis is stable in the 40-59% range. With the progression, we will shorten her follow-up interval to every 6 months. She should continue an aspirin daily and her pravastatin. She will contact our office with any problems in the interim.    Leotis Pain, MD  04/13/2017 1:06 PM    This note was created with Dragon medical transcription system.  Any errors from dictation are purely unintentional

## 2017-04-16 ENCOUNTER — Telehealth: Payer: Self-pay | Admitting: Internal Medicine

## 2017-04-16 NOTE — Telephone Encounter (Signed)
Pt was scheduled for her Dexa scan for 05/03/2017. Pt called and cancelled appt stating she does not want the appt. Thank you!

## 2017-05-03 ENCOUNTER — Ambulatory Visit: Payer: Medicare HMO

## 2017-05-12 NOTE — Progress Notes (Signed)
The Cardiology Office Note  Date:  05/14/2017   ID:  Jody Wang, DOB 08-Mar-1946, MRN [id number]  PCP:  Jody Wang   Chief Complaint  Patient presents with  . other    New patient. Patient deneis chest pain and SOB. Meds reviewed verbally with patient.     HPI:  Jody Wang is a pleasant 71 year old woman with history of Smoking, 23 years, quit in 1985 Diabetes Hyperlipidemia Carotid stenosis,  60-79% range on the left right carotid artery stenosis 40-59% range. Previously seen by Jody Wang for abnormal chest CT,  lung nodule Who presents by referral from Jody Wang for evaluation/consultation of her shortness of breath and chest tightness  She reports that several months ago she had bronchitis This caused significant shortness of breath and cough She feels the cough and bronchitis causing some chest tightness Now that her bronchitis has resolved, she has had no further episodes of chest pain Reports that she is very active currently with no reproducible symptoms concerning for angina  Last weekend reports that she cleaned her whole house, heavy work, had no symptoms of chest discomfort  Still coughing up little bits of yellow and green in the mornings but overall better  If she walks long distances he does have SOB on exertion, feels this has been a chronic issue Chronic Knee pain, Walks the dog, but limited by her knees  CT scan chest April 2018 pulled up in the office, Images reviewed with her in detail from the carotids down to the top of the kidneys Aortic atherosclerosis in the aorta and coronary arteries shown to her in detail  EKG personally reviewed by myself on todays visit Shows normal sinus rhythm with rate 71 bpm no significant ST or T-wave changes   PMH:   has a past medical history of Depression; Diabetes mellitus without complication ([place]); GERD (gastroesophageal reflux disease); Heart murmur; Hypercholesterolemia; Hypothyroidism;  Osteoarthritis of knee; and Wears dentures.  PSH:    Past Surgical History:  Procedure Laterality Date  . ABDOMINAL HYSTERECTOMY  1985   ovaries not removed  . BREAST CYST EXCISION Right   . BREAST CYST EXCISION Left   . CATARACT EXTRACTION W/PHACO Right 02/16/2016   Procedure: CATARACT EXTRACTION PHACO AND INTRAOCULAR LENS PLACEMENT ([place]) RIGHT ;  Surgeon: Jody Koyanagi, Wang;  Location: [facility];  Service: Ophthalmology;  Laterality: Right;  DIABETIC - oral meds  . CATARACT EXTRACTION W/PHACO Left 03/15/2016   Procedure: CATARACT EXTRACTION PHACO AND INTRAOCULAR LENS PLACEMENT (I[facility])left eye;  Surgeon: Jody Koyanagi, Wang;  Location: [facility];  Service: Ophthalmology;  Laterality: Left;  DIABETIC - oral meds  . INNER EAR SURGERY      Current Outpatient Prescriptions  Medication Sig Dispense Refill  . albuterol (PROVENTIL HFA) 108 (90 Base) MCG/ACT inhaler Inhale 2 puffs into the lungs every 6 (six) hours as needed for wheezing or shortness of breath. 1 Inhaler 1  . diclofenac (VOLTAREN) 50 MG EC tablet Take 1 tablet (50 mg total) by mouth 2 (two) times daily as needed. 180 tablet 0  . FLUoxetine (PROZAC) 20 MG capsule TAKE 1 CAPSULE EVERY DAY 90 capsule 1  . fluticasone (FLONASE) 50 MCG/ACT nasal spray PLACE 2 SPRAYS INTO BOTH NOSTRILS DAILY. 48 g 3  . glipiZIDE (GLUCOTROL XL) 2.5 MG 24 hr tablet TAKE 1 TABLET  DAILY WITH BREAKFAST. 90 tablet 1  . levocetirizine (XYZAL) 5 MG tablet Take 1 tablet (5 mg total) by mouth daily. 90 tablet 1  .  levothyroxine (SYNTHROID, LEVOTHROID) 88 MCG tablet TAKE 1 TABLET EVERY DAY 90 tablet 2  . metFORMIN (GLUCOPHAGE XR) 500 MG 24 hr tablet Take 2 tablets (1,000 mg total) by mouth 2 (two) times daily. 360 tablet 1  . pantoprazole (PROTONIX) 40 MG tablet Take 1 tablet (40 mg total) by mouth 2 (two) times daily before a meal. 180 tablet 1  . traZODone (DESYREL) 50 MG tablet TAKE 1 TABLET EVERY DAY 90 tablet 1  . rosuvastatin  (CRESTOR) 40 MG tablet Take 1 tablet (40 mg total) by mouth daily. 90 tablet 3   Current Facility-Administered Medications  Medication Dose Route Frequency Provider Last Rate Last Dose  . glucose blood test strip STRP 1 each  1 each Other PRN Jody Wang         Allergies:   Codeine; Meperidine and related; Oxycontin [oxycodone hcl]; Zetia [ezetimibe]; and Tape   Social History:  The patient  reports that she quit smoking about 33 years ago. She has a 23.00 pack-year smoking history. She has never used smokeless tobacco. She reports that she drinks alcohol. She reports that she does not use drugs.   Family History:   family history includes Breast cancer in her cousin, maternal aunt, other, and paternal aunt; Cervical cancer in her paternal aunt; Coronary artery disease in her father; Diabetes in her father; Glaucoma in her sister; Hypercholesterolemia in her father; Hypertension in her brother, father, and sister.    Review of Systems: Review of Systems  Constitutional: Negative.   Respiratory: Positive for shortness of breath.   Cardiovascular: Negative.   Gastrointestinal: Negative.   Musculoskeletal: Negative.   Neurological: Negative.   Psychiatric/Behavioral: Negative.   All other systems reviewed and are negative.    PHYSICAL EXAM: VS:  BP 130/60 (BP Location: Right Arm, Patient Position: Sitting, Cuff Size: Normal)   Pulse 93   Ht 5\' 1"  (1.549 m)   Wt 146 lb (66.2 kg)   BMI 27.59 kg/m  , BMI Body mass index is 27.59 kg/m. GEN: Well nourished, well developed, in no acute distress  HEENT: normal  Neck: no JVD, carotid bruits, or masses Cardiac: RRR; no murmurs, rubs, or gallops,no edema  Respiratory:  clear to auscultation bilaterally, normal work of breathing GI: soft, nontender, nondistended, + BS MS: no deformity or atrophy  Skin: warm and dry, no rash Neuro:  Strength and sensation are intact Psych: euthymic mood, full affect    Recent  Labs: 12/27/2016: ALT 11; BUN 10; Creatinine, Ser 0.75; Hemoglobin 12.1; Potassium 4.9; Sodium 138; TSH 1.96 01/19/2017: Platelets 388    Lipid Panel Lab Results  Component Value Date   CHOL 201 (H) 12/27/2016   HDL 43.10 12/27/2016   LDLCALC 121 (H) 12/27/2016   TRIG 184.0 (H) 12/27/2016      Wt Readings from Last 3 Encounters:  05/14/17 146 lb (66.2 kg)  04/13/17 141 lb (64 kg)  03/09/17 145 lb 3.2 oz (65.9 kg)       ASSESSMENT AND PLAN:  SOB (shortness of breath) on exertion - Plan: EKG 12-Lead Likely multifactorial including deconditioning, COPD Unable to exclude ischemia If symptoms get worse she will call us for stress testing  Chest pain, unspecified type - Plan: EKG 12-Lead Reports having remote chest pain in the setting of bronchitis, none recently. We discussed symptoms of angina and recommended she call our office if she has any chest tightness on exertion or worsening shortness of breath  Type 2 diabetes mellitus with other circulatory  complication, without long-term current use of insulin ([place]) - Plan: EKG 12-Lead Hemoglobin A1c greater than 7 We have encouraged continued exercise, careful diet management in an effort to lose weight.  Bilateral carotid artery stenosis - Plan: EKG 12-Lead Followed by Jody. [facility] Changing cholesterol medication, goal LDL less than 70 to limit progression  Hypercholesterolemia - Plan: EKG 12-Lead Recommended she stop her pravastatin, start Crestor 40 mg daily Goal LDL less than 70  Coronary artery disease of native artery of native heart with stable angina pectoris ([place]) Diffuse three-vessel coronary calcification seen on CT scan chest She denies any chest pain concerning for angina As she is asymptomatic, she does not feel that she needs any further testing We did discuss that shortness of breath can be a symptom of angina and recommended she call our office if symptoms get worse.  Aortic atherosclerosis ([place]) seen on CT scan  chest April 2018 Moderate diffuse disease Images show during detail, recommended aggressive cholesterol regiment Goal LDL less than 70   Total encounter time more than  60 minutes  Greater than 50% was spent in counseling and coordination of care with the patient    Disposition:   F/U  6 months   Orders Placed This Encounter  Procedures  . EKG 12-Lead     Signed, Esmond Plants, M.D., Ph.D. 05/14/2017  [place], [place]

## 2017-05-14 ENCOUNTER — Encounter: Payer: Self-pay | Admitting: Cardiovascular Disease

## 2017-05-14 ENCOUNTER — Ambulatory Visit (INDEPENDENT_AMBULATORY_CARE_PROVIDER_SITE_OTHER): Payer: Medicare HMO | Admitting: Cardiovascular Disease

## 2017-05-14 VITALS — BP 130/60 | HR 93 | Ht 61.0 in | Wt 146.0 lb

## 2017-05-14 DIAGNOSIS — I700 Atherosclerosis of aorta: Secondary | ICD-10-CM | POA: Diagnosis not present

## 2017-05-14 DIAGNOSIS — I6523 Occlusion and stenosis of bilateral carotid arteries: Secondary | ICD-10-CM | POA: Diagnosis not present

## 2017-05-14 DIAGNOSIS — E1159 Type 2 diabetes mellitus with other circulatory complications: Secondary | ICD-10-CM | POA: Diagnosis not present

## 2017-05-14 DIAGNOSIS — R079 Chest pain, unspecified: Secondary | ICD-10-CM | POA: Diagnosis not present

## 2017-05-14 DIAGNOSIS — R0602 Shortness of breath: Secondary | ICD-10-CM | POA: Diagnosis not present

## 2017-05-14 DIAGNOSIS — I25118 Atherosclerotic heart disease of native coronary artery with other forms of angina pectoris: Secondary | ICD-10-CM | POA: Diagnosis not present

## 2017-05-14 DIAGNOSIS — E7800 Pure hypercholesterolemia, unspecified: Secondary | ICD-10-CM

## 2017-05-14 MED ORDER — ROSUVASTATIN CALCIUM 40 MG PO TABS: 40.0000 mg | ORAL_TABLET | Freq: Every day | ORAL | 3 refills | Status: DC

## 2017-05-14 NOTE — Patient Instructions (Addendum)
Medication Instructions:   Please stop the pravastatin  Start crestor 40  Mg one a day   Labwork:  No new labs needed  Testing/Procedures:  No further testing at this time   Follow-Up: It was a pleasure seeing you in the office today. Please call us if you have new issues that need to be addressed before your next appt.  [phone]  Your physician wants you to follow-up in: 6 months.  You will receive a reminder letter in the mail two months in advance. If you don't receive a letter, please call our office to schedule the follow-up appointment.  If you need a refill on your cardiac medications before your next appointment, please call your pharmacy.

## 2017-05-17 ENCOUNTER — Other Ambulatory Visit: Payer: Medicare HMO

## 2017-05-18 ENCOUNTER — Ambulatory Visit: Payer: Medicare HMO | Admitting: Internal Medicine

## 2017-05-24 DIAGNOSIS — H524 Presbyopia: Secondary | ICD-10-CM | POA: Diagnosis not present

## 2017-07-12 ENCOUNTER — Other Ambulatory Visit: Payer: Self-pay | Admitting: Internal Medicine

## 2017-07-13 NOTE — Telephone Encounter (Signed)
Last OV was 03/09/17, Please advise for all the refills, xanax, not on list, thanks

## 2017-07-16 NOTE — Telephone Encounter (Signed)
She is overdue labs.  Need to schedule fasting lab appt soon.  Also will need a f/u appt scheduled.  Once labs scheduled, will refill medication x 1.

## 2017-07-17 NOTE — Telephone Encounter (Signed)
Called patient l/m to call office  

## 2017-07-19 NOTE — Telephone Encounter (Signed)
ok'd rx x 1 (90 day).  Signed alprazolam rx and placed in box.

## 2017-07-19 NOTE — Telephone Encounter (Signed)
Called patient lab app was made for 07/25/17 am not able to get her in for follow up until 11/19. It has to be 3 pm or after for patient so that was the first thing that we had. Am I ok to still fill 1 time

## 2017-07-25 ENCOUNTER — Other Ambulatory Visit (INDEPENDENT_AMBULATORY_CARE_PROVIDER_SITE_OTHER): Payer: Medicare HMO

## 2017-07-25 DIAGNOSIS — E7800 Pure hypercholesterolemia, unspecified: Secondary | ICD-10-CM | POA: Diagnosis not present

## 2017-07-25 DIAGNOSIS — E119 Type 2 diabetes mellitus without complications: Secondary | ICD-10-CM | POA: Diagnosis not present

## 2017-07-25 LAB — HEPATIC FUNCTION PANEL
ALBUMIN: 4.3 g/dL (ref 3.5–5.2)
ALT: 13 U/L (ref 0–35)
AST: 13 U/L (ref 0–37)
Alkaline Phosphatase: 58 U/L (ref 39–117)
BILIRUBIN DIRECT: 0 mg/dL (ref 0.0–0.3)
TOTAL PROTEIN: 7.7 g/dL (ref 6.0–8.3)
Total Bilirubin: 0.3 mg/dL (ref 0.2–1.2)

## 2017-07-25 LAB — LIPID PANEL
Cholesterol: 125 mg/dL (ref 0–200)
HDL: 57.1 mg/dL
LDL Cholesterol: 44 mg/dL (ref 0–99)
NonHDL: 67.92
Total CHOL/HDL Ratio: 2
Triglycerides: 120 mg/dL (ref 0.0–149.0)
VLDL: 24 mg/dL (ref 0.0–40.0)

## 2017-07-25 LAB — HEMOGLOBIN A1C: Hgb A1c MFr Bld: 7.7 % — ABNORMAL HIGH (ref 4.6–6.5)

## 2017-07-25 LAB — BASIC METABOLIC PANEL
BUN: 16 mg/dL (ref 6–23)
CALCIUM: 9.2 mg/dL (ref 8.4–10.5)
CHLORIDE: 101 meq/L (ref 96–112)
CO2: 27 mEq/L (ref 19–32)
CREATININE: 0.82 mg/dL (ref 0.40–1.20)
GFR: 72.88 mL/min (ref >60.00)
Glucose, Bld: 176 mg/dL — ABNORMAL HIGH (ref 70–99)
Potassium: 4.4 mEq/L (ref 3.5–5.1)
Sodium: 138 mEq/L (ref 135–145)

## 2017-07-25 NOTE — Addendum Note (Signed)
Addended by: Leeanne Rio on: 07/25/2017 10:45 AM   Modules accepted: Orders

## 2017-08-17 ENCOUNTER — Telehealth: Payer: Self-pay

## 2017-08-17 NOTE — Telephone Encounter (Signed)
Patient sent in BP readings. Put in your blue folder.

## 2017-08-17 NOTE — Telephone Encounter (Signed)
Reviewed sugars.  AM sugars still elevated, but overall readings appear to be better than last a1c average.  Continue to follow sugars and send in readings.  Keep f/u appt.

## 2017-08-20 NOTE — Telephone Encounter (Signed)
Left message to return call to our office.  

## 2017-08-21 ENCOUNTER — Telehealth: Payer: Self-pay

## 2017-08-21 DIAGNOSIS — R911 Solitary pulmonary nodule: Secondary | ICD-10-CM

## 2017-08-21 NOTE — Telephone Encounter (Signed)
Called patient gave all information she will call if any questions.

## 2017-08-21 NOTE — Telephone Encounter (Signed)
CT Scan was ordered and patient was informed to go to the appointment and then follow up with Dr. Genevive Bi.

## 2017-08-22 NOTE — Telephone Encounter (Signed)
Called patient to let her know that her CT Scan and Follow up appointment was scheduled and mailed.

## 2017-08-23 ENCOUNTER — Telehealth: Payer: Self-pay

## 2017-08-23 NOTE — Telephone Encounter (Signed)
Called patient to make sure that she had heard the message I had left on her voicemail two days ago. She stated that she had not. I then told her that the reason that I was calling her was to remind her that she needed to have a CT Scan and follow up with Dr. Genevive Bi in reference to her Lung nodules. However, patient stated that she had been seeing Dr. Rockey Situ (cardiologist) and that he told her that she did not need to see Dr. Genevive Bi again because he would follow her. I asked her if that was correct and she stated that it was. I told patient that if Dr. Rockey Situ wanted Dr. Genevive Bi to follow her care again that she had our number to contact us. Patient agreed.

## 2017-08-27 ENCOUNTER — Ambulatory Visit: Payer: Medicare HMO

## 2017-08-28 ENCOUNTER — Ambulatory Visit: Payer: Self-pay | Admitting: Cardiothoracic Surgery

## 2017-08-31 ENCOUNTER — Other Ambulatory Visit: Payer: Self-pay | Admitting: Internal Medicine

## 2017-08-31 DIAGNOSIS — Z1231 Encounter for screening mammogram for malignant neoplasm of breast: Secondary | ICD-10-CM

## 2017-09-24 ENCOUNTER — Ambulatory Visit (INDEPENDENT_AMBULATORY_CARE_PROVIDER_SITE_OTHER): Payer: Medicare HMO | Admitting: Internal Medicine

## 2017-09-24 ENCOUNTER — Encounter: Payer: Self-pay | Admitting: Internal Medicine

## 2017-09-24 VITALS — BP 128/60 | HR 90 | Temp 98.6°F | Resp 14 | Wt 147.8 lb

## 2017-09-24 DIAGNOSIS — F329 Major depressive disorder, single episode, unspecified: Secondary | ICD-10-CM | POA: Diagnosis not present

## 2017-09-24 DIAGNOSIS — R9389 Abnormal findings on diagnostic imaging of other specified body structures: Secondary | ICD-10-CM

## 2017-09-24 DIAGNOSIS — E7800 Pure hypercholesterolemia, unspecified: Secondary | ICD-10-CM

## 2017-09-24 DIAGNOSIS — I700 Atherosclerosis of aorta: Secondary | ICD-10-CM

## 2017-09-24 DIAGNOSIS — D473 Essential (hemorrhagic) thrombocythemia: Secondary | ICD-10-CM | POA: Diagnosis not present

## 2017-09-24 DIAGNOSIS — D75839 Thrombocytosis, unspecified: Secondary | ICD-10-CM

## 2017-09-24 DIAGNOSIS — E1159 Type 2 diabetes mellitus with other circulatory complications: Secondary | ICD-10-CM | POA: Diagnosis not present

## 2017-09-24 DIAGNOSIS — K219 Gastro-esophageal reflux disease without esophagitis: Secondary | ICD-10-CM | POA: Diagnosis not present

## 2017-09-24 DIAGNOSIS — F32A Depression, unspecified: Secondary | ICD-10-CM

## 2017-09-24 DIAGNOSIS — E039 Hypothyroidism, unspecified: Secondary | ICD-10-CM

## 2017-09-24 MED ORDER — FLUOXETINE HCL 20 MG PO CAPS: 20.0000 mg | ORAL_CAPSULE | Freq: Every day | ORAL | 1 refills | Status: DC

## 2017-09-24 MED ORDER — DICLOFENAC SODIUM 50 MG PO TBEC: 50.0000 mg | DELAYED_RELEASE_TABLET | Freq: Two times a day (BID) | ORAL | 0 refills | Status: DC | PRN

## 2017-09-24 MED ORDER — GLIPIZIDE ER 5 MG PO TB24: 5.0000 mg | ORAL_TABLET | Freq: Every day | ORAL | 1 refills | Status: DC

## 2017-09-24 MED ORDER — LEVOCETIRIZINE DIHYDROCHLORIDE 5 MG PO TABS: 5.0000 mg | ORAL_TABLET | Freq: Every day | ORAL | 1 refills | Status: DC

## 2017-09-24 MED ORDER — ALPRAZOLAM 0.25 MG PO TABS: 0.2500 mg | ORAL_TABLET | Freq: Every evening | ORAL | 0 refills | Status: DC | PRN

## 2017-09-24 MED ORDER — PANTOPRAZOLE SODIUM 40 MG PO TBEC: DELAYED_RELEASE_TABLET | ORAL | 1 refills | Status: DC

## 2017-09-24 MED ORDER — LEVOTHYROXINE SODIUM 88 MCG PO TABS: 88.0000 ug | ORAL_TABLET | Freq: Every day | ORAL | 1 refills | Status: DC

## 2017-09-24 MED ORDER — TRAZODONE HCL 50 MG PO TABS: 50.0000 mg | ORAL_TABLET | Freq: Every day | ORAL | 0 refills | Status: DC

## 2017-09-24 NOTE — Progress Notes (Signed)
cyano

## 2017-09-24 NOTE — Progress Notes (Signed)
Patient ID: Jody Wang, female   DOB: [birth year], 71 y.o.   MRN: [id number]   Subjective:    Patient ID: Jody Wang, female    DOB: 09/30/1946, 71 y.o.   MRN: [id number]  HPI  Patient here for a scheduled follow up.  She reports she is doing well.  Brings in sugar readings.  Still elevated.  Discussed medication adjustment.  Cost is an issue.  Tolerating glucotrol.  Discussed increased dose.  No low sugars.  Trying to stay active.  Walking her dog.  No chest pain.  No sob.  No acid reflux.  No abdominal pain.  Bowels moving.  Discussed her diet.  She does not eat until around 3:00.  Eats at 7:00 and then at 10:00.  Discussed the need to eat more regular meals.  Handling stress.     Past Medical History:  Diagnosis Date  . Depression   . Diabetes mellitus without complication ([place])   . GERD (gastroesophageal reflux disease)   . Heart murmur    slight - followed by PCP  . Hypercholesterolemia   . Hypothyroidism   . Osteoarthritis of knee   . Wears dentures    partial lower   Past Surgical History:  Procedure Laterality Date  . ABDOMINAL HYSTERECTOMY  1985   ovaries not removed  . BREAST CYST EXCISION Right   . BREAST CYST EXCISION Left   . CATARACT EXTRACTION W/PHACO Right 02/16/2016   Procedure: CATARACT EXTRACTION PHACO AND INTRAOCULAR LENS PLACEMENT ([place]) RIGHT ;  Surgeon: Leandrew Koyanagi, MD;  Location: [facility];  Service: Ophthalmology;  Laterality: Right;  DIABETIC - oral meds  . CATARACT EXTRACTION W/PHACO Left 03/15/2016   Procedure: CATARACT EXTRACTION PHACO AND INTRAOCULAR LENS PLACEMENT ([facility])left eye;  Surgeon: Leandrew Koyanagi, MD;  Location: [facility];  Service: Ophthalmology;  Laterality: Left;  DIABETIC - oral meds  . INNER EAR SURGERY     Family History  Problem Relation Age of Onset  . Diabetes Father   . Coronary artery disease Father        s/p CABG  . Hypertension Father   . Hypercholesterolemia Father   .  Hypertension Brother   . Hypertension Sister   . Glaucoma Sister   . Diabetes Unknown        niece  . Breast cancer Maternal Aunt   . Breast cancer Paternal Aunt   . Cervical cancer Paternal Aunt   . Breast cancer Cousin   . Breast cancer Other    Social History   Socioeconomic History  . Marital status: Widowed    Spouse name: None  . Number of children: 3  . Years of education: None  . Highest education level: None  Social Needs  . Financial resource strain: None  . Food insecurity - worry: None  . Food insecurity - inability: None  . Transportation needs - medical: None  . Transportation needs - non-medical: None  Occupational History  . None  Tobacco Use  . Smoking status: Former Smoker    Packs/day: 1.00    Years: 23.00    Pack years: 23.00    Last attempt to quit: 11/20/1983    Years since quitting: 33.8  . Smokeless tobacco: Never Used  Substance and Sexual Activity  . Alcohol use: Yes    Alcohol/week: 0.0 oz    Comment: rare  . Drug use: No  . Sexual activity: No  Other Topics Concern  . None  Social History Narrative  .  None    Outpatient Encounter Medications as of 09/24/2017  Medication Sig  . albuterol (PROVENTIL HFA) 108 (90 Base) MCG/ACT inhaler Inhale 2 puffs into the lungs every 6 (six) hours as needed for wheezing or shortness of breath.  . diclofenac (VOLTAREN) 50 MG EC tablet Take 1 tablet (50 mg total) 2 (two) times daily as needed by mouth.  Marland Kitchen FLUoxetine (PROZAC) 20 MG capsule Take 1 capsule (20 mg total) daily by mouth.  . fluticasone (FLONASE) 50 MCG/ACT nasal spray PLACE 2 SPRAYS INTO BOTH NOSTRILS DAILY.  Marland Kitchen levocetirizine (XYZAL) 5 MG tablet Take 1 tablet (5 mg total) daily by mouth.  . levothyroxine (SYNTHROID, LEVOTHROID) 88 MCG tablet Take 1 tablet (88 mcg total) daily by mouth.  . metFORMIN (GLUCOPHAGE XR) 500 MG 24 hr tablet Take 2 tablets (1,000 mg total) by mouth 2 (two) times daily.  . pantoprazole (PROTONIX) 40 MG tablet TAKE 1  TABLET 2 TIMES DAILY BEFORE A MEAL.  . rosuvastatin (CRESTOR) 40 MG tablet Take 1 tablet (40 mg total) by mouth daily.  . traZODone (DESYREL) 50 MG tablet Take 1 tablet (50 mg total) daily by mouth.  . [DISCONTINUED] ALPRAZolam (XANAX) 0.25 MG tablet TAKE 1 TABLET AT BEDTIME AS NEEDED FOR SLEEP  . [DISCONTINUED] ALPRAZolam (XANAX) 0.25 MG tablet Take 1 tablet (0.25 mg total) at bedtime as needed by mouth. for sleep  . [DISCONTINUED] diclofenac (VOLTAREN) 50 MG EC tablet TAKE 1 TABLET TWICE DAILY AS NEEDED  . [DISCONTINUED] FLUoxetine (PROZAC) 20 MG capsule TAKE 1 CAPSULE EVERY DAY  . [DISCONTINUED] glipiZIDE (GLUCOTROL XL) 2.5 MG 24 hr tablet TAKE 1 TABLET  DAILY WITH BREAKFAST.  . [DISCONTINUED] levocetirizine (XYZAL) 5 MG tablet TAKE 1 TABLET (5 MG TOTAL) BY MOUTH DAILY.  . [DISCONTINUED] levothyroxine (SYNTHROID, LEVOTHROID) 88 MCG tablet TAKE 1 TABLET EVERY DAY  . [DISCONTINUED] pantoprazole (PROTONIX) 40 MG tablet TAKE 1 TABLET 2 TIMES DAILY BEFORE A MEAL.  . [DISCONTINUED] traZODone (DESYREL) 50 MG tablet TAKE 1 TABLET EVERY DAY  . glipiZIDE (GLIPIZIDE XL) 5 MG 24 hr tablet Take 1 tablet (5 mg total) daily with breakfast by mouth.   Facility-Administered Encounter Medications as of 09/24/2017  Medication  . glucose blood test strip STRP 1 each    Review of Systems  Constitutional: Negative for appetite change and unexpected weight change.  HENT: Negative for congestion and sinus pressure.   Respiratory: Negative for cough, chest tightness and shortness of breath.   Cardiovascular: Negative for chest pain, palpitations and leg swelling.  Gastrointestinal: Negative for abdominal pain, diarrhea, nausea and vomiting.  Genitourinary: Negative for difficulty urinating and dysuria.  Musculoskeletal: Negative for back pain and myalgias.  Skin: Negative for color change and rash.  Neurological: Negative for dizziness, light-headedness and headaches.  Psychiatric/Behavioral: Negative for  agitation and dysphoric mood.       Objective:    Physical Exam  Constitutional: She appears well-developed and well-nourished. No distress.  HENT:  Nose: Nose normal.  Mouth/Throat: Oropharynx is clear and moist.  Neck: Neck supple. No thyromegaly present.  Cardiovascular: Normal rate and regular rhythm.  Pulmonary/Chest: Breath sounds normal. No respiratory distress. She has no wheezes.  Abdominal: Soft. Bowel sounds are normal. There is no tenderness.  Musculoskeletal: She exhibits no edema or tenderness.  Lymphadenopathy:    She has no cervical adenopathy.  Skin: No rash noted. No erythema.  Psychiatric: She has a normal mood and affect. Her behavior is normal.    BP 128/60 (BP Location: Left  Arm, Patient Position: Sitting, Cuff Size: Normal)   Pulse 90   Temp 98.6 F (37 C) (Oral)   Resp 14   Wt 147 lb 12.8 oz (67 kg)   SpO2 98%   BMI 27.93 kg/m  Wt Readings from Last 3 Encounters:  09/24/17 147 lb 12.8 oz (67 kg)  05/14/17 146 lb (66.2 kg)  04/13/17 141 lb (64 kg)     Lab Results  Component Value Date   WBC 8.1 12/27/2016   HGB 12.1 12/27/2016   HCT 37.2 12/27/2016   PLT 388 01/19/2017   GLUCOSE 176 (H) 07/25/2017   CHOL 125 07/25/2017   TRIG 120.0 07/25/2017   HDL 57.10 07/25/2017   LDLDIRECT 188.8 06/30/2013   LDLCALC 44 07/25/2017   ALT 13 07/25/2017   AST 13 07/25/2017   NA 138 07/25/2017   K 4.4 07/25/2017   CL 101 07/25/2017   CREATININE 0.82 07/25/2017   BUN 16 07/25/2017   CO2 27 07/25/2017   TSH 1.96 12/27/2016   HGBA1C 7.7 (H) 07/25/2017   MICROALBUR 1.5 01/10/2016    Ct Chest Wo Contrast  Result Date: 02/12/2017 CLINICAL DATA:  71 year old female with history of sinus infection or allergies for the past several weeks. Mediastinal pain during that time. Abnormal chest x-ray concerning for potential left upper lobe pulmonary nodule. Followup study. EXAM: CT CHEST WITHOUT CONTRAST TECHNIQUE: Multidetector CT imaging of the chest was  performed following the standard protocol without IV contrast. COMPARISON:  No priors. FINDINGS: Cardiovascular: Heart size is normal. There is no significant pericardial fluid, thickening or pericardial calcification. There is aortic atherosclerosis, as well as atherosclerosis of the great vessels of the mediastinum and the coronary arteries, including calcified atherosclerotic plaque in the left main, left anterior descending, left circumflex and right coronary arteries. Mediastinum/Nodes: There multiple borderline enlarged and mildly enlarged mediastinal and bilateral hilar lymph nodes, largest of which measure up to 12 mm in short axis in the low right paratracheal nodal station. Esophagus is unremarkable in appearance. No axillary lymphadenopathy. Lungs/Pleura: Relatively diffuse but patchy areas of ground-glass attenuation and mild septal thickening are noted throughout all aspects of the lungs bilaterally. These findings have no definitive craniocaudal gradient. There is no associated honeycombing or traction bronchiectasis. Diffuse bronchial wall thickening with mild centrilobular emphysema. A few scattered pulmonary nodules are noted, including the largest which measures 6 mm in the left upper lobe (which corresponds to the perceived nodule on the recent chest radiograph). No confluent consolidative airspace disease. No pleural effusions. Upper Abdomen: Aortic atherosclerosis. Musculoskeletal: There are no aggressive appearing lytic or blastic lesions noted in the visualized portions of the skeleton. IMPRESSION: 1. There are few scattered small pulmonary nodules, largest of which measures 6 mm in the left upper lobe (corresponding to the perceived abnormality on the recent chest radiograph). Non-contrast chest CT at 3-6 months is recommended. If the nodules are stable at time of repeat CT, then future CT at 18-24 months (from today's scan) is considered optional for low-risk patients, but is recommended for  high-risk patients. This recommendation follows the consensus statement: Guidelines for Management of Incidental Pulmonary Nodules Detected on CT Images: From the Fleischner Society 2017; Radiology 2017; 284:228-243. 2. The appearance of the lungs suggests underlying interstitial lung disease, specifically, the pattern is concerning for nonspecific interstitial pneumonia (NSIP). At the time of the repeat chest CT in 3-6 months for nodule followup, please request a high-resolution chest CT protocol to be performed to better evaluate these findings and  assess for temporal changes in the appearance of the lung parenchyma. 3. Multiple borderline enlarged and mildly enlarged mediastinal and bilateral hilar lymph nodes are nonspecific, and likely chronic in the setting of interstitial lung disease. Attention at time of follow-up imaging is recommended to ensure stability. 4. Aortic atherosclerosis, in addition to left main and 3 vessel coronary artery disease. Assessment for potential risk factor modification, dietary therapy or pharmacologic therapy may be warranted, if clinically indicated. Electronically Signed   By: Vinnie Langton M.D.   On: 02/12/2017 15:47       Assessment & Plan:   Problem List Items Addressed This Visit    Abnormal CXR    Sees Dr Genevive Bi.  Stable.  Recommended f/u in 6 months.        Aortic atherosclerosis ([place])    On crestor.       Depression    Stable on prozac.        Relevant Medications   FLUoxetine (PROZAC) 20 MG capsule   traZODone (DESYREL) 50 MG tablet   Diabetes mellitus ([facility])    Low carb diet and exercise.  Follow met b and a1c.  Sugars reviewed.  Still elevated.  Discussed diet and exercise and eating regularly.  Increase glipizide to 58m q day.  Follow.        Relevant Medications   glipiZIDE (GLIPIZIDE XL) 5 MG 24 hr tablet   Other Relevant Orders   Hemoglobin AP9X  Basic metabolic panel   GERD (gastroesophageal reflux disease)    Controlled on  protonix.        Relevant Medications   pantoprazole (PROTONIX) 40 MG tablet   Hypercholesterolemia    On crestor.  Low cholesterol diet and exercise.  Follow lipid panel and liver function tests.        Relevant Orders   Hepatic function panel   Lipid panel   Hypothyroidism    On thyroid replacement.  Follow tsh.        Relevant Medications   levothyroxine (SYNTHROID, LEVOTHROID) 88 MCG tablet    Other Visit Diagnoses    Thrombocytosis ([place]    -  Primary   Relevant Orders   CBC with Differential/Platelet       SEinar Pheasant MD

## 2017-09-25 ENCOUNTER — Ambulatory Visit
Admission: RE | Admit: 2017-09-25 | Discharge: 2017-09-25 | Disposition: A | Discharge status: Home / Self Care | Payer: Medicare HMO | Source: Ambulatory Visit | Attending: Internal Medicine | Admitting: Internal Medicine

## 2017-09-25 DIAGNOSIS — Z1231 Encounter for screening mammogram for malignant neoplasm of breast: Secondary | ICD-10-CM

## 2017-09-27 ENCOUNTER — Encounter: Payer: Self-pay | Admitting: Internal Medicine

## 2017-09-27 NOTE — Assessment & Plan Note (Signed)
Stable on prozac.  

## 2017-09-27 NOTE — Assessment & Plan Note (Signed)
Low carb diet and exercise.  Follow met b and a1c.  Sugars reviewed.  Still elevated.  Discussed diet and exercise and eating regularly.  Increase glipizide to 67m q day.  Follow.

## 2017-09-27 NOTE — Assessment & Plan Note (Signed)
Sees Dr Genevive Bi.  Stable.  Recommended f/u in 6 months.

## 2017-09-27 NOTE — Assessment & Plan Note (Signed)
On thyroid replacement.  Follow tsh.  

## 2017-09-27 NOTE — Assessment & Plan Note (Signed)
On crestor.  Low cholesterol diet and exercise.  Follow lipid panel and liver function tests.   

## 2017-09-27 NOTE — Assessment & Plan Note (Signed)
Controlled on protonix.   

## 2017-09-27 NOTE — Assessment & Plan Note (Signed)
On [facility].   

## 2017-10-03 ENCOUNTER — Telehealth: Payer: Self-pay

## 2017-10-03 MED ORDER — GLUCOSE BLOOD VI STRP: ORAL_STRIP | 12 refills | Status: DC

## 2017-10-03 NOTE — Addendum Note (Signed)
Addended by: Elpidio Galea T on: 10/03/2017 02:07 PM   Modules accepted: Orders

## 2017-10-03 NOTE — Telephone Encounter (Signed)
Is this ok to do so?

## 2017-10-03 NOTE — Telephone Encounter (Signed)
Copied from [place] [phone]. Topic: Inquiry >> Oct 03, 2017  9:51 AM Ether Griffins B wrote: Reason for CRM: accu check monitor broke pt would like to know if she can come to the office and pick one up

## 2017-10-03 NOTE — Telephone Encounter (Signed)
sure

## 2017-10-03 NOTE — Telephone Encounter (Signed)
Machine was placed up front for pick and patient was notified.

## 2017-10-04 ENCOUNTER — Telehealth: Payer: Self-pay

## 2017-10-04 NOTE — Telephone Encounter (Signed)
Results have yet to be read by provider. Report does say negative.

## 2017-10-04 NOTE — Telephone Encounter (Signed)
Copied from [place] [phone]. Topic: General - Other >> Oct 04, 2017 10:48 AM Yvette Rack wrote: Reason for CRM: would like results of mammogram

## 2017-10-19 ENCOUNTER — Ambulatory Visit (INDEPENDENT_AMBULATORY_CARE_PROVIDER_SITE_OTHER): Payer: Medicare HMO | Admitting: Vascular Surgery

## 2017-10-19 ENCOUNTER — Encounter (INDEPENDENT_AMBULATORY_CARE_PROVIDER_SITE_OTHER): Payer: Medicare HMO

## 2017-10-24 ENCOUNTER — Other Ambulatory Visit: Payer: Self-pay | Admitting: Internal Medicine

## 2017-11-22 ENCOUNTER — Ambulatory Visit (INDEPENDENT_AMBULATORY_CARE_PROVIDER_SITE_OTHER): Payer: Medicare HMO | Admitting: Family Medicine

## 2017-11-22 ENCOUNTER — Encounter: Payer: Self-pay | Admitting: Family Medicine

## 2017-11-22 VITALS — BP 100/60 | HR 92 | Temp 98.2°F | Ht 61.0 in | Wt 145.0 lb

## 2017-11-22 DIAGNOSIS — R059 Cough, unspecified: Secondary | ICD-10-CM

## 2017-11-22 DIAGNOSIS — J324 Chronic pansinusitis: Secondary | ICD-10-CM | POA: Diagnosis not present

## 2017-11-22 DIAGNOSIS — R05 Cough: Secondary | ICD-10-CM | POA: Diagnosis not present

## 2017-11-22 MED ORDER — BENZONATATE 100 MG PO CAPS: 100.0000 mg | ORAL_CAPSULE | Freq: Three times a day (TID) | ORAL | 0 refills | Status: DC

## 2017-11-22 MED ORDER — AMOXICILLIN-POT CLAVULANATE 875-125 MG PO TABS: 1.0000 | ORAL_TABLET | Freq: Two times a day (BID) | ORAL | 0 refills | Status: DC

## 2017-11-22 NOTE — Patient Instructions (Signed)
Please drink plenty of water so that your urine is pale yellow or clear. Also, get plenty of rest, use tylenol as needed for discomfort and follow up if symptoms do not improve in 3 to 4 days, worsen, or you develop a fever >101.   Sinusitis, Adult Sinusitis is soreness and inflammation of your sinuses. Sinuses are hollow spaces in the bones around your face. They are located:  Around your eyes.  In the middle of your forehead.  Behind your nose.  In your cheekbones.  Your sinuses and nasal passages are lined with a stringy fluid (mucus). Mucus normally drains out of your sinuses. When your nasal tissues get inflamed or swollen, the mucus can get trapped or blocked so air cannot flow through your sinuses. This lets bacteria, viruses, and funguses grow, and that leads to infection. Follow these instructions at home: Medicines  Take, use, or apply over-the-counter and prescription medicines only as told by your doctor. These may include nasal sprays.  If you were prescribed an antibiotic medicine, take it as told by your doctor. Do not stop taking the antibiotic even if you start to feel better. Hydrate and Humidify  Drink enough water to keep your pee (urine) clear or pale yellow.  Use a cool mist humidifier to keep the humidity level in your home above 50%.  Breathe in steam for 10-15 minutes, 3-4 times a day or as told by your doctor. You can do this in the bathroom while a hot shower is running.  Try not to spend time in cool or dry air. Rest  Rest as much as possible.  Sleep with your head raised (elevated).  Make sure to get enough sleep each night. General instructions  Put a warm, moist washcloth on your face 3-4 times a day or as told by your doctor. This will help with discomfort.  Wash your hands often with soap and water. If there is no soap and water, use hand sanitizer.  Do not smoke. Avoid being around people who are smoking (secondhand smoke).  Keep all  follow-up visits as told by your doctor. This is important. Contact a doctor if:  You have a fever.  Your symptoms get worse.  Your symptoms do not get better within 10 days. Get help right away if:  You have a very bad headache.  You cannot stop throwing up (vomiting).  You have pain or swelling around your face or eyes.  You have trouble seeing.  You feel confused.  Your neck is stiff.  You have trouble breathing. This information is not intended to replace advice given to you by your health care provider. Make sure you discuss any questions you have with your health care provider. Document Released: 04/10/2008 Document Revised: 06/18/2016 Document Reviewed: 08/18/2015 Elsevier Interactive Patient Education  [employer].

## 2017-11-22 NOTE — Progress Notes (Signed)
Patient ID: Jody Wang, female   DOB: 05-May-1946, 72 y.o.   MRN: [id number]  PCP: Jody Pheasant, MD  Subjective:  Jody Wang is a 72 y.o. year old very pleasant female patient who presents with symptoms including nasal congestion, sore throat, cough that is productive of green sputum, sinus pressure  -started: 10 days ago, symptoms are not improving -previous treatments: Mucinex has provided limited benefit,  -sick contacts/travel/risks: denies flu exposure.  -Hx of: allergies Former smoker; quit date: 11/20/1983; 23 pack year She reports taking 2 days of "leftover" antibiotics from her son. She does not remember name of antibiotic.  ROS-denies fever, SOB, NVD, tooth pain  Pertinent Past Medical History- CAD, GERD, DM  Medications- reviewed  Current Outpatient Medications  Medication Sig Dispense Refill  . albuterol (PROVENTIL HFA) 108 (90 Base) MCG/ACT inhaler Inhale 2 puffs into the lungs every 6 (six) hours as needed for wheezing or shortness of breath. 1 Inhaler 1  . diclofenac (VOLTAREN) 50 MG EC tablet Take 1 tablet (50 mg total) 2 (two) times daily as needed by mouth. 180 tablet 0  . FLUoxetine (PROZAC) 20 MG capsule Take 1 capsule (20 mg total) daily by mouth. 90 capsule 1  . fluticasone (FLONASE) 50 MCG/ACT nasal spray PLACE 2 SPRAYS INTO BOTH NOSTRILS DAILY. 48 g 3  . glipiZIDE (GLIPIZIDE XL) 5 MG 24 hr tablet Take 1 tablet (5 mg total) daily with breakfast by mouth. 90 tablet 1  . glucose blood (ONETOUCH VERIO) test strip Use as instructed 100 each 12  . levocetirizine (XYZAL) 5 MG tablet Take 1 tablet (5 mg total) daily by mouth. 90 tablet 1  . levothyroxine (SYNTHROID, LEVOTHROID) 88 MCG tablet Take 1 tablet (88 mcg total) daily by mouth. 90 tablet 1  . metFORMIN (GLUCOPHAGE-XR) 500 MG 24 hr tablet TAKE 2 TABLETS TWICE DAILY 360 tablet 1  . pantoprazole (PROTONIX) 40 MG tablet TAKE 1 TABLET 2 TIMES DAILY BEFORE A MEAL. 180 tablet 1  . rosuvastatin (CRESTOR)  40 MG tablet Take 1 tablet (40 mg total) by mouth daily. 90 tablet 3  . traZODone (DESYREL) 50 MG tablet Take 1 tablet (50 mg total) daily by mouth. 90 tablet 0   Current Facility-Administered Medications  Medication Dose Route Frequency Provider Last Rate Last Dose  . glucose blood test strip STRP 1 each  1 each Other PRN Jody Pheasant, MD        Objective: BP 100/60 (BP Location: Left Arm, Patient Position: Sitting, Cuff Size: Normal)   Pulse 92   Temp 98.2 F (36.8 C) (Oral)   Ht 5\' 1"  (1.549 m)   Wt 145 lb (65.8 kg)   SpO2 96%   BMI 27.40 kg/m  Gen: NAD, resting comfortably HEENT: Turbinates erythematous, TM normal, pharynx mildly erythematous with no tonsilar exudate or edema, + frontal and maxillary sinus tenderness CV: RRR no murmurs rubs or gallops Lungs: CTAB no crackles, wheeze, rhonchi Abdomen: soft/nontender/nondistended/normal bowel sounds. No rebound or guarding.  Ext: no edema Skin: warm, dry, no rash Neuro: grossly normal, moves all extremities  Assessment/Plan: 1. Pansinusitis, unspecified chronicity Exam and history support empiric treatment for bacterial respiratory infection. Advised patient on supportive measures:  Get rest, drink plenty of fluids, and use tylenol as needed for pain. Follow up if fever >101, if symptoms worsen or if symptoms are not improved in 3-4 days. Patient  verbalizes understanding and agrees with plan.  - amoxicillin-clavulanate (AUGMENTIN) 875-125 MG tablet; Take 1 tablet by mouth 2 (  two) times daily.  Dispense: 20 tablet; Refill: 0  2. Cough Advised increasing fluids. No adventitious lung sounds found on exam. - benzonatate (TESSALON) 100 MG capsule; Take 1 capsule (100 mg total) by mouth 3 (three) times daily.  Dispense: 20 capsule; Refill: 0   Finally, we reviewed reasons to return to care including if symptoms worsen or persist or new concerns arise- once again particularly shortness of breath or fever.    Laurita Quint, FNP

## 2017-11-26 ENCOUNTER — Other Ambulatory Visit: Payer: Medicare HMO

## 2017-12-03 ENCOUNTER — Ambulatory Visit: Payer: Medicare HMO

## 2018-01-08 ENCOUNTER — Encounter: Payer: Medicare HMO | Admitting: Internal Medicine

## 2018-01-11 ENCOUNTER — Other Ambulatory Visit: Payer: Self-pay | Admitting: Internal Medicine

## 2018-01-21 ENCOUNTER — Telehealth: Payer: Self-pay | Admitting: Internal Medicine

## 2018-01-21 NOTE — Telephone Encounter (Unsigned)
Copied from [place]. Topic: Quick Communication - Rx Refill/Question >> Jan 21, 2018 12:38 PM Neva Seat wrote: levocetirizine (XYZAL) 5 MG tablet  Pt's Rx is out.  Please call in [place], [place] [address] Phone: [phone] Fax: [phone]

## 2018-01-21 NOTE — Telephone Encounter (Signed)
Left message for pt to contact [facility] for refill. Refill was sent on 01/11/18 to [facility].

## 2018-02-01 ENCOUNTER — Ambulatory Visit: Payer: Medicare HMO

## 2018-02-04 ENCOUNTER — Telehealth: Payer: Self-pay | Admitting: Internal Medicine

## 2018-02-04 NOTE — Telephone Encounter (Unsigned)
Copied from [place] [phone]. Topic: Quick Communication - Rx Refill/Question >> Feb 04, 2018  2:59 PM Neva Seat wrote: fluticasone Asencion Islam) 50 MCG/ACT nasal spray  [facility] - [place], [place] [address] Phone: [phone] Fax: [phone]

## 2018-02-05 ENCOUNTER — Other Ambulatory Visit: Payer: Self-pay

## 2018-02-05 MED ORDER — FLUTICASONE PROPIONATE 50 MCG/ACT NA SUSP: 2.0000 | Freq: Every day | NASAL | 3 refills | Status: DC

## 2018-02-08 ENCOUNTER — Ambulatory Visit (INDEPENDENT_AMBULATORY_CARE_PROVIDER_SITE_OTHER): Payer: Medicare HMO

## 2018-02-08 ENCOUNTER — Other Ambulatory Visit: Payer: Self-pay

## 2018-02-08 VITALS — BP 118/68 | HR 78 | Temp 98.1°F | Resp 12 | Ht 61.5 in | Wt 150.4 lb

## 2018-02-08 DIAGNOSIS — Z760 Encounter for issue of repeat prescription: Secondary | ICD-10-CM

## 2018-02-08 DIAGNOSIS — Z0000 Encounter for general adult medical examination without abnormal findings: Secondary | ICD-10-CM

## 2018-02-08 NOTE — Progress Notes (Signed)
Subjective:   Jody Wang is a 72 y.o. female who presents for Medicare Annual (Subsequent) preventive examination.  Review of Systems:  No ROS.  Medicare Wellness Visit. Additional risk factors are reflected in the social history. Cardiac Risk Factors include: advanced age (>16men, >67 women);diabetes mellitus     Objective:     Vitals: BP 118/68 (BP Location: Left Arm, Patient Position: Sitting, Cuff Size: Normal)   Pulse 78   Temp 98.1 F (36.7 C) (Oral)   Resp 12   Ht 5' 1.5" (1.562 m)   Wt 150 lb 6.4 oz (68.2 kg)   SpO2 97%   BMI 27.96 kg/m   Body mass index is 27.96 kg/m.  Advanced Directives 02/08/2018 04/13/2017 11/30/2016 03/15/2016 02/16/2016 11/30/2015  Does Patient Have a Medical Advance Directive? No Yes No No No No  Type of Advance Directive - [place]  Does patient want to make changes to medical advance directive? - - No - Patient declined - - -  Would patient like information on creating a medical advance directive? No - Patient declined - No - Patient declined No - patient declined information No - patient declined information Yes - Scientist, clinical (histocompatibility and immunogenetics) given    Tobacco Social History   Tobacco Use  Smoking Status Former Smoker  . Packs/day: 1.00  . Years: 23.00  . Pack years: 23.00  . Last attempt to quit: 11/20/1983  . Years since quitting: 34.2  Smokeless Tobacco Never Used     Counseling given: Not Answered   Clinical Intake:  Pre-visit preparation completed: Yes  Pain : No/denies pain     Nutritional Status: BMI 25 -29 Overweight Diabetes: Yes(Followed by PCP)  How often do you need to have someone help you when you read instructions, pamphlets, or other written materials from your doctor or pharmacy?: 1 - Never  Interpreter Needed?: No     Past Medical History:  Diagnosis Date  . Depression   . Diabetes mellitus without complication ([place])   . GERD (gastroesophageal reflux disease)   . Heart  murmur    slight - followed by PCP  . Hypercholesterolemia   . Hypothyroidism   . Osteoarthritis of knee   . Wears dentures    partial lower   Past Surgical History:  Procedure Laterality Date  . ABDOMINAL HYSTERECTOMY  1985   ovaries not removed  . BREAST CYST EXCISION Right   . BREAST CYST EXCISION Left   . CATARACT EXTRACTION W/PHACO Right 02/16/2016   Procedure: CATARACT EXTRACTION PHACO AND INTRAOCULAR LENS PLACEMENT ([facility]) RIGHT ;  Surgeon: Leandrew Koyanagi, MD;  Location: [facility];  Service: Ophthalmology;  Laterality: Right;  DIABETIC - oral meds  . CATARACT EXTRACTION W/PHACO Left 03/15/2016   Procedure: CATARACT EXTRACTION PHACO AND INTRAOCULAR LENS PLACEMENT ([facility])left eye;  Surgeon: Leandrew Koyanagi, MD;  Location: [facility];  Service: Ophthalmology;  Laterality: Left;  DIABETIC - oral meds  . INNER EAR SURGERY     Family History  Problem Relation Age of Onset  . Diabetes Father   . Coronary artery disease Father        s/p CABG  . Hypertension Father   . Hypercholesterolemia Father   . Hypertension Brother   . Diabetes Brother   . Hypertension Sister   . Glaucoma Sister   . Diabetes Unknown        niece  . Breast cancer Maternal Aunt   . Breast cancer Paternal Aunt   .  Cervical cancer Paternal Aunt   . Breast cancer Cousin   . Breast cancer Other    Social History   Socioeconomic History  . Marital status: Widowed    Spouse name: Not on file  . Number of children: 3  . Years of education: Not on file  . Highest education level: Not on file  Occupational History  . Not on file  Social Needs  . Financial resource strain: Not hard at all  . Food insecurity:    Worry: Never true    Inability: Never true  . Transportation needs:    Medical: No    Non-medical: No  Tobacco Use  . Smoking status: Former Smoker    Packs/day: 1.00    Years: 23.00    Pack years: 23.00    Last attempt to quit: 11/20/1983    Years since quitting:  34.2  . Smokeless tobacco: Never Used  Substance and Sexual Activity  . Alcohol use: Yes    Alcohol/week: 0.0 oz    Comment: rare  . Drug use: No  . Sexual activity: Never  Lifestyle  . Physical activity:    Days per week: Not on file    Minutes per session: Not on file  . Stress: Not on file  Relationships  . Social connections:    Talks on phone: Not on file    Gets together: Not on file    Attends religious service: Not on file    Active member of club or organization: Not on file    Attends meetings of clubs or organizations: Not on file    Relationship status: Not on file  Other Topics Concern  . Not on file  Social History Narrative  . Not on file    Outpatient Encounter Medications as of 02/08/2018  Medication Sig  . albuterol (PROVENTIL HFA) 108 (90 Base) MCG/ACT inhaler Inhale 2 puffs into the lungs every 6 (six) hours as needed for wheezing or shortness of breath.  . cholecalciferol (VITAMIN D) 1000 units tablet Take 1,000 Units by mouth daily.  . cyanocobalamin 1000 MCG tablet Take 1,000 mcg by mouth daily.  . diclofenac (VOLTAREN) 50 MG EC tablet Take 1 tablet (50 mg total) 2 (two) times daily as needed by mouth.  Marland Kitchen FLUoxetine (PROZAC) 20 MG capsule Take 1 capsule (20 mg total) daily by mouth.  . fluticasone (FLONASE) 50 MCG/ACT nasal spray Place 2 sprays into both nostrils daily.  Marland Kitchen glipiZIDE (GLIPIZIDE XL) 5 MG 24 hr tablet Take 1 tablet (5 mg total) daily with breakfast by mouth.  Marland Kitchen glucose blood (ONETOUCH VERIO) test strip Use as instructed  . levocetirizine (XYZAL) 5 MG tablet TAKE 1 TABLET EVERY DAY  . levothyroxine (SYNTHROID, LEVOTHROID) 88 MCG tablet Take 1 tablet (88 mcg total) daily by mouth.  . metFORMIN (GLUCOPHAGE-XR) 500 MG 24 hr tablet TAKE 2 TABLETS TWICE DAILY  . pantoprazole (PROTONIX) 40 MG tablet TAKE 1 TABLET 2 TIMES DAILY BEFORE A MEAL.  . rosuvastatin (CRESTOR) 40 MG tablet Take 1 tablet (40 mg total) by mouth daily.  . traZODone (DESYREL)  50 MG tablet Take 1 tablet (50 mg total) daily by mouth.  . [DISCONTINUED] amoxicillin-clavulanate (AUGMENTIN) 875-125 MG tablet Take 1 tablet by mouth 2 (two) times daily.  . [DISCONTINUED] benzonatate (TESSALON) 100 MG capsule Take 1 capsule (100 mg total) by mouth 3 (three) times daily.   Facility-Administered Encounter Medications as of 02/08/2018  Medication  . glucose blood test strip STRP 1 each  Activities of Daily Living In your present state of health, do you have any difficulty performing the following activities: 02/08/2018  Hearing? N  Vision? N  Difficulty concentrating or making decisions? N  Walking or climbing stairs? Y  Comment Knee pain, bilateral  Dressing or bathing? N  Doing errands, shopping? N  Preparing Food and eating ? N  Using the Toilet? N  In the past six months, have you accidently leaked urine? Y  Comment Managed with a daily pad  Do you have problems with loss of bowel control? N  Managing your Medications? N  Managing your Finances? N  Housekeeping or managing your Housekeeping? N  Some recent data might be hidden    Patient Care Team: Einar Pheasant, MD as PCP - General (Internal Medicine) Minna Merritts, MD as Consulting Physician (Cardiology)    Assessment:   This is a routine wellness examination for [place].  The goal of the wellness visit is to assist the patient how to close the gaps in care and create a preventative care plan for the patient.   The roster of all physicians providing medical care to patient is listed in the Snapshot section of the chart.  Taking calcium VIT D as appropriate/Osteoporosis risk reviewed.    Safety issues reviewed; Smoke and carbon monoxide detectors in the home. No firearms in the home. Wears seatbelts when driving or riding with others. No violence in the home.  They do not have excessive sun exposure.  Discussed the need for sun protection: hats, long sleeves and the use of sunscreen if there is  significant sun exposure.  Patient is alert, normal appearance, oriented to person/place/and time.  Correctly identified the president of the [place] and recalls of 3/3 words. Performs simple calculations and can read correct time from watch face. Displays appropriate judgement.  No new identified risk were noted.  No failures at ADL's or IADL's.    BMI- discussed the importance of a healthy diet, water intake and the benefits of aerobic exercise. Educational material provided.   24 hour diet recall: Low carb diet  Dental- every 6 months.  Eye- Visual acuity not assessed per patient preference since they have regular follow up with the ophthalmologist.  Wears corrective lenses. She has scheduled her annual eye exam.   Sleep patterns- Sleeps 10 hours at night.  Wakes feeling rested.   TDAP vaccine deferred per patient preference.  Follow up with insurance.  Educational material provided.  Diabetes- followed by PCP. Controlled. Foot exam due.  Patient Concerns: None at this time. Follow up with PCP as needed.  Exercise Activities and Dietary recommendations Current Exercise Habits: The patient does not participate in regular exercise at present  Goals    . DIET - INCREASE WATER INTAKE     Stay hydrated.    . Increase physical activity     Water aerobics       Fall Risk Fall Risk  02/08/2018 11/30/2016 09/25/2016 07/17/2016 11/30/2015  Falls in the past year? No No No No Yes  Number falls in past yr: - - - - 2 or more  Injury with Fall? - - - - No  Comment - - - - Missed steps  Follow up - - - - Education provided;Falls prevention discussed   Depression Screen PHQ 2/9 Scores 02/08/2018 11/30/2016 09/25/2016 07/17/2016  PHQ - 2 Score 0 1 0 0  PHQ- 9 Score - - - -     Cognitive Function MMSE - Mini Mental  State Exam 11/30/2015  Orientation to time 5  Orientation to Place 5  Registration 3  Attention/ Calculation 5  Recall 3  Language- name 2 objects 2  Language- repeat 1    Language- follow 3 step command 3  Language- read & follow direction 1  Write a sentence 1  Copy design 1  Total score 30     6CIT Screen 02/08/2018 11/30/2016  What Year? 0 points 0 points  What month? 0 points 0 points  What time? 0 points 0 points  Count back from 20 0 points 0 points  Months in reverse 0 points 0 points  Repeat phrase 0 points 0 points  Total Score 0 0    Immunization History  Administered Date(s) Administered  . Influenza Split 08/24/2013, 08/20/2014  . Influenza, High Dose Seasonal PF 09/25/2016  . Influenza,inj,Quad PF,6+ Mos 07/09/2015  . Influenza-Unspecified 08/10/2017  . Pneumococcal Conjugate-13 10/08/2015  . Pneumococcal Polysaccharide-23 07/01/2013  . Zoster 08/24/2013   Screening Tests Health Maintenance  Topic Date Due  . TETANUS/TDAP  10/08/1964  . FOOT EXAM  06/16/2014  . URINE MICROALBUMIN  01/09/2017  . OPHTHALMOLOGY EXAM  01/24/2017  . HEMOGLOBIN A1C  01/22/2018  . DEXA SCAN  03/19/2018 (Originally 10/08/2010)  . INFLUENZA VACCINE  06/06/2018  . Fecal DNA (Cologuard)  11/16/2018  . MAMMOGRAM  09/26/2019  . Hepatitis C Screening  Completed  . PNA vac Low Risk Adult  Completed      Plan:   End of life planning; Advanced aging; Advanced directives discussed.  No HCPOA/Living Will.  Additional information declined at this time.  I have personally reviewed and noted the following in the patient's chart:   . Medical and social history . Use of alcohol, tobacco or illicit drugs  . Current medications and supplements . Functional ability and status . Nutritional status . Physical activity . Advanced directives . List of other physicians . Hospitalizations, surgeries, and ER visits in previous 12 months . Vitals . Screenings to include cognitive, depression, and falls . Referrals and appointments  In addition, I have reviewed and discussed with patient certain preventive protocols, quality metrics, and best practice  recommendations. A written personalized care plan for preventive services as well as general preventive health recommendations were provided to patient.     Varney Biles, LPN  11/07/4578   Agree with plan. Mable Paris, NP

## 2018-02-08 NOTE — Patient Instructions (Addendum)
  Jody Wang , Thank you for taking time to come for your Medicare Wellness Visit. I appreciate your ongoing commitment to your health goals. Please review the following plan we discussed and let me know if I can assist you in the future.   Follow up as needed.    Have a great day!  These are the goals we discussed: Goals    . Healthy Lifestyle     Stay hydrated.  Substitute 1 bottle of coke for 1 of water Low carb foods.  Lean meats, fruits and vegetables/V8 juice    . Increase physical activity     Water aerobics       This is a list of the screening recommended for you and due dates:  Health Maintenance  Topic Date Due  . Tetanus Vaccine  10/08/1964  . Complete foot exam   06/16/2014  . Urine Protein Check  01/09/2017  . Eye exam for diabetics  01/24/2017  . Hemoglobin A1C  01/22/2018  . DEXA scan (bone density measurement)  03/19/2018*  . Flu Shot  06/06/2018  . Cologuard (Stool DNA test)  11/16/2018  . Mammogram  09/26/2019  .  Hepatitis C: One time screening is recommended by Center for Disease Control  (CDC) for  adults born from [birth year] through 1966.   Completed  . Pneumonia vaccines  Completed  *Topic was postponed. The date shown is not the original due date.

## 2018-02-10 MED ORDER — DICLOFENAC SODIUM 50 MG PO TBEC: 50.0000 mg | DELAYED_RELEASE_TABLET | Freq: Two times a day (BID) | ORAL | 0 refills | Status: DC | PRN

## 2018-02-12 ENCOUNTER — Ambulatory Visit: Payer: Medicare HMO | Admitting: Cardiovascular Disease

## 2018-02-18 ENCOUNTER — Telehealth: Payer: Self-pay | Admitting: Internal Medicine

## 2018-02-18 NOTE — Telephone Encounter (Signed)
Copied from [place] [phone]. Topic: Inquiry >> Feb 18, 2018 12:13 PM Arletha Grippe wrote: Reason for CRM:pt needs letter than she had shingles and the dates that she was diagnosed.  She also needs to have the treatment plan that happened on the letter.  She says this was in august of 2016 Cb is [phone] Please mail to home address.

## 2018-02-19 NOTE — Telephone Encounter (Signed)
Patient stating she wants letter stating she had shingles was treated by [place]. She was seen on 06/30/2016 for unspecified rash and treated with Valtrex. This letter is for a lawsuit.

## 2018-02-19 NOTE — Telephone Encounter (Signed)
Immunization record printed.  

## 2018-03-13 ENCOUNTER — Other Ambulatory Visit: Payer: Self-pay | Admitting: Cardiovascular Disease

## 2018-03-13 ENCOUNTER — Telehealth: Payer: Self-pay | Admitting: Internal Medicine

## 2018-03-13 NOTE — Telephone Encounter (Signed)
Copied from [place] [phone]. Topic: Quick Communication - Rx Refill/Question >> Mar 13, 2018 11:34 AM Boyd Kerbs wrote: Medication:  rosuvastatin (CRESTOR) 40 MG tablet  Has the patient contacted their pharmacy? No. (Agent: If no, request that the patient contact the pharmacy for the refill.) Preferred Pharmacy (with phone number or street name):   [facility], [place] [address] Phone: [phone] Fax: [phone]   Agent: Please be advised that RX refills may take up to 3 business days. We ask that you follow-up with your pharmacy.

## 2018-03-14 ENCOUNTER — Ambulatory Visit (INDEPENDENT_AMBULATORY_CARE_PROVIDER_SITE_OTHER): Payer: Medicare HMO | Admitting: Internal Medicine

## 2018-03-14 ENCOUNTER — Encounter: Payer: Self-pay | Admitting: Internal Medicine

## 2018-03-14 VITALS — BP 130/70 | HR 94 | Temp 98.8°F | Ht 61.5 in | Wt 149.2 lb

## 2018-03-14 DIAGNOSIS — J40 Bronchitis, not specified as acute or chronic: Secondary | ICD-10-CM

## 2018-03-14 DIAGNOSIS — R918 Other nonspecific abnormal finding of lung field: Secondary | ICD-10-CM

## 2018-03-14 DIAGNOSIS — R0602 Shortness of breath: Secondary | ICD-10-CM

## 2018-03-14 DIAGNOSIS — L570 Actinic keratosis: Secondary | ICD-10-CM | POA: Diagnosis not present

## 2018-03-14 DIAGNOSIS — J849 Interstitial pulmonary disease, unspecified: Secondary | ICD-10-CM

## 2018-03-14 MED ORDER — AZITHROMYCIN 250 MG PO TABS: ORAL_TABLET | ORAL | 0 refills | Status: DC

## 2018-03-14 MED ORDER — ALBUTEROL SULFATE HFA 108 (90 BASE) MCG/ACT IN AERS: 2.0000 | INHALATION_SPRAY | Freq: Four times a day (QID) | RESPIRATORY_TRACT | 0 refills | Status: DC | PRN

## 2018-03-14 MED ORDER — IPRATROPIUM-ALBUTEROL 0.5-2.5 (3) MG/3ML IN SOLN
3.0000 mL | Freq: Once | RESPIRATORY_TRACT | Status: AC
  Administered 2018-03-14: 3 mL via RESPIRATORY_TRACT

## 2018-03-14 NOTE — Telephone Encounter (Signed)
Rx refilled 03/13/18 by outside provider

## 2018-03-14 NOTE — Progress Notes (Signed)
Chief Complaint  Patient presents with  . URI   Sick visit  C/o not feeling well son who is POA is with her today c/o sore throat, h/a, stuffy nose, chills, tired, tried Delsym w/o help and has dry cough w/o fever. She also has sinus drainage   Review of Systems  Constitutional: Positive for malaise/fatigue. Negative for weight loss.  HENT: Positive for congestion and sore throat.   Respiratory: Positive for cough and wheezing. Negative for sputum production.   Cardiovascular: Negative for chest pain.   Past Medical History:  Diagnosis Date  . Depression   . Diabetes mellitus without complication ([place])   . GERD (gastroesophageal reflux disease)   . Heart murmur    slight - followed by PCP  . Hypercholesterolemia   . Hypothyroidism   . Osteoarthritis of knee   . Wears dentures    partial lower   Past Surgical History:  Procedure Laterality Date  . ABDOMINAL HYSTERECTOMY  1985   ovaries not removed  . BREAST CYST EXCISION Right   . BREAST CYST EXCISION Left   . CATARACT EXTRACTION W/PHACO Right 02/16/2016   Procedure: CATARACT EXTRACTION PHACO AND INTRAOCULAR LENS PLACEMENT ([place]) RIGHT ;  Surgeon: Leandrew Koyanagi, MD;  Location: [facility];  Service: Ophthalmology;  Laterality: Right;  DIABETIC - oral meds  . CATARACT EXTRACTION W/PHACO Left 03/15/2016   Procedure: CATARACT EXTRACTION PHACO AND INTRAOCULAR LENS PLACEMENT ([facility])left eye;  Surgeon: Leandrew Koyanagi, MD;  Location: [facility];  Service: Ophthalmology;  Laterality: Left;  DIABETIC - oral meds  . INNER EAR SURGERY     Family History  Problem Relation Age of Onset  . Diabetes Father   . Coronary artery disease Father        s/p CABG  . Hypertension Father   . Hypercholesterolemia Father   . Hypertension Brother   . Diabetes Brother   . Hypertension Sister   . Glaucoma Sister   . Diabetes Unknown        niece  . Breast cancer Maternal Aunt   . Breast cancer Paternal Aunt   . Cervical  cancer Paternal Aunt   . Breast cancer Cousin   . Breast cancer Other    Social History   Socioeconomic History  . Marital status: Widowed    Spouse name: Not on file  . Number of children: 3  . Years of education: Not on file  . Highest education level: Not on file  Occupational History  . Not on file  Social Needs  . Financial resource strain: Not hard at all  . Food insecurity:    Worry: Never true    Inability: Never true  . Transportation needs:    Medical: No    Non-medical: No  Tobacco Use  . Smoking status: Former Smoker    Packs/day: 1.00    Years: 23.00    Pack years: 23.00    Last attempt to quit: 11/20/1983    Years since quitting: 34.3  . Smokeless tobacco: Never Used  Substance and Sexual Activity  . Alcohol use: Yes    Alcohol/week: 0.0 oz    Comment: rare  . Drug use: No  . Sexual activity: Never  Lifestyle  . Physical activity:    Days per week: Not on file    Minutes per session: Not on file  . Stress: Not on file  Relationships  . Social connections:    Talks on phone: Not on file    Gets together:  Not on file    Attends religious service: Not on file    Active member of club or organization: Not on file    Attends meetings of clubs or organizations: Not on file    Relationship status: Not on file  . Intimate partner violence:    Fear of current or ex partner: Not on file    Emotionally abused: Not on file    Physically abused: Not on file    Forced sexual activity: Not on file  Other Topics Concern  . Not on file  Social History Narrative  . Not on file   Current Meds  Medication Sig  . albuterol (PROVENTIL HFA) 108 (90 Base) MCG/ACT inhaler Inhale 2 puffs into the lungs every 6 (six) hours as needed for wheezing or shortness of breath.  . cholecalciferol (VITAMIN D) 1000 units tablet Take 1,000 Units by mouth daily.  . cyanocobalamin 1000 MCG tablet Take 1,000 mcg by mouth daily.  . diclofenac (VOLTAREN) 50 MG EC tablet Take 1 tablet  (50 mg total) by mouth 2 (two) times daily as needed.  Marland Kitchen FLUoxetine (PROZAC) 20 MG capsule Take 1 capsule (20 mg total) daily by mouth.  . fluticasone (FLONASE) 50 MCG/ACT nasal spray Place 2 sprays into both nostrils daily.  Marland Kitchen glipiZIDE (GLIPIZIDE XL) 5 MG 24 hr tablet Take 1 tablet (5 mg total) daily with breakfast by mouth.  Marland Kitchen glucose blood (ONETOUCH VERIO) test strip Use as instructed  . levocetirizine (XYZAL) 5 MG tablet TAKE 1 TABLET EVERY DAY  . levothyroxine (SYNTHROID, LEVOTHROID) 88 MCG tablet Take 1 tablet (88 mcg total) daily by mouth.  . metFORMIN (GLUCOPHAGE-XR) 500 MG 24 hr tablet TAKE 2 TABLETS TWICE DAILY  . pantoprazole (PROTONIX) 40 MG tablet TAKE 1 TABLET 2 TIMES DAILY BEFORE A MEAL.  Marland Kitchen pneumococcal 23 valent vaccine (PNU-IMMUNE) 25 MCG/0.5ML injection Pneumovax 23 25 mcg/0.5 mL injection solution  inject 0.5 milliliter intramuscularly  . rosuvastatin (CRESTOR) 40 MG tablet TAKE 1 TABLET EVERY DAY  . traZODone (DESYREL) 50 MG tablet Take 1 tablet (50 mg total) daily by mouth.  . [DISCONTINUED] albuterol (PROVENTIL HFA) 108 (90 Base) MCG/ACT inhaler Inhale 2 puffs into the lungs every 6 (six) hours as needed for wheezing or shortness of breath.   Current Facility-Administered Medications for the 03/14/18 encounter (Office Visit) with McLean-Scocuzza, Nino Glow, MD  Medication  . glucose blood test strip STRP 1 each   Allergies  Allergen Reactions  . Codeine Shortness Of Breath    stops breathing  . Meperidine And Related Shortness Of Breath    Stops breathing  . Oxycontin [Oxycodone Hcl] Shortness Of Breath    Stops breathing  . Zetia [Ezetimibe]     Doesn't remember reaction  . Tape Rash    Paper tape ok   No results found for this or any previous visit (from the past 2160 hour(s)). Objective  Body mass index is 27.73 kg/m. Wt Readings from Last 3 Encounters:  03/14/18 149 lb 3.2 oz (67.7 kg)  02/08/18 150 lb 6.4 oz (68.2 kg)  11/22/17 145 lb (65.8 kg)   Temp  Readings from Last 3 Encounters:  03/14/18 98.8 F (37.1 C) (Oral)  02/08/18 98.1 F (36.7 C) (Oral)  11/22/17 98.2 F (36.8 C) (Oral)   BP Readings from Last 3 Encounters:  03/14/18 130/70  02/08/18 118/68  11/22/17 100/60   Pulse Readings from Last 3 Encounters:  03/14/18 94  02/08/18 78  11/22/17 92    Physical  Exam  Constitutional: She is oriented to person, place, and time. Vital signs are normal. She appears well-developed and well-nourished. She is cooperative.  HENT:  Head: Normocephalic and atraumatic.  Mouth/Throat: Mucous membranes are normal. Posterior oropharyngeal erythema present.  Eyes: Pupils are equal, round, and reactive to light. Conjunctivae are normal.  Cardiovascular: Normal rate, regular rhythm and normal heart sounds.  Pulmonary/Chest: Effort normal. She has wheezes.  Neurological: She is alert and oriented to person, place, and time. Gait normal.  Skin: Skin is warm, dry and intact.  Psychiatric: She has a normal mood and affect. Her behavior is normal. Judgment and thought content normal.  Nursing note and vitals reviewed.   Assessment   1. URI vs bronchitis  2. H/o lung nodules and ILD needs repeat CT chest  3. AK nose  Plan  1. zpack  Cold care given handout  duoneb x 1  Prn Al[facility]erol sent [facility]  2. Ordered repeat CT chest  3. Referred dermatology Dr Kellie Moor   Provider: Dr. Olivia Mackie McLean-Scocuzza-Internal Medicine

## 2018-03-14 NOTE — Progress Notes (Signed)
Pre visit review using our clinic review tool, if applicable. No additional management support is needed unless otherwise documented below in the visit note. 

## 2018-03-14 NOTE — Patient Instructions (Addendum)
Hope you feel better  Please let us know if you do not hear from dermatology  Also you need a repeat CT chest we will schedule this  Keep appt with D.r Scott    Cough, Adult Coughing is a reflex that clears your throat and your airways. Coughing helps to heal and protect your lungs. It is normal to cough occasionally, but a cough that happens with other symptoms or lasts a long time may be a sign of a condition that needs treatment. A cough may last only 2-3 weeks (acute), or it may last longer than 8 weeks (chronic). What are the causes? Coughing is commonly caused by:  Breathing in substances that irritate your lungs.  A viral or bacterial respiratory infection.  Allergies.  Asthma.  Postnasal drip.  Smoking.  Acid backing up from the stomach into the esophagus (gastroesophageal reflux).  Certain medicines.  Chronic lung problems, including COPD (or rarely, lung cancer).  Other medical conditions such as heart failure.  Follow these instructions at home: Pay attention to any changes in your symptoms. Take these actions to help with your discomfort:  Take medicines only as told by your health care provider. ? If you were prescribed an antibiotic medicine, take it as told by your health care provider. Do not stop taking the antibiotic even if you start to feel better. ? Talk with your health care provider before you take a cough suppressant medicine.  Drink enough fluid to keep your urine clear or pale yellow.  If the air is dry, use a cold steam vaporizer or humidifier in your bedroom or your home to help loosen secretions.  Avoid anything that causes you to cough at work or at home.  If your cough is worse at night, try sleeping in a semi-upright position.  Avoid cigarette smoke. If you smoke, quit smoking. If you need help quitting, ask your health care provider.  Avoid caffeine.  Avoid alcohol.  Rest as needed.  Contact a health care provider if:  You have  new symptoms.  You cough up pus.  Your cough does not get better after 2-3 weeks, or your cough gets worse.  You cannot control your cough with suppressant medicines and you are losing sleep.  You develop pain that is getting worse or pain that is not controlled with pain medicines.  You have a fever.  You have unexplained weight loss.  You have night sweats. Get help right away if:  You cough up blood.  You have difficulty breathing.  Your heartbeat is very fast. This information is not intended to replace advice given to you by your health care provider. Make sure you discuss any questions you have with your health care provider. Document Released: 04/21/2011 Document Revised: 03/30/2016 Document Reviewed: 12/30/2014 Elsevier Interactive Patient Education  2018 [place].  Upper Respiratory Infection, Adult Most upper respiratory infections (URIs) are caused by a virus. A URI affects the nose, throat, and upper air passages. The most common type of URI is often called "the common cold." Follow these instructions at home:  Take medicines only as told by your doctor.  Gargle warm saltwater or take cough drops to comfort your throat as told by your doctor.  Use a warm mist humidifier or inhale steam from a shower to increase air moisture. This may make it easier to breathe.  Drink enough fluid to keep your pee (urine) clear or pale yellow.  Eat soups and other clear broths.  Have a healthy  diet.  Rest as needed.  Go back to work when your fever is gone or your doctor says it is okay. ? You may need to stay home longer to avoid giving your URI to others. ? You can also wear a face mask and wash your hands often to prevent spread of the virus.  Use your inhaler more if you have asthma.  Do not use any tobacco products, including cigarettes, chewing tobacco, or electronic cigarettes. If you need help quitting, ask your doctor. Contact a doctor if:  You are getting  worse, not better.  Your symptoms are not helped by medicine.  You have chills.  You are getting more short of breath.  You have brown or red mucus.  You have yellow or brown discharge from your nose.  You have pain in your face, especially when you bend forward.  You have a fever.  You have puffy (swollen) neck glands.  You have pain while swallowing.  You have white areas in the back of your throat. Get help right away if:  You have very bad or constant: ? Headache. ? Ear pain. ? Pain in your forehead, behind your eyes, and over your cheekbones (sinus pain). ? Chest pain.  You have long-lasting (chronic) lung disease and any of the following: ? Wheezing. ? Long-lasting cough. ? Coughing up blood. ? A change in your usual mucus.  You have a stiff neck.  You have changes in your: ? Vision. ? Hearing. ? Thinking. ? Mood. This information is not intended to replace advice given to you by your health care provider. Make sure you discuss any questions you have with your health care provider. Document Released: 04/10/2008 Document Revised: 06/25/2016 Document Reviewed: 01/28/2014 Elsevier Interactive Patient Education  2018 [employer].

## 2018-03-19 ENCOUNTER — Telehealth: Payer: Self-pay | Admitting: Internal Medicine

## 2018-03-19 ENCOUNTER — Other Ambulatory Visit: Payer: Self-pay | Admitting: Internal Medicine

## 2018-03-19 DIAGNOSIS — J189 Pneumonia, unspecified organism: Secondary | ICD-10-CM

## 2018-03-19 MED ORDER — DOXYCYCLINE HYCLATE 100 MG PO TABS: 100.0000 mg | ORAL_TABLET | Freq: Two times a day (BID) | ORAL | 0 refills | Status: DC

## 2018-03-19 NOTE — Telephone Encounter (Signed)
Copied from [place] [phone]. Topic: Quick Communication - See Telephone Encounter >> Mar 19, 2018 10:24 AM Vernona Rieger wrote: CRM for notification. See Telephone encounter for: 03/19/18.  Patient states she was in the office on 5/9. Dr Aundra Dubin told her to call back today if she did not feel better. She is still congested, coughing. Please advise. She is using the inhaler but is out of the zpack

## 2018-03-19 NOTE — Telephone Encounter (Signed)
Patient was informed.  Patient understood and no questions, comments, or concerns at this time.  

## 2018-03-19 NOTE — Telephone Encounter (Signed)
Please advise patient still isn't feeling any better

## 2018-03-19 NOTE — Telephone Encounter (Signed)
Please advise 

## 2018-03-19 NOTE — Telephone Encounter (Signed)
Sent doxy bid x 1 week with food   Needs to sch CT chest [facility] please schedule

## 2018-03-20 NOTE — Telephone Encounter (Signed)
She is scheduled for chest ct on 5/16

## 2018-03-21 ENCOUNTER — Telehealth: Payer: Self-pay | Admitting: Internal Medicine

## 2018-03-21 ENCOUNTER — Ambulatory Visit
Admission: RE | Admit: 2018-03-21 | Discharge: 2018-03-21 | Disposition: A | Discharge status: Home / Self Care | Payer: Medicare HMO | Source: Ambulatory Visit | Attending: Internal Medicine | Admitting: Internal Medicine

## 2018-03-21 DIAGNOSIS — J849 Interstitial pulmonary disease, unspecified: Secondary | ICD-10-CM

## 2018-03-21 DIAGNOSIS — R918 Other nonspecific abnormal finding of lung field: Secondary | ICD-10-CM

## 2018-03-21 NOTE — Telephone Encounter (Signed)
Copied from [place] [phone]. Topic: Quick Communication - See Telephone Encounter >> Mar 21, 2018  2:47 PM Rutherford Nail, NT wrote: CRM for notification. See Telephone encounter for: 03/21/18. Rikel calling from [facility] and states that they sent a fax on 5/8 and 5/10 about getting a prescription for diabetic testing supplies. Please advise. CB#: [phone]

## 2018-03-21 NOTE — Telephone Encounter (Signed)
Please advise 

## 2018-03-26 ENCOUNTER — Telehealth: Payer: Self-pay | Admitting: Internal Medicine

## 2018-03-26 NOTE — Telephone Encounter (Unsigned)
Copied from [place] [phone]. Topic: Quick Communication - Rx Refill/Question >> Mar 26, 2018  5:07 PM Yvette Rack wrote: Medication: levothyroxine (SYNTHROID, LEVOTHROID) 88 MCG tablet  Preferred Pharmacy (with phone number or street name): [place], [place] [phone] (Phone) [phone] (Fax)   Agent: Please be advised that RX refills may take up to 3 business days. We ask that you follow-up with your pharmacy.

## 2018-03-27 ENCOUNTER — Other Ambulatory Visit: Payer: Self-pay | Admitting: *Deleted

## 2018-03-27 MED ORDER — LEVOTHYROXINE SODIUM 88 MCG PO TABS: 88.0000 ug | ORAL_TABLET | Freq: Every day | ORAL | 0 refills | Status: DC

## 2018-03-27 NOTE — Telephone Encounter (Signed)
Refill of levothyroxine - with no additional- patient has appointment 04/09/18

## 2018-03-28 NOTE — Telephone Encounter (Signed)
Called [employer] to let them know that pt is using a different company to get diabetic supplies

## 2018-04-09 ENCOUNTER — Encounter: Payer: Self-pay | Admitting: Internal Medicine

## 2018-04-09 ENCOUNTER — Encounter

## 2018-04-09 ENCOUNTER — Ambulatory Visit (INDEPENDENT_AMBULATORY_CARE_PROVIDER_SITE_OTHER): Payer: Medicare HMO | Admitting: Internal Medicine

## 2018-04-09 VITALS — BP 118/72 | HR 78 | Temp 97.8°F | Resp 18 | Ht 62.0 in | Wt 150.2 lb

## 2018-04-09 DIAGNOSIS — I700 Atherosclerosis of aorta: Secondary | ICD-10-CM | POA: Diagnosis not present

## 2018-04-09 DIAGNOSIS — D473 Essential (hemorrhagic) thrombocythemia: Secondary | ICD-10-CM | POA: Diagnosis not present

## 2018-04-09 DIAGNOSIS — E119 Type 2 diabetes mellitus without complications: Secondary | ICD-10-CM | POA: Diagnosis not present

## 2018-04-09 DIAGNOSIS — E039 Hypothyroidism, unspecified: Secondary | ICD-10-CM | POA: Diagnosis not present

## 2018-04-09 DIAGNOSIS — Z0000 Encounter for general adult medical examination without abnormal findings: Secondary | ICD-10-CM

## 2018-04-09 DIAGNOSIS — E1159 Type 2 diabetes mellitus with other circulatory complications: Secondary | ICD-10-CM | POA: Diagnosis not present

## 2018-04-09 DIAGNOSIS — R9389 Abnormal findings on diagnostic imaging of other specified body structures: Secondary | ICD-10-CM | POA: Diagnosis not present

## 2018-04-09 DIAGNOSIS — E7800 Pure hypercholesterolemia, unspecified: Secondary | ICD-10-CM

## 2018-04-09 DIAGNOSIS — I6523 Occlusion and stenosis of bilateral carotid arteries: Secondary | ICD-10-CM | POA: Diagnosis not present

## 2018-04-09 DIAGNOSIS — F32A Depression, unspecified: Secondary | ICD-10-CM

## 2018-04-09 DIAGNOSIS — F329 Major depressive disorder, single episode, unspecified: Secondary | ICD-10-CM | POA: Diagnosis not present

## 2018-04-09 DIAGNOSIS — D75839 Thrombocytosis, unspecified: Secondary | ICD-10-CM

## 2018-04-09 DIAGNOSIS — K219 Gastro-esophageal reflux disease without esophagitis: Secondary | ICD-10-CM | POA: Diagnosis not present

## 2018-04-09 LAB — HM DIABETES FOOT EXAM

## 2018-04-09 MED ORDER — ONETOUCH ULTRASOFT LANCETS MISC: 12 refills | Status: DC

## 2018-04-09 MED ORDER — GLUCOSE BLOOD VI STRP: ORAL_STRIP | 12 refills | Status: DC

## 2018-04-09 NOTE — Assessment & Plan Note (Addendum)
Physical today 04/09/18.  Mammogram 09/26/17 - Birads I.  She declines colonoscopy.

## 2018-04-09 NOTE — Progress Notes (Signed)
Patient ID: ERNESTA TRABERT, female   DOB: 04-03-1946, 72 y.o.   MRN: [id number]    Subjective:    Patient ID: ZARIE KOSIBA, female    DOB: 31-Jan-1946, 72 y.o.   MRN: [id number]  HPI  Patient here for her physical exam.  She is accompanied by her son Joneen Caraway.  History obtained from both of them.  She reports she is doing relatively well.  Breathing stable.  Cough is better.  She quit working the [employer] 01/09/18.  States she feels better since stopping.  No chest pain.  No acid reflux.  No abdominal pain.  Bowels moving.  No urine change.  Sugars still running a little high.  Discussed diet and exercise.  We discussed the importance of eating regular meals.  Discussed low carb diet and exercise.  Knees limit her walking.     Past Medical History:  Diagnosis Date  . Depression   . Diabetes mellitus without complication ([place])   . GERD (gastroesophageal reflux disease)   . Heart murmur    slight - followed by PCP  . Hypercholesterolemia   . Hypothyroidism   . Osteoarthritis of knee   . Wears dentures    partial lower   Past Surgical History:  Procedure Laterality Date  . ABDOMINAL HYSTERECTOMY  1985   ovaries not removed  . BREAST CYST EXCISION Right   . BREAST CYST EXCISION Left   . CATARACT EXTRACTION W/PHACO Right 02/16/2016   Procedure: CATARACT EXTRACTION PHACO AND INTRAOCULAR LENS PLACEMENT ([place]) RIGHT ;  Surgeon: Leandrew Koyanagi, MD;  Location: [place];  Service: Ophthalmology;  Laterality: Right;  DIABETIC - oral meds  . CATARACT EXTRACTION W/PHACO Left 03/15/2016   Procedure: CATARACT EXTRACTION PHACO AND INTRAOCULAR LENS PLACEMENT ([facility])left eye;  Surgeon: Leandrew Koyanagi, MD;  Location: [place];  Service: Ophthalmology;  Laterality: Left;  DIABETIC - oral meds  . INNER EAR SURGERY     Family History  Problem Relation Age of Onset  . Diabetes Father   . Coronary artery disease Father        s/p CABG  . Hypertension Father   .  Hypercholesterolemia Father   . Hypertension Brother   . Diabetes Brother   . Hypertension Sister   . Glaucoma Sister   . Diabetes Unknown        niece  . Breast cancer Maternal Aunt   . Breast cancer Paternal Aunt   . Cervical cancer Paternal Aunt   . Breast cancer Cousin   . Breast cancer Other    Social History   Socioeconomic History  . Marital status: Widowed    Spouse name: Not on file  . Number of children: 3  . Years of education: Not on file  . Highest education level: Not on file  Occupational History  . Not on file  Social Needs  . Financial resource strain: Not hard at all  . Food insecurity:    Worry: Never true    Inability: Never true  . Transportation needs:    Medical: No    Non-medical: No  Tobacco Use  . Smoking status: Former Smoker    Packs/day: 1.00    Years: 23.00    Pack years: 23.00    Last attempt to quit: 11/20/1983    Years since quitting: 34.4  . Smokeless tobacco: Never Used  Substance and Sexual Activity  . Alcohol use: Yes    Alcohol/week: 0.0 oz    Comment: rare  . Drug  use: No  . Sexual activity: Never  Lifestyle  . Physical activity:    Days per week: Not on file    Minutes per session: Not on file  . Stress: Not on file  Relationships  . Social connections:    Talks on phone: Not on file    Gets together: Not on file    Attends religious service: Not on file    Active member of club or organization: Not on file    Attends meetings of clubs or organizations: Not on file    Relationship status: Not on file  Other Topics Concern  . Not on file  Social History Narrative  . Not on file    Outpatient Encounter Medications as of 04/09/2018  Medication Sig  . albuterol (PROVENTIL HFA) 108 (90 Base) MCG/ACT inhaler Inhale 2 puffs into the lungs every 6 (six) hours as needed for wheezing or shortness of breath.  . cholecalciferol (VITAMIN D) 1000 units tablet Take 1,000 Units by mouth daily.  . cyanocobalamin 1000 MCG tablet  Take 1,000 mcg by mouth daily.  . diclofenac (VOLTAREN) 50 MG EC tablet Take 1 tablet (50 mg total) by mouth 2 (two) times daily as needed.  Marland Kitchen FLUoxetine (PROZAC) 20 MG capsule Take 1 capsule (20 mg total) daily by mouth.  . fluticasone (FLONASE) 50 MCG/ACT nasal spray Place 2 sprays into both nostrils daily.  Marland Kitchen glucose blood (ONETOUCH VERIO) test strip Use as instructed to check blood sugars twice daily. Dx: E 11.9  . Lancets (ONETOUCH ULTRASOFT) lancets Use as instructed to check sugars twice daily. Dx: E 11.9  . levocetirizine (XYZAL) 5 MG tablet TAKE 1 TABLET EVERY DAY  . levothyroxine (SYNTHROID, LEVOTHROID) 88 MCG tablet Take 1 tablet (88 mcg total) by mouth daily.  . metFORMIN (GLUCOPHAGE-XR) 500 MG 24 hr tablet TAKE 2 TABLETS TWICE DAILY  . pantoprazole (PROTONIX) 40 MG tablet TAKE 1 TABLET 2 TIMES DAILY BEFORE A MEAL.  . rosuvastatin (CRESTOR) 40 MG tablet TAKE 1 TABLET EVERY DAY  . traZODone (DESYREL) 50 MG tablet Take 1 tablet (50 mg total) daily by mouth.  . [DISCONTINUED] azithromycin (ZITHROMAX) 250 MG tablet 2 pills day 1 and pill day 2-5  . [DISCONTINUED] doxycycline (VIBRA-TABS) 100 MG tablet Take 1 tablet (100 mg total) by mouth 2 (two) times daily. With food  . [DISCONTINUED] glipiZIDE (GLIPIZIDE XL) 5 MG 24 hr tablet Take 1 tablet (5 mg total) daily with breakfast by mouth.  . [DISCONTINUED] glucose blood (ONETOUCH VERIO) test strip Use as instructed  . [DISCONTINUED] pneumococcal 23 valent vaccine (PNU-IMMUNE) 25 MCG/0.5ML injection Pneumovax 23 25 mcg/0.5 mL injection solution  inject 0.5 milliliter intramuscularly   Facility-Administered Encounter Medications as of 04/09/2018  Medication  . glucose blood test strip STRP 1 each    Review of Systems  Constitutional: Negative for appetite change and unexpected weight change.  HENT: Negative for congestion and sinus pressure.   Eyes: Negative for pain and visual disturbance.  Respiratory: Negative for cough, chest  tightness and shortness of breath.   Cardiovascular: Negative for chest pain, palpitations and leg swelling.  Gastrointestinal: Negative for abdominal pain, diarrhea, nausea and vomiting.  Genitourinary: Negative for difficulty urinating and dysuria.  Musculoskeletal: Negative for myalgias.       Knee pain as outlined.    Skin: Negative for color change and rash.  Neurological: Negative for dizziness, light-headedness and headaches.  Hematological: Negative for adenopathy. Does not bruise/bleed easily.  Psychiatric/Behavioral: Negative for agitation and dysphoric  mood.       Objective:    Physical Exam  Constitutional: She is oriented to person, place, and time. She appears well-developed and well-nourished. No distress.  HENT:  Nose: Nose normal.  Mouth/Throat: Oropharynx is clear and moist.  Eyes: Right eye exhibits no discharge. Left eye exhibits no discharge. No scleral icterus.  Neck: Neck supple. No thyromegaly present.  Cardiovascular: Normal rate and regular rhythm.  Pulmonary/Chest: Breath sounds normal. No accessory muscle usage. No tachypnea. No respiratory distress. She has no decreased breath sounds. She has no wheezes. She has no rhonchi. Right breast exhibits no inverted nipple, no mass, no nipple discharge and no tenderness (no axillary adenopathy). Left breast exhibits no inverted nipple, no mass, no nipple discharge and no tenderness (no axilarry adenopathy).  Abdominal: Soft. Bowel sounds are normal. There is no tenderness.  Musculoskeletal: She exhibits no edema or tenderness.  Feet:  No lesions.  Intact to light touch and pin prick.  DP pulses palpable and equal bilaterally.    Lymphadenopathy:    She has no cervical adenopathy.  Neurological: She is alert and oriented to person, place, and time.  Skin: No rash noted. No erythema.  Psychiatric: She has a normal mood and affect. Her behavior is normal.    BP 118/72 (BP Location: Left Arm, Patient Position:  Sitting, Cuff Size: Normal)   Pulse 78   Temp 97.8 F (36.6 C) (Oral)   Resp 18   Ht 5' 2"  (1.575 m)   Wt 150 lb 3.2 oz (68.1 kg)   SpO2 97%   BMI 27.47 kg/m  Wt Readings from Last 3 Encounters:  04/09/18 150 lb 3.2 oz (68.1 kg)  03/14/18 149 lb 3.2 oz (67.7 kg)  02/08/18 150 lb 6.4 oz (68.2 kg)     Lab Results  Component Value Date   WBC 8.1 12/27/2016   HGB 12.1 12/27/2016   HCT 37.2 12/27/2016   PLT 388 01/19/2017   GLUCOSE 176 (H) 07/25/2017   CHOL 125 07/25/2017   TRIG 120.0 07/25/2017   HDL 57.10 07/25/2017   LDLDIRECT 188.8 06/30/2013   LDLCALC 44 07/25/2017   ALT 13 07/25/2017   AST 13 07/25/2017   NA 138 07/25/2017   K 4.4 07/25/2017   CL 101 07/25/2017   CREATININE 0.82 07/25/2017   BUN 16 07/25/2017   CO2 27 07/25/2017   TSH 1.96 12/27/2016   HGBA1C 7.7 (H) 07/25/2017   MICROALBUR 1.5 01/10/2016       Assessment & Plan:   Problem List Items Addressed This Visit    Abnormal CT of the chest    Saw Dr Genevive Bi.  Continue f/u with Dr Genevive Bi.        Aortic atherosclerosis ([place])    On crestor.        Carotid stenosis    Discussed the need for f/u with vascular surgery.  Has seen Dr Lucky Cowboy.  Continue aspirin.  Continue crestor.  Will schedule f/u with vascular surgery when able.        Depression    Stable on prozac.        Diabetes mellitus ([place])    Low carb diet and exercise.  Due labs.  Schedule.  Check met b and a1c.  Have discussed the need for eye exam       GERD (gastroesophageal reflux disease)    Controlled on protonix.        Health care maintenance    Physical today 04/09/18.  Mammogram 09/26/17 -  Birads I.  She declines colonoscopy.        Hypercholesterolemia    On crestor.  Low cholesterol diet and exercise.  Follow lipid panel and liver function tests.        Hypothyroidism    On thyroid replacement.  Follow tsh.       Thrombocytosis (HCC)    Platelet count slightly elevated.  Recheck cbc.         Other Visit Diagnoses     Routine general medical examination at a health care facility    -  Primary       Einar Pheasant, MD

## 2018-04-10 ENCOUNTER — Other Ambulatory Visit: Payer: Self-pay | Admitting: Internal Medicine

## 2018-04-14 ENCOUNTER — Encounter: Payer: Self-pay | Admitting: Internal Medicine

## 2018-04-14 DIAGNOSIS — D75839 Thrombocytosis, unspecified: Secondary | ICD-10-CM | POA: Insufficient documentation

## 2018-04-14 DIAGNOSIS — D473 Essential (hemorrhagic) thrombocythemia: Secondary | ICD-10-CM | POA: Insufficient documentation

## 2018-04-14 NOTE — Assessment & Plan Note (Signed)
Stable on prozac.  

## 2018-04-14 NOTE — Assessment & Plan Note (Signed)
On [facility].   

## 2018-04-14 NOTE — Assessment & Plan Note (Signed)
Discussed the need for f/u with vascular surgery.  Has seen Dr Lucky Cowboy.  Continue aspirin.  Continue crestor.  Will schedule f/u with vascular surgery when able.

## 2018-04-14 NOTE — Assessment & Plan Note (Signed)
Saw Dr Genevive Bi.  Continue f/u with Dr Genevive Bi.

## 2018-04-14 NOTE — Assessment & Plan Note (Signed)
On crestor.  Low cholesterol diet and exercise.  Follow lipid panel and liver function tests.   

## 2018-04-14 NOTE — Assessment & Plan Note (Signed)
Low carb diet and exercise.  Due labs.  Schedule.  Check met b and a1c.  Have discussed the need for eye exam

## 2018-04-14 NOTE — Progress Notes (Deleted)
The Cardiology Office Note  Date:  04/14/2018   ID:  Jody Wang, DOB Oct 28, 1946, MRN [id number]  PCP:  Einar Pheasant, MD   No chief complaint on file.   HPI:  Jody Wang is a pleasant 72 year old woman with history of Smoking, 23 years, quit in 1985 Diabetes Hyperlipidemia Carotid stenosis,  60-79% range on the left right carotid artery stenosis 40-59% range. Previously seen by Dr Genevive Bi for abnormal chest CT,  lung nodule Who presents by referral from Dr. Nicki Reaper for evaluation/consultation of her shortness of breath and chest tightness  She reports that several months ago she had bronchitis This caused significant shortness of breath and cough She feels the cough and bronchitis causing some chest tightness Now that her bronchitis has resolved, she has had no further episodes of chest pain Reports that she is very active currently with no reproducible symptoms concerning for angina  Last weekend reports that she cleaned her whole house, heavy work, had no symptoms of chest discomfort  Still coughing up little bits of yellow and green in the mornings but overall better  If she walks long distances he does have SOB on exertion, feels this has been a chronic issue Chronic Knee pain, Walks the dog, but limited by her knees  CT scan chest April 2018 pulled up in the office, Images reviewed with her in detail from the carotids down to the top of the kidneys Aortic atherosclerosis in the aorta and coronary arteries shown to her in detail  EKG personally reviewed by myself on todays visit Shows normal sinus rhythm with rate 71 bpm no significant ST or T-wave changes   PMH:   has a past medical history of Depression, Diabetes mellitus without complication ([place]), GERD (gastroesophageal reflux disease), Heart murmur, Hypercholesterolemia, Hypothyroidism, Osteoarthritis of knee, and Wears dentures.  PSH:    Past Surgical History:  Procedure Laterality Date  . ABDOMINAL  HYSTERECTOMY  1985   ovaries not removed  . BREAST CYST EXCISION Right   . BREAST CYST EXCISION Left   . CATARACT EXTRACTION W/PHACO Right 02/16/2016   Procedure: CATARACT EXTRACTION PHACO AND INTRAOCULAR LENS PLACEMENT ([place]) RIGHT ;  Surgeon: Leandrew Koyanagi, MD;  Location: [place];  Service: Ophthalmology;  Laterality: Right;  DIABETIC - oral meds  . CATARACT EXTRACTION W/PHACO Left 03/15/2016   Procedure: CATARACT EXTRACTION PHACO AND INTRAOCULAR LENS PLACEMENT ([facility])left eye;  Surgeon: Leandrew Koyanagi, MD;  Location: [place];  Service: Ophthalmology;  Laterality: Left;  DIABETIC - oral meds  . INNER EAR SURGERY      Current Outpatient Medications  Medication Sig Dispense Refill  . albuterol (PROVENTIL HFA) 108 (90 Base) MCG/ACT inhaler Inhale 2 puffs into the lungs every 6 (six) hours as needed for wheezing or shortness of breath. 1 Inhaler 0  . cholecalciferol (VITAMIN D) 1000 units tablet Take 1,000 Units by mouth daily.    . cyanocobalamin 1000 MCG tablet Take 1,000 mcg by mouth daily.    . diclofenac (VOLTAREN) 50 MG EC tablet Take 1 tablet (50 mg total) by mouth 2 (two) times daily as needed. 180 tablet 0  . FLUoxetine (PROZAC) 20 MG capsule Take 1 capsule (20 mg total) daily by mouth. 90 capsule 1  . fluticasone (FLONASE) 50 MCG/ACT nasal spray Place 2 sprays into both nostrils daily. 48 g 3  . glipiZIDE (GLUCOTROL XL) 5 MG 24 hr tablet TAKE 1 TABLET EVERY DAY WITH BREAKFAST 90 tablet 1  . glucose blood (ONETOUCH VERIO) test strip  Use as instructed to check blood sugars twice daily. Dx: E 11.9 100 each 12  . Lancets (ONETOUCH ULTRASOFT) lancets Use as instructed to check sugars twice daily. Dx: E 11.9 100 each 12  . levocetirizine (XYZAL) 5 MG tablet TAKE 1 TABLET EVERY DAY 90 tablet 1  . levothyroxine (SYNTHROID, LEVOTHROID) 88 MCG tablet Take 1 tablet (88 mcg total) by mouth daily. 90 tablet 0  . metFORMIN (GLUCOPHAGE-XR) 500 MG 24 hr tablet TAKE 2  TABLETS TWICE DAILY 360 tablet 1  . pantoprazole (PROTONIX) 40 MG tablet TAKE 1 TABLET 2 TIMES DAILY BEFORE A MEAL. 180 tablet 1  . rosuvastatin (CRESTOR) 40 MG tablet TAKE 1 TABLET EVERY DAY 30 tablet 0  . traZODone (DESYREL) 50 MG tablet Take 1 tablet (50 mg total) daily by mouth. 90 tablet 0   Current Facility-Administered Medications  Medication Dose Route Frequency Provider Last Rate Last Dose  . glucose blood test strip STRP 1 each  1 each Other PRN Einar Pheasant, MD         Allergies:   Codeine; Meperidine and related; Oxycontin [oxycodone hcl]; Zetia [ezetimibe]; and Tape   Social History:  The patient  reports that she quit smoking about 34 years ago. She has a 23.00 pack-year smoking history. She has never used smokeless tobacco. She reports that she drinks alcohol. She reports that she does not use drugs.   Family History:   family history includes Breast cancer in her cousin, maternal aunt, other, and paternal aunt; Cervical cancer in her paternal aunt; Coronary artery disease in her father; Diabetes in her brother, father, and unknown relative; Glaucoma in her sister; Hypercholesterolemia in her father; Hypertension in her brother, father, and sister.    Review of Systems: Review of Systems  Constitutional: Negative.   Respiratory: Positive for shortness of breath.   Cardiovascular: Negative.   Gastrointestinal: Negative.   Musculoskeletal: Negative.   Neurological: Negative.   Psychiatric/Behavioral: Negative.   All other systems reviewed and are negative.    PHYSICAL EXAM: VS:  There were no vitals taken for this visit. , BMI There is no height or weight on file to calculate BMI. GEN: Well nourished, well developed, in no acute distress  HEENT: normal  Neck: no JVD, carotid bruits, or masses Cardiac: RRR; no murmurs, rubs, or gallops,no edema  Respiratory:  clear to auscultation bilaterally, normal work of breathing GI: soft, nontender, nondistended, + BS MS:  no deformity or atrophy  Skin: warm and dry, no rash Neuro:  Strength and sensation are intact Psych: euthymic mood, full affect    Recent Labs: 07/25/2017: ALT 13; BUN 16; Creatinine, Ser 0.82; Potassium 4.4; Sodium 138    Lipid Panel Lab Results  Component Value Date   CHOL 125 07/25/2017   HDL 57.10 07/25/2017   LDLCALC 44 07/25/2017   TRIG 120.0 07/25/2017      Wt Readings from Last 3 Encounters:  04/09/18 150 lb 3.2 oz (68.1 kg)  03/14/18 149 lb 3.2 oz (67.7 kg)  02/08/18 150 lb 6.4 oz (68.2 kg)       ASSESSMENT AND PLAN:  SOB (shortness of breath) on exertion - Plan: EKG 12-Lead Likely multifactorial including deconditioning, COPD Unable to exclude ischemia If symptoms get worse she will call us for stress testing  Chest pain, unspecified type - Plan: EKG 12-Lead Reports having remote chest pain in the setting of bronchitis, none recently. We discussed symptoms of angina and recommended she call our office if she has any  chest tightness on exertion or worsening shortness of breath  Type 2 diabetes mellitus with other circulatory complication, without long-term current use of insulin (HCC) - Plan: EKG 12-Lead Hemoglobin A1c greater than 7 We have encouraged continued exercise, careful diet management in an effort to lose weight.  Bilateral carotid artery stenosis - Plan: EKG 12-Lead Followed by Dr. Lucky Cowboy Changing cholesterol medication, goal LDL less than 70 to limit progression  Hypercholesterolemia - Plan: EKG 12-Lead Recommended she stop her pravastatin, start Crestor 40 mg daily Goal LDL less than 70  Coronary artery disease of native artery of native heart with stable angina pectoris ([place]) Diffuse three-vessel coronary calcification seen on CT scan chest She denies any chest pain concerning for angina As she is asymptomatic, she does not feel that she needs any further testing We did discuss that shortness of breath can be a symptom of angina and  recommended she call our office if symptoms get worse.  Aortic atherosclerosis ([place]) seen on CT scan chest April 2018 Moderate diffuse disease Images show during detail, recommended aggressive cholesterol regiment Goal LDL less than 70   Total encounter time more than  60 minutes  Greater than 50% was spent in counseling and coordination of care with the patient    Disposition:   F/U  6 months   No orders of the defined types were placed in this encounter.    Signed, Esmond Plants, M.D., Ph.D. 04/14/2018  [place], [place]

## 2018-04-14 NOTE — Assessment & Plan Note (Signed)
On thyroid replacement.  Follow tsh.  

## 2018-04-14 NOTE — Assessment & Plan Note (Signed)
Platelet count slightly elevated.  Recheck cbc.

## 2018-04-14 NOTE — Assessment & Plan Note (Signed)
Controlled on protonix.   

## 2018-04-15 ENCOUNTER — Ambulatory Visit: Payer: Medicare HMO | Admitting: Cardiovascular Disease

## 2018-04-18 ENCOUNTER — Other Ambulatory Visit (INDEPENDENT_AMBULATORY_CARE_PROVIDER_SITE_OTHER): Payer: Medicare HMO

## 2018-04-18 DIAGNOSIS — E119 Type 2 diabetes mellitus without complications: Secondary | ICD-10-CM | POA: Diagnosis not present

## 2018-04-18 DIAGNOSIS — E7800 Pure hypercholesterolemia, unspecified: Secondary | ICD-10-CM

## 2018-04-18 DIAGNOSIS — D473 Essential (hemorrhagic) thrombocythemia: Secondary | ICD-10-CM | POA: Diagnosis not present

## 2018-04-18 DIAGNOSIS — E1159 Type 2 diabetes mellitus with other circulatory complications: Secondary | ICD-10-CM | POA: Diagnosis not present

## 2018-04-18 DIAGNOSIS — D75839 Thrombocytosis, unspecified: Secondary | ICD-10-CM

## 2018-04-18 LAB — MICROALBUMIN / CREATININE URINE RATIO
CREATININE, U: 77.7 mg/dL
MICROALB/CREAT RATIO: 1.3 mg/g (ref 0.0–30.0)
Microalb, Ur: 1 mg/dL (ref 0.0–1.9)

## 2018-04-18 LAB — BASIC METABOLIC PANEL
BUN: 16 mg/dL (ref 6–23)
CALCIUM: 9.3 mg/dL (ref 8.4–10.5)
CHLORIDE: 102 meq/L (ref 96–112)
CO2: 26 mEq/L (ref 19–32)
Creatinine, Ser: 0.84 mg/dL (ref 0.40–1.20)
GFR: 70.73 mL/min (ref >60.00)
Glucose, Bld: 193 mg/dL — ABNORMAL HIGH (ref 70–99)
POTASSIUM: 5 meq/L (ref 3.5–5.1)
SODIUM: 137 meq/L (ref 135–145)

## 2018-04-18 LAB — CBC WITH DIFFERENTIAL/PLATELET
BASOS ABS: 0 10*3/uL (ref 0.0–0.1)
Basophils Relative: 0.3 % (ref 0.0–3.0)
EOS ABS: 0.6 10*3/uL (ref 0.0–0.7)
Eosinophils Relative: 6.9 % — ABNORMAL HIGH (ref 0.0–5.0)
HEMATOCRIT: 37.2 % (ref 36.0–46.0)
Hemoglobin: 12.2 g/dL (ref 12.0–15.0)
LYMPHS PCT: 26.7 % (ref 12.0–46.0)
Lymphs Abs: 2.5 10*3/uL (ref 0.7–4.0)
MCHC: 32.8 g/dL (ref 30.0–36.0)
MCV: 81.4 fl (ref 78.0–100.0)
Monocytes Absolute: 0.6 10*3/uL (ref 0.1–1.0)
Monocytes Relative: 6.2 % (ref 3.0–12.0)
NEUTROS PCT: 59.9 % (ref 43.0–77.0)
Neutro Abs: 5.6 10*3/uL (ref 1.4–7.7)
Platelets: 417 10*3/uL — ABNORMAL HIGH (ref 150.0–400.0)
RBC: 4.57 Mil/uL (ref 3.87–5.11)
RDW: 15.9 % — ABNORMAL HIGH (ref 11.5–15.5)
WBC: 9.3 10*3/uL (ref 4.0–10.5)

## 2018-04-18 LAB — LIPID PANEL
CHOL/HDL RATIO: 3
Cholesterol: 129 mg/dL (ref 0–200)
HDL: 40.2 mg/dL (ref >39.00)
LDL CALC: 63 mg/dL (ref 0–99)
NonHDL: 88.42
TRIGLYCERIDES: 128 mg/dL (ref 0.0–149.0)
VLDL: 25.6 mg/dL (ref 0.0–40.0)

## 2018-04-18 LAB — HEPATIC FUNCTION PANEL
ALBUMIN: 4.5 g/dL (ref 3.5–5.2)
ALK PHOS: 67 U/L (ref 39–117)
ALT: 15 U/L (ref 0–35)
AST: 15 U/L (ref 0–37)
Bilirubin, Direct: 0.1 mg/dL (ref 0.0–0.3)
TOTAL PROTEIN: 7.4 g/dL (ref 6.0–8.3)
Total Bilirubin: 0.2 mg/dL (ref 0.2–1.2)

## 2018-04-18 LAB — HEMOGLOBIN A1C: HEMOGLOBIN A1C: 8.9 % — AB (ref 4.6–6.5)

## 2018-04-19 ENCOUNTER — Other Ambulatory Visit: Payer: Self-pay | Admitting: Internal Medicine

## 2018-04-19 DIAGNOSIS — D75839 Thrombocytosis, unspecified: Secondary | ICD-10-CM

## 2018-04-19 DIAGNOSIS — D473 Essential (hemorrhagic) thrombocythemia: Secondary | ICD-10-CM

## 2018-04-19 NOTE — Progress Notes (Signed)
Order placed for f/u platelet count check.

## 2018-04-22 ENCOUNTER — Encounter: Payer: Self-pay | Admitting: Pharmacist

## 2018-04-22 ENCOUNTER — Ambulatory Visit (INDEPENDENT_AMBULATORY_CARE_PROVIDER_SITE_OTHER): Payer: Medicare HMO | Admitting: Pharmacist

## 2018-04-22 VITALS — BP 118/73 | HR 94 | Ht 61.0 in | Wt 154.0 lb

## 2018-04-22 DIAGNOSIS — E1159 Type 2 diabetes mellitus with other circulatory complications: Secondary | ICD-10-CM | POA: Diagnosis not present

## 2018-04-22 MED ORDER — EMPAGLIFLOZIN 10 MG PO TABS: 10.0000 mg | ORAL_TABLET | Freq: Every day | ORAL | 3 refills | Status: DC

## 2018-04-22 NOTE — Assessment & Plan Note (Signed)
#  ASCVD risk - secondary prevention in patient aged 73-75 with DM, baseline LDL 70-189, stable ASCVD - high intensity statin indicated.  - Consider initiation of Aspirin 81 mg  - Continued rosuvastatin 40 mg.

## 2018-04-22 NOTE — Progress Notes (Addendum)
S:     Chief Complaint  Patient presents with  . Medication Management    diabetes    Patient arrives 14 minutes late for appointment, ambulating without assistance.   Presents for diabetes evaluation, education, and management at the request of Dr. Nicki Reaper (referred on 04/18/18 - lab note). Last seen by primary care provider on 04/18/18 - at that time A1C found to be elevated to 8.9% and glipizide XL was started.  Patient reports Diabetes was diagnosed in 1998. Used to be on Januvia but had to d/c due to cost (3-4 years ago). Had been on glipizide previously but had an episode of hypoglycemia and it was discontinued.   Family/Social History: Father, son and brother with diabetes, both parents deceased from heart failure and had HTN and HLD.  Insurance coverage/medication affordability: [employer] medicare advantage plan.   Patient reports adherence with medications. "A- student" Current diabetes medications include: glipizide xl 5 mg daily, metformin XR 1000 mg BID Current hypertension medications include: none  Patient denies hypoglycemic events.  Patient reported dietary habits: Eats 3 meals/day Breakfast:skips or has bacon and eggs Lunch:hamburger "all the way" and tater tots Dinner: left over pork roast, carrots, potato Snacks:fruit Drinks:diet pepsi, orange juice (2-3 glasses/week), V8 juice daily  Patient reported exercise habits: walk dog around the yard, limited by arthritis   Patient reports nocturia. 2x/night Patient denies pain/burning on urination.  Patient denies neuropathy. Patient denies visual changes. Patient denies self foot exams.  Denies issues however.   O:  Physical Exam  Constitutional: She appears well-developed and well-nourished.     Review of Systems  All other systems reviewed and are negative.    Lab Results  Component Value Date   HGBA1C 8.9 (H) 04/18/2018   Vitals:   04/22/18 1520  BP: 118/73  Pulse: 94    Lipid Panel       Component Value Date/Time   CHOL 129 04/18/2018 1059   TRIG 128.0 04/18/2018 1059   HDL 40.20 04/18/2018 1059   CHOLHDL 3 04/18/2018 1059   VLDL 25.6 04/18/2018 1059   LDLCALC 63 04/18/2018 1059   LDLDIRECT 188.8 06/30/2013 1131    Home fasting CBG: 170s-200s  Clinical ASCVD: Yes ; High-risk conditions: Age 73 or older, DM  A/P: #Diabetes longstanding currently uncontrolled as evidenced by fasting CBGs and A1C. Patient denies hypoglycemic events and is able to verbalize appropriate hypoglycemia management plan. Patient reports adherence with medication. Control is suboptimal due to dietary indiscretion, sedentary lifestyle, pancreatic insufficiency - Continue metformin XR 1000 mg BID and glipizide XL 5 mg daily - Start Jardiance 10 mg daily. Patient educated on purpose, proper use and potential adverse effects of hypotension, UTI/yeast infection, and euglycemic DKA.  Following instruction patient verbalized understanding of treatment plan. Counseled on sick day rules for Jardiance -Reviewed basic pathophysiology of DM and MOA of medications. Discussed dietary modification in detail. - Next A1C anticipated 07/19/2018 or later.    #ASCVD risk - secondary prevention in patient aged 75-75 with DM, baseline LDL 70-189, stable ASCVD - high intensity statin indicated.  - Consider initiation of Aspirin 81 mg  - Continued rosuvastatin 40 mg.   #Medication access - patient with [employer] MA plan. No subsidy at present. Not in donut hole. Endorses some issues with affordability.  -Applied for LIS/Extra help today -Started patient assistance program application for [employer].   Written patient instructions provided.  Total time in face to face counseling 40 minutes.    Follow up in  Pharmacist Clinic Visit 4-6 weeks.  Carlean Jews, Pharm.D., BCPS, CPP PGY2 Ambulatory Care Pharmacy Resident Phone: [phone]   Reviewed above information.  Agree with assessment and plan.    Dr Nicki Reaper

## 2018-04-22 NOTE — Assessment & Plan Note (Signed)
#  Diabetes longstanding currently uncontrolled as evidenced by fasting CBGs and A1C. Patient denies hypoglycemic events and is able to verbalize appropriate hypoglycemia management plan. Patient reports adherence with medication. Control is suboptimal due to dietary indiscretion, sedentary lifestyle, pancreatic insufficiency - Continue metformin XR 1000 mg BID and glipizide XL 5 mg daily - Start Jardiance 10 mg daily. Patient educated on purpose, proper use and potential adverse effects of hypotension, UTI/yeast infection, and euglycemic DKA.  Following instruction patient verbalized understanding of treatment plan. Counseled on sick day rules for Jardiance -Reviewed basic pathophysiology of DM and MOA of medications. Discussed dietary modification in detail. - Next A1C anticipated 07/19/2018 or later.    #Medication access - patient with [facility] MA plan. No subsidy at present. Not in donut hole. Endorses some issues with affordability.  -Applied for LIS/Extra help today -Started patient assistance program application for [employer].

## 2018-04-22 NOTE — Patient Instructions (Addendum)
Nice to meet you! We applied for Low Income Subsidy/Extra Help today. You should get a letter in the mail from social security with the decision on this. If approved, they will help with cost of medicine and insurance.   1. Continue metformin XR 500 mg - 2 tablets twice a day and glipizide XL 5 mg daily 2. Start Jardiance 10 mg daily. We are applying for patient assistance for this as well. Do not take this medication if you are sick/dehydrated/throwing up. Side effects include UTI or yeast infection and low blood pressure (dizziness). If either of these things happen, please call us.   Come back to see me in 4-6 weeks and bring your blood sugar numbers.

## 2018-04-24 ENCOUNTER — Other Ambulatory Visit: Payer: Self-pay | Admitting: Internal Medicine

## 2018-04-24 DIAGNOSIS — Z760 Encounter for issue of repeat prescription: Secondary | ICD-10-CM

## 2018-04-24 NOTE — Telephone Encounter (Signed)
Copied from [place] [id number]. Topic: Quick Communication - Rx Refill/Question >> Apr 24, 2018  1:37 PM Mylinda Latina, NT wrote: Medication: diclofenac (VOLTAREN) 50 MG EC tablet , metFORMIN (GLUCOPHAGE-XR) 500 MG 24 hr tablet,  traZODone (DESYREL) 50 MG tablet , levocetirizine (XYZAL) 5 MG tablet and pantoprazole (PROTONIX) 40 MG tablet  Has the patient contacted their pharmacy? Yes.  States she needed to call office so the provider can send the refills  (Agent: If no, request that the patient contact the pharmacy for the refill.) (Agent: If yes, when and what did the pharmacy advise?)  Preferred Pharmacy (with phone number or street name): [place], [place] [phone] (Phone) [phone] (Fax)      Agent: Please be advised that RX refills may take up to 3 business days. We ask that you follow-up with your pharmacy.

## 2018-04-25 ENCOUNTER — Other Ambulatory Visit: Payer: Self-pay | Admitting: Internal Medicine

## 2018-04-29 ENCOUNTER — Telehealth: Payer: Self-pay | Admitting: Pharmacist

## 2018-04-29 NOTE — Telephone Encounter (Signed)
Pt calls and left message on my work cell stating she received a letter and she has some questions regarding it. Checked Medicare.gov and patient has NOT been approved for LIS/Extra help yet. Returned patient's call and left HIPAA-compliant VM requesting she return my call.  Carlean Jews, Pharm.D., BCPS PGY2 Ambulatory Care Pharmacy Resident Phone: [phone]

## 2018-05-17 ENCOUNTER — Other Ambulatory Visit (INDEPENDENT_AMBULATORY_CARE_PROVIDER_SITE_OTHER): Payer: Medicare HMO

## 2018-05-17 DIAGNOSIS — D473 Essential (hemorrhagic) thrombocythemia: Secondary | ICD-10-CM | POA: Diagnosis not present

## 2018-05-17 DIAGNOSIS — D75839 Thrombocytosis, unspecified: Secondary | ICD-10-CM

## 2018-05-17 LAB — PLATELET COUNT: Platelets: 411 10*3/uL — ABNORMAL HIGH (ref 140–400)

## 2018-05-20 ENCOUNTER — Ambulatory Visit: Payer: Medicare HMO | Admitting: Pharmacist

## 2018-05-20 DIAGNOSIS — D2261 Melanocytic nevi of right upper limb, including shoulder: Secondary | ICD-10-CM | POA: Diagnosis not present

## 2018-05-20 DIAGNOSIS — D225 Melanocytic nevi of trunk: Secondary | ICD-10-CM | POA: Diagnosis not present

## 2018-05-20 DIAGNOSIS — D2272 Melanocytic nevi of left lower limb, including hip: Secondary | ICD-10-CM | POA: Diagnosis not present

## 2018-05-20 DIAGNOSIS — D2262 Melanocytic nevi of left upper limb, including shoulder: Secondary | ICD-10-CM | POA: Diagnosis not present

## 2018-05-20 DIAGNOSIS — D2271 Melanocytic nevi of right lower limb, including hip: Secondary | ICD-10-CM | POA: Diagnosis not present

## 2018-05-20 DIAGNOSIS — X32XXXA Exposure to sunlight, initial encounter: Secondary | ICD-10-CM | POA: Diagnosis not present

## 2018-05-20 DIAGNOSIS — L570 Actinic keratosis: Secondary | ICD-10-CM | POA: Diagnosis not present

## 2018-05-20 DIAGNOSIS — L821 Other seborrheic keratosis: Secondary | ICD-10-CM | POA: Diagnosis not present

## 2018-05-28 NOTE — Telephone Encounter (Signed)
Checked [email or web address], patient has NOT been approved for LIS yet. Will follow up in 7-10 business days.   Contacted [employer], they had only received part of the application for Jardiance. Re-faxed application. Will follow up in 7-10 business days.   Catie Darnelle Maffucci, PharmD PGY2 Ambulatory Care Pharmacy Resident Phone: [phone]

## 2018-06-06 ENCOUNTER — Other Ambulatory Visit: Payer: Self-pay | Admitting: Internal Medicine

## 2018-06-07 ENCOUNTER — Telehealth: Payer: Self-pay | Admitting: Internal Medicine

## 2018-06-07 NOTE — Telephone Encounter (Signed)
Left message for pt to contact [employer] to receive refills. Pt had prescription that was sent to [facility] on 04/09/18 with 12 refills.

## 2018-06-07 NOTE — Telephone Encounter (Signed)
No TSH since 12/27/16 ok  To fill?

## 2018-06-07 NOTE — Telephone Encounter (Signed)
Copied from [place] [id number]. Topic: Quick Communication - Rx Refill/Question >> Jun 07, 2018  2:29 PM Yvette Rack wrote: Medication: glucose blood (ONETOUCH VERIO) test strip  Has the patient contacted their pharmacy? No.  Uses mail order (Agent: If no, request that the patient contact the pharmacy for the refill.) (Agent: If yes, when and what did the pharmacy advise?)  Preferred Pharmacy (with phone number or street name):     [place], [place] [phone] (Phone) [phone] (Fax)      Agent: Please be advised that RX refills may take up to 3 business days. We ask that you follow-up with your pharmacy.

## 2018-06-10 ENCOUNTER — Telehealth: Payer: Self-pay | Admitting: Pharmacist

## 2018-06-10 NOTE — Telephone Encounter (Signed)
Contacted [employer] - patient has been temporarily approved for Jardiance patient assistance. They noted that she might qualify for Medicare Low Income subsidy. I checked Medicare.gov and it noted that patient does NOT have LIS; she would have received approval at this point if she had qualified (application was submitted on 6/17 at her last appointment with Deirdre Pippins, clinical pharmacist). [employer] will need a LIS denial letter for full approval for Jardiance.   Contacted patient, left HIPAA compliant voicemail for her to return my call to discuss dropping off copy of LIS denial letter at clinic.   Catie Darnelle Maffucci, PharmD PGY2 Ambulatory Care Pharmacy Resident Phone: [phone]

## 2018-06-13 ENCOUNTER — Telehealth: Payer: Self-pay | Admitting: Internal Medicine

## 2018-06-13 NOTE — Telephone Encounter (Signed)
Copied from [place] [id number]. Topic: Quick Communication - Rx Refill/Question >> Jun 13, 2018  5:27 PM Oliver Pila B wrote: Medication: glucose blood (ONETOUCH VERIO) test strip [[id number]]  Pt called and states that she received the wrong test strips (above); pt says she needs accu check test strips; contact pt to advise

## 2018-06-14 ENCOUNTER — Other Ambulatory Visit: Payer: Self-pay

## 2018-06-14 MED ORDER — GLUCOSE BLOOD VI STRP: ORAL_STRIP | 12 refills | Status: DC

## 2018-06-14 NOTE — Telephone Encounter (Signed)
Test strips for accu check sent to pharmacy.

## 2018-06-17 ENCOUNTER — Telehealth: Payer: Self-pay | Admitting: Pharmacist

## 2018-06-17 NOTE — Telephone Encounter (Signed)
Contacted patient to f/u on status of submitting LIS denial to [facility] for Jardiance patient assistance .  Left HIPAA compliant voice mail for patient to return my call.   Catie Darnelle Maffucci, PharmD PGY2 Ambulatory Care Pharmacy Resident Phone: [phone]

## 2018-06-20 ENCOUNTER — Encounter: Payer: Self-pay | Admitting: Cardiovascular Disease

## 2018-06-20 ENCOUNTER — Ambulatory Visit (INDEPENDENT_AMBULATORY_CARE_PROVIDER_SITE_OTHER): Payer: Medicare HMO | Admitting: Cardiovascular Disease

## 2018-06-20 VITALS — BP 106/62 | HR 84 | Ht 61.0 in | Wt 148.0 lb

## 2018-06-20 DIAGNOSIS — E1159 Type 2 diabetes mellitus with other circulatory complications: Secondary | ICD-10-CM

## 2018-06-20 DIAGNOSIS — R0602 Shortness of breath: Secondary | ICD-10-CM | POA: Diagnosis not present

## 2018-06-20 DIAGNOSIS — I700 Atherosclerosis of aorta: Secondary | ICD-10-CM

## 2018-06-20 DIAGNOSIS — I739 Peripheral vascular disease, unspecified: Secondary | ICD-10-CM

## 2018-06-20 DIAGNOSIS — I25118 Atherosclerotic heart disease of native coronary artery with other forms of angina pectoris: Secondary | ICD-10-CM

## 2018-06-20 DIAGNOSIS — R079 Chest pain, unspecified: Secondary | ICD-10-CM | POA: Diagnosis not present

## 2018-06-20 DIAGNOSIS — J449 Chronic obstructive pulmonary disease, unspecified: Secondary | ICD-10-CM | POA: Diagnosis not present

## 2018-06-20 DIAGNOSIS — I6523 Occlusion and stenosis of bilateral carotid arteries: Secondary | ICD-10-CM | POA: Diagnosis not present

## 2018-06-20 NOTE — Patient Instructions (Addendum)

## 2018-06-20 NOTE — Progress Notes (Signed)
The Cardiology Office Note  Date:  06/20/2018   ID:  [name], DOB [birth year], MRN [id number]  PCP:  Einar Pheasant, MD   Chief Complaint  Patient presents with  . Other    Past due 6 month follow up. Patient denies chest pain and SOB.  Patient was last seen 05/14/2017. Meds reviewed verbally with patient.     HPI:  [name] is a pleasant 72 year old woman with history of Smoking, 23 years, quit in 1985 Diabetes Hyperlipidemia Carotid stenosis,  60-79% range on the left right carotid artery stenosis 40-59% range. Previously seen by Dr Genevive Bi for abnormal chest CT,  lung nodule Who presents for f/u of her shortness of breath and chest tightness  In follow-up today she reports that she feels very well Quit her job at [employer] several months ago Spending time with her sister Sister with pulmonary fibrosis  Reports her breathing is relatively stable Arthritis in knees No regular exercise Reports difficulty getting some of her medications secondary to cost Living on fixed income  No significant chest pain on exertion Chronic mild shortness of breath on exertion but stable activitiesLimited by chronic knee pain  Labs reviewed HBa1C 8.9 ( two years ago was 6.9) Total chol 132, LDL 63  EKG personally reviewed by myself on todays visit Shows normal sinus rhythm with rate 84 bpm no significant ST or T-wave changes  Other past medical history reviewed Aortic atherosclerosis in the aorta and coronary arteries   PMH:   has a past medical history of Depression, Diabetes mellitus without complication ([place]), GERD (gastroesophageal reflux disease), Heart murmur, Hypercholesterolemia, Hypothyroidism, Osteoarthritis of knee, and Wears dentures.  PSH:    Past Surgical History:  Procedure Laterality Date  . ABDOMINAL HYSTERECTOMY  1985   ovaries not removed  . BREAST CYST EXCISION Right   . BREAST CYST EXCISION Left   . CATARACT EXTRACTION W/PHACO Right 02/16/2016   Procedure: CATARACT EXTRACTION PHACO AND INTRAOCULAR LENS PLACEMENT ([place]) RIGHT ;  Surgeon: Leandrew Koyanagi, MD;  Location: [facility];  Service: Ophthalmology;  Laterality: Right;  DIABETIC - oral meds  . CATARACT EXTRACTION W/PHACO Left 03/15/2016   Procedure: CATARACT EXTRACTION PHACO AND INTRAOCULAR LENS PLACEMENT (I[facility])left eye;  Surgeon: Leandrew Koyanagi, MD;  Location: [facility];  Service: Ophthalmology;  Laterality: Left;  DIABETIC - oral meds  . INNER EAR SURGERY      Current Outpatient Medications  Medication Sig Dispense Refill  . cholecalciferol (VITAMIN D) 1000 units tablet Take 1,000 Units by mouth daily.    . cyanocobalamin 1000 MCG tablet Take 1,000 mcg by mouth daily.    . diclofenac (VOLTAREN) 50 MG EC tablet TAKE 1 TABLET TWICE DAILY AS NEEDED 180 tablet 0  . empagliflozin (JARDIANCE) 10 MG TABS tablet Take 10 mg by mouth daily. 30 tablet 3  . FLUoxetine (PROZAC) 20 MG capsule Take 1 capsule (20 mg total) daily by mouth. 90 capsule 1  . fluticasone (FLONASE) 50 MCG/ACT nasal spray Place 2 sprays into both nostrils daily. 48 g 3  . glipiZIDE (GLUCOTROL XL) 5 MG 24 hr tablet TAKE 1 TABLET EVERY DAY WITH BREAKFAST 90 tablet 1  . glucose blood (ONETOUCH VERIO) test strip Use as instructed to check blood sugars twice daily. Dx: E 11.9 100 each 12  . glucose blood test strip Use as instructed to check blood sugars to check blood sugars twice daily. Dx E11.9 100 each 12  . Lancets (ONETOUCH ULTRASOFT) lancets Use as instructed to check  sugars twice daily. Dx: E 11.9 100 each 12  . levocetirizine (XYZAL) 5 MG tablet TAKE 1 TABLET (5 MG TOTAL) BY MOUTH DAILY. 90 tablet 0  . levothyroxine (SYNTHROID, LEVOTHROID) 88 MCG tablet TAKE 1 TABLET (88 MCG TOTAL) BY MOUTH DAILY. 90 tablet 0  . metFORMIN (GLUCOPHAGE-XR) 500 MG 24 hr tablet TAKE 2 TABLETS TWICE DAILY 360 tablet 1  . pantoprazole (PROTONIX) 40 MG tablet TAKE 1 TABLET 2 TIMES DAILY BEFORE A MEAL. 180 tablet 0   . rosuvastatin (CRESTOR) 40 MG tablet TAKE 1 TABLET EVERY DAY 30 tablet 0  . traZODone (DESYREL) 50 MG tablet TAKE 1 TABLET EVERY DAY 90 tablet 0   No current facility-administered medications for this visit.      Allergies:   Codeine; Meperidine and related; Oxycontin [oxycodone hcl]; and Tape   Social History:  The patient  reports that she quit smoking about 34 years ago. She has a 23.00 pack-year smoking history. She has never used smokeless tobacco. She reports that she drinks alcohol. She reports that she does not use drugs.   Family History:   family history includes Breast cancer in her cousin, maternal aunt, other, and paternal aunt; Cervical cancer in her paternal aunt; Coronary artery disease in her father; Diabetes in her brother, father, and unknown relative; Glaucoma in her sister; Hypercholesterolemia in her father; Hypertension in her brother, father, and sister.    Review of Systems: Review of Systems  Constitutional: Negative.   Respiratory: Positive for shortness of breath.   Cardiovascular: Negative.   Gastrointestinal: Negative.   Musculoskeletal: Negative.   Neurological: Negative.   Psychiatric/Behavioral: Negative.   All other systems reviewed and are negative.    PHYSICAL EXAM: VS:  BP 106/62 (BP Location: Left Arm, Patient Position: Sitting, Cuff Size: Normal)   Pulse 84   Ht 5\' 1"  (1.549 m)   Wt 148 lb (67.1 kg)   BMI 27.96 kg/m  , BMI Body mass index is 27.96 kg/m. Constitutional:  oriented to person, place, and time. No distress.  HENT:  Head: Normocephalic and atraumatic.  Eyes:  no discharge. No scleral icterus.  Neck: Normal range of motion. Neck supple. No JVD present.  Cardiovascular: Normal rate, regular rhythm, normal heart sounds and intact distal pulses. Exam reveals no gallop and no friction rub. No edema No murmur heard. Pulmonary/Chest: Effort normal and breath sounds normal. No stridor. No respiratory distress.  no wheezes.  no  rales.  no tenderness.  Abdominal: Soft.  no distension.  no tenderness.  Musculoskeletal: Normal range of motion.  no  tenderness or deformity.  Neurological:  normal muscle tone. Coordination normal. No atrophy Skin: Skin is warm and dry. No rash noted. not diaphoretic.  Psychiatric:  normal mood and affect. behavior is normal. Thought content normal.    Recent Labs: 04/18/2018: ALT 15; BUN 16; Creatinine, Ser 0.84; Hemoglobin 12.2; Potassium 5.0; Sodium 137 05/17/2018: Platelets 411    Lipid Panel Lab Results  Component Value Date   CHOL 129 04/18/2018   HDL 40.20 04/18/2018   LDLCALC 63 04/18/2018   TRIG 128.0 04/18/2018      Wt Readings from Last 3 Encounters:  06/20/18 148 lb (67.1 kg)  04/22/18 154 lb (69.9 kg)  04/09/18 150 lb 3.2 oz (68.1 kg)       ASSESSMENT AND PLAN:  SOB (shortness of breath) on exertion - Plan: EKG 12-Lead  deconditioning, COPD Symptoms are stable No further testing at this time, recommended regular walking program  Type 2 diabetes mellitus with other circulatory complication, without long-term current use of insulin (HCC) - Plan: EKG 12-Lead Hemoglobin A1c Is elevated No significant weight change We have encouraged continued exercise, careful diet management in an effort to lose weight. She attributes the change in A1c to her medications Difficulty affording select diabetes medication  Bilateral carotid artery stenosis - Plan: EKG 12-Lead Previously Followed by Dr. Lucky Cowboy Consider ultrasound in one year Cholesterol at goal, nonsmoker Stressed importance of working on her diabetes  Hypercholesterolemia - Plan: EKG 12-Lead Cholesterol is at goal on the current lipid regimen. No changes to the medications were made.  Coronary artery disease of native artery of native heart with stable angina pectoris (HCC) Diffuse three-vessel coronary calcification seen on CT scan chest She denies any chest pain concerning for angina She will call for  any worsening symptoms  Aortic atherosclerosis ([place]) seen on CT scan chest April 2018 Moderate diffuse disease Cholesterol at goal   Total encounter time more than  25 minutes  Greater than 50% was spent in counseling and coordination of care with the patient  Disposition:   F/U  12 months   Orders Placed This Encounter  Procedures  . EKG 12-Lead     Signed, Esmond Plants, M.D., Ph.D. 06/20/2018  [place], [place]

## 2018-06-21 ENCOUNTER — Telehealth: Payer: Self-pay | Admitting: *Deleted

## 2018-06-21 DIAGNOSIS — I6523 Occlusion and stenosis of bilateral carotid arteries: Secondary | ICD-10-CM

## 2018-06-21 NOTE — Telephone Encounter (Signed)
-----   Message from Minna Merritts, MD sent at 06/20/2018  6:08 PM EDT ----- Can we  rearrange carotid ultrasound in one year Known stenosis Follow-up in clinic after that thx TG

## 2018-06-21 NOTE — Telephone Encounter (Signed)
Order for carotid doppler in 1 year entered. Enter recall for office visit in 1 year as well.  No answer. Left detail message with recommendations, ok per DPR, and to call back if any questions.

## 2018-06-28 ENCOUNTER — Telehealth: Payer: Self-pay | Admitting: Pharmacist

## 2018-06-28 NOTE — Telephone Encounter (Signed)
Attempted to contact patient regarding information from [facility] and their need for LIS denial paperwork (see 06/10/2018 Phone Note) before they will approve Jardiance patient assistance.   Patient's mailbox was full, and I was unable to leave a message.   Catie Darnelle Maffucci, PharmD PGY2 Ambulatory Care Pharmacy Resident Phone: [phone]

## 2018-07-04 ENCOUNTER — Other Ambulatory Visit: Payer: Self-pay | Admitting: Internal Medicine

## 2018-07-04 DIAGNOSIS — Z760 Encounter for issue of repeat prescription: Secondary | ICD-10-CM

## 2018-07-10 ENCOUNTER — Ambulatory Visit: Payer: Medicare HMO | Admitting: Family Medicine

## 2018-07-11 ENCOUNTER — Encounter: Payer: Self-pay | Admitting: Family Medicine

## 2018-07-11 ENCOUNTER — Ambulatory Visit (INDEPENDENT_AMBULATORY_CARE_PROVIDER_SITE_OTHER): Payer: Medicare HMO | Admitting: Family Medicine

## 2018-07-11 VITALS — BP 116/60 | HR 72 | Temp 98.0°F | Resp 16 | Ht 61.0 in | Wt 149.4 lb

## 2018-07-11 DIAGNOSIS — B373 Candidiasis of vulva and vagina: Secondary | ICD-10-CM | POA: Diagnosis not present

## 2018-07-11 DIAGNOSIS — E1159 Type 2 diabetes mellitus with other circulatory complications: Secondary | ICD-10-CM | POA: Diagnosis not present

## 2018-07-11 DIAGNOSIS — Z23 Encounter for immunization: Secondary | ICD-10-CM

## 2018-07-11 DIAGNOSIS — B3731 Acute candidiasis of vulva and vagina: Secondary | ICD-10-CM

## 2018-07-11 DIAGNOSIS — N898 Other specified noninflammatory disorders of vagina: Secondary | ICD-10-CM | POA: Diagnosis not present

## 2018-07-11 LAB — POCT URINALYSIS DIPSTICK
Bilirubin, UA: NEGATIVE
Glucose, UA: POSITIVE — AB
Ketones, UA: NEGATIVE
LEUKOCYTES UA: NEGATIVE
NITRITE UA: NEGATIVE
PH UA: 6 (ref 5.0–8.0)
PROTEIN UA: NEGATIVE
RBC UA: NEGATIVE
Spec Grav, UA: 1.015 (ref 1.010–1.025)
Urobilinogen, UA: 0.2 E.U./dL

## 2018-07-11 MED ORDER — BLOOD GLUCOSE MONITOR KIT: PACK | 0 refills | Status: DC

## 2018-07-11 MED ORDER — FLUCONAZOLE 150 MG PO TABS: ORAL_TABLET | ORAL | 0 refills | Status: DC

## 2018-07-11 MED ORDER — MICONAZOLE NITRATE 2 % VA CREA: 1.0000 | TOPICAL_CREAM | Freq: Every day | VAGINAL | 0 refills | Status: AC

## 2018-07-11 NOTE — Progress Notes (Signed)
Subjective:    Patient ID: Jody Wang, female    DOB: 19-Oct-1946, 72 y.o.   MRN: [id number]  HPI   Patient presents to clinic with vaginal itching/irritation.  Denies any possibility of STDs as she is not sexually active.  Patient notes some white discharge.  She thought over-the-counter hydrocortisone cream labeled good for use and vaginal itching from [facility].  States the cream does help somewhat with the itching but irritation persists.  Patient is a known diabetic, last A1c in June 2019 was 8.9%.  New medication was added at that time by PCP to help better control sugar numbers with plan to also follow up with clinical pharmacist.   Patient Active Problem List   Diagnosis Date Noted  . Thrombocytosis ([place]) 04/14/2018  . Lung nodule, multiple 03/14/2018  . AK (actinic keratosis) 03/14/2018  . Aortic atherosclerosis ([place]) 05/14/2017  . Coronary artery disease of native artery of native heart with stable angina pectoris ([place]) 05/14/2017  . Abnormal CT of the chest 02/11/2017  . SOB (shortness of breath) on exertion 02/01/2017  . Chest pain 01/28/2017  . Primary osteoarthritis of right knee 08/29/2016  . Shingles 07/02/2016  . Knee pain 07/11/2015  . Health care maintenance 02/27/2015  . Carotid stenosis 02/27/2015  . Cerumen impaction 06/21/2014  . Weight loss 04/12/2014  . Environmental allergies 04/12/2014  . Right shoulder pain 10/08/2013  . GERD (gastroesophageal reflux disease) 10/09/2012  . Depression 10/08/2012  . Hypercholesterolemia 10/08/2012  . Hypothyroidism 10/08/2012  . Diabetes mellitus ([place]) 10/08/2012   Social History   Tobacco Use  . Smoking status: Former Smoker    Packs/day: 1.00    Years: 23.00    Pack years: 23.00    Last attempt to quit: 11/20/1983    Years since quitting: 34.6  . Smokeless tobacco: Never Used  Substance Use Topics  . Alcohol use: Yes    Alcohol/week: 0.0 standard drinks    Comment: rare   Review of Systems     Constitutional: Negative for chills, fatigue and fever.  HENT: Negative for congestion, ear pain, sinus pain and sore throat.   Eyes: Negative.   Respiratory: Negative for cough, shortness of breath and wheezing.   Cardiovascular: Negative for chest pain, palpitations and leg swelling.  Gastrointestinal: Negative for abdominal pain, diarrhea, nausea and vomiting.  Genitourinary: Negative for dysuria, frequency and urgency. +vaginal irritation/itching Musculoskeletal: Negative for arthralgias and myalgias.  Skin: Negative for color change, pallor and rash.  Neurological: Negative for syncope, light-headedness and headaches.  Psychiatric/Behavioral: The patient is not nervous/anxious.       Objective:   Physical Exam  Constitutional: She is oriented to person, place, and time. She appears well-developed and well-nourished.  HENT:  Head: Normocephalic and atraumatic.  Eyes: No scleral icterus.  Neck: Neck supple. No tracheal deviation present.  Cardiovascular: Normal rate and regular rhythm.  Pulmonary/Chest: Effort normal. No respiratory distress.  Abdominal: Soft. She exhibits no distension. There is no tenderness. There is no guarding.  Genitourinary:    Pelvic exam was performed with patient supine. There is no rash on the right labia. There is no rash on the left labia. No tenderness or bleeding in the vagina. No foreign body in the vagina. Vaginal discharge found.  Genitourinary Comments: Rash area outlined in red on diagram - it is within labial folds and surrounding peri area skin.  Almost looks like diaper rash -- it is NOT vesicular in appearance.  +thin white vaginal discharge, appears consistent  with yeast.   Neurological: She is alert and oriented to person, place, and time.  Gait normal.   Skin: Skin is warm and dry. No pallor.  Psychiatric: She has a normal mood and affect. Her behavior is normal.  Nursing note and vitals reviewed.   Vitals:   07/11/18 1504  BP:  116/60  Pulse: 72  Resp: 16  Temp: 98 F (36.7 C)  SpO2: 100%      Assessment & Plan:   Vaginal irritation/discharge/suspected yeast infection --advised to stop the over-the-counter steroid cream she bought from [facility].  She will take oral Diflucan x2 doses, and also do Monistat cream vaginal treatment for 7 days.  If treatment of yeast infection with oral pills and topical cream does not improve symptoms, we will consider referral to GYN.  Irritation of skin and vaginal area/perineal area almost looks like a diaper rash, patient states she often will wear a pad for urinary leakage.  Patient advised to not wear a pad at least 4 hours per day to allow skin some time to air out.  Flu vaccine given in clinic today.   We will contact patient in 1 week to see how symptoms are doing.

## 2018-07-11 NOTE — Progress Notes (Signed)
poct

## 2018-07-11 NOTE — Patient Instructions (Signed)
Great to see you!  If these symptoms do not clear up in next week, will do GYN referral

## 2018-07-12 ENCOUNTER — Other Ambulatory Visit: Payer: Self-pay | Admitting: Family Medicine

## 2018-07-12 ENCOUNTER — Encounter: Payer: Self-pay | Admitting: Family Medicine

## 2018-07-15 ENCOUNTER — Telehealth: Payer: Self-pay | Admitting: Internal Medicine

## 2018-07-15 NOTE — Telephone Encounter (Signed)
This can be received otc

## 2018-07-15 NOTE — Telephone Encounter (Unsigned)
Copied from [place] [id number]. Topic: Quick Communication - See Telephone Encounter >> Jul 15, 2018  4:56 PM Neva Seat wrote: Pt asking for ear wax removal drops.  Asking if it can be called in to:  [facility], [address] [place] [address] Phone: [phone] Fax: [phone]

## 2018-07-16 NOTE — Telephone Encounter (Signed)
Okay to send in rx for debrox?

## 2018-07-16 NOTE — Telephone Encounter (Signed)
Let her know she can get this otc, but we can send in rx if she desires.  Also, need to know if she is having problems with her ears.  If pain, etc - may need to be evaluated before using.

## 2018-07-17 NOTE — Telephone Encounter (Signed)
Called patient, unable to leave message because the mailbox is full

## 2018-07-22 ENCOUNTER — Telehealth: Payer: Self-pay | Admitting: Family Medicine

## 2018-07-22 NOTE — Telephone Encounter (Signed)
Please call patient to follow  Up on how her yeast infection/rash is doing after treatment with cream and diflucan.  If not better, I can do GYN referral

## 2018-07-23 NOTE — Telephone Encounter (Signed)
Called patient to follow up unable to leave voicemail. OK for PEC to speak with patient to see if she is feeling better.

## 2018-07-24 NOTE — Telephone Encounter (Signed)
Called patient yesterday and was unable to leave message, can you please call to see if patient is doing better.

## 2018-07-24 NOTE — Telephone Encounter (Signed)
Called Pt did not get a answer and VM was too full.

## 2018-07-24 NOTE — Telephone Encounter (Signed)
Yes it is OK for PEC to talk to patient  LG

## 2018-07-29 NOTE — Telephone Encounter (Signed)
Called patient. Mailbox is full, unable to leave message

## 2018-07-30 NOTE — Telephone Encounter (Signed)
Called Pt to see how she was doing.Pt said she feels so much better.  Pt stated she  has an appt next month with Dr. Nicki Reaper

## 2018-07-30 NOTE — Telephone Encounter (Signed)
Great, thanks!  LG

## 2018-07-30 NOTE — Telephone Encounter (Signed)
Pt aware and says she will get some of them OTC. Denies ear pain

## 2018-07-30 NOTE — Telephone Encounter (Signed)
Pt returned call

## 2018-08-06 ENCOUNTER — Telehealth: Payer: Self-pay | Admitting: Internal Medicine

## 2018-08-06 NOTE — Telephone Encounter (Signed)
Copied from [place] [phone]. Topic: Quick Communication - Rx Refill/Question >> Aug 06, 2018 12:08 PM Margot Ables wrote: Medication: test strips and lancets - pt has one touch verio - pt has only a few days left but needs them thru [facility] - she was not able to get then at the beginning of September from [place] the patient contacted their pharmacy? Yes - [place] stated they have contacted the provider - [place] # [phone], Order # [id number] - pt states they've been contacting office since 07/31/18 Preferred Pharmacy (with phone number or street name): [place], [place] [phone] (Phone) [phone] (Fax)

## 2018-08-06 NOTE — Telephone Encounter (Signed)
Called pt.; she was advised by [facility], that they are waiting to hear from PCP office on refilling the One Touch Verio test strips.  Noted last refill was written on 04/09/18, #100, RF x 12.  Phone call to [facility].  Spoke with Marjean Donna. Technician, re: the One Touch Verio Test Strips.  Advised that refills were sent as follows: 04/11/18 # 200; 06/11/18 # 100; 06/26/18 # 200.  Was advised that, per the plan limit, the pt. has exceeded the amount she can use per month.  Reported that the pt. confirmed she is checking her blood sugars twice/day.  Per Santiago Glad, have the pt. check if she has rec'd. the previous shipments, and if she has not, she can call the Clinical Services line directly @ [phone].  Was advised that the One Touch Ultra Soft lancets are out of stock, and the pt. may need to get those filled, locally.    Returned call to the pt.  She will check to make sure the shipments were not rec'd, and will call the Clinical Services #.  Stated she actually has enough lancets, and does not need a refill at this time.  The pt. Will contact us if she needs anything further.

## 2018-08-14 ENCOUNTER — Other Ambulatory Visit: Payer: Self-pay

## 2018-08-14 MED ORDER — ROSUVASTATIN CALCIUM 40 MG PO TABS: 40.0000 mg | ORAL_TABLET | Freq: Every day | ORAL | 3 refills | Status: DC

## 2018-08-15 ENCOUNTER — Ambulatory Visit (INDEPENDENT_AMBULATORY_CARE_PROVIDER_SITE_OTHER): Payer: Medicare HMO | Admitting: Internal Medicine

## 2018-08-15 ENCOUNTER — Encounter: Payer: Self-pay | Admitting: Internal Medicine

## 2018-08-15 VITALS — BP 100/58 | HR 87 | Temp 98.3°F | Resp 16 | Ht 61.0 in | Wt 147.5 lb

## 2018-08-15 DIAGNOSIS — R9389 Abnormal findings on diagnostic imaging of other specified body structures: Secondary | ICD-10-CM | POA: Diagnosis not present

## 2018-08-15 DIAGNOSIS — E2839 Other primary ovarian failure: Secondary | ICD-10-CM

## 2018-08-15 DIAGNOSIS — B3731 Acute candidiasis of vulva and vagina: Secondary | ICD-10-CM

## 2018-08-15 DIAGNOSIS — I6523 Occlusion and stenosis of bilateral carotid arteries: Secondary | ICD-10-CM

## 2018-08-15 DIAGNOSIS — F32A Depression, unspecified: Secondary | ICD-10-CM

## 2018-08-15 DIAGNOSIS — E7800 Pure hypercholesterolemia, unspecified: Secondary | ICD-10-CM

## 2018-08-15 DIAGNOSIS — I700 Atherosclerosis of aorta: Secondary | ICD-10-CM

## 2018-08-15 DIAGNOSIS — Z1239 Encounter for other screening for malignant neoplasm of breast: Secondary | ICD-10-CM

## 2018-08-15 DIAGNOSIS — K219 Gastro-esophageal reflux disease without esophagitis: Secondary | ICD-10-CM | POA: Diagnosis not present

## 2018-08-15 DIAGNOSIS — B373 Candidiasis of vulva and vagina: Secondary | ICD-10-CM

## 2018-08-15 DIAGNOSIS — E1159 Type 2 diabetes mellitus with other circulatory complications: Secondary | ICD-10-CM

## 2018-08-15 DIAGNOSIS — E119 Type 2 diabetes mellitus without complications: Secondary | ICD-10-CM | POA: Diagnosis not present

## 2018-08-15 DIAGNOSIS — D75839 Thrombocytosis, unspecified: Secondary | ICD-10-CM

## 2018-08-15 DIAGNOSIS — E039 Hypothyroidism, unspecified: Secondary | ICD-10-CM

## 2018-08-15 DIAGNOSIS — F329 Major depressive disorder, single episode, unspecified: Secondary | ICD-10-CM | POA: Diagnosis not present

## 2018-08-15 DIAGNOSIS — D473 Essential (hemorrhagic) thrombocythemia: Secondary | ICD-10-CM

## 2018-08-15 MED ORDER — FLUCONAZOLE 150 MG PO TABS: ORAL_TABLET | ORAL | 0 refills | Status: DC

## 2018-08-15 NOTE — Progress Notes (Signed)
Patient ID: Jody Wang, female   DOB: 1945/12/31, 72 y.o.   MRN: [id number]   Subjective:    Patient ID: Jody Wang, female    DOB: 1946-08-28, 72 y.o.   MRN: [id number]  HPI  Patient here for a scheduled follow up.  She reports she has been doing relatively well.  Does report vaginal itching.  Was seen earlier and diagnosed with yeast infection.  Took diflucan.  Symptoms resolved.  Now has return of vaginal itching.  States localized to the outside of the vagina.  No vaginal discharge.  No vaginal bleeding or spotting.  Some increased urinary frequency.  Has recently started Jardiance.  Concern given recent yeast infections.     Past Medical History:  Diagnosis Date  . Depression   . Diabetes mellitus without complication ([place])   . GERD (gastroesophageal reflux disease)   . Heart murmur    slight - followed by PCP  . Hypercholesterolemia   . Hypothyroidism   . Osteoarthritis of knee   . Wears dentures    partial lower   Past Surgical History:  Procedure Laterality Date  . ABDOMINAL HYSTERECTOMY  1985   ovaries not removed  . BREAST CYST EXCISION Right   . BREAST CYST EXCISION Left   . CATARACT EXTRACTION W/PHACO Right 02/16/2016   Procedure: CATARACT EXTRACTION PHACO AND INTRAOCULAR LENS PLACEMENT ([place]) RIGHT ;  Surgeon: Leandrew Koyanagi, MD;  Location: [facility];  Service: Ophthalmology;  Laterality: Right;  DIABETIC - oral meds  . CATARACT EXTRACTION W/PHACO Left 03/15/2016   Procedure: CATARACT EXTRACTION PHACO AND INTRAOCULAR LENS PLACEMENT (IOC)left eye;  Surgeon: Leandrew Koyanagi, MD;  Location: [facility];  Service: Ophthalmology;  Laterality: Left;  DIABETIC - oral meds  . INNER EAR SURGERY     Family History  Problem Relation Age of Onset  . Diabetes Father   . Coronary artery disease Father        s/p CABG  . Hypertension Father   . Hypercholesterolemia Father   . Hypertension Brother   . Diabetes Brother   . Hypertension  Sister   . Glaucoma Sister   . Diabetes Unknown        niece  . Breast cancer Maternal Aunt   . Breast cancer Paternal Aunt   . Cervical cancer Paternal Aunt   . Breast cancer Cousin   . Breast cancer Other    Social History   Socioeconomic History  . Marital status: Widowed    Spouse name: Not on file  . Number of children: 3  . Years of education: Not on file  . Highest education level: Not on file  Occupational History  . Not on file  Social Needs  . Financial resource strain: Not hard at all  . Food insecurity:    Worry: Never true    Inability: Never true  . Transportation needs:    Medical: No    Non-medical: No  Tobacco Use  . Smoking status: Former Smoker    Packs/day: 1.00    Years: 23.00    Pack years: 23.00    Last attempt to quit: 11/20/1983    Years since quitting: 34.7  . Smokeless tobacco: Never Used  Substance and Sexual Activity  . Alcohol use: Yes    Alcohol/week: 0.0 standard drinks    Comment: rare  . Drug use: No  . Sexual activity: Never  Lifestyle  . Physical activity:    Days per week: Not on file  Minutes per session: Not on file  . Stress: Not on file  Relationships  . Social connections:    Talks on phone: Not on file    Gets together: Not on file    Attends religious service: Not on file    Active member of club or organization: Not on file    Attends meetings of clubs or organizations: Not on file    Relationship status: Not on file  Other Topics Concern  . Not on file  Social History Narrative  . Not on file    Outpatient Encounter Medications as of 08/15/2018  Medication Sig  . ACCU-CHEK SOFTCLIX LANCETS lancets TEST BLOOD SUGAR UP TO FOUR TIMES DAILY AS DIRECTED  . blood glucose meter kit and supplies KIT Dispense based on patient and insurance preference. Use up to four times daily as directed. (FOR ICD-9 250.00, 250.01).  . cholecalciferol (VITAMIN D) 1000 units tablet Take 1,000 Units by mouth daily.  .  cyanocobalamin 1000 MCG tablet Take 1,000 mcg by mouth daily.  . diclofenac (VOLTAREN) 50 MG EC tablet TAKE 1 TABLET TWICE DAILY AS NEEDED  . empagliflozin (JARDIANCE) 10 MG TABS tablet Take 10 mg by mouth daily.  Marland Kitchen FLUoxetine (PROZAC) 20 MG capsule TAKE 1 CAPSULE EVERY DAY  . fluticasone (FLONASE) 50 MCG/ACT nasal spray Place 2 sprays into both nostrils daily.  Marland Kitchen glipiZIDE (GLUCOTROL XL) 5 MG 24 hr tablet TAKE 1 TABLET EVERY DAY WITH BREAKFAST  . glucose blood (ONETOUCH VERIO) test strip Use as instructed to check blood sugars twice daily. Dx: E 11.9  . glucose blood test strip Use as instructed to check blood sugars to check blood sugars twice daily. Dx E11.9  . levocetirizine (XYZAL) 5 MG tablet TAKE 1 TABLET (5 MG TOTAL) BY MOUTH DAILY.  Marland Kitchen levothyroxine (SYNTHROID, LEVOTHROID) 88 MCG tablet TAKE 1 TABLET (88 MCG TOTAL) BY MOUTH DAILY.  . metFORMIN (GLUCOPHAGE-XR) 500 MG 24 hr tablet TAKE 2 TABLETS TWICE DAILY  . pantoprazole (PROTONIX) 40 MG tablet TAKE 1 TABLET TWICE DAILY BEFORE MEALS  . rosuvastatin (CRESTOR) 40 MG tablet Take 1 tablet (40 mg total) by mouth daily.  . traZODone (DESYREL) 50 MG tablet TAKE 1 TABLET EVERY DAY  . fluconazole (DIFLUCAN) 150 MG tablet Take 1st tablet by mouth on Day 1 and 2nd tablet by mouth on Day 4 - if persistent symptoms.  . [DISCONTINUED] fluconazole (DIFLUCAN) 150 MG tablet Take 1st tablet by mouth on Day 1 and 2nd tablet by mouth on Day 4 (Patient not taking: Reported on 08/15/2018)   No facility-administered encounter medications on file as of 08/15/2018.     Review of Systems  Constitutional: Negative for appetite change and unexpected weight change.  HENT: Negative for congestion and sinus pressure.   Respiratory: Negative for cough, chest tightness and shortness of breath.   Cardiovascular: Negative for chest pain, palpitations and leg swelling.  Gastrointestinal: Negative for abdominal pain, diarrhea, nausea and vomiting.  Genitourinary:  Positive for frequency. Negative for difficulty urinating and dysuria.       Vaginal itching as outlined.    Musculoskeletal: Negative for joint swelling and myalgias.  Skin: Negative for color change and rash.  Neurological: Negative for dizziness, light-headedness and headaches.  Psychiatric/Behavioral: Negative for agitation and dysphoric mood.       Objective:    Physical Exam  Constitutional: She appears well-developed and well-nourished. No distress.  HENT:  Nose: Nose normal.  Mouth/Throat: Oropharynx is clear and moist.  Neck: Neck supple.  No thyromegaly present.  Cardiovascular: Normal rate and regular rhythm.  Pulmonary/Chest: Breath sounds normal. No respiratory distress. She has no wheezes.  Abdominal: Soft. Bowel sounds are normal. There is no tenderness.  Genitourinary:  Genitourinary Comments: Some mild erythema - peri vaginal region.    Musculoskeletal: She exhibits no edema or tenderness.  Lymphadenopathy:    She has no cervical adenopathy.  Skin: No rash noted. No erythema.  Psychiatric: She has a normal mood and affect. Her behavior is normal.    BP (!) 100/58 (BP Location: Left Arm, Patient Position: Sitting, Cuff Size: Normal)   Pulse 87   Temp 98.3 F (36.8 C) (Oral)   Resp 16   Ht 5' 1"  (1.549 m)   Wt 147 lb 8 oz (66.9 kg)   SpO2 96%   BMI 27.87 kg/m  Wt Readings from Last 3 Encounters:  08/15/18 147 lb 8 oz (66.9 kg)  07/11/18 149 lb 6.4 oz (67.8 kg)  06/20/18 148 lb (67.1 kg)     Lab Results  Component Value Date   WBC 9.3 04/18/2018   HGB 12.2 04/18/2018   HCT 37.2 04/18/2018   PLT 411 (H) 05/17/2018   GLUCOSE 193 (H) 04/18/2018   CHOL 129 04/18/2018   TRIG 128.0 04/18/2018   HDL 40.20 04/18/2018   LDLDIRECT 188.8 06/30/2013   LDLCALC 63 04/18/2018   ALT 15 04/18/2018   AST 15 04/18/2018   NA 137 04/18/2018   K 5.0 04/18/2018   CL 102 04/18/2018   CREATININE 0.84 04/18/2018   BUN 16 04/18/2018   CO2 26 04/18/2018   TSH 1.96  12/27/2016   HGBA1C 8.9 (H) 04/18/2018   MICROALBUR 1.0 04/18/2018       Assessment & Plan:   Problem List Items Addressed This Visit    Abnormal CT of the chest    Previously saw Dr Genevive Bi.  Had planned f/u with him.  Need to confirm.        Aortic atherosclerosis ([place])    On crestor.       Carotid stenosis    Has seen Dr Lucky Cowboy.  See note.  Will need to schedule f/u with vascular surgery when able.  Cost is an issue for her.       Depression    Stable. On prozac.        Diabetes mellitus ([place])    Outside sugar readings reviewed.  Blood sugars averaging 130-160s.  On Jardiance now.  With yeast infections recently.  Discussed may need to adjust Jardiance.  States did well with Januvia and had no  Problems.  Had to change secondary to cost.  Would like to go back on Januvia.  Discussed low carb diet and exercise.  Follow met b and a1c.        Relevant Orders   Hemoglobin F2X   Basic metabolic panel   GERD (gastroesophageal reflux disease)    Controlled on current regimen.        Hypercholesterolemia    On crestor.  Low cholesterol diet and exercise.  Follow lipid panel and liver function tests.        Relevant Orders   Hepatic function panel   Lipid panel   Hypothyroidism    On thyroid replacement.  Follow tsh.        Relevant Orders   TSH   Thrombocytosis ([facility])    Platelet count slightly increased.  Recheck cbc with next labs.        Relevant Orders  CBC with Differential/Platelet    Other Visit Diagnoses    Estrogen deficiency    -  Primary   Relevant Orders   DG Bone Density   Breast cancer screening       Relevant Orders   MM 3D SCREEN BREAST BILATERAL   Vaginal candida       With yeast infection as outlined.  Diflucan.  Discussed changing Jardiance.     Relevant Medications   fluconazole (DIFLUCAN) 150 MG tablet       Einar Pheasant, MD

## 2018-08-18 ENCOUNTER — Encounter: Payer: Self-pay | Admitting: Internal Medicine

## 2018-08-18 NOTE — Assessment & Plan Note (Signed)
On thyroid replacement.  Follow tsh.  

## 2018-08-18 NOTE — Assessment & Plan Note (Signed)
Stable.  On prozac.   

## 2018-08-18 NOTE — Assessment & Plan Note (Signed)
Platelet count slightly increased.  Recheck cbc with next labs.

## 2018-08-18 NOTE — Assessment & Plan Note (Signed)
Outside sugar readings reviewed.  Blood sugars averaging 130-160s.  On Jardiance now.  With yeast infections recently.  Discussed may need to adjust Jardiance.  States did well with Januvia and had no  Problems.  Had to change secondary to cost.  Would like to go back on Januvia.  Discussed low carb diet and exercise.  Follow met b and a1c.

## 2018-08-18 NOTE — Assessment & Plan Note (Signed)
Has seen Dr Lucky Cowboy.  See note.  Will need to schedule f/u with vascular surgery when able.  Cost is an issue for her.

## 2018-08-18 NOTE — Assessment & Plan Note (Signed)
Controlled on current regimen.   

## 2018-08-18 NOTE — Assessment & Plan Note (Signed)
On crestor.  Low cholesterol diet and exercise.  Follow lipid panel and liver function tests.   

## 2018-08-18 NOTE — Assessment & Plan Note (Signed)
On [facility].   

## 2018-08-18 NOTE — Assessment & Plan Note (Signed)
Previously saw Dr Genevive Bi.  Had planned f/u with him.  Need to confirm.

## 2018-08-19 ENCOUNTER — Other Ambulatory Visit: Payer: Self-pay | Admitting: Internal Medicine

## 2018-08-19 NOTE — Telephone Encounter (Signed)
Copied from [place] [id number]. Topic: Quick Communication - Rx Refill/Question >> Aug 19, 2018 12:08 PM Carolyn Stare wrote: Medication    fluticasone (FLONASE) 50 MCG/ACT nasal spray   Has the patient contacted their pharmacy yes   ( Preferred Pharmacy  [facility]   Agent: Please be advised that RX refills may take up to 3 business days. We ask that you follow-up with your pharmacy.

## 2018-08-20 ENCOUNTER — Telehealth: Payer: Self-pay | Admitting: Internal Medicine

## 2018-08-20 MED ORDER — FLUTICASONE PROPIONATE 50 MCG/ACT NA SUSP: 2.0000 | Freq: Every day | NASAL | 3 refills | Status: DC

## 2018-08-20 NOTE — Telephone Encounter (Signed)
-----   Message from Nanci Pina, LPN sent at 41/32/4401 12:18 PM EDT ----- Regarding: RE: medication question Patient scheduled for 08/26/18 ----- Message ----- From: Einar Pheasant, MD Sent: 08/19/2018   4:03 AM EDT To: Nanci Pina, LPN Subject: FW: medication question                        Please contact pt and see if she would be agreeable to see Catie.  Let her know that we discussed her medication and that Catie may be able to help her get assistance for her medication and discuss treatment options.  If agreeable, please schedule her an appt with Catie and let me know.  Thanks.    Dr Nicki Reaper ----- Message ----- From: De Hollingshead, [facility] Sent: 08/18/2018   8:26 PM EDT To: Einar Pheasant, MD Subject: RE: medication question                        Dr. Nicki Reaper,   I see where Chrys Racer helped her apply for Medicare [place], but when I was last able to check, she had not been approved. We'll have to see if she still has the denial letter that the [employer] would have sent; most patient assistance programs would require this proof of denial to approve her for their programs.   Since has a history of ASCVD, I would recommend seeing if she would be amenable to starting a GLP1 injectable. We could absolutely pursue patient assistance for this. If she is completely against an injection, restarting Januvia could be an option.. But would only give [place] an A1c lowering of ~0.5%.  would be happy to see her in clinic to go over all these options!  Catie ----- Message ----- From: Einar Pheasant, MD Sent: 08/18/2018  11:34 AM EDT To: De Hollingshead, [facility] Subject: medication question                            Pt recently started on jardiance.  Has had a couple of yeast infections.  Discussed with her about changing medication.  She was previously on Januvia and felt this worked well for her and was well tolerated.  Cost was an issue.  Can you help her with  this or another cheaper alternative.  We can get her an appt scheduled if needed.  Thanks    [employer]

## 2018-08-26 ENCOUNTER — Encounter: Payer: Self-pay | Admitting: Pharmacist

## 2018-08-26 ENCOUNTER — Ambulatory Visit (INDEPENDENT_AMBULATORY_CARE_PROVIDER_SITE_OTHER): Payer: Medicare HMO | Admitting: Pharmacist

## 2018-08-26 VITALS — BP 117/77 | HR 87 | Wt 151.0 lb

## 2018-08-26 DIAGNOSIS — E1165 Type 2 diabetes mellitus with hyperglycemia: Secondary | ICD-10-CM | POA: Diagnosis not present

## 2018-08-26 DIAGNOSIS — E1159 Type 2 diabetes mellitus with other circulatory complications: Secondary | ICD-10-CM

## 2018-08-26 MED ORDER — DULAGLUTIDE 0.75 MG/0.5ML ~~LOC~~ SOAJ: 0.7500 mg | SUBCUTANEOUS | 0 refills | Status: DC

## 2018-08-26 NOTE — Patient Instructions (Signed)
It was GREAT to see you today!  We are going to make two changes: 1) Start Trulicity 4.10 mg ONCE WEEKLY. You may feel some stomach upset when first starting, but this typically resolves in most patients.  2) Discontinue glipizide XL 5 mg daily    Drop off these things at the clinic:  1) Copy of your proof of income: either last year's tax returns, your statement from [employer], or a print out of your bank account activity that shows how much the [employer] pays you each month. 2) Copy of your monthly report from [facility] that shows how much you have paid out of pocket on your prescriptions this year.  3) Copy of the Low Income Subsidy Denial Letter from Medicare    Please call clinic if you have any questions or concerns.    Catie Darnelle Maffucci, PharmD

## 2018-08-26 NOTE — Assessment & Plan Note (Signed)
#  Diabetes - Currently uncontrolledd, most recent A1c 7.4% today in clinic improved from 8.9% on 04/18/2018; Goal A1c <7%. Patient reports hypoglycemic events. Patient reports adherence with medication. With a fairly controlled A1c and elevated fasting BG, it is likely she is having additional episodes of hypoglycemia that are not being caught. D/t hx ASCVD, appropriate to initiate a therapy that reduces the risk of heart attack or stroke.  - Start Trulicity 8.33 mg once weekly. Provided with samples x 4 weeks. Counseled on common side effects - Continue metformin 2000 mg daily - Discontinue glipizide  - Preparing application for Trulicity patient assistance through [employer]. Patient will bring financial documentation (proof of income, out of pocket spend report, proof of denial for LIS) to clinic

## 2018-08-26 NOTE — Progress Notes (Addendum)
S:     Chief Complaint  Patient presents with  . Medication Management    Diabetes    Patient arrives in good spirits, ambulating without assistance.  Presents for diabetes evaluation, education, and management at the request of Dr. Nicki Reaper after last PCP appointment on 08/15/2018- at that time, Jody Wang was discontinued d/t an increase in genitourinary infections after initiation. She was seen in Pharmacy Clinic on 04/22/2018- at that time, Jody Wang assisted patient in applying for Medicare Extra Help.  Patient notes that she received notice of denial for LIS, but isn't sure if she still has the paperwork. She plans to look for it. She reports a few episodes of late afternoon hypoglycemia if she hadn't eaten much.   Of note, although it was not on her medication list, she notes that she IS taking a baby aspirin daily. This has been added to her medication list.   Patient reports Diabetes was diagnosed in the late [place] coverage/medication affordability: [place] been receiving Jardiance through [facility] as a temporary approval - Applied for LIS, but was denied   Patient reports adherence with medications.  Current diabetes medications include: metformin 1000 mg BID, glipizide XL 5 mg daily Current hypertension medications include: none   Patient reports some hypoglycemic s/sx including dizziness, shakiness, sweating; ~2 episodes in the past month. Patient reports hyperglycemic symptoms including polyuria, polydipsia, polyphagia, nocturia, neuropathy, blurred vision. Patient reports self foot exams.     O:  Physical Exam  Constitutional: She appears well-nourished.   Review of Systems  All other systems reviewed and are negative.   Lab Results  Component Value Date   HGBA1C 8.9 (H) 04/18/2018   Vitals:   08/26/18 1650  BP: 117/77  Pulse: 87    Basic Metabolic Panel BMP Latest Ref Rng & Units 04/18/2018 07/25/2017 12/27/2016  Glucose 70  - 99 mg/dL 193(H) 176(H) 189(H)  BUN 6 - 23 mg/dL 16 16 10   Creatinine 0.40 - 1.20 mg/dL 0.84 0.82 0.75  Sodium 135 - 145 mEq/L 137 138 138  Potassium 3.5 - 5.1 mEq/L 5.0 4.4 4.9  Chloride 96 - 112 mEq/L 102 101 103  CO2 19 - 32 mEq/L 26 27 27   Calcium 8.4 - 10.5 mg/dL 9.3 9.2 9.5  eGFR ~ 70 mL/min/1.53m; CrCl ~ 45 mL/min  Lipid Panel     Component Value Date/Time   CHOL 129 04/18/2018 1059   TRIG 128.0 04/18/2018 1059   HDL 40.20 04/18/2018 1059   CHOLHDL 3 04/18/2018 1059   VLDL 25.6 04/18/2018 1059   LDLCALC 63 04/18/2018 1059   LDLDIRECT 188.8 06/30/2013 1131    Fasting SMBG: 130-160s  Clinical ASCVD: Yes ; High-risk conditions: Age 4631or older, h/o DM, HTN, CKD  A/P: Following discussion and approval by Dr. TDerrel Nipthe following medication changes were made:   #Diabetes - Currently uncontrolledd, most recent A1c 7.4% today in clinic improved from 8.9% on 04/18/2018; Goal A1c <7%. Patient reports hypoglycemic events. Patient reports adherence with medication. With a fairly controlled A1c and elevated fasting BG, it is likely she is having additional episodes of hypoglycemia that are not being caught. D/t hx ASCVD, appropriate to initiate a therapy that reduces the risk of heart attack or stroke.  - Start Trulicity 08.18mg once weekly. Provided with samples x 4 weeks. Counseled on common side effects - Continue metformin 2000 mg daily - Discontinue glipizide  - Preparing application for Trulicity patient assistance through [employer]. Patient will bring financial documentation (proof of income, out of pocket spend report, proof of denial for LIS) to clinic  #ASCVD risk - secondary prevention in patient aged 35-75 with DM and dx ASCVD. High intensity statin indicated. Aspirin indicated - Continued Aspirin 81 mg  - Continued rosuvastatin 40 mg.    Written patient instructions provided.  Total time in face to face counseling 45 minutes.    Follow up in Pharmacist Clinic  Visit 4 weeks.   De Hollingshead, PharmD PGY2 Ambulatory Care Pharmacy Resident Phone: [phone]   I have reviewed the above information and agree with above.   Deborra Medina, MD

## 2018-08-29 ENCOUNTER — Other Ambulatory Visit: Payer: Medicare HMO

## 2018-09-09 ENCOUNTER — Other Ambulatory Visit: Payer: Self-pay | Admitting: Internal Medicine

## 2018-09-09 DIAGNOSIS — Z760 Encounter for issue of repeat prescription: Secondary | ICD-10-CM

## 2018-09-09 MED ORDER — DICLOFENAC SODIUM 50 MG PO TBEC: 50.0000 mg | DELAYED_RELEASE_TABLET | Freq: Two times a day (BID) | ORAL | 0 refills | Status: DC | PRN

## 2018-09-09 MED ORDER — METFORMIN HCL ER 500 MG PO TB24: 1000.0000 mg | ORAL_TABLET | Freq: Two times a day (BID) | ORAL | 1 refills | Status: DC

## 2018-09-09 NOTE — Telephone Encounter (Signed)
Copied from [place] [id number]. Topic: Quick Communication - Rx Refill/Question >> Sep 09, 2018  5:47 PM Mcneil, Ja-Kwan wrote: Medication: diclofenac (VOLTAREN) 50 MG EC tablet and  metFORMIN (GLUCOPHAGE-XR) 500 MG 24 hr tablet  Has the patient contacted their pharmacy? yes   Preferred Pharmacy (with phone number or street name): [place], [place] [phone] (Phone) [phone] (Fax)  Agent: Please be advised that RX refills may take up to 3 business days. We ask that you follow-up with your pharmacy.

## 2018-09-10 ENCOUNTER — Other Ambulatory Visit: Payer: Self-pay | Admitting: Pharmacy Technician

## 2018-09-10 NOTE — Patient Outreach (Signed)
[facility]) Care Management  09/10/2018  DEVANEE POMPLUN [birth year] [id number]   Successful outreach call placed to patient regarding her medication assistance application for Trulicity.  Patient states she is waiting one more piece of information from [facility] and then she will drop off all the required documents to the doctor's office, [facility]. She states [employer] says it should be to her in 7 buiness days.   She expressed concern with not having enough medication to last until the patient assistance application gets processed.  Spoke to [place] and she will reach out to [facility] and have samples for her at the office.  Will followup with patient in 5-7 business days if documents have not been returned.  Esmeralda Blanford P. [place], [place] Management [phone]

## 2018-09-10 NOTE — Patient Outreach (Signed)
[facility]) Care Management  09/10/2018  Jody Wang Apr 29, 1946 [id number]  Routed message to Dr. Bary Leriche CMA, Garnette Scheuermann, asking to set aside 2 pens of Trulicity 2.37 mg for this patient.   Catie Darnelle Maffucci, PharmD PGY2 Ambulatory Care Pharmacy Resident, [place] [facility] Phone: [phone]

## 2018-09-23 ENCOUNTER — Telehealth: Payer: Self-pay | Admitting: Pharmacist

## 2018-09-23 NOTE — Telephone Encounter (Signed)
Received financial information from patient:  - Medicare Extra Help denial letter - Social Security statement - Print out of prescription drug claims from [facility] for this calendar year.   Will pass information along to [employer], CPhT for submission and follow up.   Catie Darnelle Maffucci, PharmD PGY2 Ambulatory Care Pharmacy Resident, [place] [facility] Phone: [phone]

## 2018-09-24 ENCOUNTER — Other Ambulatory Visit: Payer: Self-pay | Admitting: Pharmacy Technician

## 2018-09-24 NOTE — Patient Outreach (Signed)
[facility]) Care Management  09/24/2018  BIRDELL FRASIER 1945-11-28 [id number]   Received all necessary documentation and signed forms needed to submit application.  Submitted application to [employer] for [employer].   Will followup in 7-10 business days to check on the status of the application.  Dasie Chancellor P. Reannon Candella, [place] Management [phone]

## 2018-09-26 ENCOUNTER — Other Ambulatory Visit: Payer: Self-pay | Admitting: Pharmacy Technician

## 2018-09-26 NOTE — Patient Outreach (Signed)
[place] [facility]) Care Management  09/26/2018  ROSALIN BUSTER 1946/03/04 [id number]  Care coordination call placed to [facility] patient assistance foundation to inquire about the status of the patient's Trulicity application.  Spoke to [place] who said the application was in process but was missing the "patient section" of the application.  Refaxed the patient section to [place] again today 09/26/18 and will followup in 3-5 business days to check on the status of the application.  Weaver Tweed P. Aldene [place], [place] Management [phone]

## 2018-09-30 ENCOUNTER — Ambulatory Visit
Admission: RE | Admit: 2018-09-30 | Discharge: 2018-09-30 | Disposition: A | Discharge status: Home / Self Care | Payer: Medicare HMO | Source: Ambulatory Visit | Attending: Internal Medicine | Admitting: Internal Medicine

## 2018-09-30 ENCOUNTER — Other Ambulatory Visit: Payer: Self-pay | Admitting: Pharmacy Technician

## 2018-09-30 DIAGNOSIS — E2839 Other primary ovarian failure: Secondary | ICD-10-CM | POA: Insufficient documentation

## 2018-09-30 DIAGNOSIS — Z1239 Encounter for other screening for malignant neoplasm of breast: Secondary | ICD-10-CM | POA: Diagnosis present

## 2018-09-30 DIAGNOSIS — M85832 Other specified disorders of bone density and structure, left forearm: Secondary | ICD-10-CM | POA: Insufficient documentation

## 2018-09-30 DIAGNOSIS — Z1231 Encounter for screening mammogram for malignant neoplasm of breast: Secondary | ICD-10-CM | POA: Insufficient documentation

## 2018-09-30 NOTE — Patient Outreach (Signed)
[facility]) Care Management  09/30/2018  KEIRA BOHLIN [birth year] [id number]    Care coordination call placed to [place] cares to check on the status of the patient's application.   Spoke to [place] who said the application was "still in exception status' but they she would "Send an email to have it reviewed & released". She advised to check back in 2-3 business days to check on the status.  Will followup in 3-5 business days with [employer] to inquire on the status of the application.  [place]vine Glorioso P. Medardo Hassing, [place] Management [phone]

## 2018-10-07 ENCOUNTER — Encounter: Payer: Self-pay | Admitting: Pharmacist

## 2018-10-07 ENCOUNTER — Ambulatory Visit (INDEPENDENT_AMBULATORY_CARE_PROVIDER_SITE_OTHER): Payer: Medicare HMO | Admitting: Pharmacist

## 2018-10-07 ENCOUNTER — Other Ambulatory Visit: Payer: Self-pay | Admitting: Pharmacy Technician

## 2018-10-07 VITALS — BP 139/71 | HR 86 | Wt 143.0 lb

## 2018-10-07 DIAGNOSIS — E1165 Type 2 diabetes mellitus with hyperglycemia: Secondary | ICD-10-CM | POA: Diagnosis not present

## 2018-10-07 DIAGNOSIS — E1159 Type 2 diabetes mellitus with other circulatory complications: Secondary | ICD-10-CM

## 2018-10-07 LAB — POCT GLYCOSYLATED HEMOGLOBIN (HGB A1C): HEMOGLOBIN A1C: 6.9 % — AB (ref 4.0–5.6)

## 2018-10-07 MED ORDER — DULAGLUTIDE 0.75 MG/0.5ML ~~LOC~~ SOAJ: 0.7500 mg | SUBCUTANEOUS | 0 refills | Status: DC

## 2018-10-07 MED ORDER — METFORMIN HCL ER 500 MG PO TB24: 500.0000 mg | ORAL_TABLET | Freq: Two times a day (BID) | ORAL | 3 refills | Status: DC

## 2018-10-07 NOTE — Patient Outreach (Signed)
[place] [facility]) Care Management  10/07/2018  MAIYA KATES 24-Dec-1945 [id number]  Outgoing care coordination call placed to [facility] to check on the status of the patient's medication assistance application for Trulicity.  Per Andee Poles the application is still in the "exception Phase". She suggested calling back at the end of the week to see if the application has been processed. She said applications were not processed on 11/2/-11/29 due to the Thanksgiving holiday.  Will followup with company in 3-5 business days to inquire about a determination.  Ephriam Turman P. [place], [place] Management [phone]

## 2018-10-07 NOTE — Patient Instructions (Addendum)
It was GREAT to see you today! GREAT WORK!   We are going to continue your current regimen: metformin XR 500 mg twice daily and Trulicity 3.53 mg once weekly.   Keep checking your blood sugars first thing in the morning to keep an eye on things.   Keep your appointment with Dr. Nicki Reaper in February. At that point, y'all can decide if you want to come back and see me again.    Catie Darnelle Maffucci, PharmD

## 2018-10-07 NOTE — Assessment & Plan Note (Signed)
#  Diabetes - Currently controlled, most recent A1c 6.9% today in clinic improved from 8.9% on 04/18/2018; Goal A1c <7%. Patient is tolerating Trulicity well, appropriate to continue - Continue Trulicity 3.73 mg once weekly.  - Updated prescription to reflect metformin 500 mg BID. - See note from [facility], CPhT dated 10/07/2018 regarding status of Trulicity shipment. - Gave sample Trulicity 4.28 mg x 4 pens.

## 2018-10-07 NOTE — Progress Notes (Addendum)
    S:     Chief Complaint  Patient presents with  . Medication Management    Diabetes    Patient arrives in good spirits, ambulating without assistance.  Presents for diabetes evaluation, education, and management at the request of Dr. Nicki Reaper after last PCP appointment on 08/15/2018- at that time, Vania Rea was discontinued d/t an increase in genitourinary infections after initiation. Last seen in pharmacy clinic on 08/26/2018 - at that time, Trulicity was initiated.   Today, she notes that she is pleased with Trulicity and the effect it has had on her blood sugars and weight. She noticed that she has less nausea if she takes metformin 500 mg BID instead of 1000 mg BID, so she self-decreased her dose.   Insurance coverage/medication affordability: [employer] [place] for LIS - Receives Trulicity through patient assistance  Patient reports adherence with medications.  Current diabetes medications include: metformin XR 527 mg BID, Trulicity 7.82 mg once weekly Current hypertension medications include: none   Breakfast: Typically skips Lunch: Biggest meal of the day; sandwich (only eats w/1 piece of bread); leftovers Supper: Bowl of soup; occasionally oyster crackers Snacks: Grapes, salsa + chips or guac + chip; chips + cheese  O:  Physical Exam  Constitutional: She appears well-nourished.   Review of Systems  All other systems reviewed and are negative.   Lab Results  Component Value Date   HGBA1C 8.9 (H) 04/18/2018   Vitals:   10/07/18 1532  BP: 139/71  Pulse: 86    Basic Metabolic Panel BMP Latest Ref Rng & Units 04/18/2018 07/25/2017 12/27/2016  Glucose 70 - 99 mg/dL 193(H) 176(H) 189(H)  BUN 6 - 23 mg/dL _0 Creatinine 0.40 - 1.20 mg/dL 0.84 0.82 0.75  Sodium 135 - 145 mEq/L 137 138 138  Potassium 3.5 - 5.1 mEq/L 5.0 4.4 4.9  Chloride 96 - 112 mEq/L 102 101 103  CO2 19 - 32 mEq/L _1 Calcium 8.4 - 10.5 mg/dL 9.3 9.2 9.5  eGFR ~ 70  mL/min/1.39m; CrCl ~ 45 mL/min  Lipid Panel     Component Value Date/Time   CHOL 129 04/18/2018 1059   TRIG 128.0 04/18/2018 1059   HDL 40.20 04/18/2018 1059   CHOLHDL 3 04/18/2018 1059   VLDL 25.6 04/18/2018 1059   LDLCALC 63 04/18/2018 1059   LDLDIRECT 188.8 06/30/2013 1131    Fasting SMBG: 130-150s, one reading of 114.   Clinical ASCVD: Yes ; High-risk conditions: Age 273or older, h/o DM, HTN, CKD  A/P: Following discussion and approval by Dr. TDerrel Nipthe following medication changes were made:   #Diabetes - Currently controlled, most recent A1c 6.9% today in clinic improved from 8.9% on 04/18/2018; Goal A1c <7%. Patient is tolerating Trulicity well, appropriate to continue - Continue Trulicity 04.23mg once weekly.  - Updated prescription to reflect metformin 500 mg BID. - [facility]2018 regarding status of Trulicity shipment. - Gave sample Trulicity 05.36mg x 4 pens.    Written patient instructions provided.  Total time in face to face counseling 45 minutes.    Follow up in Pharmacist Clinic Visit PRN after PCP appointment 12/2018.  CDe Hollingshead PharmD PGY2 Ambulatory Care Pharmacy Resident Phone: [phone] Reviewed above information.  Agree with assessment and plan.    Dr SNicki Reaper

## 2018-10-08 ENCOUNTER — Telehealth: Payer: Self-pay | Admitting: *Deleted

## 2018-10-08 NOTE — Telephone Encounter (Signed)
Copied from [place] [id number]. Topic: General - Call Back - No Documentation >> Oct 08, 2018  4:52 PM Jody Wang wrote: Reason for CRM:   Pt states she received a call that only said "This is Dr. Bary Leriche office please return my call" Pt can be reached at [phone]

## 2018-10-10 NOTE — Telephone Encounter (Signed)
LMTCB

## 2018-10-11 ENCOUNTER — Other Ambulatory Visit: Payer: Self-pay | Admitting: Pharmacy Technician

## 2018-10-11 NOTE — Patient Outreach (Signed)
[place] [facility]) Care Management  10/11/2018  Jody Wang 1946-04-14 [id number]    Care coordination call placed to [place] cares patient assistance program to inquire about the status of Trulicity application.  Spoke to [place] who said the application was "still in exception review pending" She said she would "send an urgent expedited request" She suggested calling back in 2-3 business days to see if a determination has been made.  Will followup in 3-5 business days with [employer] to see if application has been approved.  Marcy Bogosian P. Kristine Chahal, [place] Management [phone]

## 2018-10-14 ENCOUNTER — Other Ambulatory Visit: Payer: Self-pay | Admitting: Pharmacy Technician

## 2018-10-14 NOTE — Patient Outreach (Signed)
[facility]) Care Management  10/14/2018  Jody Wang [birth year] [id number]   Successful care coordination call placed to [place] in regards to patient's medication assistance application for Trulicity.  Alinda Sierras said patient was APPROVED from 10/11/18-11/05/18. She said request was received in pharmacy on 12/9. No shipping information available yet but medication should arrive in 7-14 business days.  Will followup with patient in 14-21 business days to confirm medication was received.  Gricel Copen P. Corin Tilly, [place] Management [phone]

## 2018-10-16 ENCOUNTER — Other Ambulatory Visit: Payer: Self-pay | Admitting: Pharmacy Technician

## 2018-10-16 NOTE — Patient Outreach (Signed)
[place] [facility]) Care Management  10/16/2018  Jody Wang 1946-07-26 [id number]   Successful outreach call placed to patient in regards to her medication assistance application for 5916 with [employer] for Trulicity.  [name] answered the phone, HIPAA identifiers verfied. Informed patient that she had been approved for Trulicity from 38/4/66-59/93/57. She would be receiving 5 boxes of 4 pens per Anderson Malta the representative at [employer]. Informed [name] to be expecting a call from Dr. Bary Leriche office either late this week or next week saying it had arrived.  Inquired if patient wanted to reapply for 2020 and patient was agreeable. Informed patient that an application would be waiting for her to sign at the dr office when she went to pick up the medication. Reviewed what needed to be signed and what documents were needed. Patient verbalized understanding.   Will followup with patient in 7-14 business days to confirm she had received the medication.  Darlis Wragg P. Geovanna Simko, [place] Management [phone]

## 2018-10-17 ENCOUNTER — Other Ambulatory Visit: Payer: Self-pay | Admitting: Pharmacy Technician

## 2018-10-17 NOTE — Patient Outreach (Signed)
[place] [facility]) Care Management  10/17/2018  KERSTIN CRUSOE 1945/11/13 [id number]    Received 2020 patient assistance referral from [place] for Trulicity through [employer].  Faxed both patient and provider portions to the provider's office per [place].  Will followup in 7-14 business days to confirm receipt of applications if applications have not been sent back.  Zia Najera P. Jesua Tamblyn, [place] Management [phone]

## 2018-10-18 ENCOUNTER — Other Ambulatory Visit: Payer: Self-pay | Admitting: Internal Medicine

## 2018-10-18 DIAGNOSIS — Z760 Encounter for issue of repeat prescription: Secondary | ICD-10-CM

## 2018-10-21 ENCOUNTER — Other Ambulatory Visit: Payer: Self-pay | Admitting: Pharmacy Technician

## 2018-10-21 NOTE — Patient Outreach (Signed)
[place] [facility]) Care Management  10/21/2018  Jody Wang August 21, 1946 [id number]   ADDENDUM  Care coordination call placed to [facility] in regards to the delivery shipment of her Trulicity.  Spoke to [name] who said the shipment was returned to sender for invalid city/zip code combination. Informed [name] that the zip is [phone] and the [place] is [place]. She verified that the application stated that and said that someone had mis typed the zip code as [phone] and that is why the medication was returned.  [name] put in an expidited request to the pharmacy for a 2nd delivery. She said it may arrived sooner but typically it will be 7-10 business days.  Will followup with patient 7-10 business days to confirm receipt of medication.  Jody Wang, [place] Management [phone]

## 2018-10-21 NOTE — Patient Outreach (Signed)
[place] [facility]) Care Management  10/21/2018  Jody Wang [birth year] [id number]   Unsuccessful outgoing call placed to patient in regards to her patient assistance application for Trulicity.  Unforutnately patient was not home. HIPAA compliant voicemail left.  Will followup in 2-3 business days if call is not returned.  Emarion Toral P. Tarez Bowns, [place] Management [phone]

## 2018-10-22 ENCOUNTER — Other Ambulatory Visit: Payer: Self-pay | Admitting: Pharmacy Technician

## 2018-10-22 NOTE — Patient Outreach (Signed)
[facility]) Care Management  10/22/2018  Jody Wang Jan 05, 1946 [id number]   ADDENDUM  Received inbox message back from nurse Kerin Salen at provider's office at [place] at [facility] who stated that they do have samples of Trulicity. Requested that they put 1 box aside for patient in case her medication from [facility] does not arrive by the 30th.  Called Jody Wang, HIPPA identifiers verified. Informed patient that her provider's office did have samples and that they would put a supply aside for her. Patient verbalized understanding.   Will followup with patient in 7-10 business days to see if medication arrived from [place].  [name] P. Swayze Kozuch, [place] Management [phone]

## 2018-10-22 NOTE — Patient Outreach (Signed)
[place] [facility]) Care Management  10/22/2018  ILAMAE GENG [birth year] [id number]   Incoming call received from patient in regards to her Trulicity through [employer], [place] identifiers verified.  Patient called to inquire about the shipment status of Trulicity. Informed patient that an error was made at [place] where they inputted the wrong zip code. Informed patient that they were resending the medication out per phone call with [employer] representative on 10/21/18. Patient verbalized understanding. Patient stated that she had 1 injection left and that was to be used on the 23rd. Her next injection would be the 30th. She inquired if the provider's office had samples. Reached out through an inbox to [employer] at the provider's office to inquire on samples. Once she returns my inquiry then I will followup with the patient.  Will followup with provider's office in 3-5 business days if no response.  Weslie Pretlow P. Kimbely Whiteaker, [place] Management [phone]

## 2018-11-01 ENCOUNTER — Other Ambulatory Visit: Payer: Self-pay | Admitting: Pharmacy Technician

## 2018-11-01 NOTE — Patient Outreach (Signed)
[place] [facility]) Care Management  11/01/2018  Jody Wang 18-Aug-1946 [id number]  Successful outgoing call placed to patient in regards to [place] application for Trulicity. HIPPA identifiers verified.  Patient confirmed she picked up a 5 months supply of Trulicity from her provider's office today. She also informed me that she left proof of income with Caryl Pina at the clinic.  Will followup with patient in 10-14 business days if application/proof of income has not been received.  Rollin Kotowski P. Michial Disney, [place] Management [phone]

## 2018-11-05 ENCOUNTER — Encounter: Payer: Self-pay | Admitting: Pharmacy Technician

## 2018-11-06 ENCOUNTER — Other Ambulatory Visit: Payer: Self-pay | Admitting: Internal Medicine

## 2018-11-07 ENCOUNTER — Other Ambulatory Visit: Payer: Self-pay | Admitting: Internal Medicine

## 2018-11-07 MED ORDER — FLUTICASONE PROPIONATE 50 MCG/ACT NA SUSP: 2.0000 | Freq: Every day | NASAL | 3 refills | Status: DC

## 2018-11-07 NOTE — Telephone Encounter (Signed)
Copied from [place] [id number]. Topic: Quick Communication - Rx Refill/Question >> Nov 07, 2018  1:26 PM [name] wrote: Medication: fluticasone (FLONASE) 50 MCG/ACT nasal spray  Has the patient contacted their pharmacy? Yes - refill has expired (Agent: If no, request that the patient contact the pharmacy for the refill.) (Agent: If yes, when and what did the pharmacy advise?)  Preferred Pharmacy (with phone number or street name): [place], [place] [phone] (Phone) [phone] (Fax)  Agent: Please be advised that RX refills may take up to 3 business days. We ask that you follow-up with your pharmacy.

## 2018-11-11 DIAGNOSIS — H90A32 Mixed conductive and sensorineural hearing loss, unilateral, left ear with restricted hearing on the contralateral side: Secondary | ICD-10-CM | POA: Diagnosis not present

## 2018-11-12 ENCOUNTER — Other Ambulatory Visit: Payer: Self-pay | Admitting: Pharmacy Technician

## 2018-11-12 NOTE — Patient Outreach (Signed)
[place] [facility]) Care Management  11/12/2018  STPEHANIE MONTROY [birth year] [id number]   Received all necessary paperwork and signatures from both patient and provider for [id number] [employer] Cares application for Trulicity.  Submitted completed application to [employer] via fax.  Will followup in 10-14 business days with Lilly to inquire on the status of the application.  Vernell Back P. Ivianna Notch, [place] Management [phone]

## 2018-11-21 ENCOUNTER — Telehealth: Payer: Self-pay

## 2018-11-21 ENCOUNTER — Other Ambulatory Visit: Payer: Self-pay | Admitting: Pharmacy Technician

## 2018-11-21 NOTE — Telephone Encounter (Signed)
Received Trulicity. Labeled and placed in refrigerator for pick up. Patient is aware.

## 2018-11-21 NOTE — Patient Outreach (Signed)
[place] [facility]) Care Management  11/21/2018  DASHEA MCMULLAN [birth year] [id number]   Care coordination call placed to [facility] in regards to patient's Trulicity application.  Spoke to [place] who aid patient had been APPROVED from 11/06/2018-11/06/2019. Patient to be receiving 5 boxes of Trulicity and it is due to be delivered to the provider's office today.  Will followup with patient in 2-3 business days to confirm receipt of medication.  Kadisha Goodine P. Howell Groesbeck, [place] Management [phone]

## 2018-11-26 ENCOUNTER — Other Ambulatory Visit: Payer: Self-pay | Admitting: Pharmacy Technician

## 2018-11-26 NOTE — Patient Outreach (Signed)
[facility] [facility]) Care Management  11/26/2018  Jody Wang 1945/11/29 [id number]   Unsuccessful outgoing call placed to patient in regards to her [employer] application for Trulicity.  Patient's son answered the phone and said she was not available to take the call. HIPAA compliant voicemail left with the son. Called to inquire if patient had picked up her insulin from the provider and to see if she had any additional questions.  Will return call to patient in 3-5 business days if call is not returned.  Izayiah Tibbitts P. Xavior Niazi, [place] Management [phone]

## 2018-11-29 ENCOUNTER — Other Ambulatory Visit: Payer: Self-pay | Admitting: Pharmacy Technician

## 2018-11-29 NOTE — Patient Outreach (Signed)
[name]  11/29/2018  Jody Wang 07-19-1946 [id number]   Successful outreach call placed to patient in regards to patient assistance application with [employer] for Trulicity.  [name] answered the phone, HIPAA identifiers verified. Inquired if patient had picked up her medication from the provider's office and she stated she had. Informed patient how to obtain her refills. Patient verbalized understanding. Patient stated she had no other questions or concerns at this time. Confirmed patient had our name and number if we can be of service to her in the future.  Will remove myself from care team as patient assistance has been completed.  [name], [place] Management [phone]

## 2018-12-10 ENCOUNTER — Ambulatory Visit (INDEPENDENT_AMBULATORY_CARE_PROVIDER_SITE_OTHER): Payer: Medicare HMO | Admitting: Internal Medicine

## 2018-12-10 ENCOUNTER — Encounter: Payer: Self-pay | Admitting: Internal Medicine

## 2018-12-10 DIAGNOSIS — D473 Essential (hemorrhagic) thrombocythemia: Secondary | ICD-10-CM

## 2018-12-10 DIAGNOSIS — R9389 Abnormal findings on diagnostic imaging of other specified body structures: Secondary | ICD-10-CM

## 2018-12-10 DIAGNOSIS — F329 Major depressive disorder, single episode, unspecified: Secondary | ICD-10-CM | POA: Diagnosis not present

## 2018-12-10 DIAGNOSIS — I6523 Occlusion and stenosis of bilateral carotid arteries: Secondary | ICD-10-CM

## 2018-12-10 DIAGNOSIS — F32A Depression, unspecified: Secondary | ICD-10-CM

## 2018-12-10 DIAGNOSIS — E7800 Pure hypercholesterolemia, unspecified: Secondary | ICD-10-CM

## 2018-12-10 DIAGNOSIS — E1165 Type 2 diabetes mellitus with hyperglycemia: Secondary | ICD-10-CM | POA: Diagnosis not present

## 2018-12-10 DIAGNOSIS — R918 Other nonspecific abnormal finding of lung field: Secondary | ICD-10-CM

## 2018-12-10 DIAGNOSIS — K219 Gastro-esophageal reflux disease without esophagitis: Secondary | ICD-10-CM

## 2018-12-10 DIAGNOSIS — E039 Hypothyroidism, unspecified: Secondary | ICD-10-CM

## 2018-12-10 DIAGNOSIS — I700 Atherosclerosis of aorta: Secondary | ICD-10-CM | POA: Diagnosis not present

## 2018-12-10 DIAGNOSIS — D75839 Thrombocytosis, unspecified: Secondary | ICD-10-CM

## 2018-12-10 NOTE — Progress Notes (Signed)
Patient ID: Jody Wang, female   DOB: [birth year], 73 y.o.   MRN: [id number]   Subjective:    Patient ID: Jody Wang, female    DOB: 06-13-1946, 73 y.o.   MRN: [id number]  HPI  Patient here for a scheduled follow up.  Recently started on trulicity.  Sugars improved.  Feels good.  No chest pain.  Has lost weight.  Trying to watch her diet.  Trying to stay active.  No sob. No acid reflux.  No abdominal pain.  Bowels moving.  No urine change.  Discussed overdue f/u chest CT.  She was in agreement to schedule.  Wants to hold on f/u carotid ultrasound.  Has f/u with Dr Pryor Ochoa regarding her hearing.     Past Medical History:  Diagnosis Date  . Depression   . Diabetes mellitus without complication ([place])   . GERD (gastroesophageal reflux disease)   . Heart murmur    slight - followed by PCP  . Hypercholesterolemia   . Hypothyroidism   . Osteoarthritis of knee   . Wears dentures    partial lower   Past Surgical History:  Procedure Laterality Date  . ABDOMINAL HYSTERECTOMY  1985   ovaries not removed  . BREAST CYST EXCISION Right   . BREAST CYST EXCISION Left   . CATARACT EXTRACTION W/PHACO Right 02/16/2016   Procedure: CATARACT EXTRACTION PHACO AND INTRAOCULAR LENS PLACEMENT ([place]) RIGHT ;  Surgeon: Leandrew Koyanagi, MD;  Location: [facility];  Service: Ophthalmology;  Laterality: Right;  DIABETIC - oral meds  . CATARACT EXTRACTION W/PHACO Left 03/15/2016   Procedure: CATARACT EXTRACTION PHACO AND INTRAOCULAR LENS PLACEMENT ([facility])left eye;  Surgeon: Leandrew Koyanagi, MD;  Location: [facility];  Service: Ophthalmology;  Laterality: Left;  DIABETIC - oral meds  . INNER EAR SURGERY     Family History  Problem Relation Age of Onset  . Diabetes Father   . Coronary artery disease Father        s/p CABG  . Hypertension Father   . Hypercholesterolemia Father   . Hypertension Brother   . Diabetes Brother   . Hypertension Sister   . Glaucoma Sister   .  Diabetes Other        niece  . Breast cancer Maternal Aunt   . Breast cancer Paternal Aunt   . Cervical cancer Paternal Aunt   . Breast cancer Cousin   . Breast cancer Other    Social History   Socioeconomic History  . Marital status: Widowed    Spouse name: Not on file  . Number of children: 3  . Years of education: Not on file  . Highest education level: Not on file  Occupational History  . Not on file  Social Needs  . Financial resource strain: Not hard at all  . Food insecurity:    Worry: Never true    Inability: Never true  . Transportation needs:    Medical: No    Non-medical: No  Tobacco Use  . Smoking status: Former Smoker    Packs/day: 1.00    Years: 23.00    Pack years: 23.00    Last attempt to quit: 11/20/1983    Years since quitting: 35.0  . Smokeless tobacco: Never Used  Substance and Sexual Activity  . Alcohol use: Yes    Alcohol/week: 0.0 standard drinks    Comment: rare  . Drug use: No  . Sexual activity: Never  Lifestyle  . Physical activity:    Days per  week: Not on file    Minutes per session: Not on file  . Stress: Not on file  Relationships  . Social connections:    Talks on phone: Not on file    Gets together: Not on file    Attends religious service: Not on file    Active member of club or organization: Not on file    Attends meetings of clubs or organizations: Not on file    Relationship status: Not on file  Other Topics Concern  . Not on file  Social History Narrative  . Not on file    Outpatient Encounter Medications as of 12/10/2018  Medication Sig  . ACCU-CHEK SOFTCLIX LANCETS lancets TEST BLOOD SUGAR UP TO FOUR TIMES DAILY AS DIRECTED  . aspirin 81 MG chewable tablet Chew 81 mg by mouth daily.  . blood glucose meter kit and supplies KIT Dispense based on patient and insurance preference. Use up to four times daily as directed. (FOR ICD-9 250.00, 250.01).  . cholecalciferol (VITAMIN D) 1000 units tablet Take 1,000 Units by mouth  daily.  . cyanocobalamin 1000 MCG tablet Take 1,000 mcg by mouth daily.  . diclofenac (VOLTAREN) 50 MG EC tablet TAKE 1 TABLET TWICE DAILY AS NEEDED  . Dulaglutide (TRULICITY) 7.34 KA/7.6OT SOPN Inject 0.75 mg into the skin once a week.  Marland Kitchen FLUoxetine (PROZAC) 20 MG capsule TAKE 1 CAPSULE EVERY DAY  . fluticasone (FLONASE) 50 MCG/ACT nasal spray Place 2 sprays into both nostrils daily.  Marland Kitchen glucose blood (ONETOUCH VERIO) test strip Use as instructed to check blood sugars twice daily. Dx: E 11.9  . glucose blood test strip Use as instructed to check blood sugars to check blood sugars twice daily. Dx E11.9  . levocetirizine (XYZAL) 5 MG tablet TAKE 1 TABLET (5 MG TOTAL) BY MOUTH DAILY.  Marland Kitchen levothyroxine (SYNTHROID, LEVOTHROID) 88 MCG tablet TAKE 1 TABLET (88 MCG TOTAL) BY MOUTH DAILY.  . metFORMIN (GLUCOPHAGE-XR) 500 MG 24 hr tablet Take 1 tablet (500 mg total) by mouth 2 (two) times daily.  . pantoprazole (PROTONIX) 40 MG tablet TAKE 1 TABLET TWICE DAILY BEFORE MEALS  . rosuvastatin (CRESTOR) 40 MG tablet Take 1 tablet (40 mg total) by mouth daily.  . traZODone (DESYREL) 50 MG tablet TAKE 1 TABLET EVERY DAY   No facility-administered encounter medications on file as of 12/10/2018.     Review of Systems  Constitutional: Negative for appetite change.       Has lost weight.  Watching her diet.    HENT: Negative for congestion and sinus pressure.   Respiratory: Negative for cough, chest tightness and shortness of breath.   Cardiovascular: Negative for chest pain, palpitations and leg swelling.  Gastrointestinal: Negative for abdominal pain, diarrhea, nausea and vomiting.  Genitourinary: Negative for difficulty urinating and dysuria.  Musculoskeletal: Negative for joint swelling and myalgias.  Skin: Negative for color change and rash.  Neurological: Negative for dizziness, light-headedness and headaches.  Psychiatric/Behavioral: Negative for agitation and dysphoric mood.       Objective:       Blood pressure rechecked by me:  118/68  Physical Exam Constitutional:      General: She is not in acute distress.    Appearance: Normal appearance.  HENT:     Nose: Nose normal. No congestion.     Mouth/Throat:     Pharynx: No oropharyngeal exudate or posterior oropharyngeal erythema.  Neck:     Musculoskeletal: Neck supple. No muscular tenderness.     Thyroid: No  thyromegaly.  Cardiovascular:     Rate and Rhythm: Normal rate and regular rhythm.  Pulmonary:     Effort: No respiratory distress.     Breath sounds: Normal breath sounds. No wheezing.  Abdominal:     General: Bowel sounds are normal.     Palpations: Abdomen is soft.     Tenderness: There is no abdominal tenderness.  Musculoskeletal:        General: No swelling or tenderness.  Lymphadenopathy:     Cervical: No cervical adenopathy.  Skin:    Findings: No erythema or rash.  Neurological:     Mental Status: She is alert.  Psychiatric:        Mood and Affect: Mood normal.        Behavior: Behavior normal.     BP 124/74 (BP Location: Left Arm, Patient Position: Sitting, Cuff Size: Normal)   Pulse 89   Temp 98 F (36.7 C) (Oral)   Resp 16   Wt 141 lb (64 kg)   SpO2 96%   BMI 26.64 kg/m  Wt Readings from Last 3 Encounters:  12/10/18 141 lb (64 kg)  10/07/18 143 lb (64.9 kg)  08/26/18 151 lb (68.5 kg)     Lab Results  Component Value Date   WBC 9.3 04/18/2018   HGB 12.2 04/18/2018   HCT 37.2 04/18/2018   PLT 411 (H) 05/17/2018   GLUCOSE 193 (H) 04/18/2018   CHOL 129 04/18/2018   TRIG 128.0 04/18/2018   HDL 40.20 04/18/2018   LDLDIRECT 188.8 06/30/2013   LDLCALC 63 04/18/2018   ALT 15 04/18/2018   AST 15 04/18/2018   NA 137 04/18/2018   K 5.0 04/18/2018   CL 102 04/18/2018   CREATININE 0.84 04/18/2018   BUN 16 04/18/2018   CO2 26 04/18/2018   TSH 1.96 12/27/2016   HGBA1C 6.9 (A) 10/07/2018   MICROALBUR 1.0 04/18/2018    Dg Bone Density  Result Date: 09/30/2018 EXAM: DUAL X-RAY  ABSORPTIOMETRY (DXA) FOR BONE MINERAL DENSITY IMPRESSION: Dear Dr. Nicki Reaper, Your patient Sharmayne Jablon completed a BMD test on 09/30/2018 using the [place] (analysis version: 14.10) manufactured by [employer]. The following summarizes the results of our evaluation. PATIENT BIOGRAPHICAL: Name: Areli, Frary Patient ID: [id number] Birth Date: July 06, 1946 Height: 61.0 in. Gender: Female Exam Date: 09/30/2018 Weight: 143.9 lbs. Indications: Advanced Age, Caucasian, Diabetic, Hypothyroid, Hysterectomy, Postmenopausal Fractures: Treatments: Levothyroxine, Metformin, Vitamin D ASSESSMENT: The BMD measured at Forearm Radius 33% is 0.734 g/cm2 with a T-score of -1.6. This patient is considered osteopenic according to [facility]) criteria. Lumbar spine was not utilized due to advanced degenerative changes. The quality of the scan is good. Site Region Measured Measured WHO Young Adult BMD Date       Age      Classification T-score DualFemur Neck Left 09/30/2018 72.9 Normal -0.4 0.985 g/cm2 Left Forearm Radius 33% 09/30/2018 72.9 Osteopenia -1.6 0.734 g/cm2 World Health Organization [facility]) criteria for post-menopausal, Caucasian Women: Normal:       T-score at or above -1 SD Osteopenia:   T-score between -1 and -2.5 SD Osteoporosis: T-score at or below -2.5 SD RECOMMENDATIONS: 1. All patients should optimize calcium and vitamin D intake. 2. Consider FDA-approved medical therapies in postmenopausal women and men aged 22 years and older, based on the following: a. A hip or vertebral(clinical or morphometric) fracture b. T-score < -2.5 at the femoral neck or spine after appropriate evaluation to exclude secondary causes c. Low bone  mass (T-score between -1.0 and -2.5 at the femoral neck or spine) and a 10-year probability of a hip fracture > 3% or a 10-year probability of a major osteoporosis-related fracture > 20% based on the [place]-adapted WHO algorithm d. Clinician judgment and/or patient  preferences may indicate treatment for people with 10-year fracture probabilities above or below these levels FOLLOW-UP: People with diagnosed cases of osteoporosis or at high risk for fracture should have regular bone mineral density tests. For patients eligible for Medicare, routine testing is allowed once every 2 years. The testing frequency can be increased to one year for patients who have rapidly progressing disease, those who are receiving or discontinuing medical therapy to restore bone mass, or have additional risk factors. I have reviewed this report, and agree with the above findings. [facility] Radiology Dear Dr. Nicki Reaper, Your patient MALACHI SUDERMAN completed a FRAX assessment on 09/30/2018 using the [place] (analysis version: 14.10) manufactured by [employer]. The following summarizes the results of our evaluation. PATIENT BIOGRAPHICAL: Name: Ashima, Shrake Patient ID: [id number] Birth Date: [birth year] Height:    61.0 in. Gender:     Female    [age]       Weight:    143.9 lbs. Ethnicity:  White                            Exam Date: 09/30/2018 FRAX* RESULTS:  (version: 3.5) 10-year Probability of Fracture1 Major Osteoporotic Fracture2 Hip Fracture 8.1% 0.7% Population: [place] (Caucasian) Risk Factors: None Based on Femur (Left) Neck BMD 1 -The 10-year probability of fracture may be lower than reported if the patient has received treatment. 2 -Major Osteoporotic Fracture: Clinical Spine, Forearm, Hip or Shoulder *FRAX is a Materials engineer of the [employer] for Metabolic Bone Disease, a [place] (WHO) [facility]. ASSESSMENT: The probability of a major osteoporotic fracture is 8.1% within the next ten years. The probability of a hip fracture is 0.7% within the next ten years. . Electronically Signed   By: Franki Cabot M.D.   On: 09/30/2018 14:03   Mm 3d Screen Breast Bilateral  Result Date: 09/30/2018 CLINICAL  DATA:  Screening. EXAM: DIGITAL SCREENING BILATERAL MAMMOGRAM WITH TOMO AND CAD COMPARISON:  Previous exam(s). ACR Breast Density Category c: The breast tissue is heterogeneously dense, which may obscure small masses. FINDINGS: There are no findings suspicious for malignancy. Images were processed with CAD. IMPRESSION: No mammographic evidence of malignancy. A result letter of this screening mammogram will be mailed directly to the patient. RECOMMENDATION: Screening mammogram in one year. (Code:SM-B-01Y) BI-RADS CATEGORY  1: Negative. Electronically Signed   By: Margarette [place] M.D.   On: 09/30/2018 14:49       Assessment & Plan:   Problem List Items Addressed This Visit    Abnormal CT of the chest    Previously saw Dr Genevive Bi.  Due f/u chest CT.  She is agreeable for me to schedule.        Relevant Orders   CT Chest High Resolution   Aortic atherosclerosis ([place])    On crestor.        Carotid stenosis    Has seen Dr Lucky Cowboy.  See note.  Discussed f/u carotid ultrasound,etc.  She declines at this time.  Will notify me when agreeable.        Depression    Stable.  On  prozac.        GERD (gastroesophageal reflux disease)    Controlled on current regimen.        Hypercholesterolemia    On crestor.  Low cholesterol diet and exercise.  Follow lipid panel and liver function tests.        Hypothyroidism    On thyroid replacement.  Follow tsh.       Thrombocytosis ([place])    Follow cbc.       Type 2 diabetes mellitus with hyperglycemia ([facility])    On trulicity.  Sugars much improved.  Last a1c 6.9.  Discussed diet and exercise.  She has adjusted her diet.  Has lost weight.  Follow met b and a1c.        Other Visit Diagnoses    Abnormal findings on diagnostic imaging of lung       Relevant Orders   CT Chest High Resolution       Einar Pheasant, MD

## 2018-12-15 ENCOUNTER — Encounter: Payer: Self-pay | Admitting: Internal Medicine

## 2018-12-15 NOTE — Assessment & Plan Note (Signed)
On thyroid replacement.  Follow tsh.  

## 2018-12-15 NOTE — Assessment & Plan Note (Signed)
On trulicity.  Sugars much improved.  Last a1c 6.9.  Discussed diet and exercise.  She has adjusted her diet.  Has lost weight.  Follow met b and a1c.  

## 2018-12-15 NOTE — Assessment & Plan Note (Signed)
Follow cbc.  

## 2018-12-15 NOTE — Assessment & Plan Note (Signed)
Previously saw Dr Genevive Bi.  Due f/u chest CT.  She is agreeable for me to schedule.

## 2018-12-15 NOTE — Assessment & Plan Note (Signed)
On crestor.  Low cholesterol diet and exercise.  Follow lipid panel and liver function tests.   

## 2018-12-15 NOTE — Assessment & Plan Note (Signed)
Stable.  On prozac.   

## 2018-12-15 NOTE — Assessment & Plan Note (Signed)
Controlled on current regimen.   

## 2018-12-15 NOTE — Assessment & Plan Note (Signed)
On [facility].   

## 2018-12-15 NOTE — Assessment & Plan Note (Signed)
Has seen Dr Lucky Cowboy.  See note.  Discussed f/u carotid ultrasound,etc.  She declines at this time.  Will notify me when agreeable.

## 2018-12-25 ENCOUNTER — Ambulatory Visit: Payer: Medicare HMO

## 2019-01-01 ENCOUNTER — Other Ambulatory Visit: Payer: Self-pay | Admitting: Internal Medicine

## 2019-01-01 DIAGNOSIS — E1159 Type 2 diabetes mellitus with other circulatory complications: Secondary | ICD-10-CM

## 2019-01-01 MED ORDER — ACCU-CHEK SOFTCLIX LANCETS MISC: 0 refills | Status: DC

## 2019-01-01 MED ORDER — FLUTICASONE PROPIONATE 50 MCG/ACT NA SUSP: 2.0000 | Freq: Every day | NASAL | 3 refills | Status: DC

## 2019-01-01 MED ORDER — TRAZODONE HCL 50 MG PO TABS: 50.0000 mg | ORAL_TABLET | Freq: Every day | ORAL | 0 refills | Status: DC

## 2019-01-01 MED ORDER — GLUCOSE BLOOD VI STRP: ORAL_STRIP | 12 refills | Status: DC

## 2019-01-01 MED ORDER — BLOOD GLUCOSE MONITOR KIT: PACK | 0 refills | Status: DC

## 2019-01-01 NOTE — Telephone Encounter (Signed)
Copied from [place] [id number]. Topic: Quick Communication - See Telephone Encounter >> Jan 01, 2019  1:31 PM Ivar Drape wrote: CRM for notification. See Telephone encounter for: 01/01/19. Patient would like [employer] supply of the following medications and have them sent to her preferred pharmacy [facility]:  1) ACCU-CHEK SOFTCLIX LANCETS lancets 2) glucose blood (ONETOUCH VERIO) test strip 3) blood glucose meter kit and supplies KIT 4) traZODone (DESYREL) 50 MG tablet 5) fluticasone (FLONASE) 50 MCG/ACT nasal spray   Patient also stated she checks her glucose 4 times a day and she is not getting enough supplies to do this four times a day.

## 2019-02-10 ENCOUNTER — Ambulatory Visit (INDEPENDENT_AMBULATORY_CARE_PROVIDER_SITE_OTHER): Payer: Medicare HMO

## 2019-02-10 ENCOUNTER — Other Ambulatory Visit: Payer: Self-pay

## 2019-02-10 DIAGNOSIS — Z0000 Encounter for general adult medical examination without abnormal findings: Secondary | ICD-10-CM | POA: Diagnosis not present

## 2019-02-10 DIAGNOSIS — Z1211 Encounter for screening for malignant neoplasm of colon: Secondary | ICD-10-CM

## 2019-02-10 NOTE — Patient Instructions (Addendum)
  Jody Wang , Thank you for taking time to come for your Medicare Wellness Visit. I appreciate your ongoing commitment to your health goals. Please review the following plan we discussed and let me know if I can assist you in the future.   These are the goals we discussed: Goals      Patient Stated   . DIET - INCREASE WATER INTAKE (pt-stated)     Try to drink more water    . Increase physical activity (pt-stated)     Walk more for exercise       This is a list of the screening recommended for you and due dates:  Health Maintenance  Topic Date Due  . Tetanus Vaccine  10/08/1964  . Eye exam for diabetics  01/24/2017  . Cologuard (Stool DNA test)  11/16/2018  . Hemoglobin A1C  04/08/2019  . Complete foot exam   04/10/2019  . Urine Protein Check  04/19/2019  . Flu Shot  06/07/2019  . Mammogram  09/30/2020  . DEXA scan (bone density measurement)  Completed  .  Hepatitis C: One time screening is recommended by Center for Disease Control  (CDC) for  adults born from [birth year] through 1966.   Completed  . Pneumonia vaccines  Completed

## 2019-02-10 NOTE — Progress Notes (Signed)
Subjective:   Jody Wang is a 73 y.o. female who presents for Medicare Annual (Subsequent) preventive examination.  Review of Systems:  No ROS.  Medicare Wellness Visit. Additional risk factors are reflected in the social history. Cardiac Risk Factors include: advanced age (>21mn, >>91women);diabetes mellitus     Objective:     Vitals: There were no vitals taken for this visit.  There is no height or weight on file to calculate BMI.   Vital signs UTA, virtual visit  Advanced Directives 02/10/2019 02/08/2018 04/13/2017 11/30/2016 03/15/2016 02/16/2016 11/30/2015  Does Patient Have a Medical Advance Directive? No No Yes No No No No  Type of Advance Directive - - HJensen Does patient want to make changes to medical advance directive? - - - No - Patient declined - - -  Would patient like information on creating a medical advance directive? No - Patient declined No - Patient declined - No - Patient declined No - patient declined information No - patient declined information Yes - EScientist, clinical (histocompatibility and immunogenetics)given    Tobacco Social History   Tobacco Use  Smoking Status Former Smoker  . Packs/day: 1.00  . Years: 23.00  . Pack years: 23.00  . Last attempt to quit: 11/20/1983  . Years since quitting: 35.2  Smokeless Tobacco Never Used     Counseling given: Not Answered   Clinical Intake:  Pre-visit preparation completed: Yes        Diabetes: Yes  How often do you need to have someone help you when you read instructions, pamphlets, or other written materials from your doctor or pharmacy?: 1 - Never  Interpreter Needed?: No     Past Medical History:  Diagnosis Date  . Depression   . Diabetes mellitus without complication ([place]   . GERD (gastroesophageal reflux disease)   . Heart murmur    slight - followed by PCP  . Hypercholesterolemia   . Hypothyroidism   . Osteoarthritis of knee   . Wears dentures    partial lower   Past Surgical  History:  Procedure Laterality Date  . ABDOMINAL HYSTERECTOMY  1985   ovaries not removed  . BREAST CYST EXCISION Right   . BREAST CYST EXCISION Left   . CATARACT EXTRACTION W/PHACO Right 02/16/2016   Procedure: CATARACT EXTRACTION PHACO AND INTRAOCULAR LENS PLACEMENT ([place] RIGHT ;  Surgeon: CLeandrew Koyanagi MD;  Location: [facility]  Service: Ophthalmology;  Laterality: Right;  DIABETIC - oral meds  . CATARACT EXTRACTION W/PHACO Left 03/15/2016   Procedure: CATARACT EXTRACTION PHACO AND INTRAOCULAR LENS PLACEMENT (IOC)left eye;  Surgeon: CLeandrew Koyanagi MD;  [facility]  Service: Ophthalmology;  Laterality: Left;  DIABETIC - oral meds  . INNER EAR SURGERY     Family History  Problem Relation Age of Onset  . Diabetes Father   . Coronary artery disease Father        s/p CABG  . Hypertension Father   . Hypercholesterolemia Father   . Hypertension Brother   . Diabetes Brother   . Hypertension Sister   . Glaucoma Sister   . Diabetes Other        niece  . Breast cancer Maternal Aunt   . Breast cancer Paternal Aunt   . Cervical cancer Paternal Aunt   . Breast cancer Cousin   . Breast cancer Other    Social History   Socioeconomic History  . Marital status: Widowed    Spouse  name: Not on file  . Number of children: 3  . Years of education: Not on file  . Highest education level: Not on file  Occupational History  . Not on file  Social Needs  . Financial resource strain: Not hard at all  . Food insecurity:    Worry: Never true    Inability: Never true  . Transportation needs:    Medical: No    Non-medical: No  Tobacco Use  . Smoking status: Former Smoker    Packs/day: 1.00    Years: 23.00    Pack years: 23.00    Last attempt to quit: 11/20/1983    Years since quitting: 35.2  . Smokeless tobacco: Never Used  Substance and Sexual Activity  . Alcohol use: Yes    Alcohol/week: 0.0 standard drinks    Comment: rare  . Drug use: No  .  Sexual activity: Never  Lifestyle  . Physical activity:    Days per week: Not on file    Minutes per session: Not on file  . Stress: Not on file  Relationships  . Social connections:    Talks on phone: Not on file    Gets together: Not on file    Attends religious service: Not on file    Active member of club or organization: Not on file    Attends meetings of clubs or organizations: Not on file    Relationship status: Not on file  Other Topics Concern  . Not on file  Social History Narrative  . Not on file    Outpatient Encounter Medications as of 02/10/2019  Medication Sig  . ACCU-CHEK SOFTCLIX LANCETS lancets TEST BLOOD SUGAR UP TO FOUR TIMES DAILY AS DIRECTED  . aspirin 81 MG chewable tablet Chew 81 mg by mouth daily.  . blood glucose meter kit and supplies KIT Dispense based on patient and insurance preference. Use up to four times daily as directed. (FOR ICD-9 250.00, 250.01).  . cholecalciferol (VITAMIN D) 1000 units tablet Take 1,000 Units by mouth daily.  . cyanocobalamin 1000 MCG tablet Take 1,000 mcg by mouth daily.  . diclofenac (VOLTAREN) 50 MG EC tablet TAKE 1 TABLET TWICE DAILY AS NEEDED  . Dulaglutide (TRULICITY) 9.23 RA/0.7MA SOPN Inject 0.75 mg into the skin once a week.  Marland Kitchen FLUoxetine (PROZAC) 20 MG capsule TAKE 1 CAPSULE EVERY DAY  . fluticasone (FLONASE) 50 MCG/ACT nasal spray Place 2 sprays into both nostrils daily.  Marland Kitchen glucose blood (ONETOUCH VERIO) test strip Use as instructed to check blood sugars twice daily. Dx: E 11.9  . glucose blood test strip Use as instructed to check blood sugars to check blood sugars twice daily. Dx E11.9  . levocetirizine (XYZAL) 5 MG tablet TAKE 1 TABLET (5 MG TOTAL) BY MOUTH DAILY.  Marland Kitchen levothyroxine (SYNTHROID, LEVOTHROID) 88 MCG tablet TAKE 1 TABLET (88 MCG TOTAL) BY MOUTH DAILY.  . metFORMIN (GLUCOPHAGE-XR) 500 MG 24 hr tablet Take 1 tablet (500 mg total) by mouth 2 (two) times daily. (Patient taking differently: Take 500 mg by  mouth 4 (four) times daily. Take 2 tablets after first meal of the day. Take 2 tablets after the last meal of the day.)  . pantoprazole (PROTONIX) 40 MG tablet TAKE 1 TABLET TWICE DAILY BEFORE MEALS  . rosuvastatin (CRESTOR) 40 MG tablet Take 1 tablet (40 mg total) by mouth daily.  . traZODone (DESYREL) 50 MG tablet Take 1 tablet (50 mg total) by mouth daily.  . pneumococcal 23 valent vaccine (PNU-IMMUNE)  25 MCG/0.5ML injection Pneumovax-23 25 mcg/0.5 mL injection solution  inject 0.5 milliliter intramuscularly   No facility-administered encounter medications on file as of 02/10/2019.     Activities of Daily Living In your present state of health, do you have any difficulty performing the following activities: 02/10/2019  Hearing? Y  Vision? N  Difficulty concentrating or making decisions? N  Walking or climbing stairs? Y  Dressing or bathing? N  Doing errands, shopping? N  Preparing Food and eating ? N  Using the Toilet? N  In the past six months, have you accidently leaked urine? N  Do you have problems with loss of bowel control? N  Managing your Medications? N  Managing your Finances? N  Housekeeping or managing your Housekeeping? N  Some recent data might be hidden    Patient Care Team: Einar Pheasant, MD as PCP - General (Internal Medicine) Minna Merritts, MD as Consulting Physician (Cardiology)    Assessment:   This is a routine wellness examination for [place].  I connected with Enid Derry on 02/10/19 at 11:30 AM EDT by a video enabled telemedicine application and verified that I am speaking with the correct person using two identifiers.  Diabetes- manages closely. Reports her current FBS 130-150. Notes a change in therapy at last OV but BS continued to rise.  Now taking metformin 1050m after first and last meal of the day.  Health Screenings  Mammogram -09/30/18  Cologuard-(11/17/15) Discussed; ordered this visit. Bone Density -09/30/18 Glaucoma -none Hearing  -difficulty hearing in a crowd. [facility]  Hemoglobin A1C -04/18/17 (8.9) Cholesterol - 04/18/18 (129) Dental- every 6 months Vision- annual visits. Plans to schedule soon.   Social  Alcohol intake -yes, rare Smoking history- former Smokers in home? none Illicit drug use? none Exercise -walking Diet -regular Sexually Active -no  Safety  Patient feels safe at home. Son lives in the home.  Patient does have smoke detectors at home  Patient does wear sunscreen or protective clothing when in direct sunlight. Patient does wear seat belt when driving or riding with others.   Activities of Daily Living Patient can do their own household chores. Denies needing assistance with: driving, feeding themselves, getting from bed to chair, getting to the toilet, bathing/showering, dressing, managing money, or preparing meals.   Depression Screen Patient denies losing interest in daily life, feeling hopeless, or crying easily over simple problems.   Fall Screen Patient denies being afraid of falling or falling in the last year.   Memory Screen Patient denies problems with memory, misplacing items, and is able to balance checkbook/bank accounts.  Patient is alert, normal appearance, oriented to person/place/and time. Correctly identified the president of the [employer] recall of 2/3 objects, and performing simple calculations.  Patient displays appropriate judgement and can read correct time from watch face.   Immunizations The following Immunizations were discussed: Influenza, shingles, pneumonia, and tetanus.   Other Providers Patient Care Team: SEinar Pheasant MD as PCP - General (Internal Medicine) GMinna Merritts MD as Consulting Physician (Cardiology)  Exercise Activities and Dietary recommendations Current Exercise Habits: Home exercise routine, Type of exercise: walking, Frequency (Times/Week): 3, Intensity: Mild  Goals      Patient Stated    . DIET - INCREASE WATER INTAKE (pt-stated)     Try to drink more water    . Increase physical activity (pt-stated)     Walk more for exercise       Fall Risk Fall Risk  02/10/2019 02/08/2018 11/30/2016 09/25/2016 07/17/2016  Falls in the past year? 0 No No No No  Number falls in past yr: - - - - -  Injury with Fall? - - - - -  Comment - - - - -  Follow up - - - - -    Depression Screen PHQ 2/9 Scores 02/10/2019 02/08/2018 11/30/2016 09/25/2016  PHQ - 2 Score 0 0 1 0  PHQ- 9 Score - - - -     Cognitive Function MMSE - Mini Mental State Exam 11/30/2015  Orientation to time 5  Orientation to Place 5  Registration 3  Attention/ Calculation 5  Recall 3  Language- name 2 objects 2  Language- repeat 1  Language- follow 3 step command 3  Language- read & follow direction 1  Write a sentence 1  Copy design 1  Total score 30     6CIT Screen 02/10/2019 02/08/2018 11/30/2016  What Year? 0 points 0 points 0 points  What month? 0 points 0 points 0 points  What time? 0 points 0 points 0 points  Count back from 20 0 points 0 points 0 points  Months in reverse 0 points 0 points 0 points  Repeat phrase 0 points 0 points 0 points  Total Score 0 0 0    Immunization History  Administered Date(s) Administered  . Influenza Split 08/24/2013, 08/20/2014  . Influenza, High Dose Seasonal PF 09/25/2016, 07/11/2018  . Influenza,inj,Quad PF,6+ Mos 07/09/2015  . Influenza-Unspecified 08/10/2017  . Pneumococcal Conjugate-13 10/08/2015  . Pneumococcal Polysaccharide-23 07/01/2013  . Zoster 08/24/2013   Screening Tests Health Maintenance  Topic Date Due  . TETANUS/TDAP  10/08/1964  . OPHTHALMOLOGY EXAM  01/24/2017  . Fecal DNA (Cologuard)  11/16/2018  . HEMOGLOBIN A1C  04/08/2019  . FOOT EXAM  04/10/2019  . URINE MICROALBUMIN  04/19/2019  . INFLUENZA VACCINE  06/07/2019  . MAMMOGRAM  09/30/2020  . DEXA SCAN  Completed  . Hepatitis C Screening  Completed  . PNA vac Low Risk Adult  Completed        Plan:    End of life planning; Advanced aging; Advanced directives discussed.  No HCPOA/Living Will.  Additional information declined at this time.  Keep all routine scheduled appointments.   I have personally reviewed and noted the following in the patient's chart:   . Medical and social history . Use of alcohol, tobacco or illicit drugs  . Current medications and supplements . Functional ability and status . Nutritional status . Physical activity . Advanced directives . List of other physicians . Hospitalizations, surgeries, and ER visits in previous 12 months . Vitals . Screenings to include cognitive, depression, and falls . Referrals and appointments  In addition, I have reviewed and discussed with patient certain preventive protocols, quality metrics, and best practice recommendations. A written personalized care plan for preventive services as well as general preventive health recommendations were provided to patient.     Varney Biles, LPN  05/08/7105   Reviewed above information.  Agree with assessment and plan.    Dr Nicki Reaper

## 2019-02-11 ENCOUNTER — Ambulatory Visit: Payer: Medicare HMO

## 2019-02-24 DIAGNOSIS — Z1211 Encounter for screening for malignant neoplasm of colon: Secondary | ICD-10-CM | POA: Diagnosis not present

## 2019-02-24 LAB — COLOGUARD: Cologuard: NEGATIVE

## 2019-02-26 ENCOUNTER — Telehealth: Payer: Self-pay | Admitting: Pharmacist

## 2019-02-26 DIAGNOSIS — E1159 Type 2 diabetes mellitus with other circulatory complications: Secondary | ICD-10-CM

## 2019-02-26 NOTE — Telephone Encounter (Signed)
Noted.  Hold for form.

## 2019-02-26 NOTE — Telephone Encounter (Signed)
Received fax from [facility] requesting updated refill information and permission to ship Trulicity to patient's home instead of provider's office. Contacted patient; she agreed to this.   Will fax refill form to Dr. Nicki Reaper for sign/date and fax to [facility].   Catie Darnelle Maffucci, PharmD, [place] PGY2 Ambulatory Care Pharmacy Resident, [place] [facility] Phone: [phone]

## 2019-02-27 NOTE — Telephone Encounter (Signed)
Faxed to [facility]

## 2019-02-27 NOTE — Telephone Encounter (Signed)
Form received, placed out for signature

## 2019-02-27 NOTE — Telephone Encounter (Signed)
Signed and placed in box.   

## 2019-03-10 MED ORDER — DULAGLUTIDE 0.75 MG/0.5ML ~~LOC~~ SOAJ: 0.7500 mg | SUBCUTANEOUS | 2 refills | Status: DC

## 2019-03-10 NOTE — Addendum Note (Signed)
Addended by: De Hollingshead on: 03/10/2019 01:46 PM   Modules accepted: Orders

## 2019-03-10 NOTE — Telephone Encounter (Signed)
Received another fax that a 4 month supply updated prescription was needed for patient. Send electronically to [facility].   Catie Darnelle Maffucci, PharmD, [place] PGY2 Ambulatory Care Pharmacy Resident, [place] [facility] Phone: [phone]

## 2019-03-17 ENCOUNTER — Other Ambulatory Visit: Payer: Self-pay | Admitting: Internal Medicine

## 2019-03-18 ENCOUNTER — Telehealth: Payer: Self-pay

## 2019-03-18 NOTE — Telephone Encounter (Signed)
Copied from [place] [id number]. Topic: General - Other >> Mar 17, 2019 12:56 PM Yvette Rack wrote: Reason for CRM: Pt called in for cologuard results. Pt requests call back.   Called pt and informed her that a letter was mailed with her cologuard results and they were negative, pt understood.  Nina,mca

## 2019-03-26 ENCOUNTER — Other Ambulatory Visit: Payer: Self-pay | Admitting: Pharmacy Technician

## 2019-03-26 ENCOUNTER — Other Ambulatory Visit: Payer: Self-pay | Admitting: Internal Medicine

## 2019-03-26 NOTE — Patient Outreach (Signed)
[facility]) Care Management  03/26/2019  Jody Wang 22-Dec-1945 [id number]    Care coordination call placed to [place] cares in regards to patient's shipment status on Truclity.  Spoke to [place] who informed that the pharmacy, [facility], was in need of a prescription for the Trulicty. Informed [name] that the prescription was faxed on February 26, 2019 and e scribed on 03/10/2019. [name] informed that [facility] recently changed their fax number. She informed the provider would either need to call or fax in the prescription. The phone number is [phone] and fax number is [phone].  Will route note to [place] the embedded clinical pharmacist.  [name]. [name], [place] Management [phone]

## 2019-03-28 DIAGNOSIS — J301 Allergic rhinitis due to pollen: Secondary | ICD-10-CM | POA: Diagnosis not present

## 2019-03-28 DIAGNOSIS — H90A32 Mixed conductive and sensorineural hearing loss, unilateral, left ear with restricted hearing on the contralateral side: Secondary | ICD-10-CM | POA: Diagnosis not present

## 2019-03-28 DIAGNOSIS — H6122 Impacted cerumen, left ear: Secondary | ICD-10-CM | POA: Diagnosis not present

## 2019-04-02 ENCOUNTER — Other Ambulatory Visit: Payer: Self-pay | Admitting: Internal Medicine

## 2019-04-08 ENCOUNTER — Other Ambulatory Visit: Payer: Medicare HMO

## 2019-04-15 ENCOUNTER — Encounter: Payer: Medicare HMO | Admitting: Internal Medicine

## 2019-05-19 ENCOUNTER — Telehealth: Payer: Self-pay | Admitting: Internal Medicine

## 2019-05-19 NOTE — Telephone Encounter (Signed)
Medication Refill - Medication: trazadone, levothyroxine, rosuvastatin, fluticasone  Has the patient contacted their pharmacy? No. (Agent: If no, request that the patient contact the pharmacy for the refill.) (Agent: If yes, when and what did the pharmacy advise?)  Preferred Pharmacy (with phone number or street name):  [place], [place]  [place] [address]  Phone: [phone] Fax: [phone]  Not a 24 hour pharmacy; exact hours not known.     Agent: Please be advised that RX refills may take up to 3 business days. We ask that you follow-up with your pharmacy.

## 2019-05-20 ENCOUNTER — Other Ambulatory Visit: Payer: Self-pay

## 2019-05-20 MED ORDER — ROSUVASTATIN CALCIUM 40 MG PO TABS: 40.0000 mg | ORAL_TABLET | Freq: Every day | ORAL | 3 refills | Status: DC

## 2019-05-20 MED ORDER — LEVOTHYROXINE SODIUM 88 MCG PO TABS: 88.0000 ug | ORAL_TABLET | Freq: Every day | ORAL | 3 refills | Status: DC

## 2019-05-20 MED ORDER — FLUTICASONE PROPIONATE 50 MCG/ACT NA SUSP: 2.0000 | Freq: Every day | NASAL | 3 refills | Status: DC

## 2019-05-20 MED ORDER — TRAZODONE HCL 50 MG PO TABS: 50.0000 mg | ORAL_TABLET | Freq: Every day | ORAL | 1 refills | Status: DC

## 2019-05-20 NOTE — Telephone Encounter (Signed)
Prescriptions sent in to [facility]

## 2019-05-20 NOTE — Telephone Encounter (Signed)
Left message to let pt know rx was sent in

## 2019-05-22 ENCOUNTER — Telehealth: Payer: Self-pay

## 2019-05-22 NOTE — Telephone Encounter (Signed)
Confirmed pt taking crestor as directed

## 2019-06-18 ENCOUNTER — Other Ambulatory Visit: Payer: Self-pay | Admitting: Internal Medicine

## 2019-07-26 ENCOUNTER — Other Ambulatory Visit: Payer: Self-pay | Admitting: Internal Medicine

## 2019-08-12 ENCOUNTER — Other Ambulatory Visit: Payer: Medicare HMO

## 2019-08-14 ENCOUNTER — Encounter: Payer: Medicare HMO | Admitting: Internal Medicine

## 2019-08-25 ENCOUNTER — Other Ambulatory Visit: Payer: Self-pay | Admitting: Internal Medicine

## 2019-08-25 DIAGNOSIS — Z1231 Encounter for screening mammogram for malignant neoplasm of breast: Secondary | ICD-10-CM

## 2019-08-27 ENCOUNTER — Other Ambulatory Visit: Payer: Self-pay | Admitting: Internal Medicine

## 2019-08-31 ENCOUNTER — Other Ambulatory Visit: Payer: Self-pay | Admitting: Internal Medicine

## 2019-10-06 ENCOUNTER — Ambulatory Visit
Admission: RE | Admit: 2019-10-06 | Discharge: 2019-10-06 | Disposition: A | Discharge status: Home / Self Care | Payer: Medicare HMO | Source: Ambulatory Visit | Attending: Internal Medicine | Admitting: Internal Medicine

## 2019-10-06 DIAGNOSIS — Z1231 Encounter for screening mammogram for malignant neoplasm of breast: Secondary | ICD-10-CM | POA: Diagnosis not present

## 2019-10-08 ENCOUNTER — Other Ambulatory Visit: Payer: Self-pay | Admitting: Internal Medicine

## 2019-10-08 DIAGNOSIS — Z760 Encounter for issue of repeat prescription: Secondary | ICD-10-CM

## 2019-10-08 MED ORDER — ONETOUCH ULTRASOFT LANCETS MISC: 5 refills | Status: DC

## 2019-10-08 NOTE — Telephone Encounter (Signed)
Requested Prescriptions  Pending Prescriptions Disp Refills  . Lancets (ONETOUCH ULTRASOFT) lancets 200 each 5    Sig: TEST BLOOD SUGAR TWICE DAILY     Endocrinology: Diabetes - Testing Supplies Passed - 10/08/2019 12:12 PM      Passed - Valid encounter within last 12 months    Recent Outpatient Visits          10 months ago Bilateral carotid artery stenosis   [facility] [place], [place], MD   1 year ago Type 2 diabetes mellitus with hyperglycemia, without long-term current use of insulin [facility])   [place], [place]   1 year ago Type 2 diabetes mellitus with other circulatory complication, without long-term current use of insulin [facility])   [facility] De Hollings[place], [place]   1 year ago Estrogen deficiency   [facility] [place] Einar Pheasant, MD   1 year ago Need for influenza vaccination   [place], Jacquelynn Cree, FNP      Future Appointments            In 1 month Einar Pheasant, MD [facility], [facility]

## 2019-10-08 NOTE — Telephone Encounter (Signed)
Requested medication (s) are due for refill today: yes  Requested medication (s) are on the active medication list: yes  Last refill:  10/21/2018  Future visit scheduled: yes  Notes to clinic:  Last refilled by Dr. Derrel Nip Review for refill   Requested Prescriptions  Pending Prescriptions Disp Refills   diclofenac (VOLTAREN) 50 MG EC tablet 180 tablet 0    Sig: Take 1 tablet (50 mg total) by mouth 2 (two) times daily as needed.     Analgesics:  NSAIDS Failed - 10/08/2019 11:30 AM      Failed - Cr in normal range and within 360 days    Creat  Date Value Ref Range Status  11/05/2014 0.71 0.50 - 1.10 mg/dL Final   Creatinine, Ser  Date Value Ref Range Status  04/18/2018 0.84 0.40 - 1.20 mg/dL Final         Failed - HGB in normal range and within 360 days    Hemoglobin  Date Value Ref Range Status  04/18/2018 12.2 12.0 - 15.0 g/dL Final         Passed - Patient is not pregnant      Passed - Valid encounter within last 12 months    Recent Outpatient Visits          10 months ago Bilateral carotid artery stenosis   [facility] [place], [place], MD   1 year ago Type 2 diabetes mellitus with hyperglycemia, without long-term current use of insulin [facility])   [place], [place]   1 year ago Type 2 diabetes mellitus with other circulatory complication, without long-term current use of insulin [facility])   [place], [place], [place]   1 year ago Estrogen deficiency   [facility] [place] Einar Pheasant, MD   1 year ago Need for influenza vaccination   [place]use, Jacquelynn Cree, FNP      Future Appointments            In 1 month Einar Pheasant, MD [place], [facility]

## 2019-10-08 NOTE — Telephone Encounter (Signed)
Medication Refill - Medication:  Lancets (ONETOUCH Verio) lancets   Has the patient contacted their pharmacy? Yes advised to call office.   Preferred Pharmacy (with phone number or street name):  [place], [place] [phone] (Phone) [phone] (Fax)   Agent: Please be advised that RX refills may take up to 3 business days. We ask that you follow-up with your pharmacy.

## 2019-10-08 NOTE — Telephone Encounter (Signed)
Medication Refill - Medication:  diclofenac (VOLTAREN) 50 MG EC tablet  Has the patient contacted their pharmacy? Yes advised to call.   Preferred Pharmacy (with phone number or street name):  [place], [place] [phone] (Phone) [phone] (Fax)   Agent: Please be advised that RX refills may take up to 3 business days. We ask that you follow-up with your pharmacy.

## 2019-10-16 NOTE — Telephone Encounter (Signed)
LM for pt to call back. Need to know if she still needs and how often she is using.

## 2019-11-11 ENCOUNTER — Other Ambulatory Visit: Payer: Medicare HMO

## 2019-11-13 ENCOUNTER — Other Ambulatory Visit: Payer: Self-pay | Admitting: Internal Medicine

## 2019-11-13 DIAGNOSIS — E1165 Type 2 diabetes mellitus with hyperglycemia: Secondary | ICD-10-CM

## 2019-11-14 ENCOUNTER — Encounter: Payer: Medicare HMO | Admitting: Internal Medicine

## 2019-11-16 ENCOUNTER — Other Ambulatory Visit: Payer: Self-pay | Admitting: Internal Medicine

## 2020-01-02 ENCOUNTER — Ambulatory Visit: Payer: Medicare PPO | Attending: Internal Medicine

## 2020-01-02 DIAGNOSIS — Z23 Encounter for immunization: Secondary | ICD-10-CM

## 2020-01-02 NOTE — Progress Notes (Signed)
   Covid-19 Vaccination Clinic  Name:  Jody Wang    MRN: [id number] DOB: [birth year]  01/02/2020  Jody Wang was observed post Covid-19 immunization for 15 minutes without incidence. She was provided with Vaccine Information Sheet and instruction to access the V-Safe system.   Jody Wang was instructed to call 911 with any severe reactions post vaccine: Marland Kitchen Difficulty breathing  . Swelling of your face and throat  . A fast heartbeat  . A bad rash all over your body  . Dizziness and weakness    Immunizations Administered    Name Date Dose VIS Date Route   Pfizer COVID-19 Vaccine 01/02/2020  3:57 PM 0.3 mL 10/17/2019 Intramuscular   Manufacturer: [place]   Lot: [id number]   [place]: [id number]

## 2020-01-14 ENCOUNTER — Telehealth: Payer: Self-pay | Admitting: Internal Medicine

## 2020-01-14 NOTE — Telephone Encounter (Signed)
Pt scheduled for COVID test at [facility] for tomorrow at 2:45. Virtual scheduled with Dr Nicki Reaper on 3/11 at 4:30. Advised son that lives with her that he should be tested as well.

## 2020-01-14 NOTE — Telephone Encounter (Signed)
Pt got her first Covid Vaccine on 2/26 and now has started to have headaches,no taste or smell, coughing  Pt son tested positive today for Covid she was last around him on 2/28 Pt is wanting to know if she needs to be tested and wants to make an appt with Dr. Nicki Reaper

## 2020-01-15 ENCOUNTER — Ambulatory Visit: Payer: Medicare PPO | Attending: Internal Medicine

## 2020-01-15 ENCOUNTER — Telehealth (INDEPENDENT_AMBULATORY_CARE_PROVIDER_SITE_OTHER): Payer: Medicare HMO | Admitting: Internal Medicine

## 2020-01-15 ENCOUNTER — Other Ambulatory Visit: Payer: Self-pay

## 2020-01-15 DIAGNOSIS — E1165 Type 2 diabetes mellitus with hyperglycemia: Secondary | ICD-10-CM

## 2020-01-15 DIAGNOSIS — Z20822 Contact with and (suspected) exposure to covid-19: Secondary | ICD-10-CM

## 2020-01-15 DIAGNOSIS — R05 Cough: Secondary | ICD-10-CM | POA: Diagnosis not present

## 2020-01-15 DIAGNOSIS — R059 Cough, unspecified: Secondary | ICD-10-CM

## 2020-01-15 NOTE — Progress Notes (Signed)
Patient ID: Jody Wang, female   DOB: 01/19/1946, 74 y.o.   MRN: [id number]   Virtual Visit via video Note  This visit type was conducted due to national recommendations for restrictions regarding the COVID-19 pandemic (e.g. social distancing).  This format is felt to be most appropriate for this patient at this time.  All issues noted in this document were discussed and addressed.  No physical exam was performed (except for noted visual exam findings with Video Visits).   I connected with Jody Wang by a video enabled telemedicine application and verified that I am speaking with the correct person using two identifiers. Location patient: home Location provider: work  Persons participating in the virtual visit: patient, provider  I discussed the limitations, risks, security and privacy concerns of performing an evaluation and management service by telephone and the availability of in person appointments. The patient expressed understanding and agreed to proceed.   Reason for visit: work in appt  HPI: Reports started having symptoms 01/07/20.  Reported started having chills.  Was at the beach with her [place].  Came home and symptoms progressed.  Developed headaches, cough and nasal congestion.  Clear mucus production.  Reports productive cough. Has noticed some green color to this mucus.  No sob.  No chest pain.  One episode of diarrhea.  Taking tylenol.  Her son tested positive recently.  Another son lives with her.  He is asymptomatic.  No nausea or vomiting.  No abdominal pain.  Received first covid vaccine 12/09/19.  Has lost her sense of taste and smell.     ROS: See pertinent positives and negatives per HPI.  Past Medical History:  Diagnosis Date  . Depression   . Diabetes mellitus without complication ([place])   . GERD (gastroesophageal reflux disease)   . Hypercholesterolemia   . Hypothyroidism   . Osteoarthritis of knee   . Wears dentures    partial lower    Past  Surgical History:  Procedure Laterality Date  . ABDOMINAL HYSTERECTOMY  1985   ovaries not removed  . BREAST CYST EXCISION Right   . BREAST CYST EXCISION Left   . CATARACT EXTRACTION W/PHACO Right 02/16/2016   Procedure: CATARACT EXTRACTION PHACO AND INTRAOCULAR LENS PLACEMENT ([place]) RIGHT ;  Surgeon: Leandrew Koyanagi, MD;  Location: [facility];  Service: Ophthalmology;  Laterality: Right;  DIABETIC - oral meds  . CATARACT EXTRACTION W/PHACO Left 03/15/2016   Procedure: CATARACT EXTRACTION PHACO AND INTRAOCULAR LENS PLACEMENT ([facility])left eye;  Surgeon: Leandrew Koyanagi, MD;  Location: [facility];  Service: Ophthalmology;  Laterality: Left;  DIABETIC - oral meds  . INNER EAR SURGERY      Family History  Problem Relation Age of Onset  . Diabetes Father   . Coronary artery disease Father        s/p CABG  . Hypertension Father   . Hypercholesterolemia Father   . Hypertension Brother   . Diabetes Brother   . Hypertension Sister   . Glaucoma Sister   . Diabetes Other        niece  . Breast cancer Maternal Aunt   . Breast cancer Paternal Aunt   . Cervical cancer Paternal Aunt   . Breast cancer Cousin   . Breast cancer Other     SOCIAL HX: reviewed.    Current Outpatient Medications:  .  aspirin 81 MG chewable tablet, Chew 81 mg by mouth daily., Disp: , Rfl:  .  blood glucose meter kit and supplies KIT,  Dispense based on patient and insurance preference. Use up to four times daily as directed. (FOR ICD-9 250.00, 250.01)., Disp: 1 each, Rfl: 0 .  cholecalciferol (VITAMIN D) 1000 units tablet, Take 1,000 Units by mouth daily., Disp: , Rfl:  .  cyanocobalamin 1000 MCG tablet, Take 1,000 mcg by mouth daily., Disp: , Rfl:  .  diclofenac (VOLTAREN) 50 MG EC tablet, TAKE 1 TABLET TWICE DAILY AS NEEDED, Disp: 180 tablet, Rfl: 0 .  Dulaglutide (TRULICITY) 6.21 HY/8.6VH SOPN, Inject 0.75 mg into the skin once a week., Disp: 20 pen, Rfl: 2 .  FLUoxetine (PROZAC) 20 MG  capsule, TAKE 1 CAPSULE EVERY DAY, Disp: 90 capsule, Rfl: 1 .  fluticasone (FLONASE) 50 MCG/ACT nasal spray, Place 2 sprays into both nostrils daily., Disp: 48 g, Rfl: 3 .  glucose blood (ONETOUCH VERIO) test strip, Use as instructed to check blood sugars twice daily. Dx: E 11.9, Disp: 100 each, Rfl: 12 .  glucose blood test strip, Use as instructed to check blood sugars to check blood sugars twice daily. Dx E11.9, Disp: 100 each, Rfl: 12 .  Lancets (ONETOUCH ULTRASOFT) lancets, TEST BLOOD SUGAR TWICE DAILY, Disp: 200 each, Rfl: 5 .  levocetirizine (XYZAL) 5 MG tablet, TAKE 1 TABLET EVERY DAY, Disp: 90 tablet, Rfl: 1 .  levothyroxine (SYNTHROID) 88 MCG tablet, Take 1 tablet (88 mcg total) by mouth daily., Disp: 90 tablet, Rfl: 3 .  metFORMIN (GLUCOPHAGE-XR) 500 MG 24 hr tablet, TAKE 1 TABLET TWICE DAILY, Disp: 180 tablet, Rfl: 3 .  ONETOUCH VERIO test strip, TEST BLOOD SUGAR TWICE DAILY AS DIRECTED, Disp: 200 strip, Rfl: 3 .  pantoprazole (PROTONIX) 40 MG tablet, TAKE 1 TABLET TWICE DAILY BEFORE MEALS, Disp: 180 tablet, Rfl: 1 .  rosuvastatin (CRESTOR) 40 MG tablet, Take 1 tablet (40 mg total) by mouth daily., Disp: 90 tablet, Rfl: 3 .  traZODone (DESYREL) 50 MG tablet, TAKE 1 TABLET EVERY DAY, Disp: 90 tablet, Rfl: 1  EXAM:  GENERAL: alert, oriented, appears well and in no acute distress  HEENT: atraumatic, conjunttiva clear, no obvious abnormalities on inspection of external nose and ears  NECK: normal movements of the head and neck  LUNGS: on inspection no signs of respiratory distress, breathing rate appears normal, no obvious gross SOB, gasping or wheezing  CV: no obvious cyanosis  PSYCH/NEURO: pleasant and cooperative, no obvious depression or anxiety, speech and thought processing grossly intact  ASSESSMENT AND PLAN:  Discussed the following assessment and plan:  Cough Increased cough and congestion as outlined.  Also associated with loss of taste and smell with nasal  congestion.  Symptoms appear to be c/w covid.  Went to be tested today.  Results pending.  No chest pain or sob.  Hold on prednisone.  nasacort nasal spray and robitussin DM as outlined.  Rest.  Fluids.  Follow.  Taking tylenol.  Feels some better today.  Follow closely.  Discussed monoclonal antibody infusion if covid positive.  Symptoms started 01/07/20. May be out of window by the time get results.  Follow.    Type 2 diabetes mellitus with hyperglycemia (HCC) Follow sugars when sick.  On trulicity and metformin.      I discussed the assessment and treatment plan with the patient. The patient was provided an opportunity to ask questions and all were answered. The patient agreed with the plan and demonstrated an understanding of the instructions.   The patient was advised to call back or seek an in-person evaluation if the symptoms worsen  or if the condition fails to improve as anticipated.   Einar Pheasant, MD

## 2020-01-16 LAB — NOVEL CORONAVIRUS, NAA: SARS-CoV-2, NAA: DETECTED — AB

## 2020-01-17 ENCOUNTER — Encounter: Payer: Self-pay | Admitting: Physician Assistant

## 2020-01-17 ENCOUNTER — Telehealth: Payer: Self-pay | Admitting: Physician Assistant

## 2020-01-17 NOTE — Telephone Encounter (Signed)
Called to discuss with patient about Covid symptoms and the use of bamlanivimab or casirivimab/imdevimab, a monoclonal antibody infusion for those with mild to moderate Covid symptoms and at a high risk of hospitalization.  Pt is qualified for this infusion at the [facility] infusion center due to Age > 65 , HTN, DMT2  Message left to call back  Angelena Form PA-C  [facility]

## 2020-01-19 ENCOUNTER — Encounter: Payer: Self-pay | Admitting: Internal Medicine

## 2020-01-19 DIAGNOSIS — R051 Acute cough: Secondary | ICD-10-CM | POA: Insufficient documentation

## 2020-01-19 DIAGNOSIS — R059 Cough, unspecified: Secondary | ICD-10-CM | POA: Insufficient documentation

## 2020-01-19 DIAGNOSIS — R05 Cough: Secondary | ICD-10-CM | POA: Insufficient documentation

## 2020-01-19 NOTE — Assessment & Plan Note (Signed)
Increased cough and congestion as outlined.  Also associated with loss of taste and smell with nasal congestion.  Symptoms appear to be c/w covid.  Went to be tested today.  Results pending.  No chest pain or sob.  Hold on prednisone.  nasacort nasal spray and robitussin DM as outlined.  Rest.  Fluids.  Follow.  Taking tylenol.  Feels some better today.  Follow closely.  Discussed monoclonal antibody infusion if covid positive.  Symptoms started 01/07/20. May be out of window by the time get results.  Follow.

## 2020-01-19 NOTE — Assessment & Plan Note (Signed)
Follow sugars when sick.  On trulicity and metformin.

## 2020-01-26 ENCOUNTER — Telehealth: Payer: Self-pay | Admitting: *Deleted

## 2020-01-26 NOTE — Telephone Encounter (Signed)
Patient calling to confirm that she could still receive the 2nd dose of the covid-19 vaccine on 01/28/20. Pt states she received the first dose on 01/02/20 and then found later found she was positive for covid. Pt states she was advise to quarantine for days from 3/321 when she initially go sick. Pt advised that according to the current CDC recommendations she could receive the 2nd dose as scheduled as long as the isolation/quarantine period was complete and if she was no longer having symptoms. Pt states that she is not having symptoms at this time. Pt also advised that retesting for covid was not recommended prior to 90 days from the original positive test. Pt verbalized understanding.

## 2020-01-28 ENCOUNTER — Ambulatory Visit: Payer: Medicare HMO | Attending: Internal Medicine

## 2020-01-28 DIAGNOSIS — Z23 Encounter for immunization: Secondary | ICD-10-CM

## 2020-01-28 NOTE — Progress Notes (Signed)
   Covid-19 Vaccination Clinic  Name:  Jody Wang    MRN: [id number] DOB: [birth year]  01/28/2020  [name] was observed post Covid-19 immunization for 15 minutes without incident. She was provided with Vaccine Information Sheet and instruction to access the V-Safe system.   [name] was instructed to call 911 with any severe reactions post vaccine: Marland Kitchen Difficulty breathing  . Swelling of face and throat  . A fast heartbeat  . A bad rash all over body  . Dizziness and weakness   Immunizations Administered    Name Date Dose VIS Date Route   Pfizer COVID-19 Vaccine 01/28/2020  4:04 PM 0.3 mL 10/17/2019 Intramuscular   Manufacturer: [place]   Lot: [id number]   [place]: [id number]

## 2020-02-03 ENCOUNTER — Telehealth: Payer: Self-pay | Admitting: Internal Medicine

## 2020-02-03 MED ORDER — PANTOPRAZOLE SODIUM 40 MG PO TBEC: DELAYED_RELEASE_TABLET | ORAL | 1 refills | Status: DC

## 2020-02-03 MED ORDER — FLUTICASONE PROPIONATE 50 MCG/ACT NA SUSP: 2.0000 | Freq: Every day | NASAL | 3 refills | Status: DC

## 2020-02-03 MED ORDER — ONETOUCH VERIO VI STRP: ORAL_STRIP | 3 refills | Status: DC

## 2020-02-03 MED ORDER — TRAZODONE HCL 50 MG PO TABS: 50.0000 mg | ORAL_TABLET | Freq: Every day | ORAL | 1 refills | Status: DC

## 2020-02-03 NOTE — Telephone Encounter (Signed)
Patient needs the following refills; fluticasone (FLONASE) 50 MCG/ACT nasal spray and glucose blood (ONETOUCH VERIO) test strip, traZODone (DESYREL) 50 MG tablet, pantoprazole (PROTONIX) 40 MG tablet. Patient has plenty of One Touch Ultrasoft, please do not order these. Please send to mail order pharmacy.

## 2020-02-09 ENCOUNTER — Telehealth: Payer: Self-pay | Admitting: Internal Medicine

## 2020-02-09 NOTE — Telephone Encounter (Signed)
Per review, I had ordered a f/u Chest CT on pt.  I do not see results.  Did she have the CT.  If no, needs to schedule f/u CT.  Let me know either way.

## 2020-02-10 ENCOUNTER — Encounter: Payer: Medicare HMO | Admitting: Internal Medicine

## 2020-02-10 ENCOUNTER — Telehealth: Payer: Self-pay | Admitting: *Deleted

## 2020-02-10 DIAGNOSIS — E7800 Pure hypercholesterolemia, unspecified: Secondary | ICD-10-CM

## 2020-02-10 DIAGNOSIS — E039 Hypothyroidism, unspecified: Secondary | ICD-10-CM

## 2020-02-10 DIAGNOSIS — E1165 Type 2 diabetes mellitus with hyperglycemia: Secondary | ICD-10-CM

## 2020-02-10 NOTE — Telephone Encounter (Signed)
Please place future orders for lab appt.  

## 2020-02-10 NOTE — Telephone Encounter (Signed)
LMTCB. When you speak with patient tomorrow, do you mind asking her if she had this done and let me know?

## 2020-02-10 NOTE — Telephone Encounter (Signed)
Will ask during awv, no problem.

## 2020-02-11 ENCOUNTER — Ambulatory Visit (INDEPENDENT_AMBULATORY_CARE_PROVIDER_SITE_OTHER): Payer: Medicare HMO

## 2020-02-11 VITALS — Ht 61.0 in | Wt 130.0 lb

## 2020-02-11 DIAGNOSIS — Z0000 Encounter for general adult medical examination without abnormal findings: Secondary | ICD-10-CM

## 2020-02-11 NOTE — Telephone Encounter (Signed)
Pt has been screened multiple times and has passed the 21 day mark to come into office. Still has some loss of taste but other than that she does not have any other symptoms

## 2020-02-11 NOTE — Progress Notes (Addendum)
Subjective:   Jody Wang is a 74 y.o. female who presents for Medicare Annual (Subsequent) preventive examination.  Review of Systems:  No ROS.  Medicare Wellness Virtual Visit.  Visual/audio telehealth visit, UTA vital signs.   See social history for additional risk factors.  Cardiac Risk Factors include: advanced age (>33mn, >>90women)     Objective:     Vitals: Ht 5' 1"  (1.549 m)   Wt 130 lb (59 kg)   BMI 24.56 kg/m   Body mass index is 24.56 kg/m.  Advanced Directives 02/11/2020 02/10/2019 02/08/2018 04/13/2017 11/30/2016 03/15/2016 02/16/2016  Does Patient Have a Medical Advance Directive? No No No Yes No No No  Type of Advance Directive - - - [place] Does patient want to make changes to medical advance directive? No - Patient declined - - - No - Patient declined - -  Would patient like information on creating a medical advance directive? - No - Patient declined No - Patient declined - No - Patient declined No - patient declined information No - patient declined information    Tobacco Social History   Tobacco Use  Smoking Status Former Smoker  . Packs/day: 1.00  . Years: 23.00  . Pack years: 23.00  . Quit date: 11/20/1983  . Years since quitting: 36.2  Smokeless Tobacco Never Used     Counseling given: Not Answered   Clinical Intake:  Pre-visit preparation completed: Yes        Diabetes: Yes(Followed by pcp)  How often do you need to have someone help you when you read instructions, pamphlets, or other written materials from your doctor or pharmacy?: 1 - Never  Interpreter Needed?: No     Past Medical History:  Diagnosis Date  . Depression   . Diabetes mellitus without complication ([place]   . GERD (gastroesophageal reflux disease)   . Hypercholesterolemia   . Hypothyroidism   . Osteoarthritis of knee   . Wears dentures    partial lower   Past Surgical History:  Procedure Laterality Date  . ABDOMINAL HYSTERECTOMY   1985   ovaries not removed  . BREAST CYST EXCISION Right   . BREAST CYST EXCISION Left   . CATARACT EXTRACTION W/PHACO Right 02/16/2016   Procedure: CATARACT EXTRACTION PHACO AND INTRAOCULAR LENS PLACEMENT ([place] RIGHT ;  Surgeon: CLeandrew Koyanagi MD;  [facility]  Service: Ophthalmology;  Laterality: Right;  DIABETIC - oral meds  . CATARACT EXTRACTION W/PHACO Left 03/15/2016   Procedure: CATARACT EXTRACTION PHACO AND INTRAOCULAR LENS PLACEMENT ([facility])left eye;  Surgeon: CLeandrew Koyanagi MD;  [facility]  Service: Ophthalmology;  Laterality: Left;  DIABETIC - oral meds  . INNER EAR SURGERY     Family History  Problem Relation Age of Onset  . Diabetes Father   . Coronary artery disease Father        s/p CABG  . Hypertension Father   . Hypercholesterolemia Father   . Hypertension Brother   . Diabetes Brother   . Hypertension Sister   . Glaucoma Sister   . Diabetes Other        niece  . Breast cancer Maternal Aunt   . Breast cancer Paternal Aunt   . Cervical cancer Paternal Aunt   . Breast cancer Cousin   . Breast cancer Other    Social History   Socioeconomic History  . Marital status: Widowed    Spouse name: Not on file  . Number  of children: 3  . Years of education: Not on file  . Highest education level: Not on file  Occupational History  . Not on file  Tobacco Use  . Smoking status: Former Smoker    Packs/day: 1.00    Years: 23.00    Pack years: 23.00    Quit date: 11/20/1983    Years since quitting: 36.2  . Smokeless tobacco: Never Used  Substance and Sexual Activity  . Alcohol use: Yes    Alcohol/week: 0.0 standard drinks    Comment: rare  . Drug use: No  . Sexual activity: Never  Other Topics Concern  . Not on file  Social History Narrative  . Not on file   Social Determinants of Health   Financial Resource Strain:   . Difficulty of Paying Living Expenses:   Food Insecurity:   . Worried About Sales executive in the Last Year:   . Arboriculturist in the Last Year:   Transportation Needs:   . Film/video editor (Medical):   Marland Kitchen Lack of Transportation (Non-Medical):   Physical Activity:   . Days of Exercise per Week:   . Minutes of Exercise per Session:   Stress:   . Feeling of Stress :   Social Connections:   . Frequency of Communication with Friends and Family:   . Frequency of Social Gatherings with Friends and Family:   . Attends Religious Services:   . Active Member of Clubs or Organizations:   . Attends Archivist Meetings:   Marland Kitchen Marital Status:     Outpatient Encounter Medications as of 02/11/2020  Medication Sig  . aspirin 81 MG chewable tablet Chew 81 mg by mouth daily.  . blood glucose meter kit and supplies KIT Dispense based on patient and insurance preference. Use up to four times daily as directed. (FOR ICD-9 250.00, 250.01).  . cholecalciferol (VITAMIN D) 1000 units tablet Take 1,000 Units by mouth daily.  . cyanocobalamin 1000 MCG tablet Take 1,000 mcg by mouth daily.  . diclofenac (VOLTAREN) 50 MG EC tablet TAKE 1 TABLET TWICE DAILY AS NEEDED  . Dulaglutide (TRULICITY) 5.32 DJ/2.4QA SOPN Inject 0.75 mg into the skin once a week.  Marland Kitchen FLUoxetine (PROZAC) 20 MG capsule TAKE 1 CAPSULE EVERY DAY  . fluticasone (FLONASE) 50 MCG/ACT nasal spray Place 2 sprays into both nostrils daily.  Marland Kitchen glucose blood (ONETOUCH VERIO) test strip TEST BLOOD SUGAR TWICE DAILY AS DIRECTED  . glucose blood test strip Use as instructed to check blood sugars to check blood sugars twice daily. Dx E11.9  . Lancets (ONETOUCH ULTRASOFT) lancets TEST BLOOD SUGAR TWICE DAILY  . levocetirizine (XYZAL) 5 MG tablet TAKE 1 TABLET EVERY DAY  . levothyroxine (SYNTHROID) 88 MCG tablet Take 1 tablet (88 mcg total) by mouth daily.  . metFORMIN (GLUCOPHAGE-XR) 500 MG 24 hr tablet TAKE 1 TABLET TWICE DAILY  . pantoprazole (PROTONIX) 40 MG tablet On tab po daily  . rosuvastatin (CRESTOR) 40 MG tablet Take  1 tablet (40 mg total) by mouth daily.  . traZODone (DESYREL) 50 MG tablet Take 1 tablet (50 mg total) by mouth daily.  Marland Kitchen glucose blood (ONETOUCH VERIO) test strip Use as instructed to check blood sugars twice daily. Dx: E 11.9   No facility-administered encounter medications on file as of 02/11/2020.    Activities of Daily Living In your present state of health, do you have any difficulty performing the following activities: 02/11/2020  Hearing? Y  Comment  L ear hearing aid  Vision? N  Difficulty concentrating or making decisions? N  Walking or climbing stairs? Y  Comment Chronic knee pain  Dressing or bathing? N  Doing errands, shopping? N  Preparing Food and eating ? N  Using the Toilet? N  In the past six months, have you accidently leaked urine? N  Do you have problems with loss of bowel control? N  Managing your Medications? N  Managing your Finances? N  Housekeeping or managing your Housekeeping? Y  Comment Maid assist once a month  Some recent data might be hidden    Patient Care Team: Einar Pheasant, MD as PCP - General (Internal Medicine) Minna Merritts, MD as Consulting Physician (Cardiology)    Assessment:   This is a routine wellness examination for [place].  Nurse connected with patient 02/11/20 at  1:00 PM EDT by a telephone enabled telemedicine application and verified that I am speaking with the correct person using two identifiers. Patient stated full name and DOB. Patient gave permission to continue with virtual visit. Patient's location was at home and Nurse's location was at [place] office.   Patient is alert and oriented x3. Patient denies difficulty focusing or concentrating. Patient likes to read and completes crossword puzzles for brain health.  Health Maintenance Due: -Tdap vaccine- discussed; to be completed with doctor in visit or local pharmacy.   -Foot Exam-denies wounds or changes. Foll -Hgb A1c- 04/18/20 (8.9) -Eye Exam- annual visits; plans  to schedule See completed HM at the end of note.   Eye: Visual acuity not assessed. Virtual visit. Followed by their ophthalmologist.   Dental: UTD  Hearing: Hearing aids- yes, L hearing aid  Safety:  Patient feels safe at home- yes Patient does have smoke detectors at home- yes Patient does wear sunscreen or protective clothing when in direct sunlight - yes Patient does wear seat belt when in a moving vehicle - yes Patient drives- yes Adequate lighting in walkways free from debris- yes Grab bars and handrails used as appropriate- yes Ambulates with an assistive device- no  Social: Alcohol intake - yes, rare    Smoking history- former  Smokers in home? none Illicit drug use? none  Medication: Taking as directed and without issues.  Pill box in use -yes  Son manages - yes   Covid-19: Precautions and sickness symptoms discussed. Wears mask, social distancing, hand hygiene as appropriate.   Activities of Daily Living Patient denies needing assistance with: feeding themselves, getting from bed to chair, getting to the toilet, bathing/showering, dressing, managing money, or preparing meals.  Maid assist once monthly household chores. Son lives with patient and assists with household chores as needed.  Discussed the importance of a healthy diet, water intake and the benefits of aerobic exercise.   Physical activity- walking dog to the mailbox daily, 5-6 minutes. Walks inside the home.   Diet:  Modified Water: 1 glass. Encouraged to drink more water.  Caffeine: 3-4 Coke Zero daily. 1 cup of coffee every 3 weeks.   Other Providers Patient Care Team: Einar Pheasant, MD as PCP - General (Internal Medicine) Minna Merritts, MD as Consulting Physician (Cardiology)  Exercise Activities and Dietary recommendations Current Exercise Habits: Home exercise routine, Type of exercise: walking, Intensity: Mild  Goals      Patient Stated   . DIET - INCREASE WATER INTAKE  (pt-stated)     Try to drink more water       Fall Risk Fall Risk  02/11/2020  02/10/2019 02/08/2018 11/30/2016 09/25/2016  Falls in the past year? 0 0 No No No  Number falls in past yr: - - - - -  Injury with Fall? - - - - -  Comment - - - - -  Follow up Falls evaluation completed - - - -    Timed Get Up and Go performed: no, virtual visit  Depression Screen PHQ 2/9 Scores 02/11/2020 02/10/2019 02/08/2018 11/30/2016  PHQ - 2 Score 0 0 0 1  PHQ- 9 Score - - - -     Cognitive Function MMSE - Mini Mental State Exam 11/30/2015  Orientation to time 5  Orientation to Place 5  Registration 3  Attention/ Calculation 5  Recall 3  Language- name 2 objects 2  Language- repeat 1  Language- follow 3 step command 3  Language- read & follow direction 1  Write a sentence 1  Copy design 1  Total score 30     6CIT Screen 02/11/2020 02/10/2019 02/08/2018 11/30/2016  What Year? 0 points 0 points 0 points 0 points  What month? 0 points 0 points 0 points 0 points  What time? 0 points 0 points 0 points 0 points  Count back from 20 0 points 0 points 0 points 0 points  Months in reverse 0 points 0 points 0 points 0 points  Repeat phrase 0 points 0 points 0 points 0 points  Total Score 0 0 0 0    Immunization History  Administered Date(s) Administered  . Influenza Split 08/24/2013, 08/20/2014  . Influenza, High Dose Seasonal PF 09/25/2016, 07/11/2018, 08/26/2019  . Influenza,inj,Quad PF,6+ Mos 07/09/2015  . Influenza-Unspecified 08/10/2017  . PFIZER SARS-COV-2 Vaccination 01/02/2020, 01/28/2020  . Pneumococcal Conjugate-13 10/08/2015  . Pneumococcal Polysaccharide-23 07/01/2013  . Zoster 08/24/2013   Screening Tests Health Maintenance  Topic Date Due  . TETANUS/TDAP  Never done  . OPHTHALMOLOGY EXAM  01/24/2017  . HEMOGLOBIN A1C  04/08/2019  . FOOT EXAM  04/10/2019  . URINE MICROALBUMIN  04/19/2019  . INFLUENZA VACCINE  06/06/2020  . MAMMOGRAM  10/05/2021  . Fecal DNA (Cologuard)  02/23/2022    . DEXA SCAN  Completed  . Hepatitis C Screening  Completed  . PNA vac Low Risk Adult  Completed       Plan:   Keep all routine maintenance appointments.   Next scheduled lab 02/12/20 @ 9:45  Follow up 02/17/20 @ 2:00   Nurse asked patient if she completed CT ordered in 2018. Patient states,"Idid it. I do everything my doctor tells me to do."  Medicare Attestation I have personally reviewed: The patient's medical and social history Their use of alcohol, tobacco or illicit drugs Their current medications and supplements The patient's functional ability including ADLs,fall risks, home safety risks, cognitive, and hearing and visual impairment Diet and physical activities Evidence for depression    I have reviewed and discussed with patient certain preventive protocols, quality metrics, and best practice recommendations.     Varney Biles, LPN  12/11/537   Reviewed above information.  Agree with assessment and plan.  Has seen Dr Genevive Bi.  CT has been ordered previously.  Will discuss with her regarding scheduling follow up CT scan.    Dr Nicki Reaper

## 2020-02-11 NOTE — Telephone Encounter (Signed)
Patient stated she has completed the Chest CT because she does everything her doctor asks.  T.Davis, LPN notes the CT was in 2018. Patient reports no recent CT.  LPN made aware via teams messaging about 1:30. Please schedule new CT as needed.

## 2020-02-11 NOTE — Telephone Encounter (Signed)
Thank you! Sending back to you for documentation so we can let Dr Nicki Reaper know. She wanted to know if she had the scan done. I think you see her today at 1:00.

## 2020-02-11 NOTE — Patient Instructions (Addendum)
  Jody Wang , Thank you for taking time to come for your Medicare Wellness Visit. I appreciate your ongoing commitment to your health goals. Please review the following plan we discussed and let me know if I can assist you in the future.   These are the goals we discussed: Goals      Patient Stated   . DIET - INCREASE WATER INTAKE (pt-stated)     Try to drink more water       This is a list of the screening recommended for you and due dates:  Health Maintenance  Topic Date Due  . Tetanus Vaccine  Never done  . Eye exam for diabetics  01/24/2017  . Hemoglobin A1C  04/08/2019  . Complete foot exam   04/10/2019  . Urine Protein Check  04/19/2019  . Flu Shot  06/06/2020  . Mammogram  10/05/2021  . Cologuard (Stool DNA test)  02/23/2022  . DEXA scan (bone density measurement)  Completed  .  Hepatitis C: One time screening is recommended by Center for Disease Control  (CDC) for  adults born from [birth year] through 1966.   Completed  . Pneumonia vaccines  Completed

## 2020-02-11 NOTE — Telephone Encounter (Signed)
Orders placed for labs.  Pt recently diagnosed with covid.  Check our screening protocol.  I am forwarding to [place].  Will have her contact pt.

## 2020-02-12 ENCOUNTER — Other Ambulatory Visit: Payer: Self-pay

## 2020-02-12 ENCOUNTER — Other Ambulatory Visit (INDEPENDENT_AMBULATORY_CARE_PROVIDER_SITE_OTHER): Payer: Medicare HMO

## 2020-02-12 DIAGNOSIS — E039 Hypothyroidism, unspecified: Secondary | ICD-10-CM

## 2020-02-12 DIAGNOSIS — E7800 Pure hypercholesterolemia, unspecified: Secondary | ICD-10-CM | POA: Diagnosis not present

## 2020-02-12 DIAGNOSIS — E1165 Type 2 diabetes mellitus with hyperglycemia: Secondary | ICD-10-CM

## 2020-02-12 LAB — BASIC METABOLIC PANEL
BUN: 20 mg/dL (ref 6–23)
CO2: 22 mEq/L (ref 19–32)
Calcium: 9.7 mg/dL (ref 8.4–10.5)
Chloride: 100 mEq/L (ref 96–112)
Creatinine, Ser: 0.86 mg/dL (ref 0.40–1.20)
GFR: 64.44 mL/min (ref >60.00)
Glucose, Bld: 129 mg/dL — ABNORMAL HIGH (ref 70–99)
Potassium: 4.1 mEq/L (ref 3.5–5.1)
Sodium: 133 mEq/L — ABNORMAL LOW (ref 135–145)

## 2020-02-12 LAB — MICROALBUMIN / CREATININE URINE RATIO
Creatinine,U: 32.6 mg/dL
Microalb Creat Ratio: 2.1 mg/g (ref 0.0–30.0)
Microalb, Ur: <0.7 mg/dL (ref 0.0–1.9)

## 2020-02-12 LAB — HEPATIC FUNCTION PANEL
ALT: 9 U/L (ref 0–35)
AST: 15 U/L (ref 0–37)
Albumin: 4.6 g/dL (ref 3.5–5.2)
Alkaline Phosphatase: 77 U/L (ref 39–117)
Bilirubin, Direct: 0.1 mg/dL (ref 0.0–0.3)
Total Bilirubin: 0.3 mg/dL (ref 0.2–1.2)
Total Protein: 7.8 g/dL (ref 6.0–8.3)

## 2020-02-12 LAB — CBC WITH DIFFERENTIAL/PLATELET
Basophils Absolute: 0.1 10*3/uL (ref 0.0–0.1)
Basophils Relative: 0.5 % (ref 0.0–3.0)
Eosinophils Absolute: 0.4 10*3/uL (ref 0.0–0.7)
Eosinophils Relative: 3.1 % (ref 0.0–5.0)
HCT: 35.7 % — ABNORMAL LOW (ref 36.0–46.0)
Hemoglobin: 11.5 g/dL — ABNORMAL LOW (ref 12.0–15.0)
Lymphocytes Relative: 26.8 % (ref 12.0–46.0)
Lymphs Abs: 3.3 10*3/uL (ref 0.7–4.0)
MCHC: 32.2 g/dL (ref 30.0–36.0)
MCV: 79.5 fl (ref 78.0–100.0)
Monocytes Absolute: 0.9 10*3/uL (ref 0.1–1.0)
Monocytes Relative: 7.1 % (ref 3.0–12.0)
Neutro Abs: 7.6 10*3/uL (ref 1.4–7.7)
Neutrophils Relative %: 62.5 % (ref 43.0–77.0)
Platelets: 382 10*3/uL (ref 150.0–400.0)
RBC: 4.49 Mil/uL (ref 3.87–5.11)
RDW: 16 % — ABNORMAL HIGH (ref 11.5–15.5)
WBC: 12.2 10*3/uL — ABNORMAL HIGH (ref 4.0–10.5)

## 2020-02-12 LAB — LIPID PANEL
Cholesterol: 129 mg/dL (ref 0–200)
HDL: 45.1 mg/dL (ref >39.00)
LDL Cholesterol: 51 mg/dL (ref 0–99)
NonHDL: 84.08
Total CHOL/HDL Ratio: 3
Triglycerides: 167 mg/dL — ABNORMAL HIGH (ref 0.0–149.0)
VLDL: 33.4 mg/dL (ref 0.0–40.0)

## 2020-02-12 LAB — HEMOGLOBIN A1C: Hgb A1c MFr Bld: 7.4 % — ABNORMAL HIGH (ref 4.6–6.5)

## 2020-02-12 LAB — TSH: TSH: 3.25 u[IU]/mL (ref 0.35–4.50)

## 2020-02-12 NOTE — Telephone Encounter (Signed)
Follow up CT scan is recommended.  We will need to schedule.  Would like to confirm over covid prior to scheduling.  Is she having any residual pulmonary symptoms.

## 2020-02-13 NOTE — Telephone Encounter (Signed)
Pt has been screened multiple times and came in this week for labs

## 2020-02-15 ENCOUNTER — Other Ambulatory Visit: Payer: Self-pay | Admitting: Internal Medicine

## 2020-02-17 ENCOUNTER — Ambulatory Visit (INDEPENDENT_AMBULATORY_CARE_PROVIDER_SITE_OTHER): Payer: Medicare HMO | Admitting: Internal Medicine

## 2020-02-17 ENCOUNTER — Other Ambulatory Visit: Payer: Self-pay

## 2020-02-17 DIAGNOSIS — F329 Major depressive disorder, single episode, unspecified: Secondary | ICD-10-CM | POA: Diagnosis not present

## 2020-02-17 DIAGNOSIS — R9389 Abnormal findings on diagnostic imaging of other specified body structures: Secondary | ICD-10-CM | POA: Diagnosis not present

## 2020-02-17 DIAGNOSIS — E1165 Type 2 diabetes mellitus with hyperglycemia: Secondary | ICD-10-CM | POA: Diagnosis not present

## 2020-02-17 DIAGNOSIS — D473 Essential (hemorrhagic) thrombocythemia: Secondary | ICD-10-CM

## 2020-02-17 DIAGNOSIS — K219 Gastro-esophageal reflux disease without esophagitis: Secondary | ICD-10-CM | POA: Diagnosis not present

## 2020-02-17 DIAGNOSIS — F32A Depression, unspecified: Secondary | ICD-10-CM

## 2020-02-17 DIAGNOSIS — I25118 Atherosclerotic heart disease of native coronary artery with other forms of angina pectoris: Secondary | ICD-10-CM

## 2020-02-17 DIAGNOSIS — I700 Atherosclerosis of aorta: Secondary | ICD-10-CM | POA: Diagnosis not present

## 2020-02-17 DIAGNOSIS — E7800 Pure hypercholesterolemia, unspecified: Secondary | ICD-10-CM

## 2020-02-17 DIAGNOSIS — E039 Hypothyroidism, unspecified: Secondary | ICD-10-CM

## 2020-02-17 DIAGNOSIS — D649 Anemia, unspecified: Secondary | ICD-10-CM

## 2020-02-17 DIAGNOSIS — D75839 Thrombocytosis, unspecified: Secondary | ICD-10-CM

## 2020-02-17 NOTE — Progress Notes (Signed)
Patient ID: Jody Wang, female   DOB: December 27, 1945, 74 y.o.   MRN: [id number]   Subjective:    Patient ID: Jody Wang, female    DOB: 1946-06-21, 74 y.o.   MRN: [id number]  HPI This visit occurred during the SARS-CoV-2 public health emergency.  Safety protocols were in place, including screening questions prior to the visit, additional usage of staff PPE, and extensive cleaning of exam room while observing appropriate contact time as indicated for disinfecting solutions.  Patient here for a scheduled follow up.  Recent diagnosis of covid - tested positive 01/15/20.   Had increased cough and congestion.  Also lost her sense of taste and smell.   Uses nasacort nasal spray and robitussin.  Symptoms resolved.  She is feeling back to her baseline.  No fever.  No sinus congestion or drainage.  Eating and drinking.  No chest pain or sob.  No chest congestion.  No nausea or vomiting.  No abdominal pain.  She is overdue f/u chest CT.  Previously saw Dr Genevive Bi.  She declined to have right now.  Wants to wait until after end of May.  Will notify me when agreeable.  Sugars averaging 150-170s (mostly).  Discussed diet and exercise.    Past Medical History:  Diagnosis Date  . Depression   . Diabetes mellitus without complication ([place])   . GERD (gastroesophageal reflux disease)   . Hypercholesterolemia   . Hypothyroidism   . Osteoarthritis of knee   . Wears dentures    partial lower   Past Surgical History:  Procedure Laterality Date  . ABDOMINAL HYSTERECTOMY  1985   ovaries not removed  . BREAST CYST EXCISION Right   . BREAST CYST EXCISION Left   . CATARACT EXTRACTION W/PHACO Right 02/16/2016   Procedure: CATARACT EXTRACTION PHACO AND INTRAOCULAR LENS PLACEMENT ([place]) RIGHT ;  Surgeon: Leandrew Koyanagi, MD;  Location: [facility];  Service: Ophthalmology;  Laterality: Right;  DIABETIC - oral meds  . CATARACT EXTRACTION W/PHACO Left 03/15/2016   Procedure: CATARACT EXTRACTION PHACO  AND INTRAOCULAR LENS PLACEMENT ([facility])left eye;  Surgeon: Leandrew Koyanagi, MD;  Location: [facility];  Service: Ophthalmology;  Laterality: Left;  DIABETIC - oral meds  . INNER EAR SURGERY     Family History  Problem Relation Age of Onset  . Diabetes Father   . Coronary artery disease Father        s/p CABG  . Hypertension Father   . Hypercholesterolemia Father   . Hypertension Brother   . Diabetes Brother   . Hypertension Sister   . Glaucoma Sister   . Diabetes Other        niece  . Breast cancer Maternal Aunt   . Breast cancer Paternal Aunt   . Cervical cancer Paternal Aunt   . Breast cancer Cousin   . Breast cancer Other    Social History   Socioeconomic History  . Marital status: Widowed    Spouse name: Not on file  . Number of children: 3  . Years of education: Not on file  . Highest education level: Not on file  Occupational History  . Not on file  Tobacco Use  . Smoking status: Former Smoker    Packs/day: 1.00    Years: 23.00    Pack years: 23.00    Quit date: 11/20/1983    Years since quitting: 36.2  . Smokeless tobacco: Never Used  Substance and Sexual Activity  . Alcohol use: Yes    Alcohol/week:  0.0 standard drinks    Comment: rare  . Drug use: No  . Sexual activity: Never  Other Topics Concern  . Not on file  Social History Narrative  . Not on file   Social Determinants of Health   Financial Resource Strain:   . Difficulty of Paying Living Expenses:   Food Insecurity:   . Worried About Charity fundraiser in the Last Year:   . Arboriculturist in the Last Year:   Transportation Needs:   . Film/video editor (Medical):   Marland Kitchen Lack of Transportation (Non-Medical):   Physical Activity:   . Days of Exercise per Week:   . Minutes of Exercise per Session:   Stress:   . Feeling of Stress :   Social Connections:   . Frequency of Communication with Friends and Family:   . Frequency of Social Gatherings with Friends and Family:   .  Attends Religious Services:   . Active Member of Clubs or Organizations:   . Attends Archivist Meetings:   Marland Kitchen Marital Status:     Outpatient Encounter Medications as of 02/17/2020  Medication Sig  . aspirin 81 MG chewable tablet Chew 81 mg by mouth daily.  . blood glucose meter kit and supplies KIT Dispense based on patient and insurance preference. Use up to four times daily as directed. (FOR ICD-9 250.00, 250.01).  . cholecalciferol (VITAMIN D) 1000 units tablet Take 1,000 Units by mouth daily.  . cyanocobalamin 1000 MCG tablet Take 1,000 mcg by mouth daily.  . diclofenac (VOLTAREN) 50 MG EC tablet TAKE 1 TABLET TWICE DAILY AS NEEDED  . Dulaglutide (TRULICITY) 5.18 AC/1.6SA SOPN Inject 0.75 mg into the skin once a week.  Marland Kitchen FLUoxetine (PROZAC) 20 MG capsule TAKE 1 CAPSULE EVERY DAY  . fluticasone (FLONASE) 50 MCG/ACT nasal spray Place 2 sprays into both nostrils daily.  Marland Kitchen glucose blood (ONETOUCH VERIO) test strip TEST BLOOD SUGAR TWICE DAILY AS DIRECTED  . glucose blood test strip Use as instructed to check blood sugars to check blood sugars twice daily. Dx E11.9  . Lancets (ONETOUCH ULTRASOFT) lancets TEST BLOOD SUGAR TWICE DAILY  . levocetirizine (XYZAL) 5 MG tablet TAKE 1 TABLET EVERY DAY  . levothyroxine (SYNTHROID) 88 MCG tablet Take 1 tablet (88 mcg total) by mouth daily.  . metFORMIN (GLUCOPHAGE-XR) 500 MG 24 hr tablet TAKE 1 TABLET TWICE DAILY  . rosuvastatin (CRESTOR) 40 MG tablet Take 1 tablet (40 mg total) by mouth daily.  . traZODone (DESYREL) 50 MG tablet Take 1 tablet (50 mg total) by mouth daily.  . [DISCONTINUED] ONETOUCH VERIO test strip USE AS INSTRUCTED TO CHECK BLOOD SUGARS TWICE DAILY.   . [DISCONTINUED] pantoprazole (PROTONIX) 40 MG tablet On tab po daily   No facility-administered encounter medications on file as of 02/17/2020.   Review of Systems  Constitutional: Negative for appetite change and unexpected weight change.  HENT: Negative for congestion  and sinus pressure.   Respiratory: Negative for cough, chest tightness and shortness of breath.   Cardiovascular: Negative for chest pain, palpitations and leg swelling.  Gastrointestinal: Negative for abdominal pain, diarrhea, nausea and vomiting.  Genitourinary: Negative for difficulty urinating and dysuria.  Musculoskeletal: Negative for joint swelling and myalgias.  Skin: Negative for color change and rash.  Neurological: Negative for dizziness, light-headedness and headaches.  Psychiatric/Behavioral: Negative for agitation and dysphoric mood.       Objective:    Physical Exam Vitals reviewed.  Constitutional:  General: She is not in acute distress.    Appearance: Normal appearance.  HENT:     Head: Normocephalic and atraumatic.     Right Ear: External ear normal.     Left Ear: External ear normal.  Eyes:     General: No scleral icterus.       Right eye: No discharge.        Left eye: No discharge.     Conjunctiva/sclera: Conjunctivae normal.  Neck:     Thyroid: No thyromegaly.  Cardiovascular:     Rate and Rhythm: Normal rate and regular rhythm.  Pulmonary:     Effort: No respiratory distress.     Breath sounds: Normal breath sounds. No wheezing.  Abdominal:     General: Bowel sounds are normal.     Palpations: Abdomen is soft.     Tenderness: There is no abdominal tenderness.  Musculoskeletal:        General: No swelling or tenderness.     Cervical back: Neck supple. No tenderness.  Lymphadenopathy:     Cervical: No cervical adenopathy.  Skin:    Findings: No erythema or rash.  Neurological:     Mental Status: She is alert.  Psychiatric:        Mood and Affect: Mood normal.        Behavior: Behavior normal.     BP 124/76   Pulse 76   Temp (!) 97.5 F (36.4 C)   Resp 16   Ht 5' 1"  (1.549 m)   Wt 136 lb (61.7 kg)   SpO2 99%   BMI 25.70 kg/m  Wt Readings from Last 3 Encounters:  02/17/20 136 lb (61.7 kg)  02/11/20 130 lb (59 kg)  01/15/20 141  lb (64 kg)     Lab Results  Component Value Date   WBC 12.2 (H) 02/12/2020   HGB 11.5 (L) 02/12/2020   HCT 35.7 (L) 02/12/2020   PLT 382.0 02/12/2020   GLUCOSE 129 (H) 02/12/2020   CHOL 129 02/12/2020   TRIG 167.0 (H) 02/12/2020   HDL 45.10 02/12/2020   LDLDIRECT 188.8 06/30/2013   LDLCALC 51 02/12/2020   ALT 9 02/12/2020   AST 15 02/12/2020   NA 133 (L) 02/12/2020   K 4.1 02/12/2020   CL 100 02/12/2020   CREATININE 0.86 02/12/2020   BUN 20 02/12/2020   CO2 22 02/12/2020   TSH 3.25 02/12/2020   HGBA1C 7.4 (H) 02/12/2020   MICROALBUR <0.7 02/12/2020    MM 3D SCREEN BREAST BILATERAL  Result Date: 10/06/2019 CLINICAL DATA:  Screening. EXAM: DIGITAL SCREENING BILATERAL MAMMOGRAM WITH TOMO AND CAD COMPARISON:  Previous exam(s). ACR Breast Density Category c: The breast tissue is heterogeneously dense, which may obscure small masses. FINDINGS: There are no findings suspicious for malignancy. Images were processed with CAD. IMPRESSION: No mammographic evidence of malignancy. A result letter of this screening mammogram will be mailed directly to the patient. RECOMMENDATION: Screening mammogram in one year. (Code:SM-B-01Y) BI-RADS CATEGORY  1: Negative. Electronically Signed   By: Lillia Mountain M.D.   On: 10/06/2019 16:41       Assessment & Plan:   Problem List Items Addressed This Visit    Abnormal CT of the chest    Previously saw Dr Genevive Bi.  Overdue f/u. Has been ordered two previous times.  Discussed again with her today.  She declines to have CT right now.  States will plan for f/u CT chest after end of May.  Will notify me when  agreeable.        Anemia    Noticed on last check.  Recheck cbc with next labs.  Also check iron studies and B12 level.        Relevant Orders   CBC with Differential/Platelet   Vitamin B12   IBC + Ferritin   Aortic atherosclerosis ([place])    On crestor.       Coronary artery disease of native artery of native heart with stable angina pectoris  [facility])    Previously seen on chest CT.  Saw Dr Rockey Situ.  No active symptoms.  Continue risk factor modification.  Treat diabetes.  Continue crestor.        Depression    Stable.  On prozac.        GERD (gastroesophageal reflux disease)    Controlled on protonix.        Hypercholesterolemia    On crestor.  Low cholesterol diet and exercise.  Follow lipid panel and liver function tests.        Relevant Orders   Hepatic function panel   Lipid panel   Hypothyroidism    On thyroid replacement.  Follow tsh.       Thrombocytosis ([facility])    F/u platelet count 02/12/20 - wnl.       Type 2 diabetes mellitus with hyperglycemia ([facility])    Sugars as outlined.  Low carb diet and exercise.  On trulicity and metformin.  Hold on making changes.  Get her back in her routine.  Diet and exercise.  Follow met b and a1c.   Lab Results  Component Value Date   HGBA1C 7.4 (H) 02/12/2020        Relevant Orders   Hemoglobin T7R   Basic metabolic panel       Einar Pheasant, MD

## 2020-02-20 ENCOUNTER — Other Ambulatory Visit: Payer: Self-pay | Admitting: Internal Medicine

## 2020-02-20 MED ORDER — PANTOPRAZOLE SODIUM 40 MG PO TBEC: DELAYED_RELEASE_TABLET | ORAL | 1 refills | Status: DC

## 2020-02-20 NOTE — Telephone Encounter (Signed)
Pt needs a refill on pantoprazole (PROTONIX) 40 MG tablet sent to [place]

## 2020-02-20 NOTE — Telephone Encounter (Signed)
Refill sent.

## 2020-02-22 ENCOUNTER — Encounter: Payer: Self-pay | Admitting: Internal Medicine

## 2020-02-22 DIAGNOSIS — D649 Anemia, unspecified: Secondary | ICD-10-CM | POA: Insufficient documentation

## 2020-02-22 NOTE — Assessment & Plan Note (Signed)
Stable.  On prozac.   

## 2020-02-22 NOTE — Assessment & Plan Note (Signed)
Previously saw Dr Genevive Bi.  Overdue f/u. Has been ordered two previous times.  Discussed again with her today.  She declines to have CT right now.  States will plan for f/u CT chest after end of May.  Will notify me when agreeable.

## 2020-02-22 NOTE — Assessment & Plan Note (Signed)
On crestor.  Low cholesterol diet and exercise.  Follow lipid panel and liver function tests.   

## 2020-02-22 NOTE — Assessment & Plan Note (Signed)
On thyroid replacement.  Follow tsh.  

## 2020-02-22 NOTE — Assessment & Plan Note (Signed)
On [facility].   

## 2020-02-22 NOTE — Assessment & Plan Note (Signed)
Noticed on last check.  Recheck cbc with next labs.  Also check iron studies and B12 level.

## 2020-02-22 NOTE — Assessment & Plan Note (Signed)
Controlled on protonix.   

## 2020-02-22 NOTE — Assessment & Plan Note (Signed)
F/u platelet count 02/12/20 - wnl.

## 2020-02-22 NOTE — Assessment & Plan Note (Signed)
Sugars as outlined.  Low carb diet and exercise.  On trulicity and metformin.  Hold on making changes.  Get her back in her routine.  Diet and exercise.  Follow met b and a1c.   Lab Results  Component Value Date   HGBA1C 7.4 (H) 02/12/2020

## 2020-02-22 NOTE — Assessment & Plan Note (Signed)
Previously seen on chest CT.  Saw Dr Rockey Situ.  No active symptoms.  Continue risk factor modification.  Treat diabetes.  Continue crestor.

## 2020-02-24 ENCOUNTER — Other Ambulatory Visit: Payer: Self-pay | Admitting: Internal Medicine

## 2020-02-24 ENCOUNTER — Other Ambulatory Visit: Payer: Medicare HMO

## 2020-02-24 DIAGNOSIS — E1159 Type 2 diabetes mellitus with other circulatory complications: Secondary | ICD-10-CM

## 2020-02-24 NOTE — Telephone Encounter (Signed)
Medication Refill - Medication: AccuCheck glucose kit, strips, and lancets   Has the patient contacted their pharmacy? Yes.   (Agent: If no, request that the patient contact the pharmacy for the refill.) (Agent: If yes, when and what did the pharmacy advise?)  Preferred Pharmacy (with phone number or street name):  [place], [place]  [place] [address]  Phone: [phone] Fax: [phone]     Agent: Please be advised that RX refills may take up to 3 business days. We ask that you follow-up with your pharmacy.

## 2020-02-24 NOTE — Telephone Encounter (Signed)
Request for Rx- sent for review

## 2020-02-26 ENCOUNTER — Other Ambulatory Visit: Payer: Self-pay

## 2020-02-26 MED ORDER — BLOOD GLUCOSE MONITOR KIT: PACK | 0 refills | Status: DC

## 2020-02-26 MED ORDER — ONETOUCH ULTRASOFT LANCETS MISC: 5 refills | Status: DC

## 2020-02-26 MED ORDER — GLUCOSE BLOOD VI STRP: ORAL_STRIP | 12 refills | Status: DC

## 2020-02-26 MED ORDER — ONETOUCH VERIO W/DEVICE KIT: 1.0000 | PACK | Freq: Every day | 0 refills | Status: DC

## 2020-03-03 ENCOUNTER — Telehealth: Payer: Self-pay | Admitting: Internal Medicine

## 2020-03-03 MED ORDER — LEVOTHYROXINE SODIUM 88 MCG PO TABS: 88.0000 ug | ORAL_TABLET | Freq: Every day | ORAL | 3 refills | Status: DC

## 2020-03-03 MED ORDER — TRAZODONE HCL 50 MG PO TABS: 50.0000 mg | ORAL_TABLET | Freq: Every day | ORAL | 1 refills | Status: DC

## 2020-03-03 NOTE — Addendum Note (Signed)
Addended byElpidio Galea T on: 03/03/2020 04:18 PM   Modules accepted: Orders

## 2020-03-03 NOTE — Telephone Encounter (Signed)
Pt needs the following med refilledtraZODone (DESYREL) 50 MG tablet, levothyroxine (SYNTHROID) 88 MCG tablet.  Pt will be receiving a new glucose mont. From [facility], pt does not know what meter they are sending her.

## 2020-03-15 ENCOUNTER — Other Ambulatory Visit: Payer: Self-pay | Admitting: Internal Medicine

## 2020-03-16 NOTE — Telephone Encounter (Signed)
Patient called to let office know that her insurance, [employer] was going to fax to office which monitor is cover by insurance.

## 2020-03-17 NOTE — Telephone Encounter (Signed)
Awaiting fax from [facility]

## 2020-03-18 NOTE — Telephone Encounter (Signed)
Fax from [facility] placed in quick sign. Has been completed.

## 2020-03-18 NOTE — Telephone Encounter (Signed)
Signed and placed in box.   

## 2020-03-19 NOTE — Telephone Encounter (Signed)
Faxed to [employer]

## 2020-03-28 ENCOUNTER — Other Ambulatory Visit: Payer: Self-pay | Admitting: Internal Medicine

## 2020-03-30 DIAGNOSIS — H90A32 Mixed conductive and sensorineural hearing loss, unilateral, left ear with restricted hearing on the contralateral side: Secondary | ICD-10-CM | POA: Diagnosis not present

## 2020-03-30 DIAGNOSIS — H6123 Impacted cerumen, bilateral: Secondary | ICD-10-CM | POA: Diagnosis not present

## 2020-04-11 ENCOUNTER — Other Ambulatory Visit: Payer: Self-pay | Admitting: Internal Medicine

## 2020-04-17 ENCOUNTER — Other Ambulatory Visit: Payer: Self-pay | Admitting: Internal Medicine

## 2020-04-19 ENCOUNTER — Other Ambulatory Visit: Payer: Self-pay | Admitting: Internal Medicine

## 2020-05-05 ENCOUNTER — Telehealth: Payer: Self-pay | Admitting: Internal Medicine

## 2020-05-05 NOTE — Telephone Encounter (Signed)
Pt would like to know last A1C

## 2020-05-06 ENCOUNTER — Telehealth: Payer: Self-pay | Admitting: Cardiovascular Disease

## 2020-05-06 NOTE — Telephone Encounter (Signed)
Left detailed message for patient.

## 2020-05-06 NOTE — Telephone Encounter (Signed)
Patient states she was released from cardiology and doesn't need recall.   Deleting recall .

## 2020-05-27 ENCOUNTER — Other Ambulatory Visit (INDEPENDENT_AMBULATORY_CARE_PROVIDER_SITE_OTHER): Payer: Medicare Other

## 2020-05-27 ENCOUNTER — Other Ambulatory Visit: Payer: Self-pay

## 2020-05-27 DIAGNOSIS — E1165 Type 2 diabetes mellitus with hyperglycemia: Secondary | ICD-10-CM | POA: Diagnosis not present

## 2020-05-27 DIAGNOSIS — D649 Anemia, unspecified: Secondary | ICD-10-CM | POA: Diagnosis not present

## 2020-05-27 DIAGNOSIS — E7800 Pure hypercholesterolemia, unspecified: Secondary | ICD-10-CM | POA: Diagnosis not present

## 2020-05-27 LAB — LIPID PANEL
Cholesterol: 172 mg/dL (ref 0–200)
HDL: 47.4 mg/dL (ref >39.00)
LDL Cholesterol: 94 mg/dL (ref 0–99)
NonHDL: 124.14
Total CHOL/HDL Ratio: 4
Triglycerides: 149 mg/dL (ref 0.0–149.0)
VLDL: 29.8 mg/dL (ref 0.0–40.0)

## 2020-05-27 LAB — IBC + FERRITIN
Ferritin: 5.2 ng/mL — ABNORMAL LOW (ref 10.0–291.0)
Iron: 32 ug/dL — ABNORMAL LOW (ref 42–145)
Saturation Ratios: 6.8 % — ABNORMAL LOW (ref 20.0–50.0)
Transferrin: 337 mg/dL (ref 212.0–360.0)

## 2020-05-27 LAB — HEPATIC FUNCTION PANEL
ALT: 11 U/L (ref 0–35)
AST: 12 U/L (ref 0–37)
Albumin: 4.4 g/dL (ref 3.5–5.2)
Alkaline Phosphatase: 70 U/L (ref 39–117)
Bilirubin, Direct: 0.1 mg/dL (ref 0.0–0.3)
Total Bilirubin: 0.3 mg/dL (ref 0.2–1.2)
Total Protein: 7.3 g/dL (ref 6.0–8.3)

## 2020-05-27 LAB — CBC WITH DIFFERENTIAL/PLATELET
Basophils Absolute: 0 10*3/uL (ref 0.0–0.1)
Basophils Relative: 0.5 % (ref 0.0–3.0)
Eosinophils Absolute: 0.3 10*3/uL (ref 0.0–0.7)
Eosinophils Relative: 5.8 % — ABNORMAL HIGH (ref 0.0–5.0)
HCT: 35.9 % — ABNORMAL LOW (ref 36.0–46.0)
Hemoglobin: 11.6 g/dL — ABNORMAL LOW (ref 12.0–15.0)
Lymphocytes Relative: 26.8 % (ref 12.0–46.0)
Lymphs Abs: 1.6 10*3/uL (ref 0.7–4.0)
MCHC: 32.4 g/dL (ref 30.0–36.0)
MCV: 80.8 fl (ref 78.0–100.0)
Monocytes Absolute: 0.5 10*3/uL (ref 0.1–1.0)
Monocytes Relative: 9.3 % (ref 3.0–12.0)
Neutro Abs: 3.4 10*3/uL (ref 1.4–7.7)
Neutrophils Relative %: 57.6 % (ref 43.0–77.0)
Platelets: 393 10*3/uL (ref 150.0–400.0)
RBC: 4.45 Mil/uL (ref 3.87–5.11)
RDW: 16.5 % — ABNORMAL HIGH (ref 11.5–15.5)
WBC: 5.8 10*3/uL (ref 4.0–10.5)

## 2020-05-27 LAB — BASIC METABOLIC PANEL
BUN: 13 mg/dL (ref 6–23)
CO2: 24 mEq/L (ref 19–32)
Calcium: 9.3 mg/dL (ref 8.4–10.5)
Chloride: 105 mEq/L (ref 96–112)
Creatinine, Ser: 0.86 mg/dL (ref 0.40–1.20)
GFR: 64.39 mL/min (ref >60.00)
Glucose, Bld: 193 mg/dL — ABNORMAL HIGH (ref 70–99)
Potassium: 4.3 mEq/L (ref 3.5–5.1)
Sodium: 139 mEq/L (ref 135–145)

## 2020-05-27 LAB — HEMOGLOBIN A1C: Hgb A1c MFr Bld: 7.8 % — ABNORMAL HIGH (ref 4.6–6.5)

## 2020-05-27 LAB — VITAMIN B12: Vitamin B-12: 340 pg/mL (ref 211–911)

## 2020-06-01 ENCOUNTER — Encounter: Payer: Self-pay | Admitting: Internal Medicine

## 2020-06-01 ENCOUNTER — Other Ambulatory Visit: Payer: Self-pay

## 2020-06-01 ENCOUNTER — Ambulatory Visit (INDEPENDENT_AMBULATORY_CARE_PROVIDER_SITE_OTHER): Payer: Medicare Other | Admitting: Internal Medicine

## 2020-06-01 VITALS — BP 118/70 | HR 84 | Temp 98.1°F | Resp 16 | Ht 61.0 in | Wt 141.2 lb

## 2020-06-01 DIAGNOSIS — D75839 Thrombocytosis, unspecified: Secondary | ICD-10-CM

## 2020-06-01 DIAGNOSIS — Z0000 Encounter for general adult medical examination without abnormal findings: Secondary | ICD-10-CM | POA: Diagnosis not present

## 2020-06-01 DIAGNOSIS — K219 Gastro-esophageal reflux disease without esophagitis: Secondary | ICD-10-CM

## 2020-06-01 DIAGNOSIS — E1165 Type 2 diabetes mellitus with hyperglycemia: Secondary | ICD-10-CM

## 2020-06-01 DIAGNOSIS — I700 Atherosclerosis of aorta: Secondary | ICD-10-CM

## 2020-06-01 DIAGNOSIS — D473 Essential (hemorrhagic) thrombocythemia: Secondary | ICD-10-CM

## 2020-06-01 DIAGNOSIS — E039 Hypothyroidism, unspecified: Secondary | ICD-10-CM

## 2020-06-01 DIAGNOSIS — R0989 Other specified symptoms and signs involving the circulatory and respiratory systems: Secondary | ICD-10-CM

## 2020-06-01 DIAGNOSIS — I6523 Occlusion and stenosis of bilateral carotid arteries: Secondary | ICD-10-CM

## 2020-06-01 DIAGNOSIS — E7800 Pure hypercholesterolemia, unspecified: Secondary | ICD-10-CM

## 2020-06-01 DIAGNOSIS — D649 Anemia, unspecified: Secondary | ICD-10-CM

## 2020-06-01 DIAGNOSIS — Z1211 Encounter for screening for malignant neoplasm of colon: Secondary | ICD-10-CM

## 2020-06-01 NOTE — Progress Notes (Signed)
Patient ID: Jody Wang, female   DOB: April 30, 1946, 74 y.o.   MRN: [id number]   Subjective:    Patient ID: Jody Wang, female    DOB: [birth year], 74 y.o.   MRN: [id number]  HPI This visit occurred during the SARS-CoV-2 public health emergency.  Safety protocols were in place, including screening questions prior to the visit, additional usage of staff PPE, and extensive cleaning of exam room while observing appropriate contact time as indicated for disinfecting solutions.  Patient here for her physical exam. She reports she is doing relatively well.  Had covid.  Breathing back to normal.  Still with some decreased taste. Eating.  No chest pain.  No acid reflux reported.  No abdominal pain.  Bowels moving.  Not checking sugars.  Needs rx for test strips.  Discussed labs.   Past Medical History:  Diagnosis Date  . Depression   . Diabetes mellitus without complication ([place])   . GERD (gastroesophageal reflux disease)   . Hypercholesterolemia   . Hypothyroidism   . Osteoarthritis of knee   . Wears dentures    partial lower   Past Surgical History:  Procedure Laterality Date  . ABDOMINAL HYSTERECTOMY  1985   ovaries not removed  . BREAST CYST EXCISION Right   . BREAST CYST EXCISION Left   . CATARACT EXTRACTION W/PHACO Right 02/16/2016   Procedure: CATARACT EXTRACTION PHACO AND INTRAOCULAR LENS PLACEMENT ([place]) RIGHT ;  Surgeon: Leandrew Koyanagi, MD;  Location: [place];  Service: Ophthalmology;  Laterality: Right;  DIABETIC - oral meds  . CATARACT EXTRACTION W/PHACO Left 03/15/2016   Procedure: CATARACT EXTRACTION PHACO AND INTRAOCULAR LENS PLACEMENT ([facility])left eye;  Surgeon: Leandrew Koyanagi, MD;  Location: [place];  Service: Ophthalmology;  Laterality: Left;  DIABETIC - oral meds  . INNER EAR SURGERY     Family History  Problem Relation Age of Onset  . Diabetes Father   . Coronary artery disease Father        s/p CABG  . Hypertension Father   .  Hypercholesterolemia Father   . Hypertension Brother   . Diabetes Brother   . Hypertension Sister   . Glaucoma Sister   . Diabetes Other        niece  . Breast cancer Maternal Aunt   . Breast cancer Paternal Aunt   . Cervical cancer Paternal Aunt   . Breast cancer Cousin   . Breast cancer Other    Social History   Socioeconomic History  . Marital status: Widowed    Spouse name: Not on file  . Number of children: 3  . Years of education: Not on file  . Highest education level: Not on file  Occupational History  . Not on file  Tobacco Use  . Smoking status: Former Smoker    Packs/day: 1.00    Years: 23.00    Pack years: 23.00    Quit date: 11/20/1983    Years since quitting: 36.5  . Smokeless tobacco: Never Used  Vaping Use  . Vaping Use: Never used  Substance and Sexual Activity  . Alcohol use: Yes    Alcohol/week: 0.0 standard drinks    Comment: rare  . Drug use: No  . Sexual activity: Never  Other Topics Concern  . Not on file  Social History Narrative  . Not on file   Social Determinants of Health   Financial Resource Strain:   . Difficulty of Paying Living Expenses:   Food Insecurity:   .  Worried About Charity fundraiser in the Last Year:   . Arboriculturist in the Last Year:   Transportation Needs:   . Film/video editor (Medical):   Marland Kitchen Lack of Transportation (Non-Medical):   Physical Activity:   . Days of Exercise per Week:   . Minutes of Exercise per Session:   Stress:   . Feeling of Stress :   Social Connections:   . Frequency of Communication with Friends and Family:   . Frequency of Social Gatherings with Friends and Family:   . Attends Religious Services:   . Active Member of Clubs or Organizations:   . Attends Archivist Meetings:   Marland Kitchen Marital Status:     Outpatient Encounter Medications as of 06/01/2020  Medication Sig  . aspirin 81 MG chewable tablet Chew 81 mg by mouth daily.  . blood glucose meter kit and supplies KIT  Dispense based on patient and insurance preference. Use up to four times daily as directed. (FOR ICD-9 250.00, 250.01).  . Blood Glucose Monitoring Suppl (ONETOUCH VERIO) w/Device KIT 1 Device by Does not apply route daily.  . cholecalciferol (VITAMIN D) 1000 units tablet Take 1,000 Units by mouth daily.  . cyanocobalamin 1000 MCG tablet Take 1,000 mcg by mouth daily.  . diclofenac (VOLTAREN) 50 MG EC tablet TAKE 1 TABLET TWICE DAILY AS NEEDED  . Dulaglutide (TRULICITY) 4.19 QQ/2.2LN SOPN Inject 0.75 mg into the skin once a week.  Marland Kitchen FLUoxetine (PROZAC) 20 MG capsule TAKE 1 CAPSULE EVERY DAY  . fluticasone (FLONASE) 50 MCG/ACT nasal spray PLACE 2 SPRAYS INTO BOTH NOSTRILS DAILY.  Marland Kitchen glucose blood (ONETOUCH VERIO) test strip TEST BLOOD SUGAR TWICE DAILY AS DIRECTED  . glucose blood test strip Use as instructed to check blood sugars to check blood sugars twice daily. Dx E11.9  . Lancets (ONETOUCH ULTRASOFT) lancets TEST BLOOD SUGAR TWICE DAILY  . levocetirizine (XYZAL) 5 MG tablet TAKE 1 TABLET EVERY DAY  . levothyroxine (SYNTHROID) 88 MCG tablet TAKE 1 TABLET EVERY DAY  . metFORMIN (GLUCOPHAGE-XR) 500 MG 24 hr tablet TAKE 1 TABLET TWICE DAILY  . ONETOUCH VERIO test strip USE AS INSTRUCTED TO CHECK BLOOD SUGARS TWICE DAILY.   . pantoprazole (PROTONIX) 40 MG tablet On tab po daily  . rosuvastatin (CRESTOR) 40 MG tablet TAKE 1 TABLET EVERY DAY  . traZODone (DESYREL) 50 MG tablet Take 1 tablet (50 mg total) by mouth daily.   No facility-administered encounter medications on file as of 06/01/2020.    Review of Systems  Constitutional: Negative for appetite change and unexpected weight change.  HENT: Negative for congestion and sinus pressure.        Decreased taste.   Eyes: Negative for pain and visual disturbance.  Respiratory: Negative for cough, chest tightness and shortness of breath.   Cardiovascular: Negative for chest pain, palpitations and leg swelling.  Gastrointestinal: Negative for  abdominal pain, diarrhea, nausea and vomiting.  Genitourinary: Negative for difficulty urinating and dysuria.  Musculoskeletal: Negative for joint swelling and myalgias.  Skin: Negative for color change and rash.  Neurological: Negative for dizziness, light-headedness and headaches.  Hematological: Negative for adenopathy. Does not bruise/bleed easily.  Psychiatric/Behavioral: Negative for agitation and dysphoric mood.       Objective:    Physical Exam Vitals reviewed.  Constitutional:      General: She is not in acute distress.    Appearance: Normal appearance. She is well-developed.  HENT:     Head: Normocephalic and  atraumatic.     Right Ear: External ear normal.     Left Ear: External ear normal.  Eyes:     General: No scleral icterus.       Right eye: No discharge.        Left eye: No discharge.     Conjunctiva/sclera: Conjunctivae normal.  Neck:     Thyroid: No thyromegaly.  Cardiovascular:     Rate and Rhythm: Normal rate and regular rhythm.  Pulmonary:     Effort: No tachypnea, accessory muscle usage or respiratory distress.     Breath sounds: Normal breath sounds. No decreased breath sounds or wheezing.  Chest:     Breasts:        Right: No inverted nipple, mass, nipple discharge or tenderness (no axillary adenopathy).        Left: No inverted nipple, mass, nipple discharge or tenderness (no axilarry adenopathy).  Abdominal:     General: Bowel sounds are normal.     Palpations: Abdomen is soft.     Tenderness: There is no abdominal tenderness.  Musculoskeletal:        General: No swelling or tenderness.     Cervical back: Neck supple. No tenderness.  Lymphadenopathy:     Cervical: No cervical adenopathy.  Skin:    Findings: No erythema or rash.  Neurological:     Mental Status: She is alert and oriented to person, place, and time.  Psychiatric:        Mood and Affect: Mood normal.        Behavior: Behavior normal.     BP 118/70   Pulse 84   Temp 98.1  F (36.7 C)   Resp 16   Ht 5' 1"  (1.549 m)   Wt 141 lb 3.2 oz (64 kg)   SpO2 97%   BMI 26.68 kg/m  Wt Readings from Last 3 Encounters:  06/01/20 141 lb 3.2 oz (64 kg)  02/17/20 136 lb (61.7 kg)  02/11/20 130 lb (59 kg)     Lab Results  Component Value Date   WBC 5.8 05/27/2020   HGB 11.6 (L) 05/27/2020   HCT 35.9 (L) 05/27/2020   PLT 393.0 05/27/2020   GLUCOSE 193 (H) 05/27/2020   CHOL 172 05/27/2020   TRIG 149.0 05/27/2020   HDL 47.40 05/27/2020   LDLDIRECT 188.8 06/30/2013   LDLCALC 94 05/27/2020   ALT 11 05/27/2020   AST 12 05/27/2020   NA 139 05/27/2020   K 4.3 05/27/2020   CL 105 05/27/2020   CREATININE 0.86 05/27/2020   BUN 13 05/27/2020   CO2 24 05/27/2020   TSH 3.25 02/12/2020   HGBA1C 7.8 (H) 05/27/2020   MICROALBUR <0.7 02/12/2020    MM 3D SCREEN BREAST BILATERAL  Result Date: 10/06/2019 CLINICAL DATA:  Screening. EXAM: DIGITAL SCREENING BILATERAL MAMMOGRAM WITH TOMO AND CAD COMPARISON:  Previous exam(s). ACR Breast Density Category c: The breast tissue is heterogeneously dense, which may obscure small masses. FINDINGS: There are no findings suspicious for malignancy. Images were processed with CAD. IMPRESSION: No mammographic evidence of malignancy. A result letter of this screening mammogram will be mailed directly to the patient. RECOMMENDATION: Screening mammogram in one year. (Code:[id number]-B-01Y) BI-RADS CATEGORY  1: Negative. Electronically Signed   By: Lillia Mountain M.D.   On: 10/06/2019 16:41       Assessment & Plan:   Problem List Items Addressed This Visit    Anemia    Recent labs - iron deficient anemia.  Discussed need  for GI evaluation and further w/up. She declines.  Start ferrous sulfate.  Follow cbc and iron studies.        Relevant Orders   CBC with Differential/Platelet   IBC + Ferritin   Aortic atherosclerosis ([place])    On crestor.        Carotid bruit    Plan f/u carotid ultrasound as outlined - with vascular surgery.          Relevant Orders   Ambulatory referral to Vascular Surgery   Carotid stenosis    Agreeable now for f/u carotid ultrasound.        Relevant Orders   Ambulatory referral to Vascular Surgery   GERD (gastroesophageal reflux disease)    Upper symptoms controlled. On protonix.       Health care maintenance    Physical today 06/01/20.  Mammogram 10/06/19 - Birads I.  cologuard 02/24/19 negative.       Hypercholesterolemia    On crestor.  Low cholesterol diet and exercise.  Follow lipid panel and liver function tests.        Hypothyroidism    On thyroid replacement.  Follow tsh.       Thrombocytosis ([place])    Follow cbc.       Type 2 diabetes mellitus with hyperglycemia ([facility])    Discussed low carb diet and exercise.  Follow met b and a1c.        Other Visit Diagnoses    Routine general medical examination at a health care facility    -  Primary   Colon cancer screening       Relevant Orders   Cologuard       Einar Pheasant, MD

## 2020-06-04 ENCOUNTER — Telehealth: Payer: Self-pay | Admitting: Internal Medicine

## 2020-06-04 DIAGNOSIS — E1159 Type 2 diabetes mellitus with other circulatory complications: Secondary | ICD-10-CM

## 2020-06-04 NOTE — Telephone Encounter (Signed)
Pt would like a call back

## 2020-06-04 NOTE — Telephone Encounter (Signed)
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

## 2020-06-06 ENCOUNTER — Telehealth: Payer: Self-pay | Admitting: Internal Medicine

## 2020-06-06 ENCOUNTER — Encounter: Payer: Self-pay | Admitting: Internal Medicine

## 2020-06-06 DIAGNOSIS — R0989 Other specified symptoms and signs involving the circulatory and respiratory systems: Secondary | ICD-10-CM | POA: Insufficient documentation

## 2020-06-06 NOTE — Assessment & Plan Note (Signed)
On [facility].   

## 2020-06-06 NOTE — Assessment & Plan Note (Signed)
Agreeable now for f/u carotid ultrasound.

## 2020-06-06 NOTE — Assessment & Plan Note (Signed)
On thyroid replacement.  Follow tsh.  

## 2020-06-06 NOTE — Assessment & Plan Note (Signed)
Upper symptoms controlled.  On protonix.  

## 2020-06-06 NOTE — Assessment & Plan Note (Signed)
Discussed low carb diet and exercise.  Follow met b and a1c.  

## 2020-06-06 NOTE — Assessment & Plan Note (Addendum)
Recent labs - iron deficient anemia.  Discussed need for GI evaluation and further w/up. She declines.  Start ferrous sulfate.  Follow cbc and iron studies.

## 2020-06-06 NOTE — Telephone Encounter (Signed)
Please cancel cologuard order.  In reviewing chart, she did this last year.  Too early to do now.  Thanks

## 2020-06-06 NOTE — Assessment & Plan Note (Signed)
Stable on prozac.

## 2020-06-06 NOTE — Assessment & Plan Note (Signed)
Plan f/u carotid ultrasound as outlined - with vascular surgery.

## 2020-06-06 NOTE — Assessment & Plan Note (Signed)
Follow cbc.  

## 2020-06-06 NOTE — Assessment & Plan Note (Signed)
Physical today 06/01/20.  Mammogram 10/06/19 - Birads I.  cologuard 02/24/19 negative.

## 2020-06-06 NOTE — Assessment & Plan Note (Signed)
On crestor.  Low cholesterol diet and exercise.  Follow lipid panel and liver function tests.   

## 2020-06-07 MED ORDER — FERROUS SULFATE 325 (65 FE) MG PO TABS: 325.0000 mg | ORAL_TABLET | Freq: Every day | ORAL | 3 refills | Status: DC

## 2020-06-07 MED ORDER — BLOOD GLUCOSE MONITOR KIT: PACK | 0 refills | Status: DC

## 2020-06-07 NOTE — Telephone Encounter (Signed)
Order cancelled

## 2020-06-08 NOTE — Telephone Encounter (Signed)
Pt called back and stated that [facility] said the are not sending one need you to give her

## 2020-06-08 NOTE — Telephone Encounter (Signed)
Patient is coming tomorrow to pick up glucometer

## 2020-06-08 NOTE — Telephone Encounter (Signed)
Called patient to let her know that I have sent in the rx for her glucometer but if it is going to be too many days before she gets hers, I will have her come to the office to pick one up.

## 2020-06-08 NOTE — Telephone Encounter (Signed)
Pt called in need a glucose meter

## 2020-06-08 NOTE — Telephone Encounter (Signed)
Pt would like a call back

## 2020-06-09 NOTE — Telephone Encounter (Signed)
Pt called and stated that she does not nee the glucometer now she got it work had to use other strips and it started working and Thank you

## 2020-06-10 NOTE — Telephone Encounter (Signed)
Noted  

## 2020-06-22 ENCOUNTER — Telehealth: Payer: Self-pay | Admitting: Internal Medicine

## 2020-06-22 NOTE — Telephone Encounter (Signed)
Pt called in requesting refills stated that she has been waiting for two weeks for them cologuard ,iron pills, diabetes supplies

## 2020-06-25 NOTE — Telephone Encounter (Signed)
Spoke with pt, it is too soon for cologuard. Prescriptions were sent to pharmacy. Going to call pharmacy to clarify

## 2020-06-28 ENCOUNTER — Telehealth: Payer: Self-pay | Admitting: Internal Medicine

## 2020-06-28 NOTE — Telephone Encounter (Signed)
Nia from [facility] called to see if we received the form for diabetic supplies. Their number is [phone]

## 2020-07-02 NOTE — Telephone Encounter (Signed)
Unable to reach. Called number listed and was given message that it was outside of their business hours.

## 2020-07-05 DIAGNOSIS — E119 Type 2 diabetes mellitus without complications: Secondary | ICD-10-CM | POA: Diagnosis not present

## 2020-07-05 LAB — HM DIABETES EYE EXAM

## 2020-07-05 NOTE — Telephone Encounter (Signed)
[facility] called needing the faxed form filled out so they can ship patient supply faxed it on 07-01-20 contact number is [phone]

## 2020-07-05 NOTE — Telephone Encounter (Signed)
[facility] called you back. They don't understand what is going on with the phone number because it is accurate. She also said please call them back because they have been trying since June to get this straight.

## 2020-07-06 ENCOUNTER — Telehealth: Payer: Self-pay | Admitting: Internal Medicine

## 2020-07-06 ENCOUNTER — Ambulatory Visit (INDEPENDENT_AMBULATORY_CARE_PROVIDER_SITE_OTHER): Payer: Medicare Other | Admitting: Pharmacist

## 2020-07-06 ENCOUNTER — Other Ambulatory Visit: Payer: Self-pay

## 2020-07-06 DIAGNOSIS — F320 Major depressive disorder, single episode, mild: Secondary | ICD-10-CM

## 2020-07-06 DIAGNOSIS — E1165 Type 2 diabetes mellitus with hyperglycemia: Secondary | ICD-10-CM

## 2020-07-06 DIAGNOSIS — E7800 Pure hypercholesterolemia, unspecified: Secondary | ICD-10-CM | POA: Diagnosis not present

## 2020-07-06 DIAGNOSIS — F32A Depression, unspecified: Secondary | ICD-10-CM

## 2020-07-06 MED ORDER — FERROUS SULFATE 325 (65 FE) MG PO TABS: 325.0000 mg | ORAL_TABLET | Freq: Every day | ORAL | 3 refills | Status: DC

## 2020-07-06 MED ORDER — FLUOXETINE HCL 20 MG PO CAPS: 20.0000 mg | ORAL_CAPSULE | Freq: Every day | ORAL | 1 refills | Status: DC

## 2020-07-06 NOTE — Telephone Encounter (Signed)
Returned patient call. See CCM documentation 

## 2020-07-06 NOTE — Chronic Care Management (AMB) (Signed)
Chronic Care Management   Note  07/06/2020 Name: Jody Wang MRN: [id number] DOB: [birth year]   Subjective:  Jody Wang is a 74 y.o. year old female who is a primary care patient of Einar Pheasant, MD. The CCM team was consulted for assistance with chronic disease management and care coordination needs.    Received call from patient today with medication concerns. Has been almost 1 year since we last spoke.   Jody Wang was given information about Chronic Care Management services today including:  1. CCM service includes personalized support from designated clinical staff supervised by her physician, including individualized plan of care and coordination with other care providers 2. 24/7 contact phone numbers for assistance for urgent and routine care needs. 3. Service will only be billed when office clinical staff spend 20 minutes or more in a month to coordinate care. 4. Only one practitioner may furnish and bill the service in a calendar month. 5. The patient may stop CCM services at any time (effective at the end of the month) by phone call to the office staff. 6. The patient will be responsible for cost sharing (co-pay) of up to 20% of the service fee (after annual deductible is met).  Patient agreed to services and verbal consent obtained.   Review of patient status, including review of consultants reports, laboratory and other test data, was performed as part of comprehensive evaluation and provision of chronic care management services.   SDOH (Social Determinants of Health) assessments and interventions performed:  SDOH Interventions     Most Recent Value  SDOH Interventions  Financial Strain Interventions Other (Comment)  [medication assistance]       Objective:  Lab Results  Component Value Date   CREATININE 0.86 05/27/2020   CREATININE 0.86 02/12/2020   CREATININE 0.84 04/18/2018    Lab Results  Component Value Date   HGBA1C 7.8 (H) 05/27/2020        Component Value Date/Time   CHOL 172 05/27/2020 1058   TRIG 149.0 05/27/2020 1058   HDL 47.40 05/27/2020 1058   CHOLHDL 4 05/27/2020 1058   VLDL 29.8 05/27/2020 1058   LDLCALC 94 05/27/2020 1058   LDLDIRECT 188.8 06/30/2013 1131    Clinical ASCVD: Yes - CAD The 10-year ASCVD risk score Jody Wang) is: 22.4%   Values used to calculate the score:     Age: 68 years     Sex: Female     Is Non-Hispanic African American: No     Diabetic: Yes     Tobacco smoker: No     Systolic Blood Pressure: 627 mmHg     Is BP treated: No     HDL Cholesterol: 47.4 mg/dL     Total Cholesterol: 172 mg/dL    BP Readings from Last 3 Encounters:  06/01/20 118/70  02/17/20 124/76  12/10/18 124/74    Allergies  Allergen Reactions  . Codeine Shortness Of Breath    stops breathing  . Meperidine And Related Shortness Of Breath    Stops breathing  . Oxycontin [Oxycodone Hcl] Shortness Of Breath    Stops breathing  . Jardiance [Empagliflozin] Other (See Comments)    Yeast infections  . Tape Rash    Paper tape ok    Medications Reviewed Today    Reviewed by De Hollingshead, [facility] (Pharmacist) on 07/06/20 at [place] List Status: <None>  Medication Order Taking? Sig Documenting Provider Last Dose Status Informant  aspirin 81 MG chewable  tablet [id number] No Chew 81 mg by mouth daily. [provider] Taking Active   blood glucose meter kit and supplies KIT [id number]  Dispense based on patient and insurance preference. Use to check blood sugars twice daily. DX E11.9 Einar Pheasant, MD  Active   Blood Glucose Monitoring Suppl [facility] VERIO) w/Device KIT [id number] No 1 Device by Does not apply route daily. Einar Pheasant, MD Taking Active   cholecalciferol (VITAMIN D) 1000 units tablet [id number] No Take 1,000 Units by mouth daily. [provider] Taking Active Self  cyanocobalamin 1000 MCG tablet [id number] No Take 1,000 mcg by mouth daily. [provider] Taking Active Self  diclofenac (VOLTAREN) 50 MG EC tablet [id number] No TAKE 1 TABLET TWICE DAILY AS NEEDED Crecencio Mc, MD Taking Active   Dulaglutide (TRULICITY) 6.80 SU/1.1SR SOPN [id number] No Inject 0.75 mg into the skin once a week. Einar Pheasant, MD Taking Active   ferrous sulfate 325 (65 FE) MG tablet [id number]  Take 1 tablet (325 mg total) by mouth daily with breakfast. Einar Pheasant, MD  Active   FLUoxetine (PROZAC) 20 MG capsule [id number]  Take 1 capsule (20 mg total) by mouth daily. Einar Pheasant, MD  Active   fluticasone [facility]) 50 MCG/ACT nasal spray [id number] No PLACE 2 SPRAYS INTO BOTH NOSTRILS DAILY. Einar Pheasant, MD Taking Active   glucose blood [employer] VERIO) test strip [id number] No TEST BLOOD SUGAR TWICE DAILY AS DIRECTED Einar Pheasant, MD Taking Active   glucose blood test strip [id number] No Use as instructed to check blood sugars to check blood sugars twice daily. Dx E11.9 Einar Pheasant, MD Taking Active   Lancets [facility] ULTRASOFT) lancets [id number] No TEST BLOOD SUGAR TWICE DAILY Einar Pheasant, MD Taking Active   levocetirizine (XYZAL) 5 MG tablet [id number] No TAKE 1 TABLET EVERY DAY Einar Pheasant, MD Taking Active   levothyroxine (SYNTHROID) 88 MCG tablet [id number] No TAKE 1 TABLET EVERY DAY Einar Pheasant, MD Taking Active   metFORMIN (GLUCOPHAGE-XR) 500 MG 24 hr tablet [id number] No TAKE 1 TABLET TWICE DAILY Einar Pheasant, MD Taking Active   [facility] VERIO test strip [id number] No USE AS INSTRUCTED TO CHECK BLOOD SUGARS TWICE DAILY.  Einar Pheasant, MD Taking Active   pantoprazole (PROTONIX) 40 MG tablet [id number] No On tab po daily Einar Pheasant, MD Taking Active   rosuvastatin (CRESTOR) 40 MG tablet [id number] No TAKE 1 TABLET EVERY DAY Einar Pheasant, MD Taking Active   traZODone (DESYREL) 50 MG tablet [id number] No Take 1 tablet (50 mg total) by mouth daily. Einar Pheasant, MD Taking Active            Assessment:    Goals Addressed              This Visit's Progress     Patient Stated   .  PharmD "I cannot afford this medication" (pt-stated)        CARE PLAN ENTRY (see longitudinal plan of care for additional care plan information)  Current Barriers:  . Social, financial, community barriers:  o Reports needing to reapply for Trulicity patient assistance through [employer] . Diabetes: uncontrolled; complicated by chronic medical conditions including HLD, depression, CAD, most recent A1c 7.8% . Most recent eGFR: 64 mL/min . Current antihyperglycemic regimen: metformin XR 131 mg BID, Trulicity 4.38 mg weekly  . Current blood glucose readings:  o [place]: 140-150s . Cardiovascular risk reduction: o Current hypertensive regimen: n/a o Current hyperlipidemia regimen: rosuvastatin 40 mg daily, last LDL 94 - could consider  more stringent goal <70 given CAD and DM o Current antiplatelet regimen: ASA 81 mg daily . Eye exam: due . Nephropathy screening: up to date . Foot exam: due . Depression/insomnia: fluoxetine 20 mg daily, trazodone 50 mg QPM, no concerns today . Hypothyroidism: levothyroxine 88 mcg daily, last TSH WNL . GERD: pantoprazole 40 mg daily . Allergies: levocetirizine 5 mg daily, fluticasone nasal spray daily PRN . Osteoarthritis: diclofenac 50 mg BID PRN . Supplements: Vitamin D, Vitamin B12, ferrous sulfate 435 mg daily   Pharmacist Clinical Goal(s):  Marland Kitchen Over the next 90 days, patient will work with PharmD and primary care provider to address optimized medication management  Interventions: . Comprehensive medication review performed, medication list updated in electronic medical record . Inter-disciplinary care team collaboration (see longitudinal plan of care) . A1c not at goal. Increase Trulicity to 1.5 mg weekly. Patient amenable to this change.  . Discussed re-application process for patient assistance for Trulicity through [employer]. Patient reports that she has ~4  weeks of therapy remaining and will be out of town next week. Will prepare patient assistance application for Trulicity, patient will come by tomorrow to fill out and provide copy of proof of income. Will collaborate w/ PCP on signature. Once all parts received, will collaborate w/ CPhT for submission and f/u.   Patient Self Care Activities:  . Patient will check blood glucose daily, document, and provide at future appointments . Patient will take medications as prescribed . Patient will report any questions or concerns to provider   Initial goal documentation        Plan: - Scheduled f/u call in ~ 6 weeks  Catie Darnelle Maffucci, PharmD, [place], [place] Pharmacist [place] [phone]

## 2020-07-06 NOTE — Addendum Note (Signed)
Addended by: De Hollingshead on: 07/06/2020 04:30 PM   Modules accepted: Orders

## 2020-07-06 NOTE — Patient Instructions (Signed)
Visit Information  Goals Addressed              This Visit's Progress     Patient Stated   .  PharmD "I cannot afford this medication" (pt-stated)        CARE PLAN ENTRY (see longitudinal plan of care for additional care plan information)  Current Barriers:  . Social, financial, community barriers:  o Reports needing to reapply for Trulicity patient assistance through [employer] . Diabetes: uncontrolled; complicated by chronic medical conditions including HLD, depression, CAD, most recent A1c 7.8% . Most recent eGFR: 64 mL/min . Current antihyperglycemic regimen: metformin XR 979 mg BID, Trulicity 8.92 mg weekly  . Current blood glucose readings:  o Fastings: 140-150s . Cardiovascular risk reduction: o Current hypertensive regimen: n/a o Current hyperlipidemia regimen: rosuvastatin 40 mg daily, last LDL 94 - could consider more stringent goal <70 given CAD and DM o Current antiplatelet regimen: ASA 81 mg daily . Eye exam: due . Nephropathy screening: up to date . Foot exam: due . Depression/insomnia: fluoxetine 20 mg daily, trazodone 50 mg QPM, no concerns today . Hypothyroidism: levothyroxine 88 mcg daily, last TSH WNL . GERD: pantoprazole 40 mg daily . Allergies: levocetirizine 5 mg daily, fluticasone nasal spray daily PRN . Osteoarthritis: diclofenac 50 mg BID PRN . Supplements: Vitamin D, Vitamin B12, ferrous sulfate 435 mg daily   Pharmacist Clinical Goal(s):  Marland Kitchen Over the next 90 days, patient will work with PharmD and primary care provider to address optimized medication management  Interventions: . Comprehensive medication review performed, medication list updated in electronic medical record . Inter-disciplinary care team collaboration (see longitudinal plan of care) . A1c not at goal. Increase Trulicity to 1.5 mg weekly. Patient amenable to this change.  . Discussed re-application process for patient assistance for Trulicity through [employer]. Patient reports  that she has ~4 weeks of therapy remaining and will be out of town next week. Will prepare patient assistance application for Trulicity, patient will come by tomorrow to fill out and provide copy of proof of income. Will collaborate w/ PCP on signature. Once all parts received, will collaborate w/ CPhT for submission and f/u.   Patient Self Care Activities:  . Patient will check blood glucose daily, document, and provide at future appointments . Patient will take medications as prescribed . Patient will report any questions or concerns to provider   Initial goal documentation        Jody Wang was given information about Chronic Care Management services today including:  1. CCM service includes personalized support from designated clinical staff supervised by her physician, including individualized plan of care and coordination with other care providers 2. 24/7 contact phone numbers for assistance for urgent and routine care needs. 3. Service will only be billed when office clinical staff spend 20 minutes or more in a month to coordinate care. 4. Only one practitioner may furnish and bill the service in a calendar month. 5. The patient may stop CCM services at any time (effective at the end of the month) by phone call to the office staff. 6. The patient will be responsible for cost sharing (co-pay) of up to 20% of the service fee (after annual deductible is met).  Patient agreed to services and verbal consent obtained.   The patient verbalized understanding of instructions provided today and declined a print copy of patient instruction materials.   Plan: - Scheduled f/u call in ~ 6 weeks  Catie Darnelle Maffucci, PharmD, [place], CPP  [place] [phone]

## 2020-07-06 NOTE — Telephone Encounter (Signed)
Pt would like for you to give her a call regarding her Trulicity prescription.

## 2020-07-06 NOTE — Telephone Encounter (Signed)
[facility] called for patient for prescription for FLUoxetine (PROZAC) 20 MG capsule,  ferrous sulfate 325 (65 FE) MG tablet patient stated that she had been waiting for about four week to get her medication contact number for pharmacy is fax : [phone], phone number [phone]

## 2020-07-06 NOTE — Telephone Encounter (Signed)
Resent both rx to pharmacy

## 2020-07-07 ENCOUNTER — Ambulatory Visit: Payer: Medicare Other | Admitting: Pharmacist

## 2020-07-07 DIAGNOSIS — E1165 Type 2 diabetes mellitus with hyperglycemia: Secondary | ICD-10-CM

## 2020-07-07 NOTE — Chronic Care Management (AMB) (Signed)
Chronic Care Management   Follow Up Note   07/07/2020 Name: Jody Wang MRN: [id number] DOB: 05/10/1946  Referred by: Einar Pheasant, MD Reason for referral : Chronic Care Management (Medication Management)   Jody Wang is a 74 y.o. year old female who is a primary care patient of Einar Pheasant, MD. The CCM team was consulted for assistance with chronic disease management and care coordination needs.    Care coordination completed today.  Review of patient status, including review of consultants reports, relevant laboratory and other test results, and collaboration with appropriate care team members and the patient's provider was performed as part of comprehensive patient evaluation and provision of chronic care management services.    SDOH (Social Determinants of Health) assessments performed: Yes See Care Plan activities for detailed interventions related to [facility])     Outpatient Encounter Medications as of 07/07/2020  Medication Sig   aspirin 81 MG chewable tablet Chew 81 mg by mouth daily.   blood glucose meter kit and supplies KIT Dispense based on patient and insurance preference. Use to check blood sugars twice daily. DX E11.9   Blood Glucose Monitoring Suppl (ONETOUCH VERIO) w/Device KIT 1 Device by Does not apply route daily.   cholecalciferol (VITAMIN D) 1000 units tablet Take 1,000 Units by mouth daily.   cyanocobalamin 1000 MCG tablet Take 1,000 mcg by mouth daily.   diclofenac (VOLTAREN) 50 MG EC tablet TAKE 1 TABLET TWICE DAILY AS NEEDED   Dulaglutide (TRULICITY) 2.37 SE/8.3TD SOPN Inject 0.75 mg into the skin once a week.   ferrous sulfate 325 (65 FE) MG tablet Take 1 tablet (325 mg total) by mouth daily with breakfast.   FLUoxetine (PROZAC) 20 MG capsule Take 1 capsule (20 mg total) by mouth daily.   fluticasone (FLONASE) 50 MCG/ACT nasal spray PLACE 2 SPRAYS INTO BOTH NOSTRILS DAILY.   glucose blood (ONETOUCH VERIO) test strip TEST BLOOD  SUGAR TWICE DAILY AS DIRECTED   glucose blood test strip Use as instructed to check blood sugars to check blood sugars twice daily. Dx E11.9   Lancets (ONETOUCH ULTRASOFT) lancets TEST BLOOD SUGAR TWICE DAILY   levocetirizine (XYZAL) 5 MG tablet TAKE 1 TABLET EVERY DAY   levothyroxine (SYNTHROID) 88 MCG tablet TAKE 1 TABLET EVERY DAY   metFORMIN (GLUCOPHAGE-XR) 500 MG 24 hr tablet TAKE 1 TABLET TWICE DAILY   ONETOUCH VERIO test strip USE AS INSTRUCTED TO CHECK BLOOD SUGARS TWICE DAILY.    pantoprazole (PROTONIX) 40 MG tablet On tab po daily   rosuvastatin (CRESTOR) 40 MG tablet TAKE 1 TABLET EVERY DAY   traZODone (DESYREL) 50 MG tablet Take 1 tablet (50 mg total) by mouth daily.   No facility-administered encounter medications on file as of 07/07/2020.     Objective:   Goals Addressed              This Visit's Progress     Patient Stated     PharmD "I cannot afford this medication" (pt-stated)        CARE PLAN ENTRY (see longitudinal plan of care for additional care plan information)  Current Barriers:   Social, financial, community barriers:  o Reports needing to reapply for Trulicity patient assistance through [employer]. Patient brought forms by today  Diabetes: uncontrolled; complicated by chronic medical conditions including HLD, depression, CAD, most recent A1c 7.8%  Most recent eGFR: 64 mL/min  Current antihyperglycemic regimen: metformin XR 176 mg BID, Trulicity 1.60 mg weekly   Cardiovascular risk reduction: o  Current hypertensive regimen: n/a o Current hyperlipidemia regimen: rosuvastatin 40 mg daily, last LDL 94 - could consider more stringent goal <70 given CAD and DM o Current antiplatelet regimen: ASA 81 mg daily  Eye exam: due  Nephropathy screening: up to date  Foot exam: due  Depression/insomnia: fluoxetine 20 mg daily, trazodone 50 mg QPM, no concerns today  Hypothyroidism: levothyroxine 88 mcg daily, last TSH WNL  GERD: pantoprazole  40 mg daily  Allergies: levocetirizine 5 mg daily, fluticasone nasal spray daily PRN  Osteoarthritis: diclofenac 50 mg BID PRN  Supplements: Vitamin D, Vitamin B12, ferrous sulfate 435 mg daily   Pharmacist Clinical Goal(s):   Over the next 90 days, patient will work with PharmD and primary care provider to address optimized medication management  Interventions:  Comprehensive medication review performed, medication list updated in electronic medical record  Inter-disciplinary care team collaboration (see longitudinal plan of care)  Received patient portions and provider portions of [employer] application for Trulicity. Submitted to [employer]. Will collaborate w/ CPhT for follow up.  Patient Self Care Activities:   Patient will check blood glucose daily, document, and provide at future appointments  Patient will take medications as prescribed  Patient will report any questions or concerns to provider   Please see past updates related to this goal by clicking on the "Past Updates" button in the selected goal          Plan:  - Will outreach as previously scheduled  Catie Darnelle Maffucci, PharmD, [place], [place] Pharmacist [place] [phone]

## 2020-07-07 NOTE — Telephone Encounter (Signed)
Form rec'd yesterday. On my desk for completion

## 2020-07-07 NOTE — Progress Notes (Signed)
I have reviewed the above note and agree. I was available to the pharmacist for consultation.  Claribel Sachs, MD 

## 2020-07-07 NOTE — Progress Notes (Signed)
I have reviewed the above note and agree. I was available to the pharmacist for consultation.  Courtez Twaddle, MD 

## 2020-07-07 NOTE — Patient Instructions (Signed)
Visit Information  Goals Addressed              This Visit's Progress     Patient Stated   .  PharmD "I cannot afford this medication" (pt-stated)        CARE PLAN ENTRY (see longitudinal plan of care for additional care plan information)  Current Barriers:  . Social, financial, community barriers:  o Reports needing to reapply for Trulicity patient assistance through [employer]. Patient brought forms by today . Diabetes: uncontrolled; complicated by chronic medical conditions including HLD, depression, CAD, most recent A1c 7.8% . Most recent eGFR: 64 mL/min . Current antihyperglycemic regimen: metformin XR 389 mg BID, Trulicity 3.73 mg weekly  . Cardiovascular risk reduction: o Current hypertensive regimen: n/a o Current hyperlipidemia regimen: rosuvastatin 40 mg daily, last LDL 94 - could consider more stringent goal <70 given CAD and DM o Current antiplatelet regimen: ASA 81 mg daily . Eye exam: due . Nephropathy screening: up to date . Foot exam: due . Depression/insomnia: fluoxetine 20 mg daily, trazodone 50 mg QPM, no concerns today . Hypothyroidism: levothyroxine 88 mcg daily, last TSH WNL . GERD: pantoprazole 40 mg daily . Allergies: levocetirizine 5 mg daily, fluticasone nasal spray daily PRN . Osteoarthritis: diclofenac 50 mg BID PRN . Supplements: Vitamin D, Vitamin B12, ferrous sulfate 435 mg daily   Pharmacist Clinical Goal(s):  Marland Kitchen Over the next 90 days, patient will work with PharmD and primary care provider to address optimized medication management  Interventions: . Comprehensive medication review performed, medication list updated in electronic medical record . Inter-disciplinary care team collaboration (see longitudinal plan of care) . Received patient portions and provider portions of [employer] application for [employer]. Submitted to [employer]. Will collaborate w/ CPhT for follow up.  Patient Self Care Activities:  . Patient will check blood glucose  daily, document, and provide at future appointments . Patient will take medications as prescribed . Patient will report any questions or concerns to provider   Please see past updates related to this goal by clicking on the "Past Updates" button in the selected goal         The patient verbalized understanding of instructions provided today and declined a print copy of patient instruction materials.   Plan:  - Will outreach as previously scheduled  Catie Darnelle Maffucci, PharmD, [place], [place] Pharmacist [place] [phone]

## 2020-07-09 ENCOUNTER — Telehealth: Payer: Self-pay | Admitting: Internal Medicine

## 2020-07-09 MED ORDER — FLUTICASONE PROPIONATE 50 MCG/ACT NA SUSP: 2.0000 | Freq: Every day | NASAL | 3 refills | Status: DC

## 2020-07-09 NOTE — Telephone Encounter (Signed)
Pt needs as new rx for fluticasone (FLONASE) 50 MCG/ACT nasal spray sent to [facility] mail

## 2020-07-09 NOTE — Addendum Note (Signed)
Addended by: Elpidio Galea T on: 07/09/2020 12:56 PM   Modules accepted: Orders

## 2020-07-13 NOTE — Telephone Encounter (Signed)
Faxed form.

## 2020-07-19 ENCOUNTER — Other Ambulatory Visit: Payer: Medicare Other

## 2020-07-26 ENCOUNTER — Other Ambulatory Visit: Payer: Self-pay

## 2020-07-26 ENCOUNTER — Other Ambulatory Visit (INDEPENDENT_AMBULATORY_CARE_PROVIDER_SITE_OTHER): Payer: Medicare Other

## 2020-07-26 DIAGNOSIS — D649 Anemia, unspecified: Secondary | ICD-10-CM | POA: Diagnosis not present

## 2020-07-27 LAB — CBC WITH DIFFERENTIAL/PLATELET
Basophils Absolute: 0.1 10*3/uL (ref 0.0–0.1)
Basophils Relative: 0.8 % (ref 0.0–3.0)
Eosinophils Absolute: 0.4 10*3/uL (ref 0.0–0.7)
Eosinophils Relative: 3.4 % (ref 0.0–5.0)
HCT: 37.7 % (ref 36.0–46.0)
Hemoglobin: 12.1 g/dL (ref 12.0–15.0)
Lymphocytes Relative: 17.3 % (ref 12.0–46.0)
Lymphs Abs: 1.9 10*3/uL (ref 0.7–4.0)
MCHC: 32 g/dL (ref 30.0–36.0)
MCV: 84.7 fl (ref 78.0–100.0)
Monocytes Absolute: 0.9 10*3/uL (ref 0.1–1.0)
Monocytes Relative: 7.7 % (ref 3.0–12.0)
Neutro Abs: 8 10*3/uL — ABNORMAL HIGH (ref 1.4–7.7)
Neutrophils Relative %: 70.8 % (ref 43.0–77.0)
Platelets: 352 10*3/uL (ref 150.0–400.0)
RBC: 4.45 Mil/uL (ref 3.87–5.11)
RDW: 17.5 % — ABNORMAL HIGH (ref 11.5–15.5)
WBC: 11.3 10*3/uL — ABNORMAL HIGH (ref 4.0–10.5)

## 2020-07-27 LAB — IBC + FERRITIN
Ferritin: 13.1 ng/mL (ref 10.0–291.0)
Iron: 45 ug/dL (ref 42–145)
Saturation Ratios: 11.6 % — ABNORMAL LOW (ref 20.0–50.0)
Transferrin: 276 mg/dL (ref 212.0–360.0)

## 2020-08-10 ENCOUNTER — Telehealth: Payer: Self-pay | Admitting: Internal Medicine

## 2020-08-10 ENCOUNTER — Telehealth: Payer: Medicare Other

## 2020-08-10 NOTE — Telephone Encounter (Signed)
Patient called in stated that she only have 4 week left of Dulaglutide (TRULICITY) 4.82 NO/0.3BC SOPN need refill and she stated that Dr.Scott was going to increase it

## 2020-08-10 NOTE — Progress Notes (Signed)
Returned call. LVM for patient to call me back on my direct line

## 2020-08-10 NOTE — Telephone Encounter (Signed)
Please call pt and notify her that Catie has requested her medication and it should be in soon.  Catie has been trying to get in touch with her.

## 2020-08-11 NOTE — Progress Notes (Addendum)
Attempted to call patient again; LVM on home number, unable to LVM on cell.   If patient returns call, please ask her to call [employer] at [phone] to follow up on when shipment of Trulicity 1.5 mg is expected.   I also need my next appointment with her to be rescheduled (I had a meeting pop up); my scheduler is going to reach out to reschedule for early November

## 2020-08-25 ENCOUNTER — Telehealth: Payer: Medicare Other

## 2020-08-30 ENCOUNTER — Ambulatory Visit: Payer: Medicare Other | Admitting: Pharmacist

## 2020-08-30 DIAGNOSIS — E1165 Type 2 diabetes mellitus with hyperglycemia: Secondary | ICD-10-CM

## 2020-08-30 DIAGNOSIS — E7800 Pure hypercholesterolemia, unspecified: Secondary | ICD-10-CM

## 2020-08-30 DIAGNOSIS — I25118 Atherosclerotic heart disease of native coronary artery with other forms of angina pectoris: Secondary | ICD-10-CM

## 2020-08-30 MED ORDER — TRULICITY 1.5 MG/0.5ML ~~LOC~~ SOAJ: 1.5000 mg | SUBCUTANEOUS | 4 refills | Status: DC

## 2020-08-30 NOTE — Chronic Care Management (AMB) (Addendum)
Chronic Care Management   Follow Up Note   08/30/2020 Name: Jody Wang MRN: [id number] DOB: 1945/11/14  Referred by: Einar Pheasant, MD Reason for referral : Chronic Care Management (Medication Management)   Jody Wang is a 74 y.o. year old female who is a primary care patient of Einar Pheasant, MD. The CCM team was consulted for assistance with chronic disease management and care coordination needs.    Contacted patient for medication management review.  Review of patient status, including review of consultants reports, relevant laboratory and other test results, and collaboration with appropriate care team members and the patient's provider was performed as part of comprehensive patient evaluation and provision of chronic care management services.    SDOH (Social Determinants of Health) assessments performed: Yes See Care Plan activities for detailed interventions related to SDOH)  SDOH Interventions     Most Recent Value  SDOH Interventions  Financial Strain Interventions Other (Comment)  [manufacturer assistance]       Outpatient Encounter Medications as of 08/30/2020  Medication Sig  . aspirin 81 MG chewable tablet Chew 81 mg by mouth daily.  . blood glucose meter kit and supplies KIT Dispense based on patient and insurance preference. Use to check blood sugars twice daily. DX E11.9  . Blood Glucose Monitoring Suppl (ONETOUCH VERIO) w/Device KIT 1 Device by Does not apply route daily.  . cholecalciferol (VITAMIN D) 1000 units tablet Take 1,000 Units by mouth daily.  . cyanocobalamin 1000 MCG tablet Take 1,000 mcg by mouth daily.  . diclofenac (VOLTAREN) 50 MG EC tablet TAKE 1 TABLET TWICE DAILY AS NEEDED  . FLUoxetine (PROZAC) 20 MG capsule Take 1 capsule (20 mg total) by mouth daily.  . fluticasone (FLONASE) 50 MCG/ACT nasal spray Place 2 sprays into both nostrils daily.  Marland Kitchen glucose blood (ONETOUCH VERIO) test strip TEST BLOOD SUGAR TWICE DAILY AS  DIRECTED  . glucose blood test strip Use as instructed to check blood sugars to check blood sugars twice daily. Dx E11.9  . Lancets (ONETOUCH ULTRASOFT) lancets TEST BLOOD SUGAR TWICE DAILY  . levocetirizine (XYZAL) 5 MG tablet TAKE 1 TABLET EVERY DAY  . levothyroxine (SYNTHROID) 88 MCG tablet TAKE 1 TABLET EVERY DAY  . metFORMIN (GLUCOPHAGE-XR) 500 MG 24 hr tablet TAKE 1 TABLET TWICE DAILY  . ONETOUCH VERIO test strip USE AS INSTRUCTED TO CHECK BLOOD SUGARS TWICE DAILY.   . pantoprazole (PROTONIX) 40 MG tablet On tab po daily  . rosuvastatin (CRESTOR) 40 MG tablet TAKE 1 TABLET EVERY DAY  . traZODone (DESYREL) 50 MG tablet Take 1 tablet (50 mg total) by mouth daily.  . [DISCONTINUED] Dulaglutide (TRULICITY) 2.87 OM/7.6HM SOPN Inject 0.75 mg into the skin once a week.  . Dulaglutide (TRULICITY) 1.5 CN/4.7SJ SOPN Inject 1.5 mg into the skin once a week.  . ferrous sulfate 325 (65 FE) MG tablet Take 1 tablet (325 mg total) by mouth daily with breakfast. (Patient not taking: Reported on 08/30/2020)   No facility-administered encounter medications on file as of 08/30/2020.     Objective:   Goals Addressed              This Visit's Progress     Patient Stated   .  PharmD "I cannot afford this medication" (pt-stated)        CARE PLAN ENTRY (see longitudinal plan of care for additional care plan information)  Current Barriers:  . Social, financial, community barriers:  o Receiving Trulicity through patient assistance.  . Diabetes:  uncontrolled; complicated by chronic medical conditions including HLD, depression, CAD, most recent A1c 7.8%- Dr. Nicki Reaper had intended to increase Trulicity to 1.5 mg weekly, but appears there was miscommunication w/ prescription assistance program, and patient has been taking 0.75 mg weekly.  . Most recent eGFR: 64 mL/min . Current antihyperglycemic regimen: metformin XR 658 mg BID, Trulicity 2.60 mg weekly  . Current glucose readings:  o Post-diet coke  morning reading: 150s; occasionally checks about 3 pm and has similar readings . Cardiovascular risk reduction: o Current hypertensive regimen: n/a o Current hyperlipidemia regimen: rosuvastatin 40 mg daily, last LDL 94 - could consider more stringent goal <70 given CAD and DM o Current antiplatelet regimen: ASA 81 mg daily . Eye exam: up to date . Nephropathy screening: up to date . Foot exam: due . Depression/insomnia: fluoxetine 20 mg daily, trazodone 50 mg QPM (sleep); denies any mood related concerns today . Hypothyroidism: levothyroxine 88 mcg daily, last TSH WNL . GERD: pantoprazole 40 mg daily . Allergies: levocetirizine 5 mg daily, fluticasone nasal spray daily PRN . Osteoarthritis: diclofenac 50 mg BID PRN- reports taking BID  . Supplements: Vitamin D, Vitamin B12, ferrous sulfate 325 mg daily - reports she has discontinued OTC iron d/t diarrhea  Pharmacist Clinical Goal(s):  Marland Kitchen Over the next 90 days, patient will work with PharmD and primary care provider to address optimized medication management  Interventions: . Comprehensive medication review performed, medication list updated in electronic medical record . Inter-disciplinary care team collaboration (see longitudinal plan of care) . Given elevated readings and elevated A1c on last check, increase Trulicity to 1.5 mg weekly. Patient just received 4 month supply from patient assistance; encouraged to give 2 injections of 0.75 mg weekly to use current supply. Faxing updates script to [employer] today . Continue metformin XR 1000 mg daily . Discussed trial of Slow Fe formulation of iron supplement. If she still has diarrhea, could reduce to every other day dosing of iron supplement to see if this improves tolerability.  . Reviewed goal A1c, goal fasting, and goal 2 hour post prandial glucose.  . Reviewed upcoming appointments.  Patient Self Care Activities:  . Patient will check blood glucose daily, document, and provide at  future appointments . Patient will take medications as prescribed . Patient will report any questions or concerns to provider   Please see past updates related to this goal by clicking on the "Past Updates" button in the selected goal          Plan:  - Scheduled f/u call in ~ 6 weeks  Catie Darnelle Maffucci, PharmD, [place], [place] Pharmacist [place] [phone]

## 2020-08-30 NOTE — Patient Instructions (Addendum)
Ms. Jimerson,   It was great talking to you today!  Let's increase the Trulicity to 1.5 mg weekly. You can do 2 shots of the 0.75 mg, one after the other, every week to use the supply you just received. When we talk in December and reapply for patient assistance, we will apply for the 1.5 mg strength.   So that we can gauge the impact of this dose change, please check your blood sugars daily - alternate between 1) fasting (before eating) and 2) about 2 hours after the largest meal of your day. Remember, for an A1c of <7%, our goal fasting readings are <130 and 2 hour after meal <180.   Try the Slow Fe iron formulation. If this still causes diarrhea, we could try taking it every other day instead.   As always, call me with any questions or concerns!  Catie Darnelle Maffucci, PharmD [phone]   Visit Information  Goals Addressed              This Visit's Progress     Patient Stated   .  PharmD "I cannot afford this medication" (pt-stated)        CARE PLAN ENTRY (see longitudinal plan of care for additional care plan information)  Current Barriers:  . Social, financial, community barriers:  o Receiving Trulicity through patient assistance.  . Diabetes: uncontrolled; complicated by chronic medical conditions including HLD, depression, CAD, most recent A1c 7.8%- Dr. Nicki Reaper had intended to increase Trulicity to 1.5 mg weekly, but appears there was miscommunication w/ prescription assistance program, and patient has been taking 0.75 mg weekly.  . Most recent eGFR: 64 mL/min . Current antihyperglycemic regimen: metformin XR 580 mg BID, Trulicity 9.98 mg weekly  . Current glucose readings:  o Post-diet coke morning reading: 150s; occasionally checks about 3 pm and has similar readings . Cardiovascular risk reduction: o Current hypertensive regimen: n/a o Current hyperlipidemia regimen: rosuvastatin 40 mg daily, last LDL 94 - could consider more stringent goal <70 given CAD and DM o Current  antiplatelet regimen: ASA 81 mg daily . Eye exam: up to date . Nephropathy screening: up to date . Foot exam: due . Depression/insomnia: fluoxetine 20 mg daily, trazodone 50 mg QPM (sleep); denies any mood related concerns today . Hypothyroidism: levothyroxine 88 mcg daily, last TSH WNL . GERD: pantoprazole 40 mg daily . Allergies: levocetirizine 5 mg daily, fluticasone nasal spray daily PRN . Osteoarthritis: diclofenac 50 mg BID PRN- reports taking BID  . Supplements: Vitamin D, Vitamin B12, ferrous sulfate 325 mg daily - reports she has discontinued OTC iron d/t diarrhea  Pharmacist Clinical Goal(s):  Marland Kitchen Over the next 90 days, patient will work with PharmD and primary care provider to address optimized medication management  Interventions: . Comprehensive medication review performed, medication list updated in electronic medical record . Inter-disciplinary care team collaboration (see longitudinal plan of care) . Given elevated readings and elevated A1c on last check, increase Trulicity to 1.5 mg weekly. Patient just received 4 month supply from patient assistance; encouraged to give 2 injections of 0.75 mg weekly to use current supply. Will plan to increase script to 1.5 mg weekly when reapplying for [employer] patient assistance in December.  . Continue metformin XR 1000 mg daily . Discussed trial of Slow Fe formulation of iron supplement. If she still has diarrhea, could reduce to every other day dosing of iron supplement to see if this improves tolerability.  . Reviewed goal A1c, goal fasting, and goal 2 hour  post prandial glucose.  . Reviewed upcoming appointments.  Patient Self Care Activities:  . Patient will check blood glucose daily, document, and provide at future appointments . Patient will take medications as prescribed . Patient will report any questions or concerns to provider   Please see past updates related to this goal by clicking on the "Past Updates" button in the selected  goal         The patient verbalized understanding of instructions provided today and agreed to receive a mailed copy of patient instruction and/or educational materials.  Plan:  - Scheduled f/u call in ~ 6 weeks  Catie Darnelle Maffucci, PharmD, [place], [place] Pharmacist [place] [phone]

## 2020-08-31 ENCOUNTER — Ambulatory Visit (INDEPENDENT_AMBULATORY_CARE_PROVIDER_SITE_OTHER): Payer: Self-pay | Admitting: Vascular Surgery

## 2020-09-06 ENCOUNTER — Other Ambulatory Visit: Payer: Self-pay | Admitting: Internal Medicine

## 2020-09-06 DIAGNOSIS — Z1231 Encounter for screening mammogram for malignant neoplasm of breast: Secondary | ICD-10-CM

## 2020-09-10 ENCOUNTER — Telehealth: Payer: Self-pay

## 2020-09-10 ENCOUNTER — Ambulatory Visit: Payer: Medicare Other | Admitting: Pharmacist

## 2020-09-10 DIAGNOSIS — F320 Major depressive disorder, single episode, mild: Secondary | ICD-10-CM

## 2020-09-10 DIAGNOSIS — F32A Depression, unspecified: Secondary | ICD-10-CM

## 2020-09-10 DIAGNOSIS — E7800 Pure hypercholesterolemia, unspecified: Secondary | ICD-10-CM

## 2020-09-10 DIAGNOSIS — E1165 Type 2 diabetes mellitus with hyperglycemia: Secondary | ICD-10-CM

## 2020-09-10 NOTE — Telephone Encounter (Signed)
Pt dropped off financial info and [employer] forms. Placed in your box up front

## 2020-09-10 NOTE — Chronic Care Management (AMB) (Signed)
Chronic Care Management   Follow Up Note   09/10/2020 Name: Jody Wang MRN: [id number] DOB: 06-Aug-1946  Referred by: Einar Pheasant, MD Reason for referral : Chronic Care Management (Medication Management)   Jody Wang is a 74 y.o. year old female who is a primary care patient of Einar Pheasant, MD. The CCM team was consulted for assistance with chronic disease management and care coordination needs.    Care coordination completed today.   Review of patient status, including review of consultants reports, relevant laboratory and other test results, and collaboration with appropriate care team members and the patient's provider was performed as part of comprehensive patient evaluation and provision of chronic care management services.    SDOH (Social Determinants of Health) assessments performed: Yes See Care Plan activities for detailed interventions related to SDOH)  SDOH Interventions     Most Recent Value  SDOH Interventions  Financial Strain Interventions Other (Comment)  [manufacturer assistance]       Outpatient Encounter Medications as of 09/10/2020  Medication Sig  . aspirin 81 MG chewable tablet Chew 81 mg by mouth daily.  . blood glucose meter kit and supplies KIT Dispense based on patient and insurance preference. Use to check blood sugars twice daily. DX E11.9  . Blood Glucose Monitoring Suppl (ONETOUCH VERIO) w/Device KIT 1 Device by Does not apply route daily.  . cholecalciferol (VITAMIN D) 1000 units tablet Take 1,000 Units by mouth daily.  . cyanocobalamin 1000 MCG tablet Take 1,000 mcg by mouth daily.  . diclofenac (VOLTAREN) 50 MG EC tablet TAKE 1 TABLET TWICE DAILY AS NEEDED  . Dulaglutide (TRULICITY) 1.5 JI/9.6VE SOPN Inject 1.5 mg into the skin once a week.  . ferrous sulfate 325 (65 FE) MG tablet Take 1 tablet (325 mg total) by mouth daily with breakfast. (Patient not taking: Reported on 08/30/2020)  . FLUoxetine (PROZAC) 20 MG capsule Take  1 capsule (20 mg total) by mouth daily.  . fluticasone (FLONASE) 50 MCG/ACT nasal spray Place 2 sprays into both nostrils daily.  Marland Kitchen glucose blood (ONETOUCH VERIO) test strip TEST BLOOD SUGAR TWICE DAILY AS DIRECTED  . glucose blood test strip Use as instructed to check blood sugars to check blood sugars twice daily. Dx E11.9  . Lancets (ONETOUCH ULTRASOFT) lancets TEST BLOOD SUGAR TWICE DAILY  . levocetirizine (XYZAL) 5 MG tablet TAKE 1 TABLET EVERY DAY  . levothyroxine (SYNTHROID) 88 MCG tablet TAKE 1 TABLET EVERY DAY  . metFORMIN (GLUCOPHAGE-XR) 500 MG 24 hr tablet TAKE 1 TABLET TWICE DAILY  . ONETOUCH VERIO test strip USE AS INSTRUCTED TO CHECK BLOOD SUGARS TWICE DAILY.   . pantoprazole (PROTONIX) 40 MG tablet On tab po daily  . rosuvastatin (CRESTOR) 40 MG tablet TAKE 1 TABLET EVERY DAY  . traZODone (DESYREL) 50 MG tablet Take 1 tablet (50 mg total) by mouth daily.   No facility-administered encounter medications on file as of 09/10/2020.     Objective:   Goals Addressed              This Visit's Progress     Patient Stated   .  PharmD "I cannot afford this medication" (pt-stated)        CARE PLAN ENTRY (see longitudinal plan of care for additional care plan information)  Current Barriers:  . Social, financial, community barriers:  o Due to reapply for patient assistance  . Diabetes: uncontrolled; complicated by chronic medical conditions including HLD, depression, CAD, most recent A1c 7.8%.  . Most  recent eGFR: 64 mL/min . Current antihyperglycemic regimen: metformin XR 327 mg BID, Trulicity 1.5 mg weekly . Cardiovascular risk reduction: o Current hypertensive regimen: n/a o Current hyperlipidemia regimen: rosuvastatin 40 mg daily, last LDL 94 - could consider more stringent goal <70 given CAD and DM o Current antiplatelet regimen: ASA 81 mg daily . Eye exam: up to date . Nephropathy screening: up to date . Foot exam: due . Depression/insomnia: fluoxetine 20 mg daily,  trazodone 50 mg QPM (sleep); denies any mood related concerns today . Hypothyroidism: levothyroxine 88 mcg daily, last TSH WNL . GERD: pantoprazole 40 mg daily . Allergies: levocetirizine 5 mg daily, fluticasone nasal spray daily PRN . Osteoarthritis: diclofenac 50 mg BID PRN- reports taking BID  . Supplements: Vitamin D, Vitamin B12, ferrous sulfate 325 mg daily - reports she has discontinued OTC iron d/t diarrhea  Pharmacist Clinical Goal(s):  Marland Kitchen Over the next 90 days, patient will work with PharmD and primary care provider to address optimized medication management  Interventions: . Received patient portion of [employer] application. Will collaborate w/ CPhT and PCP for completion, submission to [employer].   Patient Self Care Activities:  . Patient will check blood glucose daily, document, and provide at future appointments . Patient will take medications as prescribed . Patient will report any questions or concerns to provider   Please see past updates related to this goal by clicking on the "Past Updates" button in the selected goal          Plan:  - Will outreach as previously scheduled  Catie Darnelle Maffucci, PharmD, [place], [place] Pharmacist [facility] [phone]

## 2020-09-10 NOTE — Patient Instructions (Signed)
Visit Information  Goals Addressed              This Visit's Progress     Patient Stated   .  PharmD "I cannot afford this medication" (pt-stated)        CARE PLAN ENTRY (see longitudinal plan of care for additional care plan information)  Current Barriers:  . Social, financial, community barriers:  o Due to reapply for patient assistance  . Diabetes: uncontrolled; complicated by chronic medical conditions including HLD, depression, CAD, most recent A1c 7.8%.  . Most recent eGFR: 64 mL/min . Current antihyperglycemic regimen: metformin XR 500 mg BID, Trulicity 1.5 mg weekly . Cardiovascular risk reduction: o Current hypertensive regimen: n/a o Current hyperlipidemia regimen: rosuvastatin 40 mg daily, last LDL 94 - could consider more stringent goal <70 given CAD and DM o Current antiplatelet regimen: ASA 81 mg daily . Eye exam: up to date . Nephropathy screening: up to date . Foot exam: due . Depression/insomnia: fluoxetine 20 mg daily, trazodone 50 mg QPM (sleep); denies any mood related concerns today . Hypothyroidism: levothyroxine 88 mcg daily, last TSH WNL . GERD: pantoprazole 40 mg daily . Allergies: levocetirizine 5 mg daily, fluticasone nasal spray daily PRN . Osteoarthritis: diclofenac 50 mg BID PRN- reports taking BID  . Supplements: Vitamin D, Vitamin B12, ferrous sulfate 325 mg daily - reports she has discontinued OTC iron d/t diarrhea  Pharmacist Clinical Goal(s):  . Over the next 90 days, patient will work with PharmD and primary care provider to address optimized medication management  Interventions: . Received patient portion of [employer] application. Will collaborate w/ CPhT and PCP for completion, submission to [employer].   Patient Self Care Activities:  . Patient will check blood glucose daily, document, and provide at future appointments . Patient will take medications as prescribed . Patient will report any questions or concerns to provider   Please  see past updates related to this goal by clicking on the "Past Updates" button in the selected goal         The patient verbalized understanding of instructions provided today and declined a print copy of patient instruction materials.   Plan:  - Will outreach as previously scheduled  Catie Travis, PharmD, BCACP, CPP Clinical Pharmacist [facility] [phone] 

## 2020-09-13 ENCOUNTER — Telehealth: Payer: Self-pay | Admitting: Internal Medicine

## 2020-09-13 NOTE — Telephone Encounter (Signed)
LMTCB

## 2020-09-13 NOTE — Telephone Encounter (Signed)
Sent phone message to Larena Glassman - to call pt with recommendation.  See phone message.

## 2020-09-13 NOTE — Telephone Encounter (Signed)
Pt called and said that she is unable to take the ferrous sulfate 325 (65 FE) MG tablet do to her having diarrhea and wanted to know what she needs to do   Pt said that this was discussed at her last appt

## 2020-09-13 NOTE — Telephone Encounter (Signed)
Received a cc'd message on pt.  In message, pt stated she is unable to take ferrous sulfate due to diarrhea.  If causing diarrhea, stop and see if symptoms resolve.  Also, I had recommended a referral to GI for further evaluation of low iron.  (see last lab note).  If agreeable, let me know and I will place the order for the referral.  Will schedule f/u cbc and iron studies at her next appt.

## 2020-09-13 NOTE — Progress Notes (Signed)
Discussed Slow Fe formulation of iron and discussed trying every other day administration. Appears patient still has diarrhea. Routing to PCP for recommendations

## 2020-09-14 NOTE — Telephone Encounter (Signed)
Patient aware and declined GI referral. Has appt on 11/22.

## 2020-09-23 ENCOUNTER — Telehealth: Payer: Self-pay | Admitting: *Deleted

## 2020-09-23 DIAGNOSIS — D649 Anemia, unspecified: Secondary | ICD-10-CM

## 2020-09-23 DIAGNOSIS — E039 Hypothyroidism, unspecified: Secondary | ICD-10-CM

## 2020-09-23 DIAGNOSIS — E1165 Type 2 diabetes mellitus with hyperglycemia: Secondary | ICD-10-CM

## 2020-09-23 DIAGNOSIS — E7800 Pure hypercholesterolemia, unspecified: Secondary | ICD-10-CM

## 2020-09-23 NOTE — Telephone Encounter (Signed)
Please place future orders for lab appt.  

## 2020-09-23 NOTE — Telephone Encounter (Signed)
Order placed for f/u labs.  

## 2020-09-27 ENCOUNTER — Other Ambulatory Visit: Payer: Self-pay

## 2020-09-27 ENCOUNTER — Other Ambulatory Visit (INDEPENDENT_AMBULATORY_CARE_PROVIDER_SITE_OTHER): Payer: Medicare Other

## 2020-09-27 DIAGNOSIS — E7800 Pure hypercholesterolemia, unspecified: Secondary | ICD-10-CM | POA: Diagnosis not present

## 2020-09-27 DIAGNOSIS — E1165 Type 2 diabetes mellitus with hyperglycemia: Secondary | ICD-10-CM

## 2020-09-27 DIAGNOSIS — D649 Anemia, unspecified: Secondary | ICD-10-CM

## 2020-09-27 LAB — IBC + FERRITIN
Ferritin: 14.2 ng/mL (ref 10.0–291.0)
Iron: 57 ug/dL (ref 42–145)
Saturation Ratios: 13.6 % — ABNORMAL LOW (ref 20.0–50.0)
Transferrin: 299 mg/dL (ref 212.0–360.0)

## 2020-09-27 LAB — CBC WITH DIFFERENTIAL/PLATELET
Basophils Absolute: 0 10*3/uL (ref 0.0–0.1)
Basophils Relative: 0.5 % (ref 0.0–3.0)
Eosinophils Absolute: 0.4 10*3/uL (ref 0.0–0.7)
Eosinophils Relative: 5.3 % — ABNORMAL HIGH (ref 0.0–5.0)
HCT: 41 % (ref 36.0–46.0)
Hemoglobin: 13.7 g/dL (ref 12.0–15.0)
Lymphocytes Relative: 23.7 % (ref 12.0–46.0)
Lymphs Abs: 1.9 10*3/uL (ref 0.7–4.0)
MCHC: 33.3 g/dL (ref 30.0–36.0)
MCV: 85.4 fl (ref 78.0–100.0)
Monocytes Absolute: 0.7 10*3/uL (ref 0.1–1.0)
Monocytes Relative: 8.2 % (ref 3.0–12.0)
Neutro Abs: 5 10*3/uL (ref 1.4–7.7)
Neutrophils Relative %: 62.3 % (ref 43.0–77.0)
Platelets: 372 10*3/uL (ref 150.0–400.0)
RBC: 4.8 Mil/uL (ref 3.87–5.11)
RDW: 14.7 % (ref 11.5–15.5)
WBC: 8 10*3/uL (ref 4.0–10.5)

## 2020-09-27 LAB — HEPATIC FUNCTION PANEL
ALT: 9 U/L (ref 0–35)
AST: 11 U/L (ref 0–37)
Albumin: 4.5 g/dL (ref 3.5–5.2)
Alkaline Phosphatase: 72 U/L (ref 39–117)
Bilirubin, Direct: 0.1 mg/dL (ref 0.0–0.3)
Total Bilirubin: 0.3 mg/dL (ref 0.2–1.2)
Total Protein: 7.4 g/dL (ref 6.0–8.3)

## 2020-09-27 LAB — LIPID PANEL
Cholesterol: 104 mg/dL (ref 0–200)
HDL: 41.9 mg/dL (ref >39.00)
LDL Cholesterol: 44 mg/dL (ref 0–99)
NonHDL: 61.97
Total CHOL/HDL Ratio: 2
Triglycerides: 89 mg/dL (ref 0.0–149.0)
VLDL: 17.8 mg/dL (ref 0.0–40.0)

## 2020-09-27 LAB — BASIC METABOLIC PANEL
BUN: 17 mg/dL (ref 6–23)
CO2: 25 mEq/L (ref 19–32)
Calcium: 9.3 mg/dL (ref 8.4–10.5)
Chloride: 101 mEq/L (ref 96–112)
Creatinine, Ser: 0.88 mg/dL (ref 0.40–1.20)
GFR: 64.49 mL/min (ref >60.00)
Glucose, Bld: 157 mg/dL — ABNORMAL HIGH (ref 70–99)
Potassium: 4.5 mEq/L (ref 3.5–5.1)
Sodium: 136 mEq/L (ref 135–145)

## 2020-09-27 LAB — HEMOGLOBIN A1C: Hgb A1c MFr Bld: 7.5 % — ABNORMAL HIGH (ref 4.6–6.5)

## 2020-10-04 ENCOUNTER — Encounter: Payer: Self-pay | Admitting: Internal Medicine

## 2020-10-04 ENCOUNTER — Other Ambulatory Visit: Payer: Self-pay

## 2020-10-04 ENCOUNTER — Ambulatory Visit (INDEPENDENT_AMBULATORY_CARE_PROVIDER_SITE_OTHER): Payer: Medicare Other | Admitting: Internal Medicine

## 2020-10-04 DIAGNOSIS — E1165 Type 2 diabetes mellitus with hyperglycemia: Secondary | ICD-10-CM | POA: Diagnosis not present

## 2020-10-04 DIAGNOSIS — K219 Gastro-esophageal reflux disease without esophagitis: Secondary | ICD-10-CM

## 2020-10-04 DIAGNOSIS — E7800 Pure hypercholesterolemia, unspecified: Secondary | ICD-10-CM

## 2020-10-04 DIAGNOSIS — I700 Atherosclerosis of aorta: Secondary | ICD-10-CM | POA: Diagnosis not present

## 2020-10-04 DIAGNOSIS — F320 Major depressive disorder, single episode, mild: Secondary | ICD-10-CM

## 2020-10-04 DIAGNOSIS — E039 Hypothyroidism, unspecified: Secondary | ICD-10-CM | POA: Diagnosis not present

## 2020-10-04 DIAGNOSIS — R413 Other amnesia: Secondary | ICD-10-CM

## 2020-10-04 DIAGNOSIS — D649 Anemia, unspecified: Secondary | ICD-10-CM

## 2020-10-04 DIAGNOSIS — F32A Depression, unspecified: Secondary | ICD-10-CM

## 2020-10-04 NOTE — Progress Notes (Signed)
Patient ID: Jody Wang, female   DOB: 10/09/1946, 74 y.o.   MRN: [id number]   Subjective:    Patient ID: Jody Wang, female    DOB: July 02, 1946, 74 y.o.   MRN: [id number]  HPI This visit occurred during the SARS-CoV-2 public health emergency.  Safety protocols were in place, including screening questions prior to the visit, additional usage of staff PPE, and extensive cleaning of exam room while observing appropriate contact time as indicated for disinfecting solutions.  Patient here for a scheduled follow up.  She reports she is doing relatively well.  Stays active. No chest pain or sob reported.  No acid reflux now. No abdominal pain.  Bowels doing better with taking iron q third day.  Discussed labs.  a1c  Improved some from last check - 7.5 down from 7.8.  Still elevated above goal.  States before 2:00 pm blood sugars averaging 130-150.  Sleeps late.  Discussed trulicity.  Tolerating.  Has lost weight.  Currently taking 1.5.  She reports her sons are concerned regarding her memory.  She feels things are stable.  Answering questions appropriately today.    Past Medical History:  Diagnosis Date  . Depression   . Diabetes mellitus without complication ([place])   . GERD (gastroesophageal reflux disease)   . Hypercholesterolemia   . Hypothyroidism   . Osteoarthritis of knee   . Wears dentures    partial lower   Past Surgical History:  Procedure Laterality Date  . ABDOMINAL HYSTERECTOMY  1985   ovaries not removed  . BREAST CYST EXCISION Right   . BREAST CYST EXCISION Left   . CATARACT EXTRACTION W/PHACO Right 02/16/2016   Procedure: CATARACT EXTRACTION PHACO AND INTRAOCULAR LENS PLACEMENT ([place]) RIGHT ;  Surgeon: Leandrew Koyanagi, MD;  Location: [facility];  Service: Ophthalmology;  Laterality: Right;  DIABETIC - oral meds  . CATARACT EXTRACTION W/PHACO Left 03/15/2016   Procedure: CATARACT EXTRACTION PHACO AND INTRAOCULAR LENS PLACEMENT ([facility])left eye;  Surgeon:  Leandrew Koyanagi, MD;  Location: [facility];  Service: Ophthalmology;  Laterality: Left;  DIABETIC - oral meds  . INNER EAR SURGERY     Family History  Problem Relation Age of Onset  . Diabetes Father   . Coronary artery disease Father        s/p CABG  . Hypertension Father   . Hypercholesterolemia Father   . Hypertension Brother   . Diabetes Brother   . Hypertension Sister   . Glaucoma Sister   . Diabetes Other        niece  . Breast cancer Maternal Aunt   . Breast cancer Paternal Aunt   . Cervical cancer Paternal Aunt   . Breast cancer Cousin   . Breast cancer Other    Social History   Socioeconomic History  . Marital status: Widowed    Spouse name: Not on file  . Number of children: 3  . Years of education: Not on file  . Highest education level: Not on file  Occupational History  . Not on file  Tobacco Use  . Smoking status: Former Smoker    Packs/day: 1.00    Years: 23.00    Pack years: 23.00    Quit date: 11/20/1983    Years since quitting: 36.9  . Smokeless tobacco: Never Used  Vaping Use  . Vaping Use: Never used  Substance and Sexual Activity  . Alcohol use: Yes    Alcohol/week: 0.0 standard drinks    Comment: rare  .  Drug use: No  . Sexual activity: Never  Other Topics Concern  . Not on file  Social History Narrative  . Not on file   Social Determinants of Health   Financial Resource Strain: Medium Risk  . Difficulty of Paying Living Expenses: Somewhat hard  Food Insecurity:   . Worried About Charity fundraiser in the Last Year: Not on file  . Ran Out of Food in the Last Year: Not on file  Transportation Needs:   . Lack of Transportation (Medical): Not on file  . Lack of Transportation (Non-Medical): Not on file  Physical Activity:   . Days of Exercise per Week: Not on file  . Minutes of Exercise per Session: Not on file  Stress:   . Feeling of Stress : Not on file  Social Connections:   . Frequency of Communication with  Friends and Family: Not on file  . Frequency of Social Gatherings with Friends and Family: Not on file  . Attends Religious Services: Not on file  . Active Member of Clubs or Organizations: Not on file  . Attends Archivist Meetings: Not on file  . Marital Status: Not on file    Outpatient Encounter Medications as of 10/04/2020  Medication Sig  . aspirin 81 MG chewable tablet Chew 81 mg by mouth daily.  . blood glucose meter kit and supplies KIT Dispense based on patient and insurance preference. Use to check blood sugars twice daily. DX E11.9  . Blood Glucose Monitoring Suppl (ONETOUCH VERIO) w/Device KIT 1 Device by Does not apply route daily.  . cholecalciferol (VITAMIN D) 1000 units tablet Take 1,000 Units by mouth daily.  . cyanocobalamin 1000 MCG tablet Take 1,000 mcg by mouth daily.  . diclofenac (VOLTAREN) 50 MG EC tablet TAKE 1 TABLET TWICE DAILY AS NEEDED  . Dulaglutide (TRULICITY) 1.5 HW/2.9HB SOPN Inject 1.5 mg into the skin once a week.  . ferrous sulfate 325 (65 FE) MG tablet Take 1 tablet (325 mg total) by mouth daily with breakfast. (Patient not taking: Reported on 08/30/2020)  . FLUoxetine (PROZAC) 20 MG capsule Take 1 capsule (20 mg total) by mouth daily.  . fluticasone (FLONASE) 50 MCG/ACT nasal spray Place 2 sprays into both nostrils daily.  Marland Kitchen glucose blood (ONETOUCH VERIO) test strip TEST BLOOD SUGAR TWICE DAILY AS DIRECTED  . glucose blood test strip Use as instructed to check blood sugars to check blood sugars twice daily. Dx E11.9  . Lancets (ONETOUCH ULTRASOFT) lancets TEST BLOOD SUGAR TWICE DAILY  . levocetirizine (XYZAL) 5 MG tablet TAKE 1 TABLET EVERY DAY  . levothyroxine (SYNTHROID) 88 MCG tablet TAKE 1 TABLET EVERY DAY  . metFORMIN (GLUCOPHAGE-XR) 500 MG 24 hr tablet TAKE 1 TABLET TWICE DAILY  . ONETOUCH VERIO test strip USE AS INSTRUCTED TO CHECK BLOOD SUGARS TWICE DAILY.   . pantoprazole (PROTONIX) 40 MG tablet On tab po daily  . rosuvastatin  (CRESTOR) 40 MG tablet TAKE 1 TABLET EVERY DAY  . traZODone (DESYREL) 50 MG tablet Take 1 tablet (50 mg total) by mouth daily.   No facility-administered encounter medications on file as of 10/04/2020.    Review of Systems  Constitutional: Negative for appetite change and unexpected weight change.  HENT: Negative for congestion and sinus pressure.   Respiratory: Negative for cough, chest tightness and shortness of breath.   Cardiovascular: Negative for chest pain, palpitations and leg swelling.  Gastrointestinal: Negative for abdominal pain, diarrhea, nausea and vomiting.  Genitourinary: Negative for difficulty  urinating and dysuria.  Musculoskeletal: Negative for joint swelling and myalgias.  Skin: Negative for color change and rash.  Neurological: Negative for dizziness, light-headedness and headaches.  Psychiatric/Behavioral: Negative for agitation and dysphoric mood.       Objective:    Physical Exam Vitals reviewed.  Constitutional:      General: She is not in acute distress.    Appearance: Normal appearance.  HENT:     Head: Normocephalic and atraumatic.     Right Ear: External ear normal.     Left Ear: External ear normal.  Eyes:     General: No scleral icterus.       Right eye: No discharge.        Left eye: No discharge.     Conjunctiva/sclera: Conjunctivae normal.  Neck:     Thyroid: No thyromegaly.  Cardiovascular:     Rate and Rhythm: Normal rate and regular rhythm.  Pulmonary:     Effort: No respiratory distress.     Breath sounds: Normal breath sounds. No wheezing.  Abdominal:     General: Bowel sounds are normal.     Palpations: Abdomen is soft.     Tenderness: There is no abdominal tenderness.  Musculoskeletal:        General: No swelling or tenderness.     Cervical back: Neck supple. No tenderness.  Lymphadenopathy:     Cervical: No cervical adenopathy.  Skin:    Findings: No erythema or rash.  Neurological:     Mental Status: She is alert.    Psychiatric:        Mood and Affect: Mood normal.        Behavior: Behavior normal.     BP 120/70   Pulse 90   Temp 98.1 F (36.7 C) (Oral)   Resp 16   Ht 5' 1"  (1.549 m)   Wt 138 lb 6.4 oz (62.8 kg)   SpO2 99%   BMI 26.15 kg/m  Wt Readings from Last 3 Encounters:  10/04/20 138 lb 6.4 oz (62.8 kg)  06/01/20 141 lb 3.2 oz (64 kg)  02/17/20 136 lb (61.7 kg)     Lab Results  Component Value Date   WBC 8.0 09/27/2020   HGB 13.7 09/27/2020   HCT 41.0 09/27/2020   PLT 372.0 09/27/2020   GLUCOSE 157 (H) 09/27/2020   CHOL 104 09/27/2020   TRIG 89.0 09/27/2020   HDL 41.90 09/27/2020   LDLDIRECT 188.8 06/30/2013   LDLCALC 44 09/27/2020   ALT 9 09/27/2020   AST 11 09/27/2020   NA 136 09/27/2020   K 4.5 09/27/2020   CL 101 09/27/2020   CREATININE 0.88 09/27/2020   BUN 17 09/27/2020   CO2 25 09/27/2020   TSH 3.25 02/12/2020   HGBA1C 7.5 (H) 09/27/2020   MICROALBUR <0.7 02/12/2020    MM 3D SCREEN BREAST BILATERAL  Result Date: 10/06/2019 CLINICAL DATA:  Screening. EXAM: DIGITAL SCREENING BILATERAL MAMMOGRAM WITH TOMO AND CAD COMPARISON:  Previous exam(s). ACR Breast Density Category c: The breast tissue is heterogeneously dense, which may obscure small masses. FINDINGS: There are no findings suspicious for malignancy. Images were processed with CAD. IMPRESSION: No mammographic evidence of malignancy. A result letter of this screening mammogram will be mailed directly to the patient. RECOMMENDATION: Screening mammogram in one year. (Code:SM-B-01Y) BI-RADS CATEGORY  1: Negative. Electronically Signed   By: Lillia Mountain M.D.   On: 10/06/2019 16:41       Assessment & Plan:   Problem List Items  Addressed This Visit    Type 2 diabetes mellitus with hyperglycemia ([place])    On trulicity 1.5.  Sugars as outlined.  a1c still elevated above goal.  Discussed diet and exercise and eating regular meals.  Increase trulicity to 25m.  Notify Catie.  Follow met b and a1c.       Relevant  Orders   Hemoglobin AS2V  Basic metabolic panel   Mild depression (HCC)    Overall doing well.  On prozac. Follow.       Memory change    Discussed.  She feels she is doing well.  MMS.  Follow.        Hypothyroidism    On thyroid replacement.  Follow tsh.       Hypercholesterolemia    On crestor.  Low cholesterol diet and exercise.  Follow lipid panel and liver function tests.        Relevant Orders   Hepatic function panel   Lipid panel   GERD (gastroesophageal reflux disease)    Upper symptoms controlled.  On protonix.        Aortic atherosclerosis (HCC)    Continue crestor.       Anemia    With IDA.  Tolerating iron q third day.  Have discussed GI evaluation.  cologuard 02/24/19 - negative.  She declines any further GI w/up.  Recent hgb wnl.        Relevant Orders   CBC with Differential/Platelet   IBC + Ferritin       CEinar Pheasant MD

## 2020-10-06 ENCOUNTER — Telehealth: Payer: Self-pay

## 2020-10-06 NOTE — Chronic Care Management (AMB) (Signed)
  Care Management   Note  10/06/2020 Name: [name] MRN: [id number] DOB: 1945/11/29  Jody Wang is a 74 y.o. year old female who is a primary care patient of Einar Pheasant, MD and is actively engaged with the care management team. I reached out to Tonia Ghent by phone today to assist with re-scheduling a follow up visit with the Pharmacist  Follow up plan: Unsuccessful telephone outreach attempt made. A HIPAA compliant phone message was left for the patient providing contact information and requesting a return call.  The care management team will reach out to the patient again over the next 2 days.  If patient returns call to provider office, please advise to call [facility]  at [place], [place], [place], [place] [address] Direct Dial: [phone] [email or web address]

## 2020-10-11 ENCOUNTER — Encounter: Payer: Self-pay | Admitting: Internal Medicine

## 2020-10-11 ENCOUNTER — Ambulatory Visit: Payer: Medicare Other | Admitting: Pharmacist

## 2020-10-11 ENCOUNTER — Telehealth: Payer: Self-pay | Admitting: Internal Medicine

## 2020-10-11 DIAGNOSIS — R918 Other nonspecific abnormal finding of lung field: Secondary | ICD-10-CM

## 2020-10-11 DIAGNOSIS — E1165 Type 2 diabetes mellitus with hyperglycemia: Secondary | ICD-10-CM

## 2020-10-11 DIAGNOSIS — I700 Atherosclerosis of aorta: Secondary | ICD-10-CM

## 2020-10-11 DIAGNOSIS — R413 Other amnesia: Secondary | ICD-10-CM | POA: Insufficient documentation

## 2020-10-11 DIAGNOSIS — R9389 Abnormal findings on diagnostic imaging of other specified body structures: Secondary | ICD-10-CM

## 2020-10-11 DIAGNOSIS — F320 Major depressive disorder, single episode, mild: Secondary | ICD-10-CM

## 2020-10-11 DIAGNOSIS — E7800 Pure hypercholesterolemia, unspecified: Secondary | ICD-10-CM

## 2020-10-11 DIAGNOSIS — F32A Depression, unspecified: Secondary | ICD-10-CM

## 2020-10-11 NOTE — Assessment & Plan Note (Addendum)
With IDA.  Tolerating iron q third day.  Have discussed GI evaluation.  cologuard 02/24/19 - negative.  She declines any further GI w/up.  Recent hgb wnl.

## 2020-10-11 NOTE — Telephone Encounter (Signed)
Pt called back and wanted to know why Dr.Scott wanted her to have the CT done and she will do it if insurance covers it 100%

## 2020-10-11 NOTE — Assessment & Plan Note (Signed)
Discussed.  She feels she is doing well.  MMS.  Follow.

## 2020-10-11 NOTE — Assessment & Plan Note (Signed)
On crestor.  Low cholesterol diet and exercise.  Follow lipid panel and liver function tests.   

## 2020-10-11 NOTE — Telephone Encounter (Signed)
Called and spoke to [place]. She is not agreeable to the CT scan and declines.

## 2020-10-11 NOTE — Telephone Encounter (Signed)
Trulicity 3 mg weekly script called into [employer].

## 2020-10-11 NOTE — Assessment & Plan Note (Signed)
Upper symptoms controlled.  On protonix.  

## 2020-10-11 NOTE — Assessment & Plan Note (Signed)
On trulicity 1.5.  Sugars as outlined.  a1c still elevated above goal.  Discussed diet and exercise and eating regular meals.  Increase trulicity to 12m.  Notify Catie.  Follow met b and a1c.

## 2020-10-11 NOTE — Chronic Care Management (AMB) (Signed)
Chronic Care Management   Pharmacy Note  10/11/2020 Name: Jody Wang MRN: [id number] DOB: 07/11/1946   Subjective:  Jody Wang is a 74 y.o. year old female who is a primary care patient of Einar Pheasant, MD. The CCM team was consulted for assistance with chronic disease management and care coordination needs.    Care coordination completed today in response to provider referral for pharmacy case management and/or care coordination services.   Consent to Services:  _0 @ _1 @ was given information about Chronic Care Management services, agreed to services, and gave verbal consent prior to initiation of services on 07/06/20. Please see initial visit note for detailed documentation.   SDOH (Social Determinants of Health) assessments and interventions performed:  SDOH Interventions     Most Recent Value  SDOH Interventions  Financial Strain Interventions Other (Comment)  [manufacturer assistance]       Objective:  Lab Results  Component Value Date   CREATININE 0.88 09/27/2020   CREATININE 0.86 05/27/2020   CREATININE 0.86 02/12/2020    Lab Results  Component Value Date   HGBA1C 7.5 (H) 09/27/2020       Component Value Date/Time   CHOL 104 09/27/2020 1101   TRIG 89.0 09/27/2020 1101   HDL 41.90 09/27/2020 1101   CHOLHDL 2 09/27/2020 1101   VLDL 17.8 09/27/2020 1101   LDLCALC 44 09/27/2020 1101   LDLDIRECT 188.8 06/30/2013 1131     BP Readings from Last 3 Encounters:  10/04/20 120/70  06/01/20 118/70  02/17/20 124/76    Assessment/Interventions: Review of patient past medical history, allergies, medications, health status, including review of consultants reports, laboratory and other test data, was performed as part of comprehensive evaluation and provision of chronic care management services.   Allergies  Allergen Reactions  . Codeine Shortness Of Breath    stops breathing  . Meperidine And Related Shortness Of Breath    Stops breathing  .  Oxycontin [Oxycodone Hcl] Shortness Of Breath    Stops breathing  . Jardiance [Empagliflozin] Other (See Comments)    Yeast infections  . Tape Rash    Paper tape ok    Medications Reviewed Today    Reviewed by Einar Pheasant, MD (Physician) on 10/11/20 at [place] [facility] List Status: <None>  Medication Order Taking? Sig Documenting Provider Last Dose Status Informant  aspirin 81 MG chewable tablet [id number] No Chew 81 mg by mouth daily. [provider] Taking Active   blood glucose meter kit and supplies KIT [id number] No Dispense based on patient and insurance preference. Use to check blood sugars twice daily. DX E11.9 Einar Pheasant, MD Taking Active   Blood Glucose Monitoring Suppl [facility] VERIO) w/Device KIT [id number] No 1 Device by Does not apply route daily. Einar Pheasant, MD Taking Active   cholecalciferol (VITAMIN D) 1000 units tablet [id number] No Take 1,000 Units by mouth daily. [provider] Taking Active Self  cyanocobalamin 1000 MCG tablet [id number] No Take 1,000 mcg by mouth daily. [provider] Taking Active Self  diclofenac (VOLTAREN) 50 MG EC tablet [id number] No TAKE 1 TABLET TWICE DAILY AS NEEDED Crecencio Mc, MD Taking Active   Dulaglutide (TRULICITY) 1.5 CH/8.8FO SOPN [id number]  Inject 1.5 mg into the skin once a week. Einar Pheasant, MD  Active   ferrous sulfate 325 (65 FE) MG tablet [id number] No Take 1 tablet (325 mg total) by mouth daily with breakfast.  Patient not taking: Reported on 08/30/2020   Einar Pheasant, MD Not Taking Active  FLUoxetine (PROZAC) 20 MG capsule [id number] No Take 1 capsule (20 mg total) by mouth daily. Einar Pheasant, MD Taking Active   fluticasone [facility]) 50 MCG/ACT nasal spray [id number] No Place 2 sprays into both nostrils daily. Einar Pheasant, MD Taking Active   glucose blood [facility] VERIO) test strip [id number] No TEST BLOOD SUGAR TWICE DAILY AS DIRECTED Einar Pheasant, MD Taking Active   glucose  blood test strip [id number] No Use as instructed to check blood sugars to check blood sugars twice daily. Dx E11.9 Einar Pheasant, MD Taking Active   Lancets [facility] ULTRASOFT) lancets [id number] No TEST BLOOD SUGAR TWICE DAILY Einar Pheasant, MD Taking Active   levocetirizine (XYZAL) 5 MG tablet [id number] No TAKE 1 TABLET EVERY DAY Einar Pheasant, MD Taking Active   levothyroxine (SYNTHROID) 88 MCG tablet [id number] No TAKE 1 TABLET EVERY DAY Einar Pheasant, MD Taking Active   metFORMIN (GLUCOPHAGE-XR) 500 MG 24 hr tablet [id number] No TAKE 1 TABLET TWICE DAILY Einar Pheasant, MD Taking Active   [facility] VERIO test strip [id number] No USE AS INSTRUCTED TO CHECK BLOOD SUGARS TWICE DAILY.  Einar Pheasant, MD Taking Active   pantoprazole (PROTONIX) 40 MG tablet [id number] No On tab po daily Einar Pheasant, MD Taking Active   rosuvastatin (CRESTOR) 40 MG tablet [id number] No TAKE 1 TABLET EVERY DAY Einar Pheasant, MD Taking Active   traZODone (DESYREL) 50 MG tablet [id number] No Take 1 tablet (50 mg total) by mouth daily. Einar Pheasant, MD Taking Active           Patient Active Problem List   Diagnosis Date Noted  . Memory change 10/11/2020  . Carotid bruit 06/06/2020  . Anemia 02/22/2020  . Cough 01/19/2020  . Thrombocytosis 04/14/2018  . Lung nodule, multiple 03/14/2018  . AK (actinic keratosis) 03/14/2018  . Aortic atherosclerosis ([place]) 05/14/2017  . Coronary artery disease of native artery of native heart with stable angina pectoris ([place]) 05/14/2017  . Abnormal CT of the chest 02/11/2017  . SOB (shortness of breath) on exertion 02/01/2017  . Chest pain 01/28/2017  . Primary osteoarthritis of right knee 08/29/2016  . Shingles 07/02/2016  . Knee pain 07/11/2015  . Health care maintenance 02/27/2015  . Carotid stenosis 02/27/2015  . Cerumen impaction 06/21/2014  . Weight loss 04/12/2014  . Environmental allergies 04/12/2014  . Right shoulder pain 10/08/2013  . GERD  (gastroesophageal reflux disease) 10/09/2012  . Mild depression ([place]) 10/08/2012  . Hypercholesterolemia 10/08/2012  . Hypothyroidism 10/08/2012  . Type 2 diabetes mellitus with hyperglycemia ([place]) 10/08/2012    Medication Assistance: Application for [employer] medication assistance program in process. Anticipated assistance start date TBD. See plan of care below for additional detail.   Patient Care Plan: Medication Management    Problem Identified: Diabetes, HLD     Long-Range Goal: Disease Progression Prevention   This Visit's Progress: On track  Priority: High  Note:   Current Barriers:  . Unable to independently afford treatment regimen . Unable to achieve control of diabetes   Pharmacist Clinical Goal(s):  Marland Kitchen Over the next 90 days, patient will verbalize ability to afford treatment regimen. . Over the next 90 days, patient will achieve control of diabetes as evidenced by improvement in A1cthrough collaboration with PharmD and provider.   Interventions: . Inter-disciplinary care team collaboration (see longitudinal plan of care) . Comprehensive medication review performed; medication list updated in electronic medical record  Diabetes: . Uncontrolled; current treatment: Trulicity 1.5 mg weekly, metformin XR 500 mg BID (dose limited by GI  upset)  . Discussed w/ PCP that dietary indiscretions likely relating to glucose elevations. Decided to increase Trulicity to 3 mg. Patient receives from [employer]. [employer], provided verbal script under Dr. Nicki Reaper for Trulicity 3 mg weekly.  . Will outreach patient as previously scheduled for re-enrollment  Hyperlipidemia and ASCVD risk reduction . Controlled; current treatment: rosuvastatin 40 mg daily;  . Antiplatelet therapy: aspirin 81 mg daily  . Recommended to continue current treatment regimen.  Depression with Insomnia: . Controlled; current treatment: fluoxetine 20 mg daily, trazodone 50 mg QPM.  . Recommended to continue  current regimen at this time  Hypothyroidism: . Controlled; current treatment regimen: levothyroxine 88 mcg daily.  . Recommended to continue current treatment regimen at this time  GERD: . Controlled; current treatment: pantoprazole 40 mg daily . Recommended to continue current treatment regimen at this time  Osteoarthritis: . Moderately well controlled; diclofenac 50 mg BID PRN - taking PRN  . Recommended to continue current regimen at this time  Supplement: Marland Kitchen Vitamin D, Vitamin B12, ferrous sulfate 325 mg every 3 days  Patient Goals/Self-Care Activities . Over the next 90 days, patient will:  - take medications as prescribed check blood glucose daily, document, and provide at future appointments  Follow Up Plan: Telephone follow up appointment with care management team member scheduled for: ~ 2 weeks       Plan: Telephone follow up appointment with care management team member scheduled for: ~ 2 weeks  Catie Darnelle Maffucci, PharmD, [place], [place] Pharmacist [place] [phone]

## 2020-10-11 NOTE — Telephone Encounter (Signed)
I have discussed with pt regarding f/u chest CT.  She had informed me previously that she would think about scheduling after May 2021.  Needs f/u chest CT. If agreeable, let me know and I will place the order.

## 2020-10-11 NOTE — Patient Instructions (Addendum)
Visit Information  Patient Care Plan: Medication Management    Problem Identified: Diabetes, HLD     Long-Range Goal: Disease Progression Prevention   This Visit's Progress: On track  Priority: High  Note:   Current Barriers:  . Unable to independently afford treatment regimen . Unable to achieve control of diabetes   Pharmacist Clinical Goal(s):  Marland Kitchen Over the next 90 days, patient will verbalize ability to afford treatment regimen. . Over the next 90 days, patient will achieve control of diabetes as evidenced by improvement in A1cthrough collaboration with PharmD and provider.   Interventions: . Inter-disciplinary care team collaboration (see longitudinal plan of care) . Comprehensive medication review performed; medication list updated in electronic medical record  Diabetes: . Uncontrolled; current treatment: Trulicity 1.5 mg weekly, metformin XR 500 mg BID (dose limited by GI upset)  . Discussed w/ PCP that dietary indiscretions likely relating to glucose elevations. Decided to increase Trulicity to 3 mg. Patient receives from [employer]. [employer], provided verbal script under Dr. Nicki Reaper for Trulicity 3 mg weekly.  . Will outreach patient as previously scheduled for re-enrollment  Hyperlipidemia and ASCVD risk reduction . Controlled; current treatment: rosuvastatin 40 mg daily;  . Antiplatelet therapy: aspirin 81 mg daily  . Recommended to continue current treatment regimen.  Depression with Insomnia: . Controlled; current treatment: fluoxetine 20 mg daily, trazodone 50 mg QPM.  . Recommended to continue current regimen at this time  Hypothyroidism: . Controlled; current treatment regimen: levothyroxine 88 mcg daily.  . Recommended to continue current treatment regimen at this time  GERD: . Controlled; current treatment: pantoprazole 40 mg daily . Recommended to continue current treatment regimen at this time  Osteoarthritis: . Moderately well controlled;  diclofenac 50 mg BID PRN - taking PRN  . Recommended to continue current regimen at this time  Supplement: Marland Kitchen Vitamin D, Vitamin B12, ferrous sulfate 325 mg every 3 days  Patient Goals/Self-Care Activities . Over the next 90 days, patient will:  - take medications as prescribed check blood glucose daily, document, and provide at future appointments  Follow Up Plan: Telephone follow up appointment with care management team member scheduled for: ~ 2 weeks      The patient verbalized understanding of instructions, educational materials, and care plan provided today and agreed to receive a mailed copy of patient instructions, educational materials, and care plan.    Plan: Telephone follow up appointment with care management team member scheduled for: ~ 2 weeks  Catie Darnelle Maffucci, PharmD, [place], [place] Pharmacist Rosita Dimm[place] [phone]

## 2020-10-11 NOTE — Assessment & Plan Note (Signed)
Overall doing well.  On prozac. Follow.

## 2020-10-11 NOTE — Assessment & Plan Note (Signed)
On thyroid replacement.  Follow tsh.  

## 2020-10-11 NOTE — Assessment & Plan Note (Signed)
Continue crestor 

## 2020-10-11 NOTE — Telephone Encounter (Signed)
Ms Heatley sees you.  I recently increased trulicity to 3mg .  She gets pt assistance.  You told me just to message you and let you know.  Let me know what I need to do.  Thanks.

## 2020-10-12 ENCOUNTER — Telehealth: Payer: Medicare Other

## 2020-10-12 NOTE — Telephone Encounter (Signed)
She had a previous CT chest that recommended f/u CT scan.  She saw Dr Genevive Bi previously (cardiothoracic specialist).  CT has been scheduled on previous occasions and she has not followed through with getting the scan.  Have discussed with her and she previously declined due to cost.  Recent visit, had informed me she would get CT after 03/2020.  Was following up to see if she would agree for CT scan.  I am not sure how much insurance will cover.  She will have to call her insurance.

## 2020-10-12 NOTE — Chronic Care Management (AMB) (Signed)
  Care Management   Note  10/12/2020 Name: Jody Wang MRN: [id number] DOB: Apr 25, 1946  Jody Wang is a 74 y.o. year old female who is a primary care patient of Einar Pheasant, MD and is actively engaged with the care management team. I reached out to Tonia Ghent by phone today to assist with re-scheduling a follow up visit with the Pharmacist Licensed Clinical Social Worker  Follow up plan: Telephone appointment with care management team member scheduled for:10/21/2020  Noreene Larsson, [place], [place] [facility]  [place], [place] [address] Direct Dial: [phone] [email or web address]: [email or web address]

## 2020-10-12 NOTE — Telephone Encounter (Signed)
It looks like per last scan- recommended 3-6 month repeat scan to follow scattered nodules. Just wanted to confirm correct prior to calling her?

## 2020-10-13 ENCOUNTER — Other Ambulatory Visit: Payer: Self-pay

## 2020-10-13 ENCOUNTER — Ambulatory Visit
Admission: RE | Admit: 2020-10-13 | Discharge: 2020-10-13 | Disposition: A | Discharge status: Home / Self Care | Payer: Medicare Other | Source: Ambulatory Visit | Attending: Internal Medicine | Admitting: Internal Medicine

## 2020-10-13 DIAGNOSIS — Z1231 Encounter for screening mammogram for malignant neoplasm of breast: Secondary | ICD-10-CM

## 2020-10-13 NOTE — Telephone Encounter (Signed)
LMTCB

## 2020-10-15 ENCOUNTER — Other Ambulatory Visit: Payer: Self-pay

## 2020-10-15 MED ORDER — TRAZODONE HCL 50 MG PO TABS
50.0000 mg | ORAL_TABLET | Freq: Every day | ORAL | 1 refills | Status: DC
Start: 2020-10-15 — End: 2021-01-19

## 2020-10-21 ENCOUNTER — Ambulatory Visit: Payer: Medicare Other | Admitting: Pharmacist

## 2020-10-21 DIAGNOSIS — E039 Hypothyroidism, unspecified: Secondary | ICD-10-CM

## 2020-10-21 DIAGNOSIS — E1165 Type 2 diabetes mellitus with hyperglycemia: Secondary | ICD-10-CM

## 2020-10-21 DIAGNOSIS — E7800 Pure hypercholesterolemia, unspecified: Secondary | ICD-10-CM

## 2020-10-21 DIAGNOSIS — I700 Atherosclerosis of aorta: Secondary | ICD-10-CM

## 2020-10-21 NOTE — Telephone Encounter (Signed)
Noted! Thank you

## 2020-10-21 NOTE — Patient Instructions (Addendum)
Ms. Mctier,   It was great talking to you today!  Keep up taking metformin 2 tabs daily and Trulicity 3 mg weekly. I called [employer] and they are going to ship out the 3 mg pen strength to you next  Wednesday, December 22nd.   Please try and check your blood sugar daily, alternating between fasting (before breakfast) and about 2 hours after meals.   Please call your insurance to check on the cost of the CT scan of your chest. Hopefully, our Cares Guides have already called you to help navigate the financial burden.   Call me with any questions or concerns!  Catie Darnelle Maffucci, PharmD [phone]  Visit Information  Patient Care Plan: Medication Management    Problem Identified: Diabetes, HLD     Long-Range Goal: Disease Progression Prevention   This Visit's Progress: On track  Recent Progress: On track  Priority: High  Note:   Current Barriers:  . Unable to independently afford treatment regimen . Unable to achieve control of diabetes   Pharmacist Clinical Goal(s):  Marland Kitchen Over the next 90 days, patient will verbalize ability to afford treatment regimen. . Over the next 90 days, patient will achieve control of diabetes as evidenced by improvement in A1cthrough collaboration with PharmD and provider.   Interventions: . Inter-disciplinary care team collaboration (see longitudinal plan of care) . Comprehensive medication review performed; medication list updated in electronic medical record  Health Maintenance: . Discussed vaccines. Patient has received yearly influenza vaccine and COVID booster. Called [facility] to verify dates and added to her chart.  . Due for repeat CT scan to f/u on pulmonary nodules seen on last CT scan in 2018. Patient unwilling to schedule due to cost. Discussed support from Care Guides to evaluate community resources to either assist with cost of CT scan, or alleviate other financial burdens to free up funds for CT scan. Patient amenable for this referral and CT  order. Will communicate this to PCP.   Diabetes: . Uncontrolled; current treatment: Trulicity 3 mg weekly (recently increased, using two of the 1.5 mg pens), metformin XR 500 mg BID (dose limited by GI upset)  . Current glucose readings: notes she is not checking often. Reports fasting 126 yesterday morning.  . Denies any significant GI upset after increasing Trulicity dose. Has not received 3 mg pen strength that I previously called in.  . Continue current regimen at this time.  [place]; requested that they ship 3 mg dose.  . Encouraged she increase home BG checks to daily, alternating between fasting and 2 hour post prandial   Hyperlipidemia and ASCVD risk reduction . Controlled; current treatment: rosuvastatin 40 mg daily;  . Antiplatelet therapy: aspirin 81 mg daily (appropriate given CAD dx) . Recommended to continue current treatment regimen.  Depression with Insomnia: . Controlled; current treatment: fluoxetine 20 mg daily, trazodone 50 mg QPM.  . Patient reports mood and sleep are well controlled at this time.  . Recommended to continue current regimen at this time  Hypothyroidism: . Controlled per last TSH; current treatment regimen: levothyroxine 88 mcg daily.  . Recommended to continue current treatment regimen at this time  GERD: . Controlled; current treatment: pantoprazole 40 mg daily . Recommended to continue current treatment regimen at this time  Osteoarthritis: . Moderately well controlled; diclofenac 50 mg BID PRN - taking PRN, though usually at least 1 dose daily . Recommended to continue current regimen at this time with renal function monioring  Supplement: Marland Kitchen Vitamin  D, Vitamin B12, ferrous sulfate 325 mg every other day - reports she is tolerating this well at this time  Patient Goals/Self-Care Activities . Over the next 90 days, patient will:  - take medications as prescribed check glucose daily, document, and provide at future  appointments collaborate with provider on medication access solutions  Follow Up Plan: Telephone follow up appointment with care management team member scheduled for: ~ 6 weeks       The patient verbalized understanding of instructions, educational materials, and care plan provided today and agreed to receive a mailed copy of patient instructions, educational materials, and care plan.   Plan: Telephone follow up appointment with care management team member scheduled for: ~ 6 weeks  Catie Darnelle Maffucci, PharmD, [place], [place] Clinical Pharmacist [employer] at [employer] [phone]

## 2020-10-21 NOTE — Telephone Encounter (Signed)
Order placed for f/u chest CT

## 2020-10-21 NOTE — Telephone Encounter (Signed)
Reviewed the below with patient during CCM visit. She is concerned about the costs due to other financial burdens right now.   Placed a Care Guide referral to see if patient can be connected with community/financial resources.   Can go ahead and place order for chest CT

## 2020-10-21 NOTE — Addendum Note (Signed)
Addended by: Alisa Graff on: 10/21/2020 09:45 PM   Modules accepted: Orders

## 2020-10-21 NOTE — Chronic Care Management (AMB) (Signed)
Chronic Care Management   Pharmacy Note  10/21/2020 Name: Jody Wang MRN: [id number] DOB: [birth year]  Subjective:  Jody Wang is a 74 y.o. year old female who is a primary care patient of Jody Pheasant, MD. The CCM team was consulted for assistance with chronic disease management and care coordination needs.    Engaged with patient by telephone for follow up visit in response to provider referral for pharmacy case management and/or care coordination services.   Consent to Services:  [name] was given information about Chronic Care Management services, agreed to services, and gave verbal consent prior to initiation of services on 07/06/20. Please see initial visit note for detailed documentation.   Objective:  Lab Results  Component Value Date   CREATININE 0.88 09/27/2020   CREATININE 0.86 05/27/2020   CREATININE 0.86 02/12/2020    Lab Results  Component Value Date   HGBA1C 7.5 (H) 09/27/2020   Lab Results  Component Value Date   TSH 3.25 02/12/2020       Component Value Date/Time   CHOL 104 09/27/2020 1101   TRIG 89.0 09/27/2020 1101   HDL 41.90 09/27/2020 1101   CHOLHDL 2 09/27/2020 1101   VLDL 17.8 09/27/2020 1101   LDLCALC 44 09/27/2020 1101   LDLDIRECT 188.8 06/30/2013 1131    BP Readings from Last 3 Encounters:  10/04/20 120/70  06/01/20 118/70  02/17/20 124/76    Assessment/Interventions: Review of patient past medical history, allergies, medications, health status, including review of consultants reports, laboratory and other test data, was performed as part of comprehensive evaluation and provision of chronic care management services.   SDOH (Social Determinants of Health) assessments and interventions performed:  SDOH Interventions   Flowsheet Row Most Recent Value  SDOH Interventions   Financial Strain Interventions Other (Comment)  [manufacturer assistance]       CCM Care Plan  Allergies  Allergen Reactions  . Codeine  Shortness Of Breath    stops breathing  . Meperidine And Related Shortness Of Breath    Stops breathing  . Oxycontin [Oxycodone Hcl] Shortness Of Breath    Stops breathing  . Jardiance [Empagliflozin] Other (See Comments)    Yeast infections  . Tape Rash    Paper tape ok    Medications Reviewed Today    Reviewed by De Hollingshead, RPH-CPP (Pharmacist) on 10/21/20 at 1301  Med List Status: <None>  Medication Order Taking? Sig Documenting Provider Last Dose Status Informant  aspirin 81 MG chewable tablet [id number] Yes Chew 81 mg by mouth daily. [provider] Taking Active   blood glucose meter kit and supplies KIT [id number] Yes Dispense based on patient and insurance preference. Use to check blood sugars twice daily. DX E11.9 Jody Pheasant, MD Taking Active   Blood Glucose Monitoring Suppl [facility] VERIO) w/Device KIT [id number] Yes 1 Device by Does not apply route daily. Jody Pheasant, MD Taking Active   cholecalciferol (VITAMIN D) 1000 units tablet [id number] Yes Take 1,000 Units by mouth daily. [provider] Taking Active Self  cyanocobalamin 1000 MCG tablet [id number] Yes Take 1,000 mcg by mouth daily. [provider] Taking Active Self  diclofenac (VOLTAREN) 50 MG EC tablet [id number] Yes TAKE 1 TABLET TWICE DAILY AS NEEDED Crecencio Mc, MD Taking Active            Med Note Nat Christen Oct 21, 2020 12:59 PM) Taking once daily  Dulaglutide (TRULICITY) 3 XO/3.2NV SOPN [id number] Yes Inject into the skin.  [provider] Taking Active   ferrous sulfate 325 (65 FE) MG tablet [id number] Yes Take 1 tablet (325 mg total) by mouth daily with breakfast. Jody Pheasant, MD Taking Active            Med Note Nat Christen Oct 21, 2020 12:59 PM) Every other day   FLUoxetine (PROZAC) 20 MG capsule [id number] Yes Take 1 capsule (20 mg total) by mouth daily. Jody Pheasant, MD Taking Active   fluticasone [facility]) 50 MCG/ACT  nasal spray [id number] Yes Place 2 sprays into both nostrils daily. Jody Pheasant, MD Taking Active   glucose blood [facility] VERIO) test strip [id number] Yes TEST BLOOD SUGAR TWICE DAILY AS DIRECTED Jody Pheasant, MD Taking Active   glucose blood test strip [id number] Yes Use as instructed to check blood sugars to check blood sugars twice daily. Dx E11.9 Jody Pheasant, MD Taking Active   Lancets [facility] ULTRASOFT) lancets [id number] Yes TEST BLOOD SUGAR TWICE DAILY Jody Pheasant, MD Taking Active   levocetirizine (XYZAL) 5 MG tablet [id number] Yes TAKE 1 TABLET EVERY DAY Jody Pheasant, MD Taking Active   levothyroxine (SYNTHROID) 88 MCG tablet [id number] Yes TAKE 1 TABLET EVERY DAY Jody Pheasant, MD Taking Active   metFORMIN (GLUCOPHAGE-XR) 500 MG 24 hr tablet [id number] Yes TAKE 1 TABLET TWICE DAILY Jody Pheasant, MD Taking Active            Med Note Nat Christen Oct 21, 2020 12:58 PM) 2 tabs once daily  [facility] VERIO test strip [id number] Yes USE AS INSTRUCTED TO CHECK BLOOD SUGARS TWICE DAILY.  Jody Pheasant, MD Taking Active   pantoprazole (PROTONIX) 40 MG tablet [id number] Yes On tab po daily Jody Pheasant, MD Taking Active   rosuvastatin (CRESTOR) 40 MG tablet [id number] Yes TAKE 1 TABLET EVERY DAY Jody Pheasant, MD Taking Active   traZODone (DESYREL) 50 MG tablet [id number] Yes Take 1 tablet (50 mg total) by mouth daily. Jody Pheasant, MD Taking Active           Patient Active Problem List   Diagnosis Date Noted  . Memory change 10/11/2020  . Carotid bruit 06/06/2020  . Anemia 02/22/2020  . Cough 01/19/2020  . Thrombocytosis 04/14/2018  . Lung nodule, multiple 03/14/2018  . AK (actinic keratosis) 03/14/2018  . Aortic atherosclerosis ([place]) 05/14/2017  . Coronary artery disease of native artery of native heart with stable angina pectoris ([place]) 05/14/2017  . Abnormal CT of the chest 02/11/2017  . SOB (shortness of breath) on exertion 02/01/2017  . Chest  pain 01/28/2017  . Primary osteoarthritis of right knee 08/29/2016  . Shingles 07/02/2016  . Knee pain 07/11/2015  . Health care maintenance 02/27/2015  . Carotid stenosis 02/27/2015  . Cerumen impaction 06/21/2014  . Weight loss 04/12/2014  . Environmental allergies 04/12/2014  . Right shoulder pain 10/08/2013  . GERD (gastroesophageal reflux disease) 10/09/2012  . Mild depression ([place]) 10/08/2012  . Hypercholesterolemia 10/08/2012  . Hypothyroidism 10/08/2012  . Type 2 diabetes mellitus with hyperglycemia ([place]) 10/08/2012    Conditions to be addressed/monitored per PCP order: HTN, HLD and DMII  Patient Care Plan: Medication Management    Problem Identified: Diabetes, HLD     Long-Range Goal: Disease Progression Prevention   This Visit's Progress: On track  Recent Progress: On track  Priority: High  Note:   Current Barriers:  . Unable to independently afford treatment regimen . Unable to achieve control of diabetes   Pharmacist Clinical Goal(s):  .  Over the next 90 days, patient will verbalize ability to afford treatment regimen. . Over the next 90 days, patient will achieve control of diabetes as evidenced by improvement in A1cthrough collaboration with PharmD and provider.   Interventions: . Inter-disciplinary care team collaboration (see longitudinal plan of care) . Comprehensive medication review performed; medication list updated in electronic medical record  Health Maintenance: . Discussed vaccines. Patient has received yearly influenza vaccine and COVID booster. Called [facility] to verify dates and added to her chart.  . Due for repeat CT scan to f/u on pulmonary nodules seen on last CT scan in 2018. Patient unwilling to schedule due to cost. Discussed support from Care Guides to evaluate community resources to either assist with cost of CT scan, or alleviate other financial burdens to free up funds for CT scan. Patient amenable for this referral and CT order. Will  communicate this to PCP.   Diabetes: . Uncontrolled; current treatment: Trulicity 3 mg weekly (recently increased, using two of the 1.5 mg pens), metformin XR 500 mg BID (dose limited by GI upset)  . Current glucose readings: notes she is not checking often. Reports fasting 126 yesterday morning.  . Denies any significant GI upset after increasing Trulicity dose. Has not received 3 mg pen strength that I previously called in.  . Continue current regimen at this time.  [place]; requested that they ship 3 mg dose.  . Encouraged she increase home BG checks to daily, alternating between fasting and 2 hour post prandial   Hyperlipidemia and ASCVD risk reduction . Controlled; current treatment: rosuvastatin 40 mg daily;  . Antiplatelet therapy: aspirin 81 mg daily (appropriate given CAD dx) . Recommended to continue current treatment regimen.  Depression with Insomnia: . Controlled; current treatment: fluoxetine 20 mg daily, trazodone 50 mg QPM.  . Patient reports mood and sleep are well controlled at this time.  . Recommended to continue current regimen at this time  Hypothyroidism: . Controlled per last TSH; current treatment regimen: levothyroxine 88 mcg daily.  . Recommended to continue current treatment regimen at this time  GERD: . Controlled; current treatment: pantoprazole 40 mg daily . Recommended to continue current treatment regimen at this time  Osteoarthritis: . Moderately well controlled; diclofenac 50 mg BID PRN - taking PRN, though usually at least 1 dose daily . Recommended to continue current regimen at this time with renal function monioring  Supplement: Marland Kitchen Vitamin D, Vitamin B12, ferrous sulfate 325 mg every other day - reports she is tolerating this well at this time  Patient Goals/Self-Care Activities . Over the next 90 days, patient will:  - take medications as prescribed check glucose daily, document, and provide at future  appointments collaborate with provider on medication access solutions  Follow Up Plan: Telephone follow up appointment with care management team member scheduled for: ~ 6 weeks      Medication Assistance: Trulicity obtained through [place] medication assistance program through 11/05/21  Plan: Telephone follow up appointment with care management team member scheduled for: ~ 6 weeks  Catie Darnelle Maffucci, PharmD, [place], [place] Clinical Pharmacist [employer] at [employer] [phone]

## 2020-10-27 ENCOUNTER — Telehealth: Payer: Self-pay | Admitting: Internal Medicine

## 2020-10-27 NOTE — Telephone Encounter (Signed)
Referral Reason: Food Insecurity and Financial Difficulties related to car payment and mortgage payment.   Interventions: Unsuccessful outbound call placed to the patient. A HIPAA compliant voice message was left requesting a return call. Also informed patient that [facility] has left the [employer] gift card at the office.  Follow up plan: Care guide will follow up with patient by phone over the next week.  and Will also send resource letter to patient with list of organization that can possibly give assistance in the future.

## 2020-11-02 ENCOUNTER — Telehealth: Payer: Self-pay | Admitting: Internal Medicine

## 2020-11-02 NOTE — Telephone Encounter (Signed)
lft vm for pt to call [phone] option 3 and then 2 to sch CT no auth req.

## 2020-11-10 ENCOUNTER — Telehealth: Payer: Self-pay | Admitting: Internal Medicine

## 2020-11-10 NOTE — Telephone Encounter (Signed)
Referral Reason: Financial Difficulties related to utilities  Interventions: Received call from patient regarding referral. Paitent stated that she e-mailed copies of her utility bills. Informed Ms. Kollman that Care Guide has not received a copy of her bills. Paitent re-sent e-mail to Care Guide. Care Guide confrimed with patient that copy of utility bills have been received.   Follow up plan: Care Guide will complete referral to [facility] and give patient a call once referral has been processed.

## 2020-11-15 NOTE — Telephone Encounter (Signed)
   Referral Reason: Financial Difficulties related to utilities  Interventions: Unsuccessful outbound call placed to the patient. A HIPAA compliant voice message was left requesting a return call. Follow up call placed to the patient to discuss status of referral Received message from [facility] regarding referral for [name]. The referral has been sent to accounta payable for both the patient's [employer] and [employer] bill to be paid. Left patient a message giving her this information.   Follow up plan: No further follow up planned at this time. The patient has been provided with needed resources.  Closing referral pending any other needs of patient.

## 2020-12-03 ENCOUNTER — Ambulatory Visit: Payer: Medicare Other | Admitting: Pharmacist

## 2020-12-03 ENCOUNTER — Telehealth: Payer: Self-pay | Admitting: Internal Medicine

## 2020-12-03 DIAGNOSIS — F320 Major depressive disorder, single episode, mild: Secondary | ICD-10-CM

## 2020-12-03 DIAGNOSIS — E1165 Type 2 diabetes mellitus with hyperglycemia: Secondary | ICD-10-CM

## 2020-12-03 DIAGNOSIS — F32A Depression, unspecified: Secondary | ICD-10-CM

## 2020-12-03 DIAGNOSIS — E7800 Pure hypercholesterolemia, unspecified: Secondary | ICD-10-CM

## 2020-12-03 DIAGNOSIS — I25118 Atherosclerotic heart disease of native coronary artery with other forms of angina pectoris: Secondary | ICD-10-CM

## 2020-12-03 NOTE — Telephone Encounter (Signed)
Patient was informed.

## 2020-12-03 NOTE — Telephone Encounter (Signed)
Saw Jody Wang today. Reported exposed to covid and with persistent cough.  Please call and confirm doing ok.  If desires can schedule a virtual visit with me today at 4:30.

## 2020-12-03 NOTE — Telephone Encounter (Signed)
If has no problem taking robitussin, then she can take robitussin DM prn.  Keep [place] posted and let us know if needs anything.  Any worsening symptoms, needs to be evaluated.

## 2020-12-03 NOTE — Patient Instructions (Signed)
Visit Information  Goals Addressed              This Visit's Progress     Patient Stated   .  Medication Monitoring (pt-stated)        Patient Goals/Self-Care Activities . Over the next 90 days, patient will:  - take medications as prescribed check glucose daily, document, and provide at future appointments collaborate with provider on medication access solutions         Patient verbalizes understanding of instructions provided today and agrees to view in [place].  Plan: Telephone follow up appointment with care management team member scheduled for:  ~ 8 weeks  Catie Darnelle Maffucci, PharmD, [place], [place] Clinical Pharmacist [employer] at [employer] [phone]

## 2020-12-03 NOTE — Chronic Care Management (AMB) (Signed)
**Note Jody-Identified via Obfuscation** Chronic Care Management Pharmacy Note  12/03/2020 Name:  Jody Wang MRN:  [id number] DOB:  1946/10/27  Subjective: [name] is an 75 y.o. year old female who is a primary patient of Jody Pheasant, MD.  The CCM team was consulted for assistance with disease management and care coordination needs.    Engaged with patient by telephone for follow up visit in response to provider referral for pharmacy case management and/or care coordination services.   Consent to Services:  The patient was given the following information about Chronic Care Management services today, agreed to services, and gave verbal consent: 1. CCM service includes personalized support from designated clinical staff supervised by the primary care provider, including individualized plan of care and coordination with other care providers 2. 24/7 contact phone numbers for assistance for urgent and routine care needs. 3. Service will only be billed when office clinical staff spend 20 minutes or more in a month to coordinate care. 4. Only one practitioner may furnish and bill the service in a calendar month. 5.The patient may stop CCM services at any time (effective at the end of the month) by phone call to the office staff. 6. The patient will be responsible for cost sharing (co-pay) of up to 20% of the service fee (after annual deductible is met). Patient agreed to services and consent obtained.  Objective:  Lab Results  Component Value Date   CREATININE 0.88 09/27/2020   CREATININE 0.86 05/27/2020   CREATININE 0.86 02/12/2020    Lab Results  Component Value Date   HGBA1C 7.5 (H) 09/27/2020       Component Value Date/Time   CHOL 104 09/27/2020 1101   TRIG 89.0 09/27/2020 1101   HDL 41.90 09/27/2020 1101   CHOLHDL 2 09/27/2020 1101   VLDL 17.8 09/27/2020 1101   LDLCALC 44 09/27/2020 1101   LDLDIRECT 188.8 06/30/2013 1131     BP Readings from Last 3 Encounters:  10/04/20 120/70  06/01/20  118/70  02/17/20 124/76    Assessment: Review of patient past medical history, allergies, medications, health status, including review of consultants reports, laboratory and other test data, was performed as part of comprehensive evaluation and provision of chronic care management services.   SDOH:  (Social Determinants of Health) assessments and interventions performed:  SDOH Interventions   Flowsheet Row Most Recent Value  SDOH Interventions   Financial Strain Interventions Other (Comment)  [manufacturer assistance, care guide referral]  Stress Interventions Intervention Not Indicated      CCM Care Plan  Allergies  Allergen Reactions  . Codeine Shortness Of Breath    stops breathing  . Meperidine And Related Shortness Of Breath    Stops breathing  . Oxycontin [Oxycodone Hcl] Shortness Of Breath    Stops breathing  . Jardiance [Empagliflozin] Other (See Comments)    Yeast infections  . Tape Rash    Paper tape ok    Medications Reviewed Today    Reviewed by Jody Wang, RPH-CPP (Pharmacist) on 12/03/20 at 1323  Med List Status: <None>  Medication Order Taking? Sig Documenting Provider Last Dose Status Informant  acetaminophen (TYLENOL) 650 MG CR tablet [id number] Yes Take 1,300 mg by mouth in the morning and at bedtime. [provider] Taking Active   aspirin 81 MG chewable tablet [id number] Yes Chew 81 mg by mouth daily. [provider] Taking Active   blood glucose meter kit and supplies KIT [id number] Yes Dispense based on patient and insurance preference. Use to check  blood sugars twice daily. DX E11.9 Jody Pheasant, MD Taking Active   Blood Glucose Monitoring Suppl [facility] VERIO) w/Device KIT [id number] Yes 1 Device by Does not apply route daily. Jody Pheasant, MD Taking Active   cholecalciferol (VITAMIN D) 1000 units tablet [id number] Yes Take 1,000 Units by mouth daily. [provider] Taking Active Self  cyanocobalamin 1000 MCG tablet  [id number] Yes Take 1,000 mcg by mouth daily. [provider] Taking Active Self  diclofenac (VOLTAREN) 50 MG EC tablet [id number] Yes TAKE 1 TABLET TWICE DAILY AS NEEDED Jody Mc, MD Taking Active            Med Note Darnelle Maffucci, Arville Lime   Fri Dec 03, 2020  1:20 PM)    Dulaglutide (TRULICITY) 3 GX/2.1JH SOPN [id number] Yes Inject into the skin. [provider] Taking Active   ferrous sulfate 325 (65 FE) MG tablet [id number] Yes Take 1 tablet (325 mg total) by mouth daily with breakfast. Jody Pheasant, MD Taking Active            Med Note Jody Wang Oct 21, 2020 12:59 PM) Every other day   FLUoxetine (PROZAC) 20 MG capsule [id number] Yes Take 1 capsule (20 mg total) by mouth daily. Jody Pheasant, MD Taking Active   fluticasone [facility]) 50 MCG/ACT nasal spray [id number] Yes Place 2 sprays into both nostrils daily. Jody Pheasant, MD Taking Active   glucose blood [facility] VERIO) test strip [id number] Yes TEST BLOOD SUGAR TWICE DAILY AS DIRECTED Jody Pheasant, MD Taking Active   glucose blood test strip [id number] Yes Use as instructed to check blood sugars to check blood sugars twice daily. Dx E11.9 Jody Pheasant, MD Taking Active   Lancets [facility] ULTRASOFT) lancets [id number] Yes TEST BLOOD SUGAR TWICE DAILY Jody Pheasant, MD Taking Active   levocetirizine (XYZAL) 5 MG tablet [id number] Yes TAKE 1 TABLET EVERY DAY Jody Pheasant, MD Taking Active   levothyroxine (SYNTHROID) 88 MCG tablet [id number] Yes TAKE 1 TABLET EVERY DAY Jody Pheasant, MD Taking Active   metFORMIN (GLUCOPHAGE-XR) 500 MG 24 hr tablet [id number] Yes TAKE 1 TABLET TWICE DAILY Jody Pheasant, MD Taking Active            Med Note Jody Wang Oct 21, 2020 12:58 PM) 2 tabs once daily  [facility] VERIO test strip [id number] Yes USE AS INSTRUCTED TO CHECK BLOOD SUGARS TWICE DAILY.  Jody Pheasant, MD Taking Active   pantoprazole (PROTONIX) 40 MG tablet [id number] Yes On tab  po daily Jody Pheasant, MD Taking Active   rosuvastatin (CRESTOR) 40 MG tablet [id number] Yes TAKE 1 TABLET EVERY DAY Jody Pheasant, MD Taking Active   traZODone (DESYREL) 50 MG tablet [id number] Yes Take 1 tablet (50 mg total) by mouth daily. Jody Pheasant, MD Taking Active           Patient Active Problem List   Diagnosis Date Noted  . Memory change 10/11/2020  . Carotid bruit 06/06/2020  . Anemia 02/22/2020  . Cough 01/19/2020  . Thrombocytosis 04/14/2018  . Lung nodule, multiple 03/14/2018  . AK (actinic keratosis) 03/14/2018  . Aortic atherosclerosis ([place]) 05/14/2017  . Coronary artery disease of native artery of native heart with stable angina pectoris ([place]) 05/14/2017  . Abnormal CT of the chest 02/11/2017  . SOB (shortness of breath) on exertion 02/01/2017  . Chest pain 01/28/2017  . Primary osteoarthritis of right knee 08/29/2016  . Shingles 07/02/2016  . Knee pain 07/11/2015  . Health care  maintenance 02/27/2015  . Carotid stenosis 02/27/2015  . Cerumen impaction 06/21/2014  . Weight loss 04/12/2014  . Environmental allergies 04/12/2014  . Right shoulder pain 10/08/2013  . GERD (gastroesophageal reflux disease) 10/09/2012  . Mild depression ([place]) 10/08/2012  . Hypercholesterolemia 10/08/2012  . Hypothyroidism 10/08/2012  . Type 2 diabetes mellitus with hyperglycemia ([place]) 10/08/2012    Conditions to be addressed/monitored: CAD, HTN, HLD, DMII and Depression  Care Plan : Medication Management  Updates made by Jody Wang, RPH-CPP since 12/03/2020 12:00 AM    Problem: Diabetes, HLD     Long-Range Goal: Disease Progression Prevention   Start Date: 12/03/2020  This Visit's Progress: On track  Recent Progress: On track  Priority: High  Note:   Current Barriers:  . Unable to independently afford treatment regimen . Unable to achieve control of diabetes   Pharmacist Clinical Goal(s):  Marland Kitchen Over the next 90 days, patient will verbalize ability to  afford treatment regimen. . Over the next 90 days, patient will achieve control of diabetes as evidenced by improvement in A1cthrough collaboration with PharmD and provider.   Interventions: . 1:1 collaboration with Jody Pheasant, MD regarding development and update of comprehensive plan of care as evidenced by provider attestation and co-signature . Inter-disciplinary care team collaboration (see longitudinal plan of care) . Comprehensive medication review performed; medication list updated in electronic medical record  Health Maintenance: . Believes she had COVID last week. Notes that her younger son came to visit; after he left, he tested positive. Patient did not test. Patient's other son (who lives with her) then became symptomatic and tested positive. Patient reports lingering cough, currently using cough drops. Wonders if Dr. Nicki Reaper has any recommendations or could prescribe anything for cough.  . Reports appreciation for working with Care Guide. Received [employer] [place] card in Dec which helped with groceries; [employer] helped with utility bills and car payment this month. Requests continued assistance, particularly with food, as she anticipates paying taxes in February and not having money for food . Confirms that she contacted her insurance, copay for CT scan would be $110 that she does not have at this time. Plans to schedule when she has the money to do so.  Diabetes: . Uncontrolled but improved; current treatment: Trulicity 3 mg weekly, metformin XR 500 mg BID (dose limited by GI upset)  o Hx yeast infections with Jardiance . Current glucose readings: 120-130s  . Denies any concerns w/ GI upset, symptoms of hypo or hyperglycemia.  Marland Kitchen Approved for [employer] assistance through 2022. Consider food assistance options with low carbohydrate choics  . Encouraged continued adherence.   Hyperlipidemia and ASCVD risk reduction . Controlled per last lipid panel; current treatment:  rosuvastatin 40 mg daily;  . Antiplatelet therapy: aspirin 81 mg daily  . Recommended to continue current treatment regimen. Confirmed adherence.   Depression with Insomnia: . Controlled; current treatment: fluoxetine 20 mg daily, trazodone 50 mg QPM.  . Denies any concerns with mood at this time, even given recent likely COVID infection, financial concerns. Endorses good benefit from fluoxetine.  . Confirms good sleep with trazodone.  . Recommended to continue current regimen at this time. Continue to collaborate and communicate w/ PCP.   Hypothyroidism: . Controlled per last TSH; current treatment regimen: levothyroxine 88 mcg daily.  . Recommended to continue current treatment regimen at this time  GERD: . Controlled per patient report; current treatment: pantoprazole 40 mg daily . Recommended to continue current treatment regimen at this  time  Osteoarthritis: . Moderately well controlled; diclofenac 50 mg BID (taking BID regularly in the winter as cold exacerbates pain); acetaminophen 1300 mg BID . Recommended to continue current regimen at this time with renal function monitoring . Reviewed total max daily dose of acetaminophen.   Supplement: Marland Kitchen Vitamin D, Vitamin B12, ferrous sulfate 325 mg every other day - reports she is tolerating this well at this time  Patient Goals/Self-Care Activities . Over the next 90 days, patient will:  - take medications as prescribed check glucose daily, document, and provide at future appointments collaborate with provider on medication access solutions  Follow Up Plan: Telephone follow up appointment with care management team member scheduled for: ~ 8 weeks      Medication Assistance: Trulicity obtained through [employer] medication assistance program.  Enrollment ends 11/05/21  Follow Up:  Patient agrees to Care Plan and Follow-up.  Plan: Telephone follow up appointment with care management team member scheduled for:  ~ 8 weeks  Catie  Darnelle Maffucci, PharmD, [place], [place] Clinical Pharmacist [employer] at [employer] [phone]

## 2020-12-03 NOTE — Telephone Encounter (Signed)
Attempted to contact patient. Phone continued to ring and did not go to voice message.

## 2020-12-03 NOTE — Telephone Encounter (Signed)
Patient stated she is fine, she refused appointment. She stated she has slight congestion and cough, no other sx. Her original sx started two weeks ago.

## 2020-12-10 ENCOUNTER — Telehealth: Payer: Self-pay | Admitting: Internal Medicine

## 2020-12-10 NOTE — Telephone Encounter (Signed)
Rejection Reason - Patient Declined" Jody Wang said on Dec 09, 2020 2:30 PM [facility]

## 2020-12-14 ENCOUNTER — Telehealth: Payer: Self-pay | Admitting: Internal Medicine

## 2020-12-14 NOTE — Telephone Encounter (Signed)
   Telephone encounter was:  Successful.  12/14/2020 Name: LORRA FREEMAN MRN: [id number] DOB: 1946/05/31  TOMMA EHINGER is a 75 y.o. year old female who is a primary care patient of Einar Pheasant, MD . The community resource team was consulted for assistance with [place] guide performed the following interventions: Discussed resources to assist with food insecurity. Informed Ms. Kunzman that Care Guide will e-mail her a list of food pantries in the area as well as send referral to [facility] for a [employer] gift card. Let patient know that once she receives this gift card that she will no longer be eligible to receive services from [facility] due to $500 limit being reached. Patient stated understanding. Explained to patient that [employer] Whe[employer] currently has a waitlist. Patient stated that she is not interested in Meals on Wheels at this time. .  Follow Up Plan:  Care Guide will give patient a follow up call. Also completed referral to [facility] for a [employer] in the amount of $59.  [place], Care Management Phone: [phone] Email: [email or web address]

## 2020-12-15 NOTE — Telephone Encounter (Signed)
Received message from [place], Geophysical data processor at [facility] that she has increased the amount for [employer] gift card from $59 to $100. Care Guide will call patient to let her know.

## 2020-12-16 NOTE — Telephone Encounter (Signed)
   Telephone encounter was:  Successful.  12/16/2020 Name: [name] MRN: [id number] DOB: [birth year]  Jody Wang is a 75 y.o. year old female who is a primary care patient of Einar Pheasant, MD . The community resource team was consulted for assistance with [place] guide performed the following interventions: Discussed resources to assist with food insecurity. Informed patient that [facility] will be sending a $100 [place] card to the office for her to pick up. Paitent stated understanding and also stated that she received the e-mail from [place] with the list of food pantries in the area. No additional needs at this time. .  Follow Up Plan:  No further follow up planned at this time. The patient has been provided with needed resources. Referral will be closed.   [place], Care Management Phone: [phone] Email: [email or web address]

## 2020-12-31 ENCOUNTER — Telehealth: Payer: Self-pay

## 2020-12-31 ENCOUNTER — Other Ambulatory Visit: Payer: Medicare Other

## 2020-12-31 NOTE — Telephone Encounter (Signed)
Can you call back and remind her that she needs to order refills from [facility] patient assistance? [phone].   THANKS!

## 2020-12-31 NOTE — Telephone Encounter (Signed)
Pt wants you to know that she only has two injections left of Dulaglutide (TRULICITY) 3 UU/7.2ZD SOPN and to please get her some more. She states that she does not receive them from the pharmacy.

## 2020-12-31 NOTE — Telephone Encounter (Signed)
Called and informed pt to call [facility] and gave number provided. Pt understood

## 2021-01-04 ENCOUNTER — Ambulatory Visit: Payer: Medicare Other | Admitting: Internal Medicine

## 2021-01-05 ENCOUNTER — Other Ambulatory Visit: Payer: Self-pay | Admitting: Internal Medicine

## 2021-01-18 ENCOUNTER — Other Ambulatory Visit: Payer: Self-pay | Admitting: Internal Medicine

## 2021-01-20 ENCOUNTER — Other Ambulatory Visit: Payer: Self-pay

## 2021-01-20 DIAGNOSIS — Z760 Encounter for issue of repeat prescription: Secondary | ICD-10-CM

## 2021-01-20 DIAGNOSIS — E1165 Type 2 diabetes mellitus with hyperglycemia: Secondary | ICD-10-CM

## 2021-01-20 MED ORDER — DICLOFENAC SODIUM 50 MG PO TBEC: 50.0000 mg | DELAYED_RELEASE_TABLET | Freq: Two times a day (BID) | ORAL | 1 refills | Status: DC | PRN

## 2021-01-20 MED ORDER — LEVOTHYROXINE SODIUM 88 MCG PO TABS: 88.0000 ug | ORAL_TABLET | Freq: Every day | ORAL | 3 refills | Status: DC

## 2021-01-20 MED ORDER — LEVOCETIRIZINE DIHYDROCHLORIDE 5 MG PO TABS: 5.0000 mg | ORAL_TABLET | Freq: Every day | ORAL | 1 refills | Status: DC

## 2021-01-20 MED ORDER — PANTOPRAZOLE SODIUM 40 MG PO TBEC: DELAYED_RELEASE_TABLET | ORAL | 1 refills | Status: DC

## 2021-01-20 MED ORDER — ROSUVASTATIN CALCIUM 40 MG PO TABS: 40.0000 mg | ORAL_TABLET | Freq: Every day | ORAL | 3 refills | Status: DC

## 2021-01-20 MED ORDER — METFORMIN HCL ER 500 MG PO TB24: 500.0000 mg | ORAL_TABLET | Freq: Two times a day (BID) | ORAL | 3 refills | Status: DC

## 2021-02-03 ENCOUNTER — Ambulatory Visit (INDEPENDENT_AMBULATORY_CARE_PROVIDER_SITE_OTHER): Payer: Medicare Other | Admitting: Pharmacist

## 2021-02-03 DIAGNOSIS — F32A Depression, unspecified: Secondary | ICD-10-CM

## 2021-02-03 DIAGNOSIS — I700 Atherosclerosis of aorta: Secondary | ICD-10-CM

## 2021-02-03 DIAGNOSIS — I25118 Atherosclerotic heart disease of native coronary artery with other forms of angina pectoris: Secondary | ICD-10-CM | POA: Diagnosis not present

## 2021-02-03 DIAGNOSIS — E7800 Pure hypercholesterolemia, unspecified: Secondary | ICD-10-CM

## 2021-02-03 DIAGNOSIS — F320 Major depressive disorder, single episode, mild: Secondary | ICD-10-CM

## 2021-02-03 DIAGNOSIS — E1165 Type 2 diabetes mellitus with hyperglycemia: Secondary | ICD-10-CM

## 2021-02-03 DIAGNOSIS — E039 Hypothyroidism, unspecified: Secondary | ICD-10-CM | POA: Diagnosis not present

## 2021-02-03 DIAGNOSIS — I6523 Occlusion and stenosis of bilateral carotid arteries: Secondary | ICD-10-CM

## 2021-02-03 NOTE — Patient Instructions (Signed)
Visit Information  PATIENT GOALS: Goals Addressed              This Visit's Progress     Patient Stated   .  Medication Monitoring (pt-stated)        Patient Goals/Self-Care Activities . Over the next 90 days, patient will:  - take medications as prescribed check glucose daily, document, and provide at future appointments collaborate with provider on medication access solutions          Patient verbalizes understanding of instructions provided today and agrees to view in [place].   Plan: Telephone follow up appointment with care management team member scheduled for:  ~ 8 weeks  Catie Darnelle Maffucci, PharmD, [place], [place] Clinical Pharmacist [employer] at [employer] [phone]

## 2021-02-03 NOTE — Chronic Care Management (AMB) (Signed)
Chronic Care Management Pharmacy Note  02/03/2021 Name:  Jody Wang MRN:  [id number] DOB:  [birth year]  Subjective: Jody Wang is an 75 y.o. year old female who is a primary patient of Einar Pheasant, MD.  The CCM team was consulted for assistance with disease management and care coordination needs.    Engaged with patient by telephone for follow up visit in response to provider referral for pharmacy case management and/or care coordination services.   Consent to Services:  The patient was given information about Chronic Care Management services, agreed to services, and gave verbal consent prior to initiation of services.  Please see initial visit note for detailed documentation.   Patient Care Team: Einar Pheasant, MD as PCP - General (Internal Medicine) Minna Merritts, MD as Consulting Physician (Cardiology) De Hollingshead, RPH-CPP as Pharmacist (Pharmacist)  Recent office visits: None since our last appointment  Recent consult visits: None since our last [facility] visits: None in previous 6 months  Objective:  Lab Results  Component Value Date   CREATININE 0.88 09/27/2020   CREATININE 0.86 05/27/2020   CREATININE 0.86 02/12/2020    Lab Results  Component Value Date   HGBA1C 7.5 (H) 09/27/2020   Last diabetic Eye exam:  Lab Results  Component Value Date/Time   HMDIABEYEEXA No Retinopathy 07/05/2020 12:00 AM    Last diabetic Foot exam:  Lab Results  Component Value Date/Time   HMDIABFOOTEX on my exam 04/09/2018 12:00 AM        Component Value Date/Time   CHOL 104 09/27/2020 1101   TRIG 89.0 09/27/2020 1101   HDL 41.90 09/27/2020 1101   CHOLHDL 2 09/27/2020 1101   VLDL 17.8 09/27/2020 1101   LDLCALC 44 09/27/2020 1101   LDLDIRECT 188.8 06/30/2013 1131    Hepatic Function Latest Ref Rng & Units 09/27/2020 05/27/2020 02/12/2020  Total Protein 6.0 - 8.3 g/dL 7.4 7.3 7.8  Albumin 3.5 - 5.2 g/dL 4.5 4.4 4.6  AST 0 -  37 U/L 11 12 15   ALT 0 - 35 U/L 9 11 9   Alk Phosphatase 39 - 117 U/L 72 70 77  Total Bilirubin 0.2 - 1.2 mg/dL 0.3 0.3 0.3  Bilirubin, Direct 0.0 - 0.3 mg/dL 0.1 0.1 0.1    Lab Results  Component Value Date/Time   TSH 3.25 02/12/2020 09:43 AM   TSH 1.96 12/27/2016 10:02 AM    CBC Latest Ref Rng & Units 09/27/2020 07/26/2020 05/27/2020  WBC 4.0 - 10.5 K/uL 8.0 11.3(H) 5.8  Hemoglobin 12.0 - 15.0 g/dL 13.7 12.1 11.6(L)  Hematocrit 36.0 - 46.0 % 41.0 37.7 35.9(L)  Platelets 150.0 - 400.0 K/uL 372.0 352.0 393.0    Clinical ASCVD: Yes    Social History   Tobacco Use  Smoking Status Former Smoker  . Packs/day: 1.00  . Years: 23.00  . Pack years: 23.00  . Quit date: 11/20/1983  . Years since quitting: 37.2  Smokeless Tobacco Never Used   BP Readings from Last 3 Encounters:  10/04/20 120/70  06/01/20 118/70  02/17/20 124/76   Pulse Readings from Last 3 Encounters:  10/04/20 90  06/01/20 84  02/17/20 76   Wt Readings from Last 3 Encounters:  10/04/20 138 lb 6.4 oz (62.8 kg)  06/01/20 141 lb 3.2 oz (64 kg)  02/17/20 136 lb (61.7 kg)    Assessment: Review of patient past medical history, allergies, medications, health status, including review of consultants reports, laboratory and other test data, was performed as  part of comprehensive evaluation and provision of chronic care management services.   SDOH:  (Social Determinants of Health) assessments and interventions performed:  SDOH Interventions   Flowsheet Row Most Recent Value  SDOH Interventions   Financial Strain Interventions Other (Comment)  [care guide referral]      CCM Care Plan  Allergies  Allergen Reactions  . Codeine Shortness Of Breath    stops breathing  . Meperidine And Related Shortness Of Breath    Stops breathing  . Oxycontin [Oxycodone Hcl] Shortness Of Breath    Stops breathing  . Jardiance [Empagliflozin] Other (See Comments)    Yeast infections  . Tape Rash    Paper tape ok     Medications Reviewed Today    Reviewed by De Hollingshead, RPH-CPP (Pharmacist) on 02/03/21 at 50  Med List Status: <None>  Medication Order Taking? Sig Documenting Provider Last Dose Status Informant  acetaminophen (TYLENOL) 650 MG CR tablet [id number] Yes Take 1,300 mg by mouth in the morning and at bedtime. [provider] Taking Active   aspirin 81 MG chewable tablet [id number] Yes Chew 81 mg by mouth daily. [provider] Taking Active   blood glucose meter kit and supplies KIT [id number] Yes Dispense based on patient and insurance preference. Use to check blood sugars twice daily. DX E11.9 Einar Pheasant, MD Taking Active   cholecalciferol (VITAMIN D) 1000 units tablet [id number] Yes Take 1,000 Units by mouth daily. [provider] Taking Active Self  cyanocobalamin 1000 MCG tablet [id number] Yes Take 1,000 mcg by mouth daily. [provider] Taking Active Self  diclofenac (VOLTAREN) 50 MG EC tablet [id number]  Take 1 tablet (50 mg total) by mouth 2 (two) times daily as needed. Einar Pheasant, MD  Active   Dulaglutide (TRULICITY) 3 BT/5.1VO [facility] [id number] Yes Inject into the skin. [provider] Taking Active   ferrous sulfate 325 (65 FE) MG tablet [id number] Yes Take 1 tablet (325 mg total) by mouth daily with breakfast. Einar Pheasant, MD Taking Active            Med Note Nat Christen Feb 03, 2021  1:07 PM)    FLUoxetine (PROZAC) 20 MG capsule [id number] Yes TAKE 1 CAPSULE BY MOUTH  DAILY Einar Pheasant, MD Taking Active   fluticasone (FLONASE) 50 MCG/ACT nasal spray [id number] Yes Place 2 sprays into both nostrils daily. Einar Pheasant, MD Taking Active   glucose blood [facility] VERIO) test strip [id number]  TEST BLOOD SUGAR TWICE DAILY AS DIRECTED Einar Pheasant, MD  Active   glucose blood test strip [id number]  Use as instructed to check blood sugars to check blood sugars twice daily. Dx E11.9 Einar Pheasant, MD  Active    levocetirizine (XYZAL) 5 MG tablet [id number] Yes Take 1 tablet (5 mg total) by mouth daily. Einar Pheasant, MD Taking Active   levothyroxine (SYNTHROID) 88 MCG tablet [id number] Yes Take 1 tablet (88 mcg total) by mouth daily. Einar Pheasant, MD Taking Active   metFORMIN (GLUCOPHAGE-XR) 500 MG 24 hr tablet [id number] Yes Take 1 tablet (500 mg total) by mouth 2 (two) times daily. Einar Pheasant, MD Taking Active            Med Note Nat Christen Feb 03, 2021  1:04 PM) 1000 mg daily   pantoprazole (PROTONIX) 40 MG tablet [id number] Yes On tab po daily Einar Pheasant, MD Taking Active   rosuvastatin (CRESTOR) 40 MG tablet [id number] Yes Take 1 tablet (40  mg total) by mouth daily. Einar Pheasant, MD Taking Active   traZODone (DESYREL) 50 MG tablet [id number] Yes TAKE 1 TABLET BY MOUTH  DAILY Einar Pheasant, MD Taking Active           Patient Active Problem List   Diagnosis Date Noted  . Memory change 10/11/2020  . Carotid bruit 06/06/2020  . Anemia 02/22/2020  . Cough 01/19/2020  . Thrombocytosis 04/14/2018  . Lung nodule, multiple 03/14/2018  . AK (actinic keratosis) 03/14/2018  . Aortic atherosclerosis ([place]) 05/14/2017  . Coronary artery disease of native artery of native heart with stable angina pectoris ([place]) 05/14/2017  . Abnormal CT of the chest 02/11/2017  . SOB (shortness of breath) on exertion 02/01/2017  . Chest pain 01/28/2017  . Primary osteoarthritis of right knee 08/29/2016  . Shingles 07/02/2016  . Knee pain 07/11/2015  . Health care maintenance 02/27/2015  . Carotid stenosis 02/27/2015  . Cerumen impaction 06/21/2014  . Weight loss 04/12/2014  . Environmental allergies 04/12/2014  . Right shoulder pain 10/08/2013  . GERD (gastroesophageal reflux disease) 10/09/2012  . Mild depression ([place]) 10/08/2012  . Hypercholesterolemia 10/08/2012  . Hypothyroidism 10/08/2012  . Type 2 diabetes mellitus with hyperglycemia ([place]) 10/08/2012    Immunization  History  Administered Date(s) Administered  . Influenza Split 08/24/2013, 08/20/2014  . Influenza, High Dose Seasonal PF 09/25/2016, 07/11/2018, 08/26/2019, 08/16/2020  . Influenza,inj,Quad PF,6+ Mos 07/09/2015  . Influenza-Unspecified 08/10/2017  . PFIZER(Purple Top)SARS-COV-2 Vaccination 01/02/2020, 01/28/2020, 08/06/2020  . Pneumococcal Conjugate-13 10/08/2015  . Pneumococcal Polysaccharide-23 07/01/2013  . Zoster 08/24/2013    Conditions to be addressed/monitored: CAD, HTN, HLD and DMII  Care Plan : Medication Management  Updates made by De Hollingshead, RPH-CPP since 02/03/2021 12:00 AM    Problem: Diabetes, HLD     Long-Range Goal: Disease Progression Prevention   Start Date: 12/03/2020  This Visit's Progress: On track  Recent Progress: On track  Priority: High  Note:   Current Barriers:  . Unable to independently afford treatment regimen . Unable to achieve control of diabetes   Pharmacist Clinical Goal(s):  Marland Kitchen Over the next 90 days, patient will verbalize ability to afford treatment regimen. . Over the next 90 days, patient will achieve control of diabetes as evidenced by improvement in A1cthrough collaboration with PharmD and provider.   Interventions: . 1:1 collaboration with Einar Pheasant, MD regarding development and update of comprehensive plan of care as evidenced by provider attestation and co-signature . Inter-disciplinary care team collaboration (see longitudinal plan of care) . Comprehensive medication review performed; medication list updated in electronic medical record  Health Maintenance: . DUE for foot exam . DUE for Tdap  SDOH: . Notes that electricity bill is high right now. Has 8 car payments remaining. Hopes she will be able to afford CT scan at that time. Placing Care Guide referral for food and utility resource support.   Diabetes: . Uncontrolled but improved; current treatment: Trulicity 3 mg weekly, metformin XR 1000 mg daily (dose  limited by GI upset)  o Hx yeast infections with Jardiance . Reports a weight loss of ~ 30 lbs over the past year or so . Current glucose readings: 125-135 . Approved for [employer] through 2022. . Current meal patterns: sleeps until 11 am; lunch: 2 pm, steamed vegetables, cauliflower, carrots; eats a lot of fruit - strawberries, blackberries, bananas, watermelon; snacks: pecans; drinks: fruit juices, coke zero; hasn't tried flavored waters . Current physical activity: limited by knee arthritis. . Discussed water aerobics.  Patient used to go to a local pool. She will consider getting back into doing this . Discussed minimizing juices, incorporating water or flavored waters. She will start doing this.  . Continue current regimen at this time. Follow up with PCP as scheduled.   Hyperlipidemia and ASCVD risk reduction . Controlled per last lipid panel; current treatment: rosuvastatin 40 mg daily;  . Antiplatelet therapy: aspirin 81 mg daily  . Recommended to continue current treatment regimen.    Depression with Insomnia: . Controlled; current treatment: fluoxetine 20 mg daily, trazodone 50 mg QPM.  . Denies any concerns with mood at this time. Notes that she does get "down and out"; goes to play bingo at the senior center, goes to [employer] and reads. Sometimes shops.  . Notes that she sleeps well. Sleeps about 10 hours every night. Feels well rested when she wakes up. . Recommended to continue current regimen at this time. Encouraged continuing to do activities that she enjoys.   Hypothyroidism: . Controlled per last TSH; current treatment regimen: levothyroxine 88 mcg daily.  . Recommended to continue current treatment regimen at this time  GERD: . Controlled per patient report; current treatment: pantoprazole 40 mg daily . Recommended to continue current treatment regimen at this time  Osteoarthritis: . Moderately well controlled; diclofenac 50 mg BID (taking BID regularly in the  winter as cold exacerbates pain); acetaminophen 1300 mg BID . Discussed pool exercises as above.  . Recommended to continue current regimen at this time with renal function monitoring. PCP appointment moving forward.   Supplement: Marland Kitchen Vitamin D, Vitamin B12, ferrous sulfate 325 mg daily  Patient Goals/Self-Care Activities . Over the next 90 days, patient will:  - take medications as prescribed check glucose daily, document, and provide at future appointments collaborate with provider on medication access solutions  Follow Up Plan: Telephone follow up appointment with care management team member scheduled for: ~ 8 weeks      Medication Assistance: Trulicity obtained through [employer] medication assistance program.  Enrollment ends 11/05/21  Patient's preferred pharmacy is:  [facility] [place], [place] [address] Phone: [phone] Fax: [phone]  [place], [place] [address], [address] [place], [address] [place] [address] Phone: [phone] Fax: [phone]  Uses pill box? Yes Pt endorses 100% compliance  Follow Up:  Patient agrees to Care Plan and Follow-up.  Plan: Telephone follow up appointment with care management team member scheduled for:  ~ 8 weeks  Catie Darnelle Maffucci, PharmD, [place], [facility] [place] Clinical Pharmacist [employer] [facility] [employer] [phone]

## 2021-02-04 ENCOUNTER — Other Ambulatory Visit: Payer: Self-pay | Admitting: Internal Medicine

## 2021-02-10 ENCOUNTER — Telehealth: Payer: Self-pay | Admitting: Internal Medicine

## 2021-02-10 NOTE — Telephone Encounter (Signed)
   Telephone encounter was:  Unsuccessful.  02/10/2021 Name: Jody Wang MRN: [id number] DOB: 1946/02/17  Unsuccessful outbound call made today to assist with:  Financial Difficulties related to bills  Outreach Attempt:  1st Attempt  A HIPAA compliant voice message was left requesting a return call.  Instructed patient to call back at [phone].  [name], Care Management Phone: [phone] Email: [email or web address]

## 2021-02-11 ENCOUNTER — Ambulatory Visit (INDEPENDENT_AMBULATORY_CARE_PROVIDER_SITE_OTHER): Payer: Medicare Other

## 2021-02-11 VITALS — Ht 61.0 in | Wt 131.0 lb

## 2021-02-11 DIAGNOSIS — Z0000 Encounter for general adult medical examination without abnormal findings: Secondary | ICD-10-CM

## 2021-02-11 NOTE — Progress Notes (Addendum)
Subjective:   SHANIEKA BLEA is a 75 y.o. female who presents for Medicare Annual (Subsequent) preventive examination.  Review of Systems    No ROS.  Medicare Wellness Virtual Visit.    Cardiac Risk Factors include: advanced age (>30mn, >>33women)     Objective:    Today's Vitals   02/11/21 1305  Weight: 131 lb (59.4 kg)  Height: 5' 1"  (1.549 m)   Body mass index is 24.75 kg/m.  Advanced Directives 02/11/2021 02/11/2020 02/10/2019 02/08/2018 04/13/2017 11/30/2016 03/15/2016  Does Patient Have a Medical Advance Directive? Yes No No No Yes No No  Type [place] Does patient want to make changes to medical advance directive? No - Patient declined No - Patient declined - - - No - Patient declined -  Would patient like information on creating a medical advance directive? - - No - Patient declined No - Patient declined - No - Patient declined No - patient declined information    Current Medications (verified) Outpatient Encounter Medications as of 02/11/2021  Medication Sig  . acetaminophen (TYLENOL) 650 MG CR tablet Take 1,300 mg by mouth in the morning and at bedtime.  .Marland Kitchenaspirin 81 MG chewable tablet Chew 81 mg by mouth daily.  . blood glucose meter kit and supplies KIT Dispense based on patient and insurance preference. Use to check blood sugars twice daily. DX E11.9  . cholecalciferol (VITAMIN D) 1000 units tablet Take 1,000 Units by mouth daily.  . cyanocobalamin 1000 MCG tablet Take 1,000 mcg by mouth daily.  . diclofenac (VOLTAREN) 50 MG EC tablet Take 1 tablet (50 mg total) by mouth 2 (two) times daily as needed.  . Dulaglutide (TRULICITY) 3 [facility]/8.2XHSOPN Inject into the skin.  . ferrous sulfate 325 (65 FE) MG tablet Take 1 tablet (325 mg total) by mouth daily with breakfast.  . FLUoxetine (PROZAC) 20 MG capsule TAKE 1 CAPSULE BY MOUTH  DAILY  . fluticasone (FLONASE) 50 MCG/ACT nasal spray USE 2  SPRAYS IN BOTH  NOSTRILS DAILY  . glucose blood (ONETOUCH VERIO) test strip TEST BLOOD SUGAR TWICE DAILY AS DIRECTED  . glucose blood test strip Use as instructed to check blood sugars to check blood sugars twice daily. Dx E11.9  . levocetirizine (XYZAL) 5 MG tablet Take 1 tablet (5 mg total) by mouth daily.  .Marland Kitchenlevothyroxine (SYNTHROID) 88 MCG tablet Take 1 tablet (88 mcg total) by mouth daily.  . metFORMIN (GLUCOPHAGE-XR) 500 MG 24 hr tablet Take 1 tablet (500 mg total) by mouth 2 (two) times daily.  . pantoprazole (PROTONIX) 40 MG tablet On tab po daily  . rosuvastatin (CRESTOR) 40 MG tablet Take 1 tablet (40 mg total) by mouth daily.  . traZODone (DESYREL) 50 MG tablet TAKE 1 TABLET BY MOUTH  DAILY   No facility-administered encounter medications on file as of 02/11/2021.    Allergies (verified) Codeine, Meperidine and related, Oxycontin [oxycodone hcl], Jardiance [empagliflozin], and Tape   History: Past Medical History:  Diagnosis Date  . Depression   . Diabetes mellitus without complication ([place]   . GERD (gastroesophageal reflux disease)   . Hypercholesterolemia   . Hypothyroidism   . Osteoarthritis of knee   . Wears dentures    partial lower   Past Surgical History:  Procedure Laterality Date  . ABDOMINAL HYSTERECTOMY  1985   ovaries not removed  . BREAST CYST EXCISION Right   .  BREAST CYST EXCISION Left   . CATARACT EXTRACTION W/PHACO Right 02/16/2016   Procedure: CATARACT EXTRACTION PHACO AND INTRAOCULAR LENS PLACEMENT ([place]) RIGHT ;  Surgeon: Leandrew Koyanagi, MD;  Location: [facility];  Service: Ophthalmology;  Laterality: Right;  DIABETIC - oral meds  . CATARACT EXTRACTION W/PHACO Left 03/15/2016   Procedure: CATARACT EXTRACTION PHACO AND INTRAOCULAR LENS PLACEMENT ([facility])left eye;  Surgeon: Leandrew Koyanagi, MD;  Location: [facility];  Service: Ophthalmology;  Laterality: Left;  DIABETIC - oral meds  . INNER EAR SURGERY     Family History   Problem Relation Age of Onset  . Diabetes Father   . Coronary artery disease Father        s/p CABG  . Hypertension Father   . Hypercholesterolemia Father   . Hypertension Brother   . Diabetes Brother   . Hypertension Sister   . Glaucoma Sister   . Diabetes Other        niece  . Breast cancer Maternal Aunt   . Breast cancer Paternal Aunt   . Cervical cancer Paternal Aunt   . Breast cancer Cousin   . Breast cancer Other    Social History   Socioeconomic History  . Marital status: Widowed    Spouse name: Not on file  . Number of children: 3  . Years of education: Not on file  . Highest education level: Not on file  Occupational History  . Not on file  Tobacco Use  . Smoking status: Former Smoker    Packs/day: 1.00    Years: 23.00    Pack years: 23.00    Quit date: 11/20/1983    Years since quitting: 37.2  . Smokeless tobacco: Never Used  Vaping Use  . Vaping Use: Never used  Substance and Sexual Activity  . Alcohol use: Yes    Alcohol/week: 0.0 standard drinks    Comment: rare  . Drug use: No  . Sexual activity: Never  Other Topics Concern  . Not on file  Social History Narrative  . Not on file   Social Determinants of Health   Financial Resource Strain: Medium Risk  . Difficulty of Paying Living Expenses: Somewhat hard  Food Insecurity: No Food Insecurity  . Worried About Charity fundraiser in the Last Year: Never true  . Ran Out of Food in the Last Year: Never true  Transportation Needs: No Transportation Needs  . Lack of Transportation (Medical): No  . Lack of Transportation (Non-Medical): No  Physical Activity: Not on file  Stress: No Stress Concern Present  . Feeling of Stress : Not at all  Social Connections: Unknown  . Frequency of Communication with Friends and Family: More than three times a week  . Frequency of Social Gatherings with Friends and Family: More than three times a week  . Attends Religious Services: Not on file  . Active Member  of Clubs or Organizations: Not on file  . Attends Archivist Meetings: Not on file  . Marital Status: Not on file    Tobacco Counseling Counseling given: Not Answered   Clinical Intake:  Pre-visit preparation completed: Yes        Diabetes: Yes (Followed by pcp) Nutrition Risk Assessment: Has the patient had any N/V/D within the last 2 weeks?  No  Does the patient have any non-healing wounds?  No  Has the patient had any unintentional weight loss or weight gain?  No   Diabetes Management: Does the patient want to be  seen by Chronic Care Management for management of their diabetes?  Yes  How often do you need to have someone help you when you read instructions, pamphlets, or other written materials from your doctor or pharmacy?: 1 - Never   Interpreter Needed?: No      Activities of Daily Living In your present state of health, do you have any difficulty performing the following activities: 02/11/2021  Hearing? Y  Comment L ear hearing aid  Vision? N  Difficulty concentrating or making decisions? N  Walking or climbing stairs? N  Dressing or bathing? N  Doing errands, shopping? N  Preparing Food and eating ? N  Using the Toilet? N  In the past six months, have you accidently leaked urine? N  Comment Managed with daily pad  Do you have problems with loss of bowel control? N  Managing your Medications? Y  Comment Managed by son assist  Managing your Finances? N  Housekeeping or managing your Housekeeping? N  Some recent data might be hidden    Patient Care Team: Einar Pheasant, MD as PCP - General (Internal Medicine) Minna Merritts, MD as Consulting Physician (Cardiology) De Hollingshead, RPH-CPP as Pharmacist (Pharmacist)  Indicate any recent Medical Services you may have received from other than [facility] providers in the past year (date may be approximate).     Assessment:   This is a routine wellness examination for [place].  I connected  with [place] today by telephone and verified that I am speaking with the correct person using two identifiers. Location patient: home Location provider: work Persons participating in the virtual visit: patient, Marine scientist.    I discussed the limitations, risks, security and privacy concerns of performing an evaluation and management service by telephone and the availability of in person appointments. The patient expressed understanding and verbally consented to this telephonic visit.    Interactive audio and video telecommunications were attempted between this provider and patient, however failed, due to patient having technical difficulties OR patient did not have access to video capability.  We continued and completed visit with audio only.  Some vital signs may be absent or patient reported.   Hearing/Vision screen  Hearing Screening   125Hz  250Hz  500Hz  1000Hz  2000Hz  3000Hz  4000Hz  6000Hz  8000Hz   Right ear:           Left ear:           Comments: L ear hearing aid.   Vision Screening Comments: Followed by [facility]  Annual visits Cataract extraction, bilateral  Visual acuity not assessed per patient preference since they have regular follow up with the ophthalmologist  Dietary issues and exercise activities discussed: Current Exercise Habits: Home exercise routine, Intensity: Mild  Low carb diet Good water intake  Goals      Patient Stated   .  DIET - INCREASE WATER INTAKE (pt-stated)      Try to drink more water    .  Medication Monitoring (pt-stated)      Patient Goals/Self-Care Activities . Over the next 90 days, patient will:  - take medications as prescribed check glucose daily, document, and provide at future appointments collaborate with provider on medication access solutions         Depression Screen PHQ 2/9 Scores 02/11/2021 12/03/2020 10/04/2020 02/11/2020 02/10/2019 02/08/2018 11/30/2016  PHQ - 2 Score 0 - 0 0 0 0 1  PHQ- 9 Score - - 0 - - - -  Exception  Documentation - (No Data) - - - - -  Fall Risk Fall Risk  02/11/2021 02/11/2020 02/10/2019 02/08/2018 11/30/2016  Falls in the past year? 0 0 0 No No  Number falls in past yr: 0 - - - -  Injury with Fall? 0 - - - -  Comment - - - - -  Follow up Falls evaluation completed Falls evaluation completed - - -    FALL RISK PREVENTION PERTAINING TO THE HOME: Handrails in use when climbing stairs? Yes Home free of loose throw rugs in walkways, pet beds, electrical cords, etc? Yes  Adequate lighting in your home to reduce risk of falls? Yes   ASSISTIVE DEVICES UTILIZED TO PREVENT FALLS: Life alert? No  Use of a cane, walker or w/c? No   TIMED UP AND GO: Was the test performed? No . Virtual visit.   Cognitive Function: MMSE - Mini Mental State Exam 10/04/2020 11/30/2015  Orientation to time 3 5  Orientation to Place 5 5  Registration 3 3  Attention/ Calculation 5 5  Recall 3 3  Language- name 2 objects 2 2  Language- repeat 1 1  Language- follow 3 step command 2 3  Language- read & follow direction 1 1  Write a sentence 1 1  Copy design 1 1  Total score 27 30     6CIT Screen 02/11/2021 02/11/2020 02/10/2019 02/08/2018 11/30/2016  What Year? 0 points 0 points 0 points 0 points 0 points  What month? 0 points 0 points 0 points 0 points 0 points  What time? 0 points 0 points 0 points 0 points 0 points  Count back from 20 0 points 0 points 0 points 0 points 0 points  Months in reverse 0 points 0 points 0 points 0 points 0 points  Repeat phrase 0 points 0 points 0 points 0 points 0 points  Total Score 0 0 0 0 0    Immunizations Immunization History  Administered Date(s) Administered  . Influenza Split 08/24/2013, 08/20/2014  . Influenza, High Dose Seasonal PF 09/25/2016, 07/11/2018, 08/26/2019, 08/16/2020  . Influenza,inj,Quad PF,6+ Mos 07/09/2015  . Influenza-Unspecified 08/10/2017  . PFIZER(Purple Top)SARS-COV-2 Vaccination 01/02/2020, 01/28/2020, 08/06/2020  . Pneumococcal Conjugate-13  10/08/2015  . Pneumococcal Polysaccharide-23 07/01/2013  . Zoster 08/24/2013    TDAP status: Due, Education has been provided regarding the importance of this vaccine. Advised may receive this vaccine at local pharmacy or Health Dept. Aware to provide a copy of the vaccination record if obtained from local pharmacy or Health Dept. Verbalized acceptance and understanding. Deferred.   Health Maintenance Health Maintenance  Topic Date Due  . URINE MICROALBUMIN  02/11/2021  . COVID-19 Vaccine (4 - Booster for Pfizer series) 02/27/2021 (Originally 02/04/2021)  . FOOT EXAM  03/08/2021 (Originally 04/10/2019)  . TETANUS/TDAP  02/11/2022 (Originally 10/08/1965)  . HEMOGLOBIN A1C  03/27/2021  . INFLUENZA VACCINE  06/06/2021  . OPHTHALMOLOGY EXAM  07/05/2021  . Fecal DNA (Cologuard)  02/23/2022  . DEXA SCAN  Completed  . Hepatitis C Screening  Completed  . PNA vac Low Risk Adult  Completed  . HPV VACCINES  Aged Out   Colorectal cancer screening: Type of screening: Cologuard. Completed 05/2018. Repeat every 3 years   Mammogram status: Completed 10/16/20. Repeat every year  Low Dose CT Chest recommended: Patient plans to have CT completed after speaking with pcp next visit.   Dental Screening: Recommended annual dental exams for proper oral hygiene.  Community Resource Referral / Chronic Care Management: CRR required this visit?  No   CCM required this visit?  No      Plan:   Keep all routine maintenance appointments.   Next scheduled lab 03/04/21 @ 11:00  Follow up 03/08/21 @ 3:00; foot exam due. Deferred   CCM 04/12/21 @ 1:00  I have personally reviewed and noted the following in the patient's chart:   . Medical and social history . Use of alcohol, tobacco or illicit drugs  . Current medications and supplements . Functional ability and status . Nutritional status . Physical activity . Advanced directives . List of other physicians . Hospitalizations, surgeries, and ER visits in  previous 12 months . Vitals . Screenings to include cognitive, depression, and falls . Referrals and appointments  In addition, I have reviewed and discussed with patient certain preventive protocols, quality metrics, and best practice recommendations. A written personalized care plan for preventive services as well as general preventive health recomm endations were provided to patient via mychart.     Varney Biles, LPN   06/09/4170

## 2021-02-11 NOTE — Patient Instructions (Addendum)
[name] , Thank you for taking time to come for your Medicare Wellness Visit. I appreciate your ongoing commitment to your health goals. Please review the following plan we discussed and let me know if I can assist you in the future.   These are the goals we discussed: Goals      Patient Stated   .  DIET - INCREASE WATER INTAKE (pt-stated)      Try to drink more water    .  Medication Monitoring (pt-stated)      Patient Goals/Self-Care Activities . Over the next 90 days, patient will:  - take medications as prescribed check glucose daily, document, and provide at future appointments collaborate with provider on medication access solutions          This is a list of the screening recommended for you and due dates:  Health Maintenance  Topic Date Due  . Urine Protein Check  02/11/2021  . COVID-19 Vaccine (4 - Booster for Pfizer series) 02/27/2021*  . Complete foot exam   03/08/2021*  . Tetanus Vaccine  02/11/2022*  . Hemoglobin A1C  03/27/2021  . Flu Shot  06/06/2021  . Eye exam for diabetics  07/05/2021  . Cologuard (Stool DNA test)  02/23/2022  . DEXA scan (bone density measurement)  Completed  .  Hepatitis C: One time screening is recommended by Center for Disease Control  (CDC) for  adults born from [birth year] through 1966.   Completed  . Pneumonia vaccines  Completed  . HPV Vaccine  Aged Out  *Topic was postponed. The date shown is not the original due date.   Immunizations Immunization History  Administered Date(s) Administered  . Influenza Split 08/24/2013, 08/20/2014  . Influenza, High Dose Seasonal PF 09/25/2016, 07/11/2018, 08/26/2019, 08/16/2020  . Influenza,inj,Quad PF,6+ Mos 07/09/2015  . Influenza-Unspecified 08/10/2017  . PFIZER(Purple Top)SARS-COV-2 Vaccination 01/02/2020, 01/28/2020, 08/06/2020  . Pneumococcal Conjugate-13 10/08/2015  . Pneumococcal Polysaccharide-23 07/01/2013  . Zoster 08/24/2013   Keep all routine maintenance appointments.   Next  scheduled lab 03/04/21 @ 11:00  Follow up 03/08/21 @ 3:00; foot exam due. Deferred   CCM 04/12/21 @ 1:00  Advanced directives: End of life planning; Advance aging; Advanced directives discussed.  Copy of current HCPOA/Living Will requested.    Conditions/risks identified: none new  Follow up in one year for your annual wellness visit    Preventive Care 65 Years and Older, Female Preventive care refers to lifestyle choices and visits with your health care provider that can promote health and wellness. What does preventive care include?  A yearly physical exam. This is also called an annual well check.  Dental exams once or twice a year.  Routine eye exams. Ask your health care provider how often you should have your eyes checked.  Personal lifestyle choices, including:  Daily care of your teeth and gums.  Regular physical activity.  Eating a healthy diet.  Avoiding tobacco and drug use.  Limiting alcohol use.  Practicing safe sex.  Taking low-dose aspirin every day.  Taking vitamin and mineral supplements as recommended by your health care provider. What happens during an annual well check? The services and screenings done by your health care provider during your annual well check will depend on your age, overall health, lifestyle risk factors, and family history of disease. Counseling  Your health care provider may ask you questions about your:  Alcohol use.  Tobacco use.  Drug use.  Emotional well-being.  Home and relationship well-being.  Sexual activity.  Eating habits.  History of falls.  Memory and ability to understand (cognition).  Work and work Statistician.  Reproductive health. Screening  You may have the following tests or measurements:  Height, weight, and BMI.  Blood pressure.  Lipid and cholesterol levels. These may be checked every 5 years, or more frequently if you are over 49 years old.  Skin check.  Lung cancer screening. You  may have this screening every year starting at age 4 if you have a 30-pack-year history of smoking and currently smoke or have quit within the past 15 years.  Fecal occult blood test (FOBT) of the stool. You may have this test every year starting at age 74.  Flexible sigmoidoscopy or colonoscopy. You may have a sigmoidoscopy every 5 years or a colonoscopy every 10 years starting at age 64.  Hepatitis C blood test.  Hepatitis B blood test.  Sexually transmitted disease (STD) testing.  Diabetes screening. This is done by checking your blood sugar (glucose) after you have not eaten for a while (fasting). You may have this done every 1-3 years.  Bone density scan. This is done to screen for osteoporosis. You may have this done starting at age 80.  Mammogram. This may be done every 1-2 years. Talk to your health care provider about how often you should have regular mammograms. Talk with your health care provider about your test results, treatment options, and if necessary, the need for more tests. Vaccines  Your health care provider may recommend certain vaccines, such as:  Influenza vaccine. This is recommended every year.  Tetanus, diphtheria, and acellular pertussis (Tdap, Td) vaccine. You may need a Td booster every 10 years.  Zoster vaccine. You may need this after age 48.  Pneumococcal 13-valent conjugate (PCV13) vaccine. One dose is recommended after age 18.  Pneumococcal polysaccharide (PPSV23) vaccine. One dose is recommended after age 23. Talk to your health care provider about which screenings and vaccines you need and how often you need them. This information is not intended to replace advice given to you by your health care provider. Make sure you discuss any questions you have with your health care provider. Document Released: 11/19/2015 Document Revised: 07/12/2016 Document Reviewed: 08/24/2015 Elsevier Interactive Patient Education  2017 [place] Prevention  in the Home Falls can cause injuries. They can happen to people of all ages. There are many things you can do to make your home safe and to help prevent falls. What can I do on the outside of my home?  Regularly fix the edges of walkways and driveways and fix any cracks.  Remove anything that might make you trip as you walk through a door, such as a raised step or threshold.  Trim any bushes or trees on the path to your home.  Use bright outdoor lighting.  Clear any walking paths of anything that might make someone trip, such as rocks or tools.  Regularly check to see if handrails are loose or broken. Make sure that both sides of any steps have handrails.  Any raised decks and porches should have guardrails on the edges.  Have any leaves, snow, or ice cleared regularly.  Use sand or salt on walking paths during winter.  Clean up any spills in your garage right away. This includes oil or grease spills. What can I do in the bathroom?  Use night lights.  Install grab bars by the toilet and in the tub and shower. Do not use towel bars as  grab bars.  Use non-skid mats or decals in the tub or shower.  If you need to sit down in the shower, use a plastic, non-slip stool.  Keep the floor dry. Clean up any water that spills on the floor as soon as it happens.  Remove soap buildup in the tub or shower regularly.  Attach bath mats securely with double-sided non-slip rug tape.  Do not have throw rugs and other things on the floor that can make you trip. What can I do in the bedroom?  Use night lights.  Make sure that you have a light by your bed that is easy to reach.  Do not use any sheets or blankets that are too big for your bed. They should not hang down onto the floor.  Have a firm chair that has side arms. You can use this for support while you get dressed.  Do not have throw rugs and other things on the floor that can make you trip. What can I do in the kitchen?  Clean  up any spills right away.  Avoid walking on wet floors.  Keep items that you use a lot in easy-to-reach places.  If you need to reach something above you, use a strong step stool that has a grab bar.  Keep electrical cords out of the way.  Do not use floor polish or wax that makes floors slippery. If you must use wax, use non-skid floor wax.  Do not have throw rugs and other things on the floor that can make you trip. What can I do with my stairs?  Do not leave any items on the stairs.  Make sure that there are handrails on both sides of the stairs and use them. Fix handrails that are broken or loose. Make sure that handrails are as long as the stairways.  Check any carpeting to make sure that it is firmly attached to the stairs. Fix any carpet that is loose or worn.  Avoid having throw rugs at the top or bottom of the stairs. If you do have throw rugs, attach them to the floor with carpet tape.  Make sure that you have a light switch at the top of the stairs and the bottom of the stairs. If you do not have them, ask someone to add them for you. What else can I do to help prevent falls?  Wear shoes that:  Do not have high heels.  Have rubber bottoms.  Are comfortable and fit you well.  Are closed at the toe. Do not wear sandals.  If you use a stepladder:  Make sure that it is fully opened. Do not climb a closed stepladder.  Make sure that both sides of the stepladder are locked into place.  Ask someone to hold it for you, if possible.  Clearly mark and make sure that you can see:  Any grab bars or handrails.  First and last steps.  Where the edge of each step is.  Use tools that help you move around (mobility aids) if they are needed. These include:  Canes.  Walkers.  Scooters.  Crutches.  Turn on the lights when you go into a dark area. Replace any light bulbs as soon as they burn out.  Set up your furniture so you have a clear path. Avoid moving your  furniture around.  If any of your floors are uneven, fix them.  If there are any pets around you, be aware of where they are.  Review your medicines with your doctor. Some medicines can make you feel dizzy. This can increase your chance of falling. Ask your doctor what other things that you can do to help prevent falls. This information is not intended to replace advice given to you by your health care provider. Make sure you discuss any questions you have with your health care provider. Document Released: 08/19/2009 Document Revised: 03/30/2016 Document Reviewed: 11/27/2014 Elsevier Interactive Patient Education  2017 [employer].

## 2021-03-04 ENCOUNTER — Other Ambulatory Visit (INDEPENDENT_AMBULATORY_CARE_PROVIDER_SITE_OTHER): Payer: Medicare Other

## 2021-03-04 ENCOUNTER — Other Ambulatory Visit: Payer: Self-pay

## 2021-03-04 DIAGNOSIS — E1165 Type 2 diabetes mellitus with hyperglycemia: Secondary | ICD-10-CM

## 2021-03-04 DIAGNOSIS — D649 Anemia, unspecified: Secondary | ICD-10-CM | POA: Diagnosis not present

## 2021-03-04 DIAGNOSIS — E7800 Pure hypercholesterolemia, unspecified: Secondary | ICD-10-CM

## 2021-03-04 LAB — CBC WITH DIFFERENTIAL/PLATELET
Basophils Absolute: 0 10*3/uL (ref 0.0–0.1)
Basophils Relative: 0.4 % (ref 0.0–3.0)
Eosinophils Absolute: 0.3 10*3/uL (ref 0.0–0.7)
Eosinophils Relative: 4.3 % (ref 0.0–5.0)
HCT: 40.5 % (ref 36.0–46.0)
Hemoglobin: 13.6 g/dL (ref 12.0–15.0)
Lymphocytes Relative: 24.9 % (ref 12.0–46.0)
Lymphs Abs: 1.9 10*3/uL (ref 0.7–4.0)
MCHC: 33.5 g/dL (ref 30.0–36.0)
MCV: 87.4 fl (ref 78.0–100.0)
Monocytes Absolute: 0.6 10*3/uL (ref 0.1–1.0)
Monocytes Relative: 8.2 % (ref 3.0–12.0)
Neutro Abs: 4.7 10*3/uL (ref 1.4–7.7)
Neutrophils Relative %: 62.2 % (ref 43.0–77.0)
Platelets: 344 10*3/uL (ref 150.0–400.0)
RBC: 4.64 Mil/uL (ref 3.87–5.11)
RDW: 13.8 % (ref 11.5–15.5)
WBC: 7.5 10*3/uL (ref 4.0–10.5)

## 2021-03-04 LAB — BASIC METABOLIC PANEL
BUN: 14 mg/dL (ref 6–23)
CO2: 24 mEq/L (ref 19–32)
Calcium: 9.1 mg/dL (ref 8.4–10.5)
Chloride: 104 mEq/L (ref 96–112)
Creatinine, Ser: 0.81 mg/dL (ref 0.40–1.20)
GFR: 71.02 mL/min (ref >60.00)
Glucose, Bld: 166 mg/dL — ABNORMAL HIGH (ref 70–99)
Potassium: 4.5 mEq/L (ref 3.5–5.1)
Sodium: 138 mEq/L (ref 135–145)

## 2021-03-04 LAB — LIPID PANEL
Cholesterol: 121 mg/dL (ref 0–200)
HDL: 39.6 mg/dL (ref >39.00)
LDL Cholesterol: 54 mg/dL (ref 0–99)
NonHDL: 81.61
Total CHOL/HDL Ratio: 3
Triglycerides: 137 mg/dL (ref 0.0–149.0)
VLDL: 27.4 mg/dL (ref 0.0–40.0)

## 2021-03-04 LAB — HEPATIC FUNCTION PANEL
ALT: 11 U/L (ref 0–35)
AST: 11 U/L (ref 0–37)
Albumin: 4.4 g/dL (ref 3.5–5.2)
Alkaline Phosphatase: 77 U/L (ref 39–117)
Bilirubin, Direct: 0.1 mg/dL (ref 0.0–0.3)
Total Bilirubin: 0.3 mg/dL (ref 0.2–1.2)
Total Protein: 7.4 g/dL (ref 6.0–8.3)

## 2021-03-04 LAB — IBC + FERRITIN
Ferritin: 16.8 ng/mL (ref 10.0–291.0)
Iron: 49 ug/dL (ref 42–145)
Saturation Ratios: 13 % — ABNORMAL LOW (ref 20.0–50.0)
Transferrin: 270 mg/dL (ref 212.0–360.0)

## 2021-03-04 LAB — HEMOGLOBIN A1C: Hgb A1c MFr Bld: 7.1 % — ABNORMAL HIGH (ref 4.6–6.5)

## 2021-03-08 ENCOUNTER — Ambulatory Visit (INDEPENDENT_AMBULATORY_CARE_PROVIDER_SITE_OTHER): Payer: Medicare Other | Admitting: Internal Medicine

## 2021-03-08 ENCOUNTER — Other Ambulatory Visit: Payer: Self-pay

## 2021-03-08 VITALS — BP 116/66 | HR 82 | Temp 96.0°F | Resp 16 | Ht 61.0 in | Wt 137.6 lb

## 2021-03-08 DIAGNOSIS — E039 Hypothyroidism, unspecified: Secondary | ICD-10-CM

## 2021-03-08 DIAGNOSIS — E1165 Type 2 diabetes mellitus with hyperglycemia: Secondary | ICD-10-CM | POA: Diagnosis not present

## 2021-03-08 DIAGNOSIS — E7800 Pure hypercholesterolemia, unspecified: Secondary | ICD-10-CM

## 2021-03-08 DIAGNOSIS — F320 Major depressive disorder, single episode, mild: Secondary | ICD-10-CM

## 2021-03-08 DIAGNOSIS — R9389 Abnormal findings on diagnostic imaging of other specified body structures: Secondary | ICD-10-CM

## 2021-03-08 DIAGNOSIS — D649 Anemia, unspecified: Secondary | ICD-10-CM

## 2021-03-08 DIAGNOSIS — I700 Atherosclerosis of aorta: Secondary | ICD-10-CM

## 2021-03-08 DIAGNOSIS — F32A Depression, unspecified: Secondary | ICD-10-CM

## 2021-03-08 DIAGNOSIS — K219 Gastro-esophageal reflux disease without esophagitis: Secondary | ICD-10-CM | POA: Diagnosis not present

## 2021-03-08 DIAGNOSIS — D75839 Thrombocytosis, unspecified: Secondary | ICD-10-CM

## 2021-03-08 DIAGNOSIS — I779 Disorder of arteries and arterioles, unspecified: Secondary | ICD-10-CM | POA: Diagnosis not present

## 2021-03-08 NOTE — Progress Notes (Signed)
Patient ID: Jody Wang, female   DOB: 21-Apr-1946, 75 y.o.   MRN: [id number]   Subjective:    Patient ID: Jody Wang, female    DOB: [birth year], 75 y.o.   MRN: [id number]  HPI This visit occurred during the SARS-CoV-2 public health emergency.  Safety protocols were in place, including screening questions prior to the visit, additional usage of staff PPE, and extensive cleaning of exam room while observing appropriate contact time as indicated for disinfecting solutions.  Patient here for a scheduled follow up.  Here to follow up regarding her blood sugar and cholesterol.  She reports she is doing well.  Son lives with her.  This is working out well.  Tries to stay as active as possible. Discussed labs.  Recent a1c 7.1.  Has improved.  still above goal.  Discussed low carb diet and exercise.  Also discussed medication adjustment and CGM. she declines CGM and declines additional medication at this time.  Wants to continue to work on diet and follow sugars.  No chest pain.  Breathing stable.  States am sugars averaging 130-150 and pm sugars - highest 179.  No abdominal pian.  Bowels moving.      Past Medical History:  Diagnosis Date  . Depression   . Diabetes mellitus without complication ([place])   . GERD (gastroesophageal reflux disease)   . Hypercholesterolemia   . Hypothyroidism   . Osteoarthritis of knee   . Wears dentures    partial lower   Past Surgical History:  Procedure Laterality Date  . ABDOMINAL HYSTERECTOMY  1985   ovaries not removed  . BREAST CYST EXCISION Right   . BREAST CYST EXCISION Left   . CATARACT EXTRACTION W/PHACO Right 02/16/2016   Procedure: CATARACT EXTRACTION PHACO AND INTRAOCULAR LENS PLACEMENT ([place]) RIGHT ;  Surgeon: Leandrew Koyanagi, MD;  Location: [facility];  Service: Ophthalmology;  Laterality: Right;  DIABETIC - oral meds  . CATARACT EXTRACTION W/PHACO Left 03/15/2016   Procedure: CATARACT EXTRACTION PHACO AND INTRAOCULAR LENS  PLACEMENT ([facility])left eye;  Surgeon: Leandrew Koyanagi, MD;  Location: [facility];  Service: Ophthalmology;  Laterality: Left;  DIABETIC - oral meds  . INNER EAR SURGERY     Family History  Problem Relation Age of Onset  . Diabetes Father   . Coronary artery disease Father        s/p CABG  . Hypertension Father   . Hypercholesterolemia Father   . Hypertension Brother   . Diabetes Brother   . Hypertension Sister   . Glaucoma Sister   . Diabetes Other        niece  . Breast cancer Maternal Aunt   . Breast cancer Paternal Aunt   . Cervical cancer Paternal Aunt   . Breast cancer Cousin   . Breast cancer Other    Social History   Socioeconomic History  . Marital status: Widowed    Spouse name: Not on file  . Number of children: 3  . Years of education: Not on file  . Highest education level: Not on file  Occupational History  . Not on file  Tobacco Use  . Smoking status: Former Smoker    Packs/day: 1.00    Years: 23.00    Pack years: 23.00    Quit date: 11/20/1983    Years since quitting: 37.3  . Smokeless tobacco: Never Used  Vaping Use  . Vaping Use: Never used  Substance and Sexual Activity  . Alcohol use: Yes  Alcohol/week: 0.0 standard drinks    Comment: rare  . Drug use: No  . Sexual activity: Never  Other Topics Concern  . Not on file  Social History Narrative  . Not on file   Social Determinants of Health   Financial Resource Strain: Medium Risk  . Difficulty of Paying Living Expenses: Somewhat hard  Food Insecurity: No Food Insecurity  . Worried About Charity fundraiser in the Last Year: Never true  . Ran Out of Food in the Last Year: Never true  Transportation Needs: No Transportation Needs  . Lack of Transportation (Medical): No  . Lack of Transportation (Non-Medical): No  Physical Activity: Not on file  Stress: No Stress Concern Present  . Feeling of Stress : Not at all  Social Connections: Unknown  . Frequency of Communication with  Friends and Family: More than three times a week  . Frequency of Social Gatherings with Friends and Family: More than three times a week  . Attends Religious Services: Not on file  . Active Member of Clubs or Organizations: Not on file  . Attends Archivist Meetings: Not on file  . Marital Status: Not on file    Outpatient Encounter Medications as of 03/08/2021  Medication Sig  . acetaminophen (TYLENOL) 650 MG CR tablet Take 1,300 mg by mouth in the morning and at bedtime.  Marland Kitchen aspirin 81 MG chewable tablet Chew 81 mg by mouth daily.  . blood glucose meter kit and supplies KIT Dispense based on patient and insurance preference. Use to check blood sugars twice daily. DX E11.9  . cholecalciferol (VITAMIN D) 1000 units tablet Take 1,000 Units by mouth daily.  . cyanocobalamin 1000 MCG tablet Take 1,000 mcg by mouth daily.  . diclofenac (VOLTAREN) 50 MG EC tablet Take 1 tablet (50 mg total) by mouth 2 (two) times daily as needed.  . Dulaglutide (TRULICITY) 3 ZO/1.0RU SOPN Inject into the skin.  . ferrous sulfate 325 (65 FE) MG tablet Take 1 tablet (325 mg total) by mouth daily with breakfast.  . FLUoxetine (PROZAC) 20 MG capsule TAKE 1 CAPSULE BY MOUTH  DAILY  . fluticasone (FLONASE) 50 MCG/ACT nasal spray USE 2 SPRAYS IN BOTH  NOSTRILS DAILY  . glucose blood (ONETOUCH VERIO) test strip TEST BLOOD SUGAR TWICE DAILY AS DIRECTED  . glucose blood test strip Use as instructed to check blood sugars to check blood sugars twice daily. Dx E11.9  . levocetirizine (XYZAL) 5 MG tablet Take 1 tablet (5 mg total) by mouth daily.  Marland Kitchen levothyroxine (SYNTHROID) 88 MCG tablet Take 1 tablet (88 mcg total) by mouth daily.  . metFORMIN (GLUCOPHAGE-XR) 500 MG 24 hr tablet Take 1 tablet (500 mg total) by mouth 2 (two) times daily.  . pantoprazole (PROTONIX) 40 MG tablet On tab po daily  . rosuvastatin (CRESTOR) 40 MG tablet Take 1 tablet (40 mg total) by mouth daily.  . traZODone (DESYREL) 50 MG tablet TAKE 1  TABLET BY MOUTH  DAILY   No facility-administered encounter medications on file as of 03/08/2021.    Review of Systems  Constitutional: Negative for appetite change and unexpected weight change.  HENT: Negative for congestion and sinus pressure.   Respiratory: Negative for cough, chest tightness and shortness of breath.   Cardiovascular: Negative for chest pain, palpitations and leg swelling.  Gastrointestinal: Negative for abdominal pain, diarrhea, nausea and vomiting.  Genitourinary: Negative for difficulty urinating and dysuria.  Musculoskeletal: Negative for joint swelling and myalgias.  Skin: Negative  for color change and rash.  Neurological: Negative for dizziness, light-headedness and headaches.  Psychiatric/Behavioral: Negative for agitation and dysphoric mood.       Objective:    Physical Exam Vitals reviewed.  Constitutional:      General: She is not in acute distress.    Appearance: Normal appearance.  HENT:     Head: Normocephalic and atraumatic.     Right Ear: External ear normal.     Left Ear: External ear normal.  Eyes:     General: No scleral icterus.       Right eye: No discharge.        Left eye: No discharge.     Conjunctiva/sclera: Conjunctivae normal.  Neck:     Thyroid: No thyromegaly.  Cardiovascular:     Rate and Rhythm: Normal rate and regular rhythm.  Pulmonary:     Effort: No respiratory distress.     Breath sounds: Normal breath sounds. No wheezing.  Abdominal:     General: Bowel sounds are normal.     Palpations: Abdomen is soft.     Tenderness: There is no abdominal tenderness.  Musculoskeletal:        General: No swelling or tenderness.     Cervical back: Neck supple. No tenderness.  Lymphadenopathy:     Cervical: No cervical adenopathy.  Skin:    Findings: No erythema or rash.  Neurological:     Mental Status: She is alert.  Psychiatric:        Mood and Affect: Mood normal.        Behavior: Behavior normal.     BP 116/66    Pulse 82   Temp (!) 96 F (35.6 C) (Temporal)   Resp 16   Ht 5' 1" (1.549 m)   Wt 137 lb 9.6 oz (62.4 kg)   SpO2 98%   BMI 26.00 kg/m  Wt Readings from Last 3 Encounters:  03/08/21 137 lb 9.6 oz (62.4 kg)  02/11/21 131 lb (59.4 kg)  10/04/20 138 lb 6.4 oz (62.8 kg)     Lab Results  Component Value Date   WBC 7.5 03/04/2021   HGB 13.6 03/04/2021   HCT 40.5 03/04/2021   PLT 344.0 03/04/2021   GLUCOSE 166 (H) 03/04/2021   CHOL 121 03/04/2021   TRIG 137.0 03/04/2021   HDL 39.60 03/04/2021   LDLDIRECT 188.8 06/30/2013   LDLCALC 54 03/04/2021   ALT 11 03/04/2021   AST 11 03/04/2021   NA 138 03/04/2021   K 4.5 03/04/2021   CL 104 03/04/2021   CREATININE 0.81 03/04/2021   BUN 14 03/04/2021   CO2 24 03/04/2021   TSH 3.25 02/12/2020   HGBA1C 7.1 (H) 03/04/2021   MICROALBUR <0.7 03/08/2021    MM 3D SCREEN BREAST BILATERAL  Result Date: 10/16/2020 CLINICAL DATA:  Screening. EXAM: DIGITAL SCREENING BILATERAL MAMMOGRAM WITH TOMO AND CAD COMPARISON:  Previous exam(s). ACR Breast Density Category c: The breast tissue is heterogeneously dense, which may obscure small masses. FINDINGS: There are no findings suspicious for malignancy. Images were processed with CAD. IMPRESSION: No mammographic evidence of malignancy. A result letter of this screening mammogram will be mailed directly to the patient. RECOMMENDATION: Screening mammogram in one year. (Code:SM-B-01Y) BI-RADS CATEGORY  1: Negative. Electronically Signed   By: Lovey Newcomer M.D.   On: 10/16/2020 13:29       Assessment & Plan:   Problem List Items Addressed This Visit    Abnormal CT of the chest  Previously saw Dr Genevive Bi.  Overdue f/u.  Again discussed today.  She will notify me when agreeable.        Anemia    With IDA.  On iron.  Have discussed GI evaluation.  cologuard 02/2019- negative.  Has declined any further GI w/up.       Relevant Orders   CBC with Differential/Platelet   Ferritin   Aortic atherosclerosis  ([place])    Continue crestor.       Carotid artery disease ([place])    Previous left carotid 60-79% and right 40-50%.  Continue crestor.  Needs f/u with [facility] to monitor.       GERD (gastroesophageal reflux disease)    Continue protonix.  No upper symptoms reported.       Hypercholesterolemia    Continue crestor.  Low cholesterol diet and exercise.  Follow lipid panel and liver function tests.       Relevant Orders   Hepatic function panel   Lipid panel   Hypothyroidism    On thyroid replacement.  Follow tsh.       Relevant Orders   TSH   Mild depression ([place])    Overall appears to be doing well and handling stress.  Continue prozac.  Follow.        Thrombocytosis    Follow cbc.       Type 2 diabetes mellitus with hyperglycemia (HCC) - Primary    Continues trulicity.  a1c 7.1.  Has improved.  Discussed.  She declines additional medication.  Wants to work on low carb diet and exercise.  Follow met b and a1c.       Relevant Orders   Microalbumin / creatinine urine ratio (Completed)   Hemoglobin I5O   Basic metabolic panel   Microalbumin / creatinine urine ratio       Einar Pheasant, MD

## 2021-03-09 LAB — MICROALBUMIN / CREATININE URINE RATIO
Creatinine,U: 23 mg/dL
Microalb Creat Ratio: 3.1 mg/g (ref 0.0–30.0)
Microalb, Ur: <0.7 mg/dL (ref 0.0–1.9)

## 2021-03-13 ENCOUNTER — Encounter: Payer: Self-pay | Admitting: Internal Medicine

## 2021-03-13 NOTE — Assessment & Plan Note (Signed)
Continue crestor 

## 2021-03-13 NOTE — Assessment & Plan Note (Signed)
Continue crestor.  Low cholesterol diet and exercise. Follow lipid panel and liver function tests.   

## 2021-03-13 NOTE — Assessment & Plan Note (Signed)
Previously saw Dr Genevive Bi.  Overdue f/u.  Again discussed today.  She will notify me when agreeable.

## 2021-03-13 NOTE — Assessment & Plan Note (Addendum)
Previous left carotid 60-79% and right 40-50%.  Continue crestor.  Needs f/u with [facility] to monitor.

## 2021-03-13 NOTE — Assessment & Plan Note (Signed)
Continues trulicity.  a1c 7.1.  Has improved.  Discussed.  She declines additional medication.  Wants to work on low carb diet and exercise.  Follow met b and a1c.

## 2021-03-13 NOTE — Assessment & Plan Note (Signed)
On thyroid replacement.  Follow tsh.  

## 2021-03-13 NOTE — Assessment & Plan Note (Signed)
Overall appears to be doing well and handling stress.  Continue prozac.  Follow.   

## 2021-03-13 NOTE — Assessment & Plan Note (Signed)
Follow cbc.  

## 2021-03-13 NOTE — Assessment & Plan Note (Signed)
With IDA.  On iron.  Have discussed GI evaluation.  cologuard 02/2019- negative.  Has declined any further GI w/up.  

## 2021-03-13 NOTE — Assessment & Plan Note (Signed)
Continue protonix.  No upper symptoms reported.  

## 2021-03-14 ENCOUNTER — Telehealth: Payer: Self-pay | Admitting: Internal Medicine

## 2021-03-14 NOTE — Telephone Encounter (Signed)
   Telephone encounter was:  Unsuccessful.  03/14/2021 Name: Jody Wang MRN: [id number] DOB: August 16, 1946  Unsuccessful outbound call made today to assist with:  Food Insecurity and Financial Difficulties related to utility bills  Outreach Attempt:  2nd Attempt  A HIPAA compliant voice message was left requesting a return call.  Instructed patient to call back at [phone].  [place], Care Management Phone: [phone] Email: [email or web address]

## 2021-03-15 ENCOUNTER — Telehealth: Payer: Self-pay | Admitting: Internal Medicine

## 2021-03-15 NOTE — Telephone Encounter (Signed)
Patient called in wanted Dr.Scott to call her something in for arthritis pain

## 2021-03-16 NOTE — Telephone Encounter (Signed)
Patient is going to try using tylenol first and then call me if no improvement so she can be re-evaluated.

## 2021-03-22 ENCOUNTER — Telehealth: Payer: Self-pay | Admitting: Internal Medicine

## 2021-03-22 NOTE — Telephone Encounter (Signed)
   Telephone encounter was:  Successful.  03/22/2021 Name: Jody Wang MRN: [id number] DOB: 1946/09/21  Jody Wang is a 75 y.o. year old female who is a primary care patient of Einar Pheasant, MD . The community resource team was consulted for assistance with Food Insecurity and Financial Difficulties related to utilities  Care guide performed the following interventions: Patient provided with information about care guide support team and interviewed to confirm resource needs Discussed resources to assist with with [facility] for a food card in the amount of $100 and then sent her an email with a listing of local food banks in her area as well as a couple of resources to potentially help her with her utility bills. She has maxed our [employer] of $500.  Obtained verbal consent to place patient referral to [facility] for a $100 grocery gift card to [employer]. .  Follow Up Plan:  No further follow up planned at this time. The patient has been provided with needed resources.  [place], Care Management Phone: [phone] Email: [email or web address]

## 2021-03-24 DIAGNOSIS — H6123 Impacted cerumen, bilateral: Secondary | ICD-10-CM | POA: Diagnosis not present

## 2021-03-24 DIAGNOSIS — H90A32 Mixed conductive and sensorineural hearing loss, unilateral, left ear with restricted hearing on the contralateral side: Secondary | ICD-10-CM | POA: Diagnosis not present

## 2021-03-24 DIAGNOSIS — J301 Allergic rhinitis due to pollen: Secondary | ICD-10-CM | POA: Diagnosis not present

## 2021-03-29 ENCOUNTER — Telehealth: Payer: Self-pay | Admitting: Internal Medicine

## 2021-03-29 NOTE — Telephone Encounter (Signed)
   Telephone encounter was:  Unsuccessful.  03/29/2021 Name: Jody Wang MRN: [id number] DOB: Nov 28, 1945  Unsuccessful outbound call made today to assist with:  Food Insecurity  Outreach Attempt:  1st Attempt  The [facility] gift card for $100 to [employer] was sent to [place]. [place] and should have been sent to [facility] [address] Lorina Rabon. I have left a message for the pt to call me back to see how we can rectify this situation. A HIPAA compliant voice message was left requesting a return call.  Instructed patient to call back at [phone].  [place], Care Management Phone: [phone] Email: [email or web address]

## 2021-03-29 NOTE — Telephone Encounter (Signed)
[place] care management [place] sent the gift card to wrong location in Jody Wang

## 2021-04-06 ENCOUNTER — Telehealth: Payer: Self-pay | Admitting: Internal Medicine

## 2021-04-06 NOTE — Telephone Encounter (Signed)
Pt called and said that [facility] didn't have refills on the pantoprazole (PROTONIX) 40 MG tablet.

## 2021-04-07 MED ORDER — PANTOPRAZOLE SODIUM 40 MG PO TBEC: DELAYED_RELEASE_TABLET | ORAL | 1 refills | Status: DC

## 2021-04-07 NOTE — Addendum Note (Signed)
Addended by: Elpidio Galea T on: 04/07/2021 08:44 AM   Modules accepted: Orders

## 2021-04-08 ENCOUNTER — Telehealth: Payer: Self-pay | Admitting: Internal Medicine

## 2021-04-08 NOTE — Telephone Encounter (Signed)
   Telephone encounter was:  Successful.  04/08/2021 Name: Jody Wang MRN: [id number] DOB: Nov 07, 1945  Jody Wang is a 75 y.o. year old female who is a primary care patient of Einar Pheasant, MD . The community resource team was consulted for assistance with [place] guide performed the following interventions: spoke to son and explained that we sent pt's gocery gift card from [facility] to [facility] and not [facility]. He said that it was fine and he will go in a few minutes to the office and get it for his mother. .  Follow Up Plan:  Client will call me back to confirm receipt of card. and No further follow up planned at this time. The patient has been provided with needed resources.  UPDATE: [place] and pt's son Jody Wang picked up the card for his mother.   [place], Care Management Phone: [phone] Email: [email or web address]

## 2021-04-12 ENCOUNTER — Other Ambulatory Visit: Payer: Self-pay

## 2021-04-12 ENCOUNTER — Telehealth: Payer: Self-pay | Admitting: Internal Medicine

## 2021-04-12 ENCOUNTER — Ambulatory Visit (INDEPENDENT_AMBULATORY_CARE_PROVIDER_SITE_OTHER): Payer: Medicare Other | Admitting: Pharmacist

## 2021-04-12 DIAGNOSIS — I700 Atherosclerosis of aorta: Secondary | ICD-10-CM

## 2021-04-12 DIAGNOSIS — E039 Hypothyroidism, unspecified: Secondary | ICD-10-CM

## 2021-04-12 DIAGNOSIS — E7800 Pure hypercholesterolemia, unspecified: Secondary | ICD-10-CM | POA: Diagnosis not present

## 2021-04-12 DIAGNOSIS — F320 Major depressive disorder, single episode, mild: Secondary | ICD-10-CM | POA: Diagnosis not present

## 2021-04-12 DIAGNOSIS — E1165 Type 2 diabetes mellitus with hyperglycemia: Secondary | ICD-10-CM

## 2021-04-12 DIAGNOSIS — I779 Disorder of arteries and arterioles, unspecified: Secondary | ICD-10-CM

## 2021-04-12 DIAGNOSIS — F32A Depression, unspecified: Secondary | ICD-10-CM

## 2021-04-12 MED ORDER — PANTOPRAZOLE SODIUM 40 MG PO TBEC: DELAYED_RELEASE_TABLET | ORAL | 1 refills | Status: DC

## 2021-04-12 NOTE — Patient Instructions (Signed)
Visit Information  PATIENT GOALS: Goals Addressed              This Visit's Progress     Patient Stated   .  Medication Monitoring (pt-stated)        Patient Goals/Self-Care Activities . Over the next 90 days, patient will:  - take medications as prescribed check glucose daily, document, and provide at future appointments collaborate with provider on medication access solutions         Patient verbalizes understanding of instructions provided today and agrees to view in [place].   Plan: Telephone follow up appointment with care management team member scheduled for:  ~ 12 weeks  Catie Darnelle Maffucci, PharmD, [place], [place] Clinical Pharmacist [employer] at [employer] [phone]

## 2021-04-12 NOTE — Telephone Encounter (Signed)
Spoke with patient for CCM visit. I confirmed with her that she has been taking 1 tablet daily, not 2.

## 2021-04-12 NOTE — Telephone Encounter (Signed)
I have sent in a new prescription to [facility] and corrected directions and the quantity.

## 2021-04-12 NOTE — Chronic Care Management (AMB) (Signed)
Chronic Care Management Pharmacy Note  04/12/2021 Name:  Jody Wang MRN:  [id number] DOB:  [birth year]  Subjective: Jody Wang is an 75 y.o. year old female who is a primary patient of Einar Pheasant, MD.  The CCM team was consulted for assistance with disease management and care coordination needs.    Engaged with patient by telephone for follow up visit in response to provider referral for pharmacy case management and/or care coordination services.   Consent to Services:  The patient was given information about Chronic Care Management services, agreed to services, and gave verbal consent prior to initiation of services.  Please see initial visit note for detailed documentation.   Patient Care Team: Einar Pheasant, MD as PCP - General (Internal Medicine) Minna Merritts, MD as Consulting Physician (Cardiology) De Hollingshead, RPH-CPP as Pharmacist (Pharmacist)  Recent office visits:  5/3- PCP- A1c 7.1%- due for [facility] f/u, Vascular f/u  Objective:  Lab Results  Component Value Date   CREATININE 0.81 03/04/2021   CREATININE 0.88 09/27/2020   CREATININE 0.86 05/27/2020    Lab Results  Component Value Date   HGBA1C 7.1 (H) 03/04/2021   Last diabetic Eye exam:  Lab Results  Component Value Date/Time   HMDIABEYEEXA No Retinopathy 07/05/2020 12:00 AM    Last diabetic Foot exam:  Lab Results  Component Value Date/Time   HMDIABFOOTEX on my exam 04/09/2018 12:00 AM        Component Value Date/Time   CHOL 121 03/04/2021 1058   TRIG 137.0 03/04/2021 1058   HDL 39.60 03/04/2021 1058   CHOLHDL 3 03/04/2021 1058   VLDL 27.4 03/04/2021 1058   LDLCALC 54 03/04/2021 1058   LDLDIRECT 188.8 06/30/2013 1131    Hepatic Function Latest Ref Rng & Units 03/04/2021 09/27/2020 05/27/2020  Total Protein 6.0 - 8.3 g/dL 7.4 7.4 7.3  Albumin 3.5 - 5.2 g/dL 4.4 4.5 4.4  AST 0 - 37 U/L 11 11 12   ALT 0 - 35 U/L 11 9 11   Alk Phosphatase 39 - 117 U/L 77 72 70   Total Bilirubin 0.2 - 1.2 mg/dL 0.3 0.3 0.3  Bilirubin, Direct 0.0 - 0.3 mg/dL 0.1 0.1 0.1    Lab Results  Component Value Date/Time   TSH 3.25 02/12/2020 09:43 AM   TSH 1.96 12/27/2016 10:02 AM    CBC Latest Ref Rng & Units 03/04/2021 09/27/2020 07/26/2020  WBC 4.0 - 10.5 K/uL 7.5 8.0 11.3(H)  Hemoglobin 12.0 - 15.0 g/dL 13.6 13.7 12.1  Hematocrit 36.0 - 46.0 % 40.5 41.0 37.7  Platelets 150.0 - 400.0 K/uL 344.0 372.0 352.0    Clinical ASCVD: Yes  The ASCVD Risk score Mikey Bussing DC Jr., et al., 2013) failed to calculate for the following reasons:   The valid total cholesterol range is 130 to 320 mg/dL     Social History   Tobacco Use  Smoking Status Former Smoker  . Packs/day: 1.00  . Years: 23.00  . Pack years: 23.00  . Quit date: 11/20/1983  . Years since quitting: 37.4  Smokeless Tobacco Never Used   BP Readings from Last 3 Encounters:  03/08/21 116/66  10/04/20 120/70  06/01/20 118/70   Pulse Readings from Last 3 Encounters:  03/08/21 82  10/04/20 90  06/01/20 84   Wt Readings from Last 3 Encounters:  03/08/21 137 lb 9.6 oz (62.4 kg)  02/11/21 131 lb (59.4 kg)  10/04/20 138 lb 6.4 oz (62.8 kg)    Assessment: Review of patient  past medical history, allergies, medications, health status, including review of consultants reports, laboratory and other test data, was performed as part of comprehensive evaluation and provision of chronic care management services.   SDOH:  (Social Determinants of Health) assessments and interventions performed:  SDOH Interventions   Flowsheet Row Most Recent Value  SDOH Interventions   Financial Strain Interventions Other (Comment)  [patient assistance]      CCM Care Plan  Allergies  Allergen Reactions  . Codeine Shortness Of Breath    stops breathing  . Meperidine And Related Shortness Of Breath    Stops breathing  . Oxycontin [Oxycodone Hcl] Shortness Of Breath    Stops breathing  . Jardiance [Empagliflozin] Other (See  Comments)    Yeast infections  . Tape Rash    Paper tape ok    Medications Reviewed Today    Reviewed by De Hollingshead, RPH-CPP (Pharmacist) on 04/12/21 at 52  Med List Status: <None>  Medication Order Taking? Sig Documenting Provider Last Dose Status Informant  acetaminophen (TYLENOL) 650 MG CR tablet [id number] Yes Take 1,300 mg by mouth 2 (two) times daily. [provider] Taking Active   aspirin 81 MG chewable tablet [id number] Yes Chew 81 mg by mouth daily. [provider] Taking Active   blood glucose meter kit and supplies KIT [id number]  Dispense based on patient and insurance preference. Use to check blood sugars twice daily. DX E11.9 Einar Pheasant, MD  Active   cholecalciferol (VITAMIN D) 1000 units tablet [id number] Yes Take 1,000 Units by mouth daily. [provider] Taking Active   cyanocobalamin 1000 MCG tablet [id number] Yes Take 1,000 mcg by mouth daily. [provider] Taking Active Self  diclofenac (VOLTAREN) 50 MG EC tablet [id number] Yes Take 1 tablet (50 mg total) by mouth 2 (two) times daily as needed. Einar Pheasant, MD Taking Active   Dulaglutide (TRULICITY) 3 QM/2.5OI Bonney Aid [id number] Yes Inject 3 mg into the skin once a week. [provider] Taking Active   ferrous sulfate 325 (65 FE) MG tablet [id number] Yes Take 1 tablet (325 mg total) by mouth daily with breakfast. Einar Pheasant, MD Taking Active            Med Note Nat Christen Feb 03, 2021  1:07 PM)    FLUoxetine (PROZAC) 20 MG capsule [id number] Yes TAKE 1 CAPSULE BY MOUTH  DAILY Einar Pheasant, MD Taking Active   fluticasone (FLONASE) 50 MCG/ACT nasal spray [id number] Yes USE 2 SPRAYS IN BOTH  NOSTRILS DAILY Einar Pheasant, MD Taking Active   glucose blood (ONETOUCH VERIO) test strip [id number] Yes TEST BLOOD SUGAR TWICE DAILY AS DIRECTED Einar Pheasant, MD Taking Active   glucose blood test strip [id number] Yes Use as instructed to check blood sugars  to check blood sugars twice daily. Dx E11.9 Einar Pheasant, MD Taking Active   levocetirizine (XYZAL) 5 MG tablet [id number] Yes Take 1 tablet (5 mg total) by mouth daily. Einar Pheasant, MD Taking Active   levothyroxine (SYNTHROID) 88 MCG tablet [id number] Yes Take 1 tablet (88 mcg total) by mouth daily. Einar Pheasant, MD Taking Active   metFORMIN (GLUCOPHAGE-XR) 500 MG 24 hr tablet [id number] Yes Take 1 tablet (500 mg total) by mouth 2 (two) times daily. Einar Pheasant, MD Taking Active            Med Note Nat Christen Feb 03, 2021  1:04 PM) 1000 mg daily   pantoprazole (PROTONIX) 40 MG tablet [id number]  Yes On tab po daily Einar Pheasant, MD Taking Active   rosuvastatin (CRESTOR) 40 MG tablet [id number] Yes Take 1 tablet (40 mg total) by mouth daily. Einar Pheasant, MD Taking Active   traZODone (DESYREL) 50 MG tablet [id number] Yes TAKE 1 TABLET BY MOUTH  DAILY Einar Pheasant, MD Taking Active           Patient Active Problem List   Diagnosis Date Noted  . Memory change 10/11/2020  . Carotid bruit 06/06/2020  . Anemia 02/22/2020  . Cough 01/19/2020  . Thrombocytosis 04/14/2018  . Lung nodule, multiple 03/14/2018  . AK (actinic keratosis) 03/14/2018  . Aortic atherosclerosis ([place]) 05/14/2017  . Coronary artery disease of native artery of native heart with stable angina pectoris ([place]) 05/14/2017  . Abnormal CT of the chest 02/11/2017  . SOB (shortness of breath) on exertion 02/01/2017  . Chest pain 01/28/2017  . Primary osteoarthritis of right knee 08/29/2016  . Shingles 07/02/2016  . Knee pain 07/11/2015  . Health care maintenance 02/27/2015  . Carotid artery disease ([place]) 02/27/2015  . Cerumen impaction 06/21/2014  . Weight loss 04/12/2014  . Environmental allergies 04/12/2014  . Right shoulder pain 10/08/2013  . GERD (gastroesophageal reflux disease) 10/09/2012  . Mild depression ([place]) 10/08/2012  . Hypercholesterolemia 10/08/2012  . Hypothyroidism  10/08/2012  . Type 2 diabetes mellitus with hyperglycemia ([place]) 10/08/2012    Immunization History  Administered Date(s) Administered  . Influenza Split 08/24/2013, 08/20/2014  . Influenza, High Dose Seasonal PF 09/25/2016, 07/11/2018, 08/26/2019, 08/16/2020  . Influenza,inj,Quad PF,6+ Mos 07/09/2015  . Influenza-Unspecified 08/10/2017  . PFIZER(Purple Top)SARS-COV-2 Vaccination 01/02/2020, 01/28/2020, 08/06/2020  . Pneumococcal Conjugate-13 10/08/2015  . Pneumococcal Polysaccharide-23 07/01/2013  . Zoster, Live 08/24/2013    Conditions to be addressed/monitored: HLD and DMII  Care Plan : Medication Management  Updates made by De Hollingshead, RPH-CPP since 04/12/2021 12:00 AM    Problem: Diabetes, HLD     Long-Range Goal: Disease Progression Prevention   Start Date: 12/03/2020  This Visit's Progress: On track  Recent Progress: On track  Priority: High  Note:   Current Barriers:  . Unable to independently afford treatment regimen . Unable to achieve control of diabetes   Pharmacist Clinical Goal(s):  Marland Kitchen Over the next 90 days, patient will verbalize ability to afford treatment regimen. . Over the next 90 days, patient will achieve control of diabetes as evidenced by improvement in A1cthrough collaboration with PharmD and provider.   Interventions: . 1:1 collaboration with Einar Pheasant, MD regarding development and update of comprehensive plan of care as evidenced by provider attestation and co-signature . Inter-disciplinary care team collaboration (see longitudinal plan of care) . Comprehensive medication review performed; medication list updated in electronic medical record  Health Maintenance: . DUE for foot exam . Has a hearing aid appointment coming up, dental appointment today . Notes she should be able to afford preventative cardiac imaging in ~ December, as her last car payment will be in November.   Diabetes: . Improved and at a more relaxed goal of <7.5%  given age, desire to avoid additional medications; current treatment: Trulicity 3 mg weekly, metformin XR 1000 mg daily (dose limited by GI upset)  o Hx yeast infections with Jardiance . Current glucose readings: fastings 130-150s  . Approved for [employer] through 2022. . Current meal patterns: dog wakes her up earlier; lunch: 1-2 pm, supper 7- pm; steamed vegetables, cauliflower, carrots; eats a lot of fruit - strawberries, blackberries, bananas, watermelon; snacks: pecans; has  been eating carbs before bed to prevent hypoglycemia; drinks: fruit juices, coke zero;  . Current physical activity: limited by knee arthritis. Previously discussed water aerobics, which patient liked previously, but notes now that they don't keep the pool warm enough for her. Has a dog now that keeps her more active.  . Continue current regimen at this time. Continue to focus on lifestyle modifications.   Hyperlipidemia and ASCVD risk reduction . Controlled per last lipid panel; current treatment: rosuvastatin 40 mg daily;  . Antiplatelet therapy: aspirin 81 mg daily  . Fill history up to date . Recommended to continue current treatment regimen.    Depression with Insomnia: . Controlled per patient report; current treatment: fluoxetine 20 mg daily, trazodone 50 mg QPM.  . Recommended to continue current regimen at this time.    Hypothyroidism: . Controlled per last TSH; current treatment regimen: levothyroxine 88 mcg daily . Confirmed appropriate administration . Recommended to continue current treatment regimen at this time  GERD: . Controlled per patient report; current treatment: pantoprazole 40 mg daily . Denies any concerns with acid reflux with once daily administration.  . Recommended to continue current treatment regimen at this time. Notified refill team.   Osteoarthritis: . Moderately well controlled; diclofenac 50 mg BID (taking BID regularly in the winter as cold exacerbates pain); acetaminophen  1300 mg BID . Recommended to continue current regimen at this time with renal function monitoring. Continue PPI for GI protection due to chronic NSAID use.   Supplement: Marland Kitchen Vitamin D, Vitamin B12, ferrous sulfate 325 mg daily . Notes she sometimes forgets these medications because they can upset her stomach if she takes on an empty stomach. Discussed benefit of each agent and encouraged adherence.   Patient Goals/Self-Care Activities . Over the next 90 days, patient will:  - take medications as prescribed check glucose daily, document, and provide at future appointments collaborate with provider on medication access solutions  Follow Up Plan: Telephone follow up appointment with care management team member scheduled for: ~ 12 weeks      Medication Assistance: Trulicity obtained through [employer] medication assistance program.  Enrollment ends 11/05/21  Patient's preferred pharmacy is:  [facility] [place], [address] [place] [address] Phone: [phone] Fax: [phone]  [facility]  ([place], [address], [address] [place], [place] [address] Phone: [phone] Fax: [phone]  Follow Up:  Patient agrees to Care Plan and Follow-up.  Plan: Telephone follow up appointment with care management team member scheduled for:  ~ 12 weeks  Catie Darnelle Maffucci, PharmD, [place], [place] Clinical Pharmacist [employer] at [employer] [phone]

## 2021-04-12 NOTE — Telephone Encounter (Signed)
Jody Wang from [facility] would like to get some clarification on the directions for pantoprazole (PROTONIX) 40 MG tablet. She can be reach at [phone] with Ref # [id number].

## 2021-05-16 ENCOUNTER — Telehealth: Payer: Self-pay | Admitting: Pharmacist

## 2021-05-16 NOTE — Telephone Encounter (Signed)
  Chronic Care Management   Note  05/16/2021 Name: Jody Wang MRN: [id number] DOB: December 05, 1945   Attempted to contact patient to follow up paperwork we received from [employer] regarding testing supplies. Left voicemail asking her to return my call at her convenience.  Catie Darnelle Maffucci, PharmD, [place], [place] Clinical Pharmacist [employer] at [place]

## 2021-05-16 NOTE — Telephone Encounter (Signed)
Received return call. She is using [employer] for glucometer supplies. Will collaborate w/ clinical staff to complete DWO and fax in.

## 2021-05-17 ENCOUNTER — Telehealth: Payer: Self-pay

## 2021-05-17 NOTE — Telephone Encounter (Signed)
Paperwork for Diabetes Testing Supplies from [employer] has been received and signed. The paperwork has been faxed to number provided on form - [phone]

## 2021-05-22 ENCOUNTER — Other Ambulatory Visit: Payer: Self-pay | Admitting: Internal Medicine

## 2021-05-22 DIAGNOSIS — Z760 Encounter for issue of repeat prescription: Secondary | ICD-10-CM

## 2021-06-22 ENCOUNTER — Ambulatory Visit (INDEPENDENT_AMBULATORY_CARE_PROVIDER_SITE_OTHER): Payer: Medicare Other | Admitting: Pharmacist

## 2021-06-22 DIAGNOSIS — F32A Depression, unspecified: Secondary | ICD-10-CM

## 2021-06-22 DIAGNOSIS — E7800 Pure hypercholesterolemia, unspecified: Secondary | ICD-10-CM

## 2021-06-22 DIAGNOSIS — I779 Disorder of arteries and arterioles, unspecified: Secondary | ICD-10-CM

## 2021-06-22 DIAGNOSIS — F320 Major depressive disorder, single episode, mild: Secondary | ICD-10-CM | POA: Diagnosis not present

## 2021-06-22 DIAGNOSIS — E1165 Type 2 diabetes mellitus with hyperglycemia: Secondary | ICD-10-CM | POA: Diagnosis not present

## 2021-06-22 DIAGNOSIS — E039 Hypothyroidism, unspecified: Secondary | ICD-10-CM

## 2021-06-22 NOTE — Patient Instructions (Signed)
Visit Information  PATIENT GOALS:  Goals Addressed               This Visit's Progress     Patient Stated     Medication Monitoring (pt-stated)        Patient Goals/Self-Care Activities Over the next 90 days, patient will:  - take medications as prescribed check glucose daily, document, and provide at future appointments collaborate with provider on medication access solutions           Patient verbalizes understanding of instructions provided today and agrees to view in [place].   Plan: Telephone follow up appointment with care management team member scheduled for:  ~ 12 weeks  Catie Darnelle Maffucci, PharmD, [place], [place] Clinical Pharmacist [employer] at [employer] [phone]

## 2021-06-22 NOTE — Chronic Care Management (AMB) (Signed)
Chronic Care Management Pharmacy Note  06/22/2021 Name:  Jody Wang MRN:  [id number] DOB:  19-Feb-1946   Subjective: SMRITI BARKOW is an 75 y.o. year old female who is a primary patient of Einar Pheasant, MD.  The CCM team was consulted for assistance with disease management and care coordination needs.    Engaged with patient by telephone for follow up visit in response to provider referral for pharmacy case management and/or care coordination services.   Consent to Services:  The patient was given information about Chronic Care Management services, agreed to services, and gave verbal consent prior to initiation of services.  Please see initial visit note for detailed documentation.   Patient Care Team: Einar Pheasant, MD as PCP - General (Internal Medicine) Minna Merritts, MD as Consulting Physician (Cardiology) De Hollingshead, RPH-CPP as Pharmacist (Pharmacist)   Objective:  Lab Results  Component Value Date   CREATININE 0.81 03/04/2021   CREATININE 0.88 09/27/2020   CREATININE 0.86 05/27/2020    Lab Results  Component Value Date   HGBA1C 7.1 (H) 03/04/2021   Last diabetic Eye exam:  Lab Results  Component Value Date/Time   HMDIABEYEEXA No Retinopathy 07/05/2020 12:00 AM    Last diabetic Foot exam:  Lab Results  Component Value Date/Time   HMDIABFOOTEX on my exam 04/09/2018 12:00 AM        Component Value Date/Time   CHOL 121 03/04/2021 1058   TRIG 137.0 03/04/2021 1058   HDL 39.60 03/04/2021 1058   CHOLHDL 3 03/04/2021 1058   VLDL 27.4 03/04/2021 1058   LDLCALC 54 03/04/2021 1058   LDLDIRECT 188.8 06/30/2013 1131    Hepatic Function Latest Ref Rng & Units 03/04/2021 09/27/2020 05/27/2020  Total Protein 6.0 - 8.3 g/dL 7.4 7.4 7.3  Albumin 3.5 - 5.2 g/dL 4.4 4.5 4.4  AST 0 - 37 U/L 11 11 12   ALT 0 - 35 U/L 11 9 11   Alk Phosphatase 39 - 117 U/L 77 72 70  Total Bilirubin 0.2 - 1.2 mg/dL 0.3 0.3 0.3  Bilirubin, Direct 0.0 - 0.3  mg/dL 0.1 0.1 0.1    Lab Results  Component Value Date/Time   TSH 3.25 02/12/2020 09:43 AM   TSH 1.96 12/27/2016 10:02 AM    CBC Latest Ref Rng & Units 03/04/2021 09/27/2020 07/26/2020  WBC 4.0 - 10.5 K/uL 7.5 8.0 11.3(H)  Hemoglobin 12.0 - 15.0 g/dL 13.6 13.7 12.1  Hematocrit 36.0 - 46.0 % 40.5 41.0 37.7  Platelets 150.0 - 400.0 K/uL 344.0 372.0 352.0    No results found for: VD25OH  Clinical ASCVD: No  The ASCVD Risk score Mikey Bussing DC Jr., et al., 2013) failed to calculate for the following reasons:   The valid total cholesterol range is 130 to 320 mg/dL     Social History   Tobacco Use  Smoking Status Former   Packs/day: 1.00   Years: 23.00   Pack years: 23.00   Types: Cigarettes   Quit date: 11/20/1983   Years since quitting: 37.6  Smokeless Tobacco Never   BP Readings from Last 3 Encounters:  03/08/21 116/66  10/04/20 120/70  06/01/20 118/70   Pulse Readings from Last 3 Encounters:  03/08/21 82  10/04/20 90  06/01/20 84   Wt Readings from Last 3 Encounters:  03/08/21 137 lb 9.6 oz (62.4 kg)  02/11/21 131 lb (59.4 kg)  10/04/20 138 lb 6.4 oz (62.8 kg)    Assessment: Review of patient past medical history, allergies, medications,  health status, including review of consultants reports, laboratory and other test data, was performed as part of comprehensive evaluation and provision of chronic care management services.   SDOH:  (Social Determinants of Health) assessments and interventions performed:  SDOH Interventions    Flowsheet Row Most Recent Value  SDOH Interventions   Financial Strain Interventions Other (Comment)  [manufacturer assistance]       CCM Care Plan  Allergies  Allergen Reactions   Codeine Shortness Of Breath    stops breathing   Meperidine And Related Shortness Of Breath    Stops breathing   Oxycontin [Oxycodone Hcl] Shortness Of Breath    Stops breathing   Jardiance [Empagliflozin] Other (See Comments)    Yeast infections   Tape  Rash    Paper tape ok    Medications Reviewed Today     Reviewed by De Hollingshead, RPH-CPP (Pharmacist) on 06/22/21 at [place] List Status: <None>   Medication Order Taking? Sig Documenting Provider Last Dose Status Informant  acetaminophen (TYLENOL) 500 MG tablet [id number] Yes Take 1,000 mg by mouth in the morning, at noon, and at bedtime. [provider] Taking Active   aspirin 81 MG chewable tablet [id number] Yes Chew 81 mg by mouth daily. [provider] Taking Active   blood glucose meter kit and supplies KIT [id number] Yes Dispense based on patient and insurance preference. Use to check blood sugars twice daily. DX E11.9 Einar Pheasant, MD Taking Active   cholecalciferol (VITAMIN D) 1000 units tablet [id number] Yes Take 1,000 Units by mouth daily. [provider] Taking Active   diclofenac (VOLTAREN) 50 MG EC tablet [id number] Yes TAKE 1 TABLET BY MOUTH  TWICE DAILY AS NEEDED Einar Pheasant, MD Taking Active   Dulaglutide (TRULICITY) 3 MC/9.4BS SOPN [id number] Yes Inject 3 mg into the skin once a week. [provider] Taking Active   FLUoxetine (PROZAC) 20 MG capsule [id number] Yes TAKE 1 CAPSULE BY MOUTH  DAILY Einar Pheasant, MD Taking Active   fluticasone (FLONASE) 50 MCG/ACT nasal spray [id number] Yes USE 2 SPRAYS IN BOTH  NOSTRILS DAILY Einar Pheasant, MD Taking Active   glucose blood [facility] VERIO) test strip [id number] Yes TEST BLOOD SUGAR TWICE DAILY AS DIRECTED Einar Pheasant, MD Taking Active   glucose blood test strip [id number] Yes Use as instructed to check blood sugars to check blood sugars twice daily. Dx E11.9 Einar Pheasant, MD Taking Active   levocetirizine (XYZAL) 5 MG tablet [id number] Yes Take 1 tablet (5 mg total) by mouth daily. Einar Pheasant, MD Taking Active   levothyroxine (SYNTHROID) 88 MCG tablet [id number] Yes Take 1 tablet (88 mcg total) by mouth daily. Einar Pheasant, MD Taking Active   metFORMIN (GLUCOPHAGE-XR) 500  MG 24 hr tablet [id number] Yes Take 1 tablet (500 mg total) by mouth 2 (two) times daily. Einar Pheasant, MD Taking Active            Med Note Nat Christen Feb 03, 2021  1:04 PM) 1000 mg daily   pantoprazole (PROTONIX) 40 MG tablet [id number] Yes One tab po daily Einar Pheasant, MD Taking Active   rosuvastatin (CRESTOR) 40 MG tablet [id number] Yes Take 1 tablet (40 mg total) by mouth daily. Einar Pheasant, MD Taking Active   traZODone (DESYREL) 50 MG tablet [id number] Yes TAKE 1 TABLET BY MOUTH  DAILY Einar Pheasant, MD Taking Active             Patient Active Problem List   Diagnosis Date  Noted   Memory change 10/11/2020   Carotid bruit 06/06/2020   Anemia 02/22/2020   Cough 01/19/2020   Thrombocytosis 04/14/2018   Lung nodule, multiple 03/14/2018   AK (actinic keratosis) 03/14/2018   Aortic atherosclerosis ([place]) 05/14/2017   Coronary artery disease of native artery of native heart with stable angina pectoris ([place]) 05/14/2017   Abnormal CT of the chest 02/11/2017   SOB (shortness of breath) on exertion 02/01/2017   Chest pain 01/28/2017   Primary osteoarthritis of right knee 08/29/2016   Shingles 07/02/2016   Knee pain 07/11/2015   Health care maintenance 02/27/2015   Carotid artery disease ([place]) 02/27/2015   Cerumen impaction 06/21/2014   Weight loss 04/12/2014   Environmental allergies 04/12/2014   Right shoulder pain 10/08/2013   GERD (gastroesophageal reflux disease) 10/09/2012   Mild depression ([place]) 10/08/2012   Hypercholesterolemia 10/08/2012   Hypothyroidism 10/08/2012   Type 2 diabetes mellitus with hyperglycemia ([place]) 10/08/2012    Immunization History  Administered Date(s) Administered   Influenza Split 08/24/2013, 08/20/2014   Influenza, High Dose Seasonal PF 09/25/2016, 07/11/2018, 08/26/2019, 08/16/2020   Influenza,inj,Quad PF,6+ Mos 07/09/2015   Influenza-Unspecified 08/10/2017   PFIZER(Purple Top)SARS-COV-2 Vaccination 01/02/2020,  01/28/2020, 08/06/2020   Pneumococcal Conjugate-13 10/08/2015   Pneumococcal Polysaccharide-23 07/01/2013   Zoster, Live 08/24/2013    Conditions to be addressed/monitored: HLD and DMII  Care Plan : Medication Management  Updates made by De Hollingshead, RPH-CPP since 06/22/2021 12:00 AM     Problem: Diabetes, HLD      Long-Range Goal: Disease Progression Prevention   Start Date: 12/03/2020  Recent Progress: On track  Priority: High  Note:   Current Barriers:  Unable to independently afford treatment regimen Unable to achieve control of diabetes   Pharmacist Clinical Goal(s):  Over the next 90 days, patient will verbalize ability to afford treatment regimen. Over the next 90 days, patient will achieve control of diabetes as evidenced by improvement in [place] collaboration with PharmD and provider.   Interventions: 1:1 collaboration with Einar Pheasant, MD regarding development and update of comprehensive plan of care as evidenced by provider attestation and co-signature Inter-disciplinary care team collaboration (see longitudinal plan of care) Comprehensive medication review performed; medication list updated in electronic medical record  Health Maintenance: Reports she had a foot exam done today by insurance nurse. Reports the insurance nurse said she had a good appointment. Notes she should be able to afford preventative cardiac imaging in ~ December, as her last car payment will be in November.   Diabetes: Improved and at a more relaxed goal of <7.5% given age, desire to avoid additional medications; current treatment: Trulicity 3 mg weekly, metformin XR 1000 mg daily (dose limited by GI upset)  Hx yeast infections with Jardiance Current glucose readings: fastings 130-160; this morning was 166 Denies any GI upset. Denies any symptoms of hypoglycemia. Approved for [employer] through 2022. Current meal patterns: dog wakes her up earlier; lunch: and  supper;  steamed vegetables, cauliflower, carrots; eats a lot of fruit - strawberries, blackberries, bananas, watermelon; snacks: pecans; before bed snack: popcorn, sometimes rice krispies, chips and salsa; drinks: fruit juices, coke zero; Reports she goes out to eat once a week when her son is with his significant other.  Current physical activity: limited by knee arthritis. Still active around the house. Does laundry, cleans house.  Continue current regimen at this time. Continue to focus on lifestyle modifications. Follow A1c with next provider visit.   Hyperlipidemia and ASCVD risk reduction  Controlled per last lipid panel; current treatment: rosuvastatin 40 mg daily;  Antiplatelet therapy: aspirin 81 mg daily  Recommended to continue current treatment regimen.    Depression with Insomnia: Controlled per patient report; current treatment: fluoxetine 20 mg daily, trazodone 50 mg QPM. Reports mood has been well controlled lately. Reports she sleeps ~11-12 hours daily. Does not wake up groggy/sedated.  Recommended to continue current regimen at this time.    Hypothyroidism: Controlled per last TSH; current treatment regimen: levothyroxine 88 mcg daily Confirmed appropriate administration.  Recommended to continue current treatment regimen at this time  GERD: Controlled per patient report; current treatment: pantoprazole 40 mg daily Denies any concerns with acid reflux with once daily administration.  Recommended to continue current treatment regimen at this time.  Osteoarthritis: Moderately well controlled; diclofenac 50 mg BID PRN; acetaminophen 1000 mg TID Recommended to continue current regimen at this time with renal function monitoring. Continue PPI for GI protection due to chronic NSAID use.   Supplement: Vitamin D, Vitamin B12, ferrous sulfate 325 mg daily   Patient Goals/Self-Care Activities Over the next 90 days, patient will:  - take medications as prescribed check glucose daily,  document, and provide at future appointments collaborate with provider on medication access solutions  Follow Up Plan: Telephone follow up appointment with care management team member scheduled for: ~ 12 weeks      Medication Assistance:  Trulicity obtained through [employer] medication assistance program.  Enrollment ends 11/05/21  Patient's preferred pharmacy is:  [facility] [place], [address] [place] [address] Phone: [phone] Fax: [phone]  [facility]  ([place]) - [place], [address] [place] [address] Phone: [phone] Fax: [phone]   Follow Up:  Patient agrees to Care Plan and Follow-up.  Plan: Telephone follow up appointment with care management team member scheduled for:  ~ 12 weeks  Catie Darnelle Maffucci, PharmD, [place], Center Clinical Pharmacist [employer] at [employer] [phone]

## 2021-07-07 ENCOUNTER — Other Ambulatory Visit: Payer: Medicare Other

## 2021-07-08 ENCOUNTER — Other Ambulatory Visit: Payer: Self-pay

## 2021-07-08 ENCOUNTER — Other Ambulatory Visit (INDEPENDENT_AMBULATORY_CARE_PROVIDER_SITE_OTHER): Payer: Medicare Other

## 2021-07-08 DIAGNOSIS — E039 Hypothyroidism, unspecified: Secondary | ICD-10-CM | POA: Diagnosis not present

## 2021-07-08 DIAGNOSIS — D649 Anemia, unspecified: Secondary | ICD-10-CM

## 2021-07-08 DIAGNOSIS — E1165 Type 2 diabetes mellitus with hyperglycemia: Secondary | ICD-10-CM | POA: Diagnosis not present

## 2021-07-08 DIAGNOSIS — E7800 Pure hypercholesterolemia, unspecified: Secondary | ICD-10-CM

## 2021-07-08 LAB — CBC WITH DIFFERENTIAL/PLATELET
Basophils Absolute: 0 10*3/uL (ref 0.0–0.1)
Basophils Relative: 0.5 % (ref 0.0–3.0)
Eosinophils Absolute: 0.3 10*3/uL (ref 0.0–0.7)
Eosinophils Relative: 5.3 % — ABNORMAL HIGH (ref 0.0–5.0)
HCT: 39.7 % (ref 36.0–46.0)
Hemoglobin: 13 g/dL (ref 12.0–15.0)
Lymphocytes Relative: 21.2 % (ref 12.0–46.0)
Lymphs Abs: 1.4 10*3/uL (ref 0.7–4.0)
MCHC: 32.8 g/dL (ref 30.0–36.0)
MCV: 88.4 fl (ref 78.0–100.0)
Monocytes Absolute: 0.5 10*3/uL (ref 0.1–1.0)
Monocytes Relative: 7.8 % (ref 3.0–12.0)
Neutro Abs: 4.3 10*3/uL (ref 1.4–7.7)
Neutrophils Relative %: 65.2 % (ref 43.0–77.0)
Platelets: 322 10*3/uL (ref 150.0–400.0)
RBC: 4.49 Mil/uL (ref 3.87–5.11)
RDW: 14 % (ref 11.5–15.5)
WBC: 6.5 10*3/uL (ref 4.0–10.5)

## 2021-07-08 LAB — TSH: TSH: 3.46 u[IU]/mL (ref 0.35–5.50)

## 2021-07-08 LAB — LIPID PANEL
Cholesterol: 121 mg/dL (ref 0–200)
HDL: 45.6 mg/dL (ref >39.00)
LDL Cholesterol: 56 mg/dL (ref 0–99)
NonHDL: 75.46
Total CHOL/HDL Ratio: 3
Triglycerides: 95 mg/dL (ref 0.0–149.0)
VLDL: 19 mg/dL (ref 0.0–40.0)

## 2021-07-08 LAB — HEPATIC FUNCTION PANEL
ALT: 11 U/L (ref 0–35)
AST: 13 U/L (ref 0–37)
Albumin: 4.2 g/dL (ref 3.5–5.2)
Alkaline Phosphatase: 70 U/L (ref 39–117)
Bilirubin, Direct: 0.1 mg/dL (ref 0.0–0.3)
Total Bilirubin: 0.4 mg/dL (ref 0.2–1.2)
Total Protein: 6.9 g/dL (ref 6.0–8.3)

## 2021-07-08 LAB — MICROALBUMIN / CREATININE URINE RATIO
Creatinine,U: 117 mg/dL
Microalb Creat Ratio: 1.1 mg/g (ref 0.0–30.0)
Microalb, Ur: 1.2 mg/dL (ref 0.0–1.9)

## 2021-07-08 LAB — BASIC METABOLIC PANEL
BUN: 7 mg/dL (ref 6–23)
CO2: 24 mEq/L (ref 19–32)
Calcium: 9.4 mg/dL (ref 8.4–10.5)
Chloride: 104 mEq/L (ref 96–112)
Creatinine, Ser: 0.76 mg/dL (ref 0.40–1.20)
GFR: 76.48 mL/min (ref >60.00)
Glucose, Bld: 145 mg/dL — ABNORMAL HIGH (ref 70–99)
Potassium: 4.7 mEq/L (ref 3.5–5.1)
Sodium: 139 mEq/L (ref 135–145)

## 2021-07-08 LAB — HEMOGLOBIN A1C: Hgb A1c MFr Bld: 7.3 % — ABNORMAL HIGH (ref 4.6–6.5)

## 2021-07-08 LAB — FERRITIN: Ferritin: 10.4 ng/mL (ref 10.0–291.0)

## 2021-07-12 ENCOUNTER — Other Ambulatory Visit: Payer: Self-pay

## 2021-07-12 ENCOUNTER — Ambulatory Visit (INDEPENDENT_AMBULATORY_CARE_PROVIDER_SITE_OTHER): Payer: Medicare Other | Admitting: Internal Medicine

## 2021-07-12 VITALS — BP 120/68 | HR 100 | Temp 97.9°F | Resp 16 | Ht 61.0 in | Wt 139.0 lb

## 2021-07-12 DIAGNOSIS — Z23 Encounter for immunization: Secondary | ICD-10-CM

## 2021-07-12 DIAGNOSIS — E039 Hypothyroidism, unspecified: Secondary | ICD-10-CM | POA: Diagnosis not present

## 2021-07-12 DIAGNOSIS — E1165 Type 2 diabetes mellitus with hyperglycemia: Secondary | ICD-10-CM | POA: Diagnosis not present

## 2021-07-12 DIAGNOSIS — E7800 Pure hypercholesterolemia, unspecified: Secondary | ICD-10-CM

## 2021-07-12 DIAGNOSIS — R918 Other nonspecific abnormal finding of lung field: Secondary | ICD-10-CM | POA: Diagnosis not present

## 2021-07-12 DIAGNOSIS — R9389 Abnormal findings on diagnostic imaging of other specified body structures: Secondary | ICD-10-CM

## 2021-07-12 DIAGNOSIS — D649 Anemia, unspecified: Secondary | ICD-10-CM | POA: Diagnosis not present

## 2021-07-12 DIAGNOSIS — I700 Atherosclerosis of aorta: Secondary | ICD-10-CM

## 2021-07-12 DIAGNOSIS — I779 Disorder of arteries and arterioles, unspecified: Secondary | ICD-10-CM

## 2021-07-12 DIAGNOSIS — K219 Gastro-esophageal reflux disease without esophagitis: Secondary | ICD-10-CM | POA: Diagnosis not present

## 2021-07-12 DIAGNOSIS — F320 Major depressive disorder, single episode, mild: Secondary | ICD-10-CM

## 2021-07-12 DIAGNOSIS — F32A Depression, unspecified: Secondary | ICD-10-CM

## 2021-07-12 LAB — HM DIABETES FOOT EXAM

## 2021-07-12 NOTE — Progress Notes (Signed)
Patient ID: Jody Wang, female   DOB: [birth year], 75 y.o.   MRN: [id number]   Subjective:    Patient ID: Jody Wang, female    DOB: October 23, 1946, 75 y.o.   MRN: [id number]  This visit occurred during the SARS-CoV-2 public health emergency.  Safety protocols were in place, including screening questions prior to the visit, additional usage of staff PPE, and extensive cleaning of exam room while observing appropriate contact time as indicated for disinfecting solutions.   Patient here for scheduled physical.  She declined physical, so appt changed to f/u appt.     Chief Complaint  Patient presents with   Diabetes   Arthritis   .   HPI Here to follow up regarding her diabetes and cholesterol.   She reports she is doing relatively well.  Trying to watch her diet.  Arthritis limits exercise, but tries to stay active.  Takes tylenol. Handling stress.  No chest pain or sob reported.  No abdominal pain or bowel change reported.     Past Medical History:  Diagnosis Date   Depression    Diabetes mellitus without complication (HCC)    GERD (gastroesophageal reflux disease)    Hypercholesterolemia    Hypothyroidism    Osteoarthritis of knee    Wears dentures    partial lower   Past Surgical History:  Procedure Laterality Date   ABDOMINAL HYSTERECTOMY  1985   ovaries not removed   BREAST CYST EXCISION Right    BREAST CYST EXCISION Left    CATARACT EXTRACTION W/PHACO Right 02/16/2016   Procedure: CATARACT EXTRACTION PHACO AND INTRAOCULAR LENS PLACEMENT ([place]) RIGHT ;  Surgeon: Leandrew Koyanagi, MD;  Location: [facility];  Service: Ophthalmology;  Laterality: Right;  DIABETIC - oral meds   CATARACT EXTRACTION W/PHACO Left 03/15/2016   Procedure: CATARACT EXTRACTION PHACO AND INTRAOCULAR LENS PLACEMENT ([facility])left eye;  Surgeon: Leandrew Koyanagi, MD;  Location: [facility];  Service: Ophthalmology;  Laterality: Left;  DIABETIC - oral meds   INNER EAR SURGERY      Family History  Problem Relation Age of Onset   Diabetes Father    Coronary artery disease Father        s/p CABG   Hypertension Father    Hypercholesterolemia Father    Hypertension Brother    Diabetes Brother    Hypertension Sister    Glaucoma Sister    Diabetes Other        niece   Breast cancer Maternal Aunt    Breast cancer Paternal Aunt    Cervical cancer Paternal Aunt    Breast cancer Cousin    Breast cancer Other    Social History   Socioeconomic History   Marital status: Widowed    Spouse name: Not on file   Number of children: 3   Years of education: Not on file   Highest education level: Not on file  Occupational History   Not on file  Tobacco Use   Smoking status: Former    Packs/day: 1.00    Years: 23.00    Pack years: 23.00    Types: Cigarettes    Quit date: 11/20/1983    Years since quitting: 37.6   Smokeless tobacco: Never  Vaping Use   Vaping Use: Never used  Substance and Sexual Activity   Alcohol use: Yes    Alcohol/week: 0.0 standard drinks    Comment: rare   Drug use: No   Sexual activity: Never  Other Topics Concern  Not on file  Social History Narrative   Not on file   Social Determinants of Health   Financial Resource Strain: Medium Risk   Difficulty of Paying Living Expenses: Somewhat hard  Food Insecurity: No Food Insecurity   Worried About Running Out of Food in the Last Year: Never true   Ran Out of Food in the Last Year: Never true  Transportation Needs: No Transportation Needs   Lack of Transportation (Medical): No   Lack of Transportation (Non-Medical): No  Physical Activity: Not on file  Stress: No Stress Concern Present   Feeling of Stress : Not at all  Social Connections: Unknown   Frequency of Communication with Friends and Family: More than three times a week   Frequency of Social Gatherings with Friends and Family: More than three times a week   Attends Religious Services: Not on file   Active Member of Clubs  or Organizations: Not on file   Attends Archivist Meetings: Not on file   Marital Status: Not on file    Review of Systems  Constitutional:  Negative for appetite change and unexpected weight change.  HENT:  Negative for congestion and sinus pressure.   Respiratory:  Negative for cough, chest tightness and shortness of breath.   Cardiovascular:  Negative for chest pain, palpitations and leg swelling.  Gastrointestinal:  Negative for abdominal pain, diarrhea, nausea and vomiting.  Genitourinary:  Negative for difficulty urinating and dysuria.  Musculoskeletal:  Negative for joint swelling and myalgias.  Skin:  Negative for color change and rash.  Neurological:  Negative for dizziness, light-headedness and headaches.  Psychiatric/Behavioral:  Negative for agitation and dysphoric mood.       Objective:     BP 120/68   Pulse 100   Temp 97.9 F (36.6 C)   Resp 16   Ht 5' 1"  (1.549 m)   Wt 139 lb (63 kg)   SpO2 97%   BMI 26.26 kg/m  Wt Readings from Last 3 Encounters:  07/12/21 139 lb (63 kg)  03/08/21 137 lb 9.6 oz (62.4 kg)  02/11/21 131 lb (59.4 kg)    Physical Exam Vitals reviewed.  Constitutional:      General: She is not in acute distress.    Appearance: Normal appearance.  HENT:     Head: Normocephalic and atraumatic.     Right Ear: External ear normal.     Left Ear: External ear normal.  Eyes:     General: No scleral icterus.       Right eye: No discharge.        Left eye: No discharge.     Conjunctiva/sclera: Conjunctivae normal.  Neck:     Thyroid: No thyromegaly.  Cardiovascular:     Rate and Rhythm: Normal rate and regular rhythm.  Pulmonary:     Effort: No respiratory distress.     Breath sounds: Normal breath sounds. No wheezing.  Abdominal:     General: Bowel sounds are normal.     Palpations: Abdomen is soft.     Tenderness: There is no abdominal tenderness.  Musculoskeletal:        General: No swelling or tenderness.     Cervical  back: Neck supple. No tenderness.  Lymphadenopathy:     Cervical: No cervical adenopathy.  Skin:    Findings: No erythema or rash.  Neurological:     Mental Status: She is alert.  Psychiatric:        Mood and Affect: Mood normal.  Behavior: Behavior normal.     Outpatient Encounter Medications as of 07/12/2021  Medication Sig   acetaminophen (TYLENOL) 500 MG tablet Take 1,000 mg by mouth in the morning, at noon, and at bedtime.   aspirin 81 MG chewable tablet Chew 81 mg by mouth daily.   blood glucose meter kit and supplies KIT Dispense based on patient and insurance preference. Use to check blood sugars twice daily. DX E11.9   cholecalciferol (VITAMIN D) 1000 units tablet Take 1,000 Units by mouth daily.   diclofenac (VOLTAREN) 50 MG EC tablet TAKE 1 TABLET BY MOUTH  TWICE DAILY AS NEEDED   Dulaglutide (TRULICITY) 3 TI/4.5YK SOPN Inject 3 mg into the skin once a week.   FLUoxetine (PROZAC) 20 MG capsule TAKE 1 CAPSULE BY MOUTH  DAILY   fluticasone (FLONASE) 50 MCG/ACT nasal spray USE 2 SPRAYS IN BOTH  NOSTRILS DAILY   glucose blood (ONETOUCH VERIO) test strip TEST BLOOD SUGAR TWICE DAILY AS DIRECTED   glucose blood test strip Use as instructed to check blood sugars to check blood sugars twice daily. Dx E11.9   levocetirizine (XYZAL) 5 MG tablet Take 1 tablet (5 mg total) by mouth daily.   levothyroxine (SYNTHROID) 88 MCG tablet Take 1 tablet (88 mcg total) by mouth daily.   metFORMIN (GLUCOPHAGE-XR) 500 MG 24 hr tablet Take 1 tablet (500 mg total) by mouth 2 (two) times daily.   pantoprazole (PROTONIX) 40 MG tablet One tab po daily   rosuvastatin (CRESTOR) 40 MG tablet Take 1 tablet (40 mg total) by mouth daily.   traZODone (DESYREL) 50 MG tablet TAKE 1 TABLET BY MOUTH  DAILY   No facility-administered encounter medications on file as of 07/12/2021.     Lab Results  Component Value Date   WBC 6.5 07/08/2021   HGB 13.0 07/08/2021   HCT 39.7 07/08/2021   PLT 322.0 07/08/2021    GLUCOSE 145 (H) 07/08/2021   CHOL 121 07/08/2021   TRIG 95.0 07/08/2021   HDL 45.60 07/08/2021   LDLDIRECT 188.8 06/30/2013   LDLCALC 56 07/08/2021   ALT 11 07/08/2021   AST 13 07/08/2021   NA 139 07/08/2021   K 4.7 07/08/2021   CL 104 07/08/2021   CREATININE 0.76 07/08/2021   BUN 7 07/08/2021   CO2 24 07/08/2021   TSH 3.46 07/08/2021   HGBA1C 7.3 (H) 07/08/2021   MICROALBUR 1.2 07/08/2021    MM 3D SCREEN BREAST BILATERAL  Result Date: 10/16/2020 CLINICAL DATA:  Screening. EXAM: DIGITAL SCREENING BILATERAL MAMMOGRAM WITH TOMO AND CAD COMPARISON:  Previous exam(s). ACR Breast Density Category c: The breast tissue is heterogeneously dense, which may obscure small masses. FINDINGS: There are no findings suspicious for malignancy. Images were processed with CAD. IMPRESSION: No mammographic evidence of malignancy. A result letter of this screening mammogram will be mailed directly to the patient. RECOMMENDATION: Screening mammogram in one year. (Code:SM-B-01Y) BI-RADS CATEGORY  1: Negative. Electronically Signed   By: Lovey Newcomer M.D.   On: 10/16/2020 13:29       Assessment & Plan:   Problem List Items Addressed This Visit     Abnormal CT of the chest    Previously saw Dr Genevive Bi.  Overdue f/u.  Again discussed today.  She has previously declined.  Agreed today.  Schedule f/u chest CT.        Relevant Orders   CT Chest Wo Contrast   Anemia    With IDA.  On iron.  Have discussed GI evaluation.  cologuard 02/2019- negative.  Has declined any further GI w/up.       Relevant Orders   CBC with Differential/Platelet   Ferritin   Aortic atherosclerosis ([place])    Continue crestor.       Carotid artery disease ([place])    Previous left carotid 60-79% and right 40-50%.  Continue crestor.  Needs f/u with [facility] to monitor. Discussed again with her today.  She declines return.  Needs carotid ultrasound.  Continue risk factor modification.  Continue crestor.       GERD (gastroesophageal  reflux disease)    Continue protonix.  No upper symptoms reported.       Hypercholesterolemia    Continue crestor.  Low cholesterol diet and exercise.  Follow lipid panel and liver function tests.       Relevant Orders   Hepatic function panel   Lipid panel   Hypothyroidism    On thyroid replacement.  Follow tsh.       Mild depression (HCC)    Overall appears to be doing well and handling stress.  Continue prozac.  Follow.        Type 2 diabetes mellitus with hyperglycemia (HCC)    Continues on trulicity.  Recent a1c 7.3.  Hold on making changes in medication - (a1c , 7.5).  Discussed low carb diet and exercise as tolerated.  Will follow.        Relevant Orders   Hemoglobin I0P   Basic metabolic panel   Other Visit Diagnoses     Need for influenza vaccination    -  Primary   Relevant Orders   Flu Vaccine QUAD High Dose(Fluad) (Completed)   Abnormal findings on diagnostic imaging of lung       Relevant Orders   CT Chest Wo Contrast        Einar Pheasant, MD

## 2021-07-17 ENCOUNTER — Encounter: Payer: Self-pay | Admitting: Internal Medicine

## 2021-07-17 NOTE — Assessment & Plan Note (Signed)
Continues on trulicity.  Recent a1c 7.3.  Hold on making changes in medication - (a1c , 7.5).  Discussed low carb diet and exercise as tolerated.  Will follow.

## 2021-07-17 NOTE — Assessment & Plan Note (Signed)
Continue protonix.  No upper symptoms reported.  

## 2021-07-17 NOTE — Assessment & Plan Note (Signed)
Continue crestor 

## 2021-07-17 NOTE — Assessment & Plan Note (Signed)
Previous left carotid 60-79% and right 40-50%.  Continue crestor.  Needs f/u with [facility] to monitor. Discussed again with her today.  She declines return.  Needs carotid ultrasound.  Continue risk factor modification.  Continue crestor.

## 2021-07-17 NOTE — Assessment & Plan Note (Signed)
Continue crestor.  Low cholesterol diet and exercise. Follow lipid panel and liver function tests.   

## 2021-07-17 NOTE — Assessment & Plan Note (Signed)
With IDA.  On iron.  Have discussed GI evaluation.  cologuard 02/2019- negative.  Has declined any further GI w/up.  

## 2021-07-17 NOTE — Assessment & Plan Note (Signed)
On thyroid replacement.  Follow tsh.  

## 2021-07-17 NOTE — Assessment & Plan Note (Signed)
Previously saw Dr Genevive Bi.  Overdue f/u.  Again discussed today.  She has previously declined.  Agreed today.  Schedule f/u chest CT.

## 2021-07-17 NOTE — Assessment & Plan Note (Signed)
Overall appears to be doing well and handling stress.  Continue prozac.  Follow.   

## 2021-07-18 ENCOUNTER — Telehealth: Payer: Self-pay | Admitting: Internal Medicine

## 2021-07-18 NOTE — Telephone Encounter (Signed)
Lft pt vm to call ofc to sch CT chest. Thank you!

## 2021-07-19 ENCOUNTER — Telehealth: Payer: Self-pay | Admitting: Internal Medicine

## 2021-07-19 NOTE — Telephone Encounter (Signed)
Lft pt vm to call ofc to sch [place] chest. Thanks

## 2021-07-22 ENCOUNTER — Telehealth: Payer: Self-pay | Admitting: Internal Medicine

## 2021-07-22 ENCOUNTER — Other Ambulatory Visit: Payer: Self-pay | Admitting: Internal Medicine

## 2021-07-22 MED ORDER — LEVOCETIRIZINE DIHYDROCHLORIDE 5 MG PO TABS: 5.0000 mg | ORAL_TABLET | Freq: Every day | ORAL | 1 refills | Status: DC

## 2021-07-22 NOTE — Telephone Encounter (Signed)
Patient is requesting a refill on her levocetirizine (XYZAL) 5 MG tablet.

## 2021-08-01 ENCOUNTER — Telehealth: Payer: Self-pay | Admitting: Internal Medicine

## 2021-08-01 DIAGNOSIS — E1165 Type 2 diabetes mellitus with hyperglycemia: Secondary | ICD-10-CM

## 2021-08-01 MED ORDER — TRULICITY 3 MG/0.5ML ~~LOC~~ SOAJ: 3.0000 mg | SUBCUTANEOUS | 1 refills | Status: DC

## 2021-08-01 NOTE — Addendum Note (Signed)
Addended byElpidio Galea T on: 08/01/2021 05:06 PM   Modules accepted: Orders

## 2021-08-01 NOTE — Telephone Encounter (Signed)
Patient is calling in to request a refill on her trulicity.Please send her prescription to the [facility].

## 2021-08-04 NOTE — Telephone Encounter (Signed)
Patient calling back in and states [facility] has not heard from our office about her Trulicity. States this is a patient assistance program.   Their number [phone].  Please advise

## 2021-08-05 MED ORDER — TRULICITY 3 MG/0.5ML ~~LOC~~ SOAJ
3.0000 mg | SUBCUTANEOUS | 2 refills | Status: DC
Start: 2021-08-05 — End: 2022-12-29

## 2021-08-05 NOTE — Addendum Note (Signed)
Addended by: De Hollingshead on: 08/05/2021 04:31 PM   Modules accepted: Orders

## 2021-08-05 NOTE — Telephone Encounter (Signed)
Called patient. Resent prescription to correct pharmacy - [facility] [employer] in [place]. They dispense for [employer].

## 2021-08-22 ENCOUNTER — Other Ambulatory Visit: Payer: Self-pay

## 2021-08-22 ENCOUNTER — Ambulatory Visit
Admission: RE | Admit: 2021-08-22 | Discharge: 2021-08-22 | Disposition: A | Discharge status: Home / Self Care | Payer: Medicare Other | Source: Ambulatory Visit | Attending: Internal Medicine | Admitting: Internal Medicine

## 2021-08-22 DIAGNOSIS — R9389 Abnormal findings on diagnostic imaging of other specified body structures: Secondary | ICD-10-CM | POA: Insufficient documentation

## 2021-08-22 DIAGNOSIS — I700 Atherosclerosis of aorta: Secondary | ICD-10-CM | POA: Diagnosis not present

## 2021-08-22 DIAGNOSIS — R911 Solitary pulmonary nodule: Secondary | ICD-10-CM | POA: Diagnosis not present

## 2021-08-22 DIAGNOSIS — R918 Other nonspecific abnormal finding of lung field: Secondary | ICD-10-CM | POA: Diagnosis not present

## 2021-08-30 ENCOUNTER — Other Ambulatory Visit: Payer: Self-pay | Admitting: Internal Medicine

## 2021-08-30 DIAGNOSIS — Z1231 Encounter for screening mammogram for malignant neoplasm of breast: Secondary | ICD-10-CM

## 2021-09-01 ENCOUNTER — Other Ambulatory Visit: Payer: Self-pay | Admitting: Internal Medicine

## 2021-09-21 ENCOUNTER — Telehealth: Payer: Self-pay | Admitting: Internal Medicine

## 2021-09-21 NOTE — Telephone Encounter (Signed)
Lft pt vm to follow up with pt about getting the CT done. thanks

## 2021-09-26 ENCOUNTER — Ambulatory Visit (INDEPENDENT_AMBULATORY_CARE_PROVIDER_SITE_OTHER): Payer: Medicare Other | Admitting: Pharmacist

## 2021-09-26 DIAGNOSIS — I779 Disorder of arteries and arterioles, unspecified: Secondary | ICD-10-CM

## 2021-09-26 DIAGNOSIS — I700 Atherosclerosis of aorta: Secondary | ICD-10-CM

## 2021-09-26 DIAGNOSIS — E1165 Type 2 diabetes mellitus with hyperglycemia: Secondary | ICD-10-CM

## 2021-09-26 NOTE — Patient Instructions (Signed)
Jody Wang,   Keep up the great work!  Schedule your yearly diabetic eye exam.   We recommend you get the updated bivalent COVID-19 booster, at least 2 months after any prior doses. You may consider delaying a booster dose by 3 months from a prior episode of COVID-19 per the CDC.   You can find pharmacies that have this formulation in stock at [email or web address]   We also recommend that everyone get their 1) Tdap (tetanus, diptheria, and pertussis) and 2) Shingrix (shingles) vaccines. These will have a $0 copay in 2023 at your local pharmacy.   Catie Darnelle Maffucci, PharmD, [place], CPP Clinical Pharmacist [place] at [facility] [phone]   Visit Information  Following are the goals we discussed today:  Patient Goals/Self-Care Activities Over the next 90 days, patient will:  - take medications as prescribed check glucose daily, document, and provide at future appointments collaborate with provider on medication access solutions          Plan: Telephone follow up appointment with care management team member scheduled for:  4 months   Catie Darnelle Maffucci, PharmD, [place], CPP Clinical Pharmacist [place] at [facility] [phone]     Please call the care guide team at [phone] if you need to cancel or reschedule your appointment.   The patient verbalized understanding of instructions, educational materials, and care plan provided today and agreed to receive a mailed copy of patient instructions, educational materials, and care plan.

## 2021-09-26 NOTE — Chronic Care Management (AMB) (Signed)
Chronic Care Management CCM Pharmacy Note  09/26/2021 Name:  Jody Wang MRN:  [id number] DOB:  [birth year]  Summary: - Tolerating medications well. Due to reapply for patient assistance  Recommendations/Changes made from today's visit: - Continue current regimen at this time  Subjective: MAHDIYA MOSSBERG is an 75 y.o. year old female who is a primary patient of Einar Pheasant, MD.  The CCM team was consulted for assistance with disease management and care coordination needs.    Engaged with patient by telephone for follow up visit for pharmacy case management and/or care coordination services.   Objective:  Medications Reviewed Today     Reviewed by De Hollingshead, RPH-CPP (Pharmacist) on 09/26/21 at 71  Med List Status: <None>   Medication Order Taking? Sig Documenting Provider Last Dose Status Informant  acetaminophen (TYLENOL) 500 MG tablet [id number] Yes Take 1,000 mg by mouth in the morning, at noon, and at bedtime. [provider] Taking Active   aspirin 81 MG chewable tablet [id number] Yes Chew 81 mg by mouth daily. [provider] Taking Active   blood glucose meter kit and supplies KIT [id number] Yes Dispense based on patient and insurance preference. Use to check blood sugars twice daily. DX E11.9 Einar Pheasant, MD Taking Active   cholecalciferol (VITAMIN D) 1000 units tablet [id number] Yes Take 1,000 Units by mouth daily. [provider] Taking Active   diclofenac (VOLTAREN) 50 MG EC tablet [id number] Yes TAKE 1 TABLET BY MOUTH  TWICE DAILY AS NEEDED Einar Pheasant, MD Taking Active            Med Note Mayo Ao Sep 26, 2021  3:45 PM) Taking ~ once daily  Dulaglutide (TRULICITY) 3 PQ/3.3AQ SOPN [id number] Yes Inject 3 mg into the skin once a week. Einar Pheasant, MD Taking Active   FLUoxetine (PROZAC) 20 MG capsule [id number] Yes TAKE 1 CAPSULE BY MOUTH  DAILY Einar Pheasant, MD Taking Active   fluticasone  (FLONASE) 50 MCG/ACT nasal spray [id number] Yes USE 2 SPRAYS IN BOTH  NOSTRILS DAILY Einar Pheasant, MD Taking Active   glucose blood (ONETOUCH VERIO) test strip [id number]  TEST BLOOD SUGAR TWICE DAILY AS DIRECTED Einar Pheasant, MD  Active   glucose blood test strip [id number]  Use as instructed to check blood sugars to check blood sugars twice daily. Dx E11.9 Einar Pheasant, MD  Active   levocetirizine (XYZAL) 5 MG tablet [id number] Yes TAKE 1 TABLET BY MOUTH  DAILY Einar Pheasant, MD Taking Active   levothyroxine (SYNTHROID) 88 MCG tablet [id number] Yes Take 1 tablet (88 mcg total) by mouth daily. Einar Pheasant, MD Taking Active   metFORMIN (GLUCOPHAGE-XR) 500 MG 24 hr tablet [id number] Yes Take 1 tablet (500 mg total) by mouth 2 (two) times daily. Einar Pheasant, MD Taking Active            Med Note Nat Christen Feb 03, 2021  1:04 PM) 1000 mg daily   pantoprazole (PROTONIX) 40 MG tablet [id number] Yes TAKE 1 TABLET BY MOUTH  DAILY Einar Pheasant, MD Taking Active   rosuvastatin (CRESTOR) 40 MG tablet [id number] Yes Take 1 tablet (40 mg total) by mouth daily. Einar Pheasant, MD Taking Active   traZODone (DESYREL) 50 MG tablet [id number] Yes TAKE 1 TABLET BY MOUTH  DAILY Einar Pheasant, MD Taking Active             Pertinent Labs:   Lab Results  Component Value Date   HGBA1C  7.3 (H) 07/08/2021   Lab Results  Component Value Date   CHOL 121 07/08/2021   HDL 45.60 07/08/2021   LDLCALC 56 07/08/2021   LDLDIRECT 188.8 06/30/2013   TRIG 95.0 07/08/2021   CHOLHDL 3 07/08/2021   Lab Results  Component Value Date   CREATININE 0.76 07/08/2021   BUN 7 07/08/2021   NA 139 07/08/2021   K 4.7 07/08/2021   CL 104 07/08/2021   CO2 24 07/08/2021    SDOH:  (Social Determinants of Health) assessments and interventions performed:  SDOH Interventions    Flowsheet Row Most Recent Value  SDOH Interventions   Financial Strain Interventions Other (Comment)  [manufacturer  assistance]       CCM Care Plan  Review of patient past medical history, allergies, medications, health status, including review of consultants reports, laboratory and other test data, was performed as part of comprehensive evaluation and provision of chronic care management services.   Care Plan : Medication Management  Updates made by De Hollingshead, RPH-CPP since 09/26/2021 12:00 AM     Problem: Diabetes, HLD      Long-Range Goal: Disease Progression Prevention   Start Date: 12/03/2020  Recent Progress: On track  Priority: High  Note:   Current Barriers:  Unable to independently afford treatment regimen Unable to achieve control of diabetes   Pharmacist Clinical Goal(s):  Over the next 90 days, patient will verbalize ability to afford treatment regimen. Over the next 90 days, patient will achieve control of diabetes as evidenced by improvement in [place] collaboration with PharmD and provider.   Interventions: 1:1 collaboration with Einar Pheasant, MD regarding development and update of comprehensive plan of care as evidenced by provider attestation and co-signature Inter-disciplinary care team collaboration (see longitudinal plan of care) Comprehensive medication review performed; medication list updated in electronic medical record  Health Maintenance   Yearly diabetic eye exam: due - recommended to schedule Yearly diabetic foot exam: up to date Urine microalbumin: up to date Yearly influenza vaccination: up to date Td/Tdap vaccination: due - encouraged to pursue in 2023 Pneumonia vaccination: up to date COVID vaccinations: due - recommended bivalent booster Shingrix vaccinations: due - encouraged to pursue in 2023 Colonoscopy: due - deferred  Bone density scan: up to date Mammogram: up to date  Diabetes: Improved and at a more relaxed goal of <7.5% given age, desire to avoid additional medications; current treatment: Trulicity 3 mg weekly, metformin XR  1000 mg daily (dose limited by GI upset)  Hx yeast infections with Jardiance Current glucose readings: fastings 150-160s; 2 hour post prandials: about the same recently  Approved for [employer] through 2022. Current meal patterns: breakfast: soup; lunch: soup w/ crackers; supper: [place]  - pollo fundido, quesadillas, but generally fajitas. Discussed focusing primarily on proteins, vegetables, minimizing carbohydrates  Current physical activity: limited by knee arthritis. Continue current regimen at this time. Continue to focus on lifestyle modifications. Follow A1c with next provider visit.  Will collaborate w/ CPhT, patient, and providers to reapply for patient assistance for Trulicity for 4696. Discussed that if A1c increases to >7.5%, we should consider Trulicity dose increase or change to more potent incretin agent.   Hyperlipidemia and ASCVD risk reduction Controlled per last lipid panel; current treatment: rosuvastatin 40 mg daily;  Antiplatelet therapy: aspirin 81 mg daily  Reviewed recent lab work Recommended to continue current treatment regimen.    Depression with Insomnia: Controlled per patient report; current treatment: fluoxetine 20 mg daily, trazodone 50 mg QPM.  Previously recommended to continue current regimen at this time.    Hypothyroidism: Controlled per last TSH; current treatment regimen: levothyroxine 88 mcg daily Previously recommended to continue current treatment regimen at this time  GERD: Controlled per patient report; current treatment: pantoprazole 40 mg daily Denies any concerns with acid reflux with once daily administration.  Previously recommended to continue current treatment regimen at this time.  Osteoarthritis: Moderately well controlled; diclofenac 50 mg BID PRN; acetaminophen 1000 mg TID Recommended to continue current regimen at this time with renal function monitoring. Continue PPI for GI protection due to chronic NSAID use.    Supplement: Vitamin D, Vitamin B12, ferrous sulfate 325 mg daily   Patient Goals/Self-Care Activities Over the next 90 days, patient will:  - take medications as prescribed check glucose daily, document, and provide at future appointments collaborate with provider on medication access solutions       Plan: Telephone follow up appointment with care management team member scheduled for:  4 months  Catie Darnelle Maffucci, PharmD, [place], [place] Pharmacist [employer] at [employer] [phone]

## 2021-09-27 ENCOUNTER — Telehealth: Payer: Self-pay | Admitting: Pharmacy Technician

## 2021-09-27 DIAGNOSIS — Z596 Low income: Secondary | ICD-10-CM

## 2021-09-27 NOTE — Progress Notes (Addendum)
[facility])                                            [place] Team   ADDENDUM  10/24/2021  Successful outreach call placed to patient, HIPAA verified. Patient did not received initial application that was mailed to her as outlined below. Another application was mailed to patient today.  [name], [name]  [phone]  09/27/2021  Jody Wang 09/18/1946 [id number]  FOR 2023 RE ENROLLMENT                                      Medication Assistance Referral  Referral From: [facility] Embedded RPh Catie T.   Medication/Company: Danelle Berry / [employer] Patient application portion:  Mailed Provider application portion: Interoffice Mailed to Dr. Nicki Reaper Provider address/fax verified via: Office website  Symeon Puleo P. Elmer Merwin, [place]  [phone]

## 2021-10-05 DIAGNOSIS — E1165 Type 2 diabetes mellitus with hyperglycemia: Secondary | ICD-10-CM

## 2021-11-04 ENCOUNTER — Other Ambulatory Visit: Payer: Self-pay | Admitting: Internal Medicine

## 2021-11-04 DIAGNOSIS — E1165 Type 2 diabetes mellitus with hyperglycemia: Secondary | ICD-10-CM

## 2021-11-09 ENCOUNTER — Other Ambulatory Visit (INDEPENDENT_AMBULATORY_CARE_PROVIDER_SITE_OTHER): Payer: Medicare HMO

## 2021-11-09 ENCOUNTER — Other Ambulatory Visit: Payer: Self-pay

## 2021-11-09 ENCOUNTER — Ambulatory Visit: Payer: Medicare HMO | Admitting: Pharmacist

## 2021-11-09 DIAGNOSIS — E7800 Pure hypercholesterolemia, unspecified: Secondary | ICD-10-CM | POA: Diagnosis not present

## 2021-11-09 DIAGNOSIS — E1165 Type 2 diabetes mellitus with hyperglycemia: Secondary | ICD-10-CM

## 2021-11-09 DIAGNOSIS — I700 Atherosclerosis of aorta: Secondary | ICD-10-CM

## 2021-11-09 DIAGNOSIS — D649 Anemia, unspecified: Secondary | ICD-10-CM | POA: Diagnosis not present

## 2021-11-09 LAB — LIPID PANEL
Cholesterol: 160 mg/dL (ref 0–200)
HDL: 44.1 mg/dL (ref >39.00)
LDL Cholesterol: 92 mg/dL (ref 0–99)
NonHDL: 115.4
Total CHOL/HDL Ratio: 4
Triglycerides: 115 mg/dL (ref 0.0–149.0)
VLDL: 23 mg/dL (ref 0.0–40.0)

## 2021-11-09 LAB — HEPATIC FUNCTION PANEL
ALT: 10 U/L (ref 0–35)
AST: 11 U/L (ref 0–37)
Albumin: 4.2 g/dL (ref 3.5–5.2)
Alkaline Phosphatase: 89 U/L (ref 39–117)
Bilirubin, Direct: 0.1 mg/dL (ref 0.0–0.3)
Total Bilirubin: 0.3 mg/dL (ref 0.2–1.2)
Total Protein: 7.2 g/dL (ref 6.0–8.3)

## 2021-11-09 LAB — BASIC METABOLIC PANEL
BUN: 13 mg/dL (ref 6–23)
CO2: 28 mEq/L (ref 19–32)
Calcium: 9.1 mg/dL (ref 8.4–10.5)
Chloride: 100 mEq/L (ref 96–112)
Creatinine, Ser: 0.85 mg/dL (ref 0.40–1.20)
GFR: 66.7 mL/min (ref >60.00)
Glucose, Bld: 144 mg/dL — ABNORMAL HIGH (ref 70–99)
Potassium: 4.8 mEq/L (ref 3.5–5.1)
Sodium: 137 mEq/L (ref 135–145)

## 2021-11-09 LAB — CBC WITH DIFFERENTIAL/PLATELET
Basophils Absolute: 0 10*3/uL (ref 0.0–0.1)
Basophils Relative: 0.4 % (ref 0.0–3.0)
Eosinophils Absolute: 0.3 10*3/uL (ref 0.0–0.7)
Eosinophils Relative: 3.4 % (ref 0.0–5.0)
HCT: 38.7 % (ref 36.0–46.0)
Hemoglobin: 12.7 g/dL (ref 12.0–15.0)
Lymphocytes Relative: 27.3 % (ref 12.0–46.0)
Lymphs Abs: 2.1 10*3/uL (ref 0.7–4.0)
MCHC: 32.8 g/dL (ref 30.0–36.0)
MCV: 88 fl (ref 78.0–100.0)
Monocytes Absolute: 0.8 10*3/uL (ref 0.1–1.0)
Monocytes Relative: 10.6 % (ref 3.0–12.0)
Neutro Abs: 4.4 10*3/uL (ref 1.4–7.7)
Neutrophils Relative %: 58.3 % (ref 43.0–77.0)
Platelets: 363 10*3/uL (ref 150.0–400.0)
RBC: 4.4 Mil/uL (ref 3.87–5.11)
RDW: 14 % (ref 11.5–15.5)
WBC: 7.5 10*3/uL (ref 4.0–10.5)

## 2021-11-09 LAB — FERRITIN: Ferritin: 14.2 ng/mL (ref 10.0–291.0)

## 2021-11-09 LAB — HEMOGLOBIN A1C: Hgb A1c MFr Bld: 7.8 % — ABNORMAL HIGH (ref 4.6–6.5)

## 2021-11-09 NOTE — Patient Instructions (Signed)
Visit Information  Following are the goals we discussed today:  Patient Goals/Self-Care Activities Over the next 90 days, patient will:  - take medications as prescribed check glucose daily, document, and provide at future appointments collaborate with provider on medication access solutions          Plan: Telephone follow up appointment with care management team member scheduled for:  3 months as previously scheduled   Catie Darnelle Maffucci, PharmD, [place], CPP Clinical Pharmacist [place] at [facility] [phone]     Please call the care guide team at [phone] if you need to cancel or reschedule your appointment.   The patient verbalized understanding of instructions, educational materials, and care plan provided today and declined offer to receive copy of patient instructions, educational materials, and care plan.

## 2021-11-09 NOTE — Chronic Care Management (AMB) (Signed)
Chronic Care Management CCM Pharmacy Note  11/09/2021 Name:  Jody Wang MRN:  [id number] DOB:  October 11, 1946  Summary: - Received patient and provider portion of application for [employer]   Recommendations/Changes made from today's visit: - Submitted application  Subjective: Jody Wang is an 76 y.o. year old female who is a primary patient of Einar Pheasant, MD.  The CCM team was consulted for assistance with disease management and care coordination needs.    Engaged with patient by telephone for follow up visit for pharmacy case management and/or care coordination services.   Objective:  Medications Reviewed Today     Reviewed by De Hollingshead, RPH-CPP (Pharmacist) on 09/26/21 at 76  Med List Status: <None>   Medication Order Taking? Sig Documenting Provider Last Dose Status Informant  acetaminophen (TYLENOL) 500 MG tablet [id number] Yes Take 1,000 mg by mouth in the morning, at noon, and at bedtime. [provider] Taking Active   aspirin 81 MG chewable tablet [id number] Yes Chew 81 mg by mouth daily. [provider] Taking Active   blood glucose meter kit and supplies KIT [id number] Yes Dispense based on patient and insurance preference. Use to check blood sugars twice daily. DX E11.9 Einar Pheasant, MD Taking Active   cholecalciferol (VITAMIN D) 1000 units tablet [id number] Yes Take 1,000 Units by mouth daily. [provider] Taking Active   diclofenac (VOLTAREN) 50 MG EC tablet [id number] Yes TAKE 1 TABLET BY MOUTH  TWICE DAILY AS NEEDED Einar Pheasant, MD Taking Active            Med Note Mayo Ao Sep 26, 2021  3:45 PM) Taking ~ once daily  Dulaglutide (TRULICITY) 3 KK/9.3GH SOPN [id number] Yes Inject 3 mg into the skin once a week. Einar Pheasant, MD Taking Active   FLUoxetine (PROZAC) 20 MG capsule [id number] Yes TAKE 1 CAPSULE BY MOUTH  DAILY Einar Pheasant, MD Taking Active   fluticasone (FLONASE) 50  MCG/ACT nasal spray [id number] Yes USE 2 SPRAYS IN BOTH  NOSTRILS DAILY Einar Pheasant, MD Taking Active   glucose blood (ONETOUCH VERIO) test strip [id number]  TEST BLOOD SUGAR TWICE DAILY AS DIRECTED Einar Pheasant, MD  Active   glucose blood test strip [id number]  Use as instructed to check blood sugars to check blood sugars twice daily. Dx E11.9 Einar Pheasant, MD  Active   levocetirizine (XYZAL) 5 MG tablet [id number] Yes TAKE 1 TABLET BY MOUTH  DAILY Einar Pheasant, MD Taking Active   levothyroxine (SYNTHROID) 88 MCG tablet [id number] Yes Take 1 tablet (88 mcg total) by mouth daily. Einar Pheasant, MD Taking Active   metFORMIN (GLUCOPHAGE-XR) 500 MG 24 hr tablet [id number] Yes Take 1 tablet (500 mg total) by mouth 2 (two) times daily. Einar Pheasant, MD Taking Active            Med Note Nat Christen Feb 03, 2021  1:04 PM) 1000 mg daily   pantoprazole (PROTONIX) 40 MG tablet [id number] Yes TAKE 1 TABLET BY MOUTH  DAILY Einar Pheasant, MD Taking Active   rosuvastatin (CRESTOR) 40 MG tablet [id number] Yes Take 1 tablet (40 mg total) by mouth daily. Einar Pheasant, MD Taking Active   traZODone (DESYREL) 50 MG tablet [id number] Yes TAKE 1 TABLET BY MOUTH  DAILY Einar Pheasant, MD Taking Active             Pertinent Labs:   Lab Results  Component Value Date   HGBA1C 7.8 (H)  11/09/2021   Lab Results  Component Value Date   CHOL 160 11/09/2021   HDL 44.10 11/09/2021   LDLCALC 92 11/09/2021   LDLDIRECT 188.8 06/30/2013   TRIG 115.0 11/09/2021   CHOLHDL 4 11/09/2021   Lab Results  Component Value Date   CREATININE 0.85 11/09/2021   BUN 13 11/09/2021   NA 137 11/09/2021   K 4.8 11/09/2021   CL 100 11/09/2021   CO2 28 11/09/2021    SDOH:  (Social Determinants of Health) assessments and interventions performed:  SDOH Interventions    Flowsheet Row Most Recent Value  SDOH Interventions   Financial Strain Interventions Other (Comment)  [manufacturer assistance]        CCM Care Plan  Review of patient past medical history, allergies, medications, health status, including review of consultants reports, laboratory and other test data, was performed as part of comprehensive evaluation and provision of chronic care management services.   Care Plan : Medication Management  Updates made by De Hollingshead, RPH-CPP since 11/09/2021 12:00 AM     Problem: Diabetes, HLD      Long-Range Goal: Disease Progression Prevention   Start Date: 12/03/2020  Recent Progress: On track  Priority: High  Note:   Current Barriers:  Unable to independently afford treatment regimen Unable to achieve control of diabetes   Pharmacist Clinical Goal(s):  Over the next 90 days, patient will verbalize ability to afford treatment regimen. Over the next 90 days, patient will achieve control of diabetes as evidenced by improvement in [place] collaboration with PharmD and provider.   Interventions: 1:1 collaboration with Einar Pheasant, MD regarding development and update of comprehensive plan of care as evidenced by provider attestation and co-signature Inter-disciplinary care team collaboration (see longitudinal plan of care) Comprehensive medication review performed; medication list updated in electronic medical record  Health Maintenance   Yearly diabetic eye exam: due - recommended to schedule Yearly diabetic foot exam: up to date Urine microalbumin: up to date Yearly influenza vaccination: up to date Td/Tdap vaccination: due - encouraged to pursue in 2023 Pneumonia vaccination: up to date COVID vaccinations: due - recommended bivalent booster Shingrix vaccinations: due - encouraged to pursue in 2023 Colonoscopy: due - deferred  Bone density scan: up to date Mammogram: up to date  Diabetes: Improved and at a more relaxed goal of <7.5% given age, desire to avoid additional medications; current treatment: Trulicity 3 mg weekly, metformin XR 1000 mg daily  (dose limited by GI upset)  Hx yeast infections with Jardiance Received patient and provider portion of [employer] application. Submitted to [employer]  Hyperlipidemia and ASCVD risk reduction Controlled per last lipid panel; current treatment: rosuvastatin 40 mg daily;  Antiplatelet therapy: aspirin 81 mg daily  Previously recommended to continue current treatment regimen.    Depression with Insomnia: Controlled per patient report; current treatment: fluoxetine 20 mg daily, trazodone 50 mg QPM.  Previously recommended to continue current regimen at this time.    Hypothyroidism: Controlled per last TSH; current treatment regimen: levothyroxine 88 mcg daily Previously recommended to continue current treatment regimen at this time  GERD: Controlled per patient report; current treatment: pantoprazole 40 mg daily Denies any concerns with acid reflux with once daily administration.  Previously recommended to continue current treatment regimen at this time.  Osteoarthritis: Moderately well controlled; diclofenac 50 mg BID PRN; acetaminophen 1000 mg TID Previously recommended to continue current regimen at this time with renal function monitoring. Continue PPI for GI protection due to chronic NSAID  use.   Supplement: Vitamin D, Vitamin B12, ferrous sulfate 325 mg daily   Patient Goals/Self-Care Activities Over the next 90 days, patient will:  - take medications as prescribed check glucose daily, document, and provide at future appointments collaborate with provider on medication access solutions       Plan: Telephone follow up appointment with care management team member scheduled for:  3 months as previously scheduled  Catie Darnelle Maffucci, PharmD, [place], [place] Pharmacist [employer] at [employer] [phone]

## 2021-11-10 ENCOUNTER — Telehealth: Payer: Self-pay | Admitting: Pharmacy Technician

## 2021-11-10 DIAGNOSIS — Z596 Low income: Secondary | ICD-10-CM

## 2021-11-10 NOTE — Progress Notes (Signed)
[facility])                                            [place] Team    11/10/2021  Jody Wang 11-Sep-1946 [id number]  Received both patient and provider portion(s) of patient assistance application(s) for Trulicity. Faxed completed application and required documents into [employer].    [name] P. [name], [place]  [phone]

## 2021-11-11 ENCOUNTER — Other Ambulatory Visit: Payer: Self-pay

## 2021-11-11 ENCOUNTER — Ambulatory Visit (INDEPENDENT_AMBULATORY_CARE_PROVIDER_SITE_OTHER): Payer: Medicare HMO | Admitting: Internal Medicine

## 2021-11-11 ENCOUNTER — Encounter: Payer: Self-pay | Admitting: Internal Medicine

## 2021-11-11 VITALS — BP 126/72 | HR 79 | Temp 98.0°F | Resp 16 | Ht 61.0 in | Wt 140.2 lb

## 2021-11-11 DIAGNOSIS — E039 Hypothyroidism, unspecified: Secondary | ICD-10-CM

## 2021-11-11 DIAGNOSIS — K219 Gastro-esophageal reflux disease without esophagitis: Secondary | ICD-10-CM

## 2021-11-11 DIAGNOSIS — R918 Other nonspecific abnormal finding of lung field: Secondary | ICD-10-CM | POA: Diagnosis not present

## 2021-11-11 DIAGNOSIS — M2550 Pain in unspecified joint: Secondary | ICD-10-CM

## 2021-11-11 DIAGNOSIS — E1165 Type 2 diabetes mellitus with hyperglycemia: Secondary | ICD-10-CM

## 2021-11-11 DIAGNOSIS — E7800 Pure hypercholesterolemia, unspecified: Secondary | ICD-10-CM

## 2021-11-11 DIAGNOSIS — I779 Disorder of arteries and arterioles, unspecified: Secondary | ICD-10-CM

## 2021-11-11 DIAGNOSIS — I700 Atherosclerosis of aorta: Secondary | ICD-10-CM

## 2021-11-11 DIAGNOSIS — I25118 Atherosclerotic heart disease of native coronary artery with other forms of angina pectoris: Secondary | ICD-10-CM

## 2021-11-11 DIAGNOSIS — F32A Depression, unspecified: Secondary | ICD-10-CM

## 2021-11-11 DIAGNOSIS — D649 Anemia, unspecified: Secondary | ICD-10-CM

## 2021-11-11 DIAGNOSIS — Z0000 Encounter for general adult medical examination without abnormal findings: Secondary | ICD-10-CM | POA: Diagnosis not present

## 2021-11-11 DIAGNOSIS — R69 Illness, unspecified: Secondary | ICD-10-CM | POA: Diagnosis not present
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
## 2021-11-12 MED ORDER — MUPIROCIN 2 % EX OINT: 1.0000 "application " | TOPICAL_OINTMENT | Freq: Two times a day (BID) | CUTANEOUS | 0 refills | Status: DC

## 2021-11-13 ENCOUNTER — Encounter: Payer: Self-pay | Admitting: Internal Medicine

## 2021-11-13 DIAGNOSIS — M2550 Pain in unspecified joint: Secondary | ICD-10-CM | POA: Insufficient documentation

## 2021-11-13 NOTE — Assessment & Plan Note (Signed)
Continue protonix.  No upper symptoms reported.  

## 2021-11-13 NOTE — Assessment & Plan Note (Signed)
With IDA.  On iron.  Have discussed GI evaluation.  cologuard 02/2019- negative.  Has declined any further GI w/up.  

## 2021-11-13 NOTE — Assessment & Plan Note (Signed)
CT chest 08/2021 - Stable small pulmonary nodules, the largest 6 mm in the left upper lobe. These are compatible with benign nodules. No new or enlarging pulmonary nodules. 

## 2021-11-13 NOTE — Assessment & Plan Note (Signed)
Overall appears to be doing well and handling stress.  Continue prozac.  Follow.   

## 2021-11-13 NOTE — Assessment & Plan Note (Signed)
Continue crestor.  Low cholesterol diet and exercise. Follow lipid panel and liver function tests.   

## 2021-11-13 NOTE — Assessment & Plan Note (Signed)
On thyroid replacement.  Follow tsh.  

## 2021-11-13 NOTE — Assessment & Plan Note (Signed)
Continues on trulicity and metformin.   Recent a1c 7.8.  Discussed adjusting medication.  She desires to work in diet and exercise before changing medication.  Discussed low carb diet and exercise as tolerated.  Check and record sugars.  Send in readings over the next few weeks.  Follow met b and a1c.

## 2021-11-13 NOTE — Assessment & Plan Note (Signed)
Physical today 11/11/21.  cologuard 02/24/19.  Mammogram - scheduled for 12/15/21.

## 2021-11-13 NOTE — Assessment & Plan Note (Signed)
Previous left carotid 60-79% and right 40-50%.  Continue crestor.  Needs f/u with [facility] to monitor. Discussed again with her today.  She declines return.  Needs carotid ultrasound.  Continue risk factor modification.  Continue crestor.

## 2021-11-13 NOTE — Assessment & Plan Note (Signed)
Continue crestor 

## 2021-11-13 NOTE — Assessment & Plan Note (Signed)
Bilateral knees, right shoulder and low back pain.  Discussed.  Has tried antiinflammatories.  Exercise.  Request referral to rheumatology.

## 2021-11-13 NOTE — Assessment & Plan Note (Signed)
Previously seen on chest CT.  Saw Dr Gollan.  No chest pain.  Continue risk factor modification.  Continue crestor.  

## 2021-11-15 ENCOUNTER — Telehealth: Payer: Self-pay | Admitting: Internal Medicine
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
## 2021-11-15 NOTE — Telephone Encounter (Signed)
[facility]ban was [facility] to [facility]. Patient is aware. She will go pick up

## 2021-11-15 NOTE — Telephone Encounter (Signed)
-----   Message from De Hollingshead, [place] sent at 11/14/2021  8:24 AM EST ----- Regarding: RE: question She has been getting this medication for free from the manufacturer, and we have submitted the application to [employer] for 2023.   I'll reach out to make sure she understands she doesn't need to fill the medication at the pharmacy.   Catie  ----- Message ----- From: Einar Pheasant, MD Sent: 11/13/2021  10:16 PM EST To: De Hollingshead, RPH-CPP Subject: question                                       Ms Linenberger reported that her last rx for trulicity was "defective".  She was questioning cost of medication and was not sure what to do about this rx.  Can you help with this?  Let me know if I need to do anything.    Thanks  [employer]

## 2021-11-17 ENCOUNTER — Telehealth: Payer: Self-pay | Admitting: Internal Medicine

## 2021-11-17 NOTE — Telephone Encounter (Signed)
Pt called in requesting refill on medication (fluticasone (FLONASE) 50 MCG/ACT nasal spray), (traZODone (DESYREL) 50 MG tablet), and (glucose blood test strip. Pt requesting callback

## 2021-11-18 ENCOUNTER — Other Ambulatory Visit: Payer: Self-pay | Admitting: Internal Medicine

## 2021-11-18 ENCOUNTER — Other Ambulatory Visit: Payer: Self-pay

## 2021-11-18 NOTE — Telephone Encounter (Signed)
LMTCB need to know which pharmacy

## 2021-11-21 NOTE — Telephone Encounter (Signed)
LMTCB

## 2021-11-24 NOTE — Telephone Encounter (Signed)
Patient  called in stated she is returning a call from 11/21/21. Please call patient @ [phone]

## 2021-11-24 NOTE — Telephone Encounter (Signed)
LMTCB

## 2021-11-25 ENCOUNTER — Other Ambulatory Visit: Payer: Self-pay

## 2021-11-25 MED ORDER — TRAZODONE HCL 50 MG PO TABS: 50.0000 mg | ORAL_TABLET | Freq: Every day | ORAL | 3 refills | Status: DC

## 2021-11-25 MED ORDER — FLUTICASONE PROPIONATE 50 MCG/ACT NA SUSP: NASAL | 3 refills | Status: DC

## 2021-11-25 MED ORDER — GLUCOSE BLOOD VI STRP: ORAL_STRIP | 12 refills | Status: DC

## 2021-11-25 NOTE — Telephone Encounter (Signed)
Pt would like medications sent to [facility] [place]

## 2021-11-25 NOTE — Telephone Encounter (Signed)
Medications sent in.

## 2021-11-25 NOTE — Telephone Encounter (Signed)
Patient is requesting that all her medicine need to  be refilled to [facility]  fax [phone]  Flonase ... Call dropped , tried to call patient back no answer

## 2021-11-28 ENCOUNTER — Telehealth: Payer: Self-pay | Admitting: Internal Medicine

## 2021-11-28 NOTE — Telephone Encounter (Signed)
Patient called and said she has changed to [employer] and they are requesting a list of her refills. Please fax to [phone]. Phone number is [phone]. Patient thought that when she switch insurances that if automatically switch over. Patient states at this time she does not need a refill on her testing stripes.

## 2021-11-29 ENCOUNTER — Other Ambulatory Visit: Payer: Self-pay

## 2021-11-29 DIAGNOSIS — E1165 Type 2 diabetes mellitus with hyperglycemia: Secondary | ICD-10-CM

## 2021-11-29 DIAGNOSIS — Z760 Encounter for issue of repeat prescription: Secondary | ICD-10-CM

## 2021-11-29 MED ORDER — PANTOPRAZOLE SODIUM 40 MG PO TBEC: DELAYED_RELEASE_TABLET | ORAL | 3 refills | Status: DC

## 2021-11-29 MED ORDER — FLUOXETINE HCL 20 MG PO CAPS: 20.0000 mg | ORAL_CAPSULE | Freq: Every day | ORAL | 1 refills | Status: DC

## 2021-11-29 MED ORDER — GLUCOSE BLOOD VI STRP: ORAL_STRIP | 12 refills | Status: DC

## 2021-11-29 MED ORDER — DICLOFENAC SODIUM 50 MG PO TBEC: 50.0000 mg | DELAYED_RELEASE_TABLET | Freq: Two times a day (BID) | ORAL | 3 refills | Status: DC | PRN

## 2021-11-29 MED ORDER — LEVOTHYROXINE SODIUM 88 MCG PO TABS: 88.0000 ug | ORAL_TABLET | Freq: Every day | ORAL | 3 refills | Status: DC

## 2021-11-29 MED ORDER — METFORMIN HCL ER 500 MG PO TB24: 500.0000 mg | ORAL_TABLET | Freq: Two times a day (BID) | ORAL | 3 refills | Status: DC

## 2021-11-29 MED ORDER — LEVOCETIRIZINE DIHYDROCHLORIDE 5 MG PO TABS: 5.0000 mg | ORAL_TABLET | Freq: Every day | ORAL | 1 refills | Status: DC

## 2021-11-29 MED ORDER — ROSUVASTATIN CALCIUM 40 MG PO TABS: 40.0000 mg | ORAL_TABLET | Freq: Every day | ORAL | 3 refills | Status: DC

## 2021-11-29 MED ORDER — FLUTICASONE PROPIONATE 50 MCG/ACT NA SUSP: NASAL | 3 refills | Status: DC

## 2021-11-29 MED ORDER — TRAZODONE HCL 50 MG PO TABS: 50.0000 mg | ORAL_TABLET | Freq: Every day | ORAL | 3 refills | Status: DC

## 2021-11-29 NOTE — Telephone Encounter (Signed)
Patient needing all meds sent to [facility] delivery because her insurance changed. All refills sent in

## 2021-11-29 NOTE — Telephone Encounter (Signed)
Refills sent in

## 2021-12-07 ENCOUNTER — Telehealth: Payer: Self-pay | Admitting: Internal Medicine

## 2021-12-07 DIAGNOSIS — M2550 Pain in unspecified joint: Secondary | ICD-10-CM

## 2021-12-07 NOTE — Telephone Encounter (Signed)
Rejection Reason - Patient did not respond - called 3 times, no response" Florencia Reasons said on Dec 07, 2021 9:24 AM  "11/25/2021- home number is disconnected- left message to return call on cell number " Florencia Reasons said on Nov 25, 2021 9:34 AM  "11/16/2021- left message for pt to return call " Florencia Reasons said on Nov 16, 2021 11:31 AM  Msg from [facility] rheumatology

## 2021-12-15 ENCOUNTER — Other Ambulatory Visit: Payer: Self-pay

## 2021-12-15 MED ORDER — TRAZODONE HCL 50 MG PO TABS: 50.0000 mg | ORAL_TABLET | Freq: Every day | ORAL | 1 refills | Status: DC

## 2021-12-15 NOTE — Telephone Encounter (Signed)
Patient called and still has not received her medications from [place]. She has called [employer] and is not getting any one to help her. Can the office help her with this?

## 2021-12-15 NOTE — Telephone Encounter (Signed)
Patient is going to run out of her traZODone (DESYREL) 50 MG tablet can office send a refill to [facility] in [place].

## 2021-12-15 NOTE — Telephone Encounter (Signed)
I called patient & she stated that she cannot get her prescriptions from [facility]. She has called 20 plus times with no answers. I did send her tra[facility]od[facility] to [facility] [place] for her. She is switching back to [facility] as of March 1st & will call to change her pharmacy & to resend her meds then.

## 2021-12-16 NOTE — Telephone Encounter (Signed)
Pt called in requesting another referral for rheumatology for Dr. Posey Pronto. Pt requesting callback.

## 2021-12-16 NOTE — Telephone Encounter (Signed)
Referral resubmitted. Patient is aware.

## 2021-12-16 NOTE — Addendum Note (Signed)
Addended by: Lars Masson on: 12/16/2021 12:30 PM   Modules accepted: Orders

## 2021-12-20 ENCOUNTER — Telehealth: Payer: Self-pay | Admitting: Pharmacist

## 2021-12-20 ENCOUNTER — Telehealth: Payer: Self-pay | Admitting: Internal Medicine

## 2021-12-20 NOTE — Telephone Encounter (Signed)
Pt called in stating that she never received her medication (Dulaglutide (TRULICITY) 3 VB/1.6OM SOPN). Pt requesting callback

## 2021-12-20 NOTE — Telephone Encounter (Signed)
Mona from [employer] called stating that pt wanted to leave a fax number for provider to fax a prescription [phone]

## 2021-12-20 NOTE — Telephone Encounter (Signed)
Received message that patient was calling to ask about status of Jody Wang application for Trulicity. Reviewed CPhT documentation. As of yesterday, application was still processing. She will call back next week. Patient reports she has a month remaining of medication

## 2021-12-21 NOTE — Telephone Encounter (Signed)
Called number but unable to reach. Unsure what this is about.

## 2021-12-22 DIAGNOSIS — M170 Bilateral primary osteoarthritis of knee: Secondary | ICD-10-CM | POA: Diagnosis not present

## 2021-12-22 DIAGNOSIS — M159 Polyosteoarthritis, unspecified: Secondary | ICD-10-CM | POA: Diagnosis not present

## 2021-12-22 NOTE — Telephone Encounter (Signed)
Called number. Unable to reach.

## 2021-12-26 ENCOUNTER — Telehealth: Payer: Self-pay | Admitting: Pharmacy Technician

## 2021-12-26 DIAGNOSIS — Z596 Low income: Secondary | ICD-10-CM

## 2021-12-26 NOTE — Progress Notes (Signed)
[facility])                                            [place] Team    12/26/2021  Jody Wang [birth year] [phone]  Care coordination call placed to [place] in regard to Trulicity application.   Spoke to [place] who informs patient is APPROVED 12/23/2021-11/05/2022. She informs medication will ship based on last fill date in 2022 and going forward in 2023 with delivery to the patient's home. Patient can always call [facility] at [phone] to check on shipment status or [facility], which is the pharmacy Ralph Leyden uses to process and deliver medications, by calling [phone].  Rabon Scholle P. Dynastee [place], [place]  [phone]

## 2021-12-29 ENCOUNTER — Other Ambulatory Visit: Payer: Self-pay

## 2021-12-29 DIAGNOSIS — E1165 Type 2 diabetes mellitus with hyperglycemia: Secondary | ICD-10-CM

## 2021-12-29 MED ORDER — PANTOPRAZOLE SODIUM 40 MG PO TBEC: DELAYED_RELEASE_TABLET | ORAL | 3 refills | Status: DC

## 2021-12-29 MED ORDER — LEVOTHYROXINE SODIUM 88 MCG PO TABS: 88.0000 ug | ORAL_TABLET | Freq: Every day | ORAL | 3 refills | Status: DC

## 2021-12-29 MED ORDER — FLUOXETINE HCL 20 MG PO CAPS: 20.0000 mg | ORAL_CAPSULE | Freq: Every day | ORAL | 1 refills | Status: DC

## 2021-12-29 MED ORDER — ROSUVASTATIN CALCIUM 40 MG PO TABS: 40.0000 mg | ORAL_TABLET | Freq: Every day | ORAL | 3 refills | Status: DC

## 2021-12-29 MED ORDER — FLUTICASONE PROPIONATE 50 MCG/ACT NA SUSP: NASAL | 3 refills | Status: DC

## 2021-12-29 MED ORDER — METFORMIN HCL ER 500 MG PO TB24: 500.0000 mg | ORAL_TABLET | Freq: Two times a day (BID) | ORAL | 3 refills | Status: DC

## 2021-12-29 MED ORDER — LEVOCETIRIZINE DIHYDROCHLORIDE 5 MG PO TABS: 5.0000 mg | ORAL_TABLET | Freq: Every day | ORAL | 1 refills | Status: DC

## 2021-12-29 MED ORDER — TRAZODONE HCL 50 MG PO TABS: 50.0000 mg | ORAL_TABLET | Freq: Every day | ORAL | 2 refills | Status: DC

## 2021-12-29 NOTE — Telephone Encounter (Signed)
Spoke with Jody Wang and was advised that she was not sure what this was about. No notes in chart on their end advising anything further was needed. Closing note.

## 2021-12-29 NOTE — Telephone Encounter (Signed)
Pt need a refill on Fluoxetine, pantoprazole, trazodone, metformin, Flonase, levothyroxine, rosuvastatin, levocetirizine ASAP sent to [facility] home delivery

## 2021-12-29 NOTE — Telephone Encounter (Signed)
Sent prescriptions to [facility] home delivery

## 2022-01-04 ENCOUNTER — Telehealth: Payer: Self-pay | Admitting: Internal Medicine

## 2022-01-04 ENCOUNTER — Ambulatory Visit (INDEPENDENT_AMBULATORY_CARE_PROVIDER_SITE_OTHER): Payer: Medicare Other

## 2022-01-04 ENCOUNTER — Other Ambulatory Visit: Payer: Self-pay

## 2022-01-04 ENCOUNTER — Ambulatory Visit (INDEPENDENT_AMBULATORY_CARE_PROVIDER_SITE_OTHER): Payer: Medicare Other | Admitting: Adult Health

## 2022-01-04 ENCOUNTER — Encounter: Payer: Self-pay | Admitting: Adult Health

## 2022-01-04 VITALS — BP 126/70 | HR 84 | Temp 98.3°F | Ht 61.0 in | Wt 140.0 lb

## 2022-01-04 DIAGNOSIS — R051 Acute cough: Secondary | ICD-10-CM

## 2022-01-04 DIAGNOSIS — J0140 Acute pansinusitis, unspecified: Secondary | ICD-10-CM | POA: Diagnosis not present

## 2022-01-04 DIAGNOSIS — R059 Cough, unspecified: Secondary | ICD-10-CM | POA: Diagnosis not present

## 2022-01-04 DIAGNOSIS — H6501 Acute serous otitis media, right ear: Secondary | ICD-10-CM | POA: Diagnosis not present

## 2022-01-04 MED ORDER — AMOXICILLIN-POT CLAVULANATE 875-125 MG PO TABS: 1.0000 | ORAL_TABLET | Freq: Two times a day (BID) | ORAL | 0 refills | Status: DC

## 2022-01-04 MED ORDER — BENZONATATE 100 MG PO CAPS: 100.0000 mg | ORAL_CAPSULE | Freq: Two times a day (BID) | ORAL | 0 refills | Status: DC | PRN

## 2022-01-04 NOTE — Telephone Encounter (Signed)
Lft pt vm to call ofc to follow up on referral to rheumatology. thanks ?

## 2022-01-04 NOTE — Progress Notes (Signed)
Acute Office Visit  Subjective:    Patient ID: Jody Wang, female    DOB: November 27, 1945, 76 y.o.   MRN: [id number]  Chief Complaint  Patient presents with   Follow-up    COUGH - pt c/o cough x 1 wk. Sometimes dry, sometimes coughs up greenish phlegm. Feeling tired, rundown.    HPI Patient is in today for cough that started one week ago. She feels she has a deep cough, nasal congestion, and also chest congestion. Sinus pressure sinus pain.  Sneezing occasionally. Shortness of breath only with cough.  Denies and chest pain.   Denies any edema. Son has been sick as well with similar symptoms.   Stable pulmonary nodule 6 mm on CT 10/22 noted.   Patient  denies any fever, body aches,chills, rash, chest pain, shortness of breath, nausea, vomiting, or diarrhea.  Denies dizziness, lightheadedness, pre syncopal or syncopal episodes.   Past Medical History:  Diagnosis Date   Depression    Diabetes mellitus without complication (HCC)    GERD (gastroesophageal reflux disease)    Hypercholesterolemia    Hypothyroidism    Osteoarthritis of knee    Wears dentures    partial lower    Past Surgical History:  Procedure Laterality Date   ABDOMINAL HYSTERECTOMY  1985   ovaries not removed   BREAST CYST EXCISION Right    BREAST CYST EXCISION Left    CATARACT EXTRACTION W/PHACO Right 02/16/2016   Procedure: CATARACT EXTRACTION PHACO AND INTRAOCULAR LENS PLACEMENT ([place]) RIGHT ;  Surgeon: Leandrew Koyanagi, MD;  Location: [facility];  Service: Ophthalmology;  Laterality: Right;  DIABETIC - oral meds   CATARACT EXTRACTION W/PHACO Left 03/15/2016   Procedure: CATARACT EXTRACTION PHACO AND INTRAOCULAR LENS PLACEMENT ([facility])left eye;  Surgeon: Leandrew Koyanagi, MD;  Location: [facility];  Service: Ophthalmology;  Laterality: Left;  DIABETIC - oral meds   INNER EAR SURGERY      Family History  Problem Relation Age of Onset   Diabetes Father    Coronary artery  disease Father        s/p CABG   Hypertension Father    Hypercholesterolemia Father    Hypertension Brother    Diabetes Brother    Hypertension Sister    Glaucoma Sister    Diabetes Other        niece   Breast cancer Maternal Aunt    Breast cancer Paternal Aunt    Cervical cancer Paternal Aunt    Breast cancer Cousin    Breast cancer Other     Social History   Socioeconomic History   Marital status: Widowed    Spouse name: Not on file   Number of children: 3   Years of education: Not on file   Highest education level: Not on file  Occupational History   Not on file  Tobacco Use   Smoking status: Former    Packs/day: 1.00    Years: 23.00    Pack years: 23.00    Types: Cigarettes    Quit date: 11/20/1983    Years since quitting: 38.1   Smokeless tobacco: Never  Vaping Use   Vaping Use: Never used  Substance and Sexual Activity   Alcohol use: Yes    Alcohol/week: 0.0 standard drinks    Comment: rare   Drug use: No   Sexual activity: Never  Other Topics Concern   Not on file  Social History Narrative   Not on file   Social Determinants of Health  Financial Resource Strain: Medium Risk   Difficulty of Paying Living Expenses: Somewhat hard  Food Insecurity: No Food Insecurity   Worried About Charity fundraiser in the Last Year: Never true   Ran Out of Food in the Last Year: Never true  Transportation Needs: No Transportation Needs   Lack of Transportation (Medical): No   Lack of Transportation (Non-Medical): No  Physical Activity: Not on file  Stress: No Stress Concern Present   Feeling of Stress : Not at all  Social Connections: Unknown   Frequency of Communication with Friends and Family: More than three times a week   Frequency of Social Gatherings with Friends and Family: More than three times a week   Attends Religious Services: Not on Electrical engineer or Organizations: Not on file   Attends Archivist Meetings: Not on file    Marital Status: Not on file  Intimate Partner Violence: Not At Risk   Fear of Current or Ex-Partner: No   Emotionally Abused: No   Physically Abused: No   Sexually Abused: No    Outpatient Medications Prior to Visit  Medication Sig Dispense Refill   acetaminophen (TYLENOL) 500 MG tablet Take 1,000 mg by mouth in the morning, at noon, and at bedtime.     aspirin 81 MG chewable tablet Chew 81 mg by mouth daily.     blood glucose meter kit and supplies KIT Dispense based on patient and insurance preference. Use to check blood sugars twice daily. DX E11.9 1 each 0   cholecalciferol (VITAMIN D) 1000 units tablet Take 1,000 Units by mouth daily.     diclofenac (VOLTAREN) 50 MG EC tablet Take 1 tablet (50 mg total) by mouth 2 (two) times daily as needed. 180 tablet 3   Dulaglutide (TRULICITY) 3 NB/3.9YD SOPN Inject 3 mg into the skin once a week. 8 mL 2   FLUoxetine (PROZAC) 20 MG capsule Take 1 capsule (20 mg total) by mouth daily. 90 capsule 1   fluticasone (FLONASE) 50 MCG/ACT nasal spray USE 2 SPRAYS IN BOTH  NOSTRILS DAILY 48 g 3   glucose blood (ONETOUCH VERIO) test strip TEST BLOOD SUGAR TWICE DAILY AS DIRECTED 200 strip 3   glucose blood test strip Use as instructed to check blood sugars to check blood sugars twice daily. Dx E11.9 100 each 12   levocetirizine (XYZAL) 5 MG tablet Take 1 tablet (5 mg total) by mouth daily. 90 tablet 1   levothyroxine (SYNTHROID) 88 MCG tablet Take 1 tablet (88 mcg total) by mouth daily. 90 tablet 3   metFORMIN (GLUCOPHAGE-XR) 500 MG 24 hr tablet Take 1 tablet (500 mg total) by mouth 2 (two) times daily. 180 tablet 3   mupirocin ointment (BACTROBAN) 2 % Apply 1 application topically 2 (two) times daily. 22 g 0   pantoprazole (PROTONIX) 40 MG tablet TAKE 1 TABLET BY MOUTH  DAILY 90 tablet 3   rosuvastatin (CRESTOR) 40 MG tablet Take 1 tablet (40 mg total) by mouth daily. 90 tablet 3   traZODone (DESYREL) 50 MG tablet Take 1 tablet (50 mg total) by mouth daily.  90 tablet 2   vitamin B-12 (CYANOCOBALAMIN) 1000 MCG tablet Take 1,000 mcg by mouth daily.     No facility-administered medications prior to visit.    Allergies  Allergen Reactions   Codeine Shortness Of Breath    stops breathing   Meperidine And Related Shortness Of Breath    Stops breathing  Oxycontin [Oxycodone Hcl] Shortness Of Breath    Stops breathing   Jardiance [Empagliflozin] Other (See Comments)    Yeast infections   Tape Rash    Paper tape ok    Review of Systems  Constitutional:  Positive for fatigue. Negative for activity change, appetite change, chills, diaphoresis, fever and unexpected weight change.  HENT:  Positive for congestion, postnasal drip, sneezing and sore throat. Negative for tinnitus, trouble swallowing and voice change.   Respiratory:  Positive for cough. Negative for apnea, choking, chest tightness, shortness of breath, wheezing and stridor.   Cardiovascular:  Negative for chest pain, palpitations and leg swelling.  Gastrointestinal: Negative.   Genitourinary: Negative.   Musculoskeletal: Negative.   Neurological: Negative.   Psychiatric/Behavioral: Negative.        Objective:    Physical Exam Vitals reviewed.  Constitutional:      General: She is not in acute distress.    Appearance: She is obese. She is not ill-appearing, toxic-appearing or diaphoretic.  HENT:     Head: Normocephalic and atraumatic.     Jaw: There is normal jaw occlusion.     Salivary Glands: Right salivary gland is not diffusely enlarged or tender. Left salivary gland is not diffusely enlarged or tender.     Right Ear: External ear normal. No swelling or tenderness. A middle ear effusion is present. Tympanic membrane is erythematous and bulging. Tympanic membrane is not retracted.     Left Ear: External ear normal. No swelling or tenderness. A middle ear effusion is present. Tympanic membrane is not erythematous.     Nose:     Right Turbinates: Swollen.     Left  Turbinates: Swollen.     Right Sinus: Maxillary sinus tenderness and frontal sinus tenderness present.     Left Sinus: Maxillary sinus tenderness and frontal sinus tenderness present.     Mouth/Throat:     Lips: Pink.     Mouth: Mucous membranes are moist.     Pharynx: Oropharynx is clear. Uvula midline. Posterior oropharyngeal erythema present. No pharyngeal swelling, oropharyngeal exudate or uvula swelling.     Tonsils: No tonsillar exudate or tonsillar abscesses.  Cardiovascular:     Rate and Rhythm: Normal rate and regular rhythm.     Pulses: Normal pulses.     Heart sounds: Normal heart sounds.  Pulmonary:     Effort: Pulmonary effort is normal. No respiratory distress.     Breath sounds: Normal breath sounds. No stridor. No wheezing, rhonchi or rales.     Comments: No adventitious lung sounds.  Chest:     Chest wall: No tenderness.  Abdominal:     General: There is no distension.     Palpations: Abdomen is soft.     Tenderness: There is no abdominal tenderness.  Musculoskeletal:        General: Normal range of motion.     Cervical back: Normal range of motion and neck supple. No rigidity or tenderness.     Right lower leg: No edema.     Left lower leg: No edema.  Lymphadenopathy:     Cervical: No cervical adenopathy.  Skin:    General: Skin is warm.     Findings: No erythema or rash.  Neurological:     Mental Status: She is alert and oriented to person, place, and time.  Psychiatric:        Mood and Affect: Mood normal.        Behavior: Behavior normal. Behavior is cooperative.  Thought Content: Thought content normal.        Judgment: Judgment normal.    BP 126/70 (BP Location: Right Arm, Patient Position: Sitting, Cuff Size: Small)    Pulse 84    Temp 98.3 F (36.8 C) (Oral)    Ht _0  (1.549 m)    Wt 140 lb (63.5 kg)    SpO2 98%    BMI 26.45 kg/m  Wt Readings from Last 3 Encounters:  01/04/22 140 lb (63.5 kg)  11/11/21 140 lb 3.2 oz (63.6 kg)  07/12/21  139 lb (63 kg)    Health Maintenance Due  Topic Date Due   COVID-19 Vaccine (4 - Booster for Pfizer series) 10/01/2020   OPHTHALMOLOGY EXAM  07/05/2021    There are no preventive care reminders to display for this patient.   Lab Results  Component Value Date   TSH 3.46 07/08/2021   Lab Results  Component Value Date   WBC 9.0 01/04/2022   HGB 12.3 01/04/2022   HCT 37.1 01/04/2022   MCV 88.2 01/04/2022   PLT 330.0 01/04/2022   Lab Results  Component Value Date   NA 137 11/09/2021   K 4.8 11/09/2021   CO2 28 11/09/2021   GLUCOSE 144 (H) 11/09/2021   BUN 13 11/09/2021   CREATININE 0.85 11/09/2021   BILITOT 0.3 11/09/2021   ALKPHOS 89 11/09/2021   AST 11 11/09/2021   ALT 10 11/09/2021   PROT 7.2 11/09/2021   ALBUMIN 4.2 11/09/2021   CALCIUM 9.1 11/09/2021   GFR 66.70 11/09/2021   Lab Results  Component Value Date   CHOL 160 11/09/2021   Lab Results  Component Value Date   HDL 44.10 11/09/2021   Lab Results  Component Value Date   LDLCALC 92 11/09/2021   Lab Results  Component Value Date   TRIG 115.0 11/09/2021   Lab Results  Component Value Date   CHOLHDL 4 11/09/2021   Lab Results  Component Value Date   HGBA1C 7.8 (H) 11/09/2021       Assessment & Plan:   1. Acute non-recurrent pansinusitis  - CBC with Differential/Platelet - DG Chest 2 View - COVID-19, Flu A+B and RSV - Culture, Group A Strep  2. Acute cough - DG Chest 2 View  3. Right otitis media  Augmentin as prescribed.    Meds ordered this encounter  Medications   amoxicillin-clavulanate (AUGMENTIN) 875-125 MG tablet    Sig: Take 1 tablet by mouth 2 (two) times daily.    Dispense:  20 tablet    Refill:  0   benzonatate (TESSALON) 100 MG capsule    Sig: Take 1 capsule (100 mg total) by mouth 2 (two) times daily as needed for cough.    Dispense:  30 capsule    Refill:  0   She requests benzonatate.   Orders Placed This Encounter  Procedures   COVID-19, Flu A+B and RSV     Order Specific Question:   Previously tested for COVID-19    Answer:   No    Order Specific Question:   Resident in a congregate (group) care setting    Answer:   No    Order Specific Question:   Is the patient student?    Answer:   No    Order Specific Question:   Employed in healthcare setting    Answer:   No    Order Specific Question:   Pregnant    Answer:   No    Order  Specific Question:   Has patient completed COVID vaccination(s) (2 doses of Pfizer/Moderna 1 dose of [employer])    Answer:   Yes    Order Specific Question:   Has patient completed COVID Booster / 3rd dose    Answer:   Yes    Order Specific Question:   Release to patient    Answer:   Immediate   Culture, Group A Strep    Order Specific Question:   Source    Answer:   THROAT SWAB    Order Specific Question:   Release to patient    Answer:   Immediate   DG Chest 2 View    Order Specific Question:   Reason for Exam (SYMPTOM  OR DIAGNOSIS REQUIRED)    Answer:   cough over 1 week . rule put pneumonia    Order Specific Question:   Preferred imaging location?    Answer:   [place]   CBC with Differential/Platelet    Return in about 4 days (around 01/08/2022), or if symptoms worsen or fail to improve, for at any time for any worsening symptoms, Go to Emergency room/ urgent care if worse.   Advised patient call the office or your primary care doctor for an appointment if no improvement within 72 hours or if any symptoms change or worsen at any time  Advised ER or urgent Care if after hours or on weekend. Call 911 for emergency symptoms at any time.Patinet verbalized understanding of all instructions given/reviewed and treatment plan and has no further questions or concerns at this time.    Marcille Buffy, FNP

## 2022-01-04 NOTE — Patient Instructions (Signed)
Cough, Adult A cough helps to clear your throat and lungs. A cough may be a sign of an illness or another medical condition. An acute cough may only last 2-3 weeks, while a chronic cough may last 8 or more weeks. Many things can cause a cough. They include: Germs (viruses or bacteria) that attack the airway. Breathing in things that bother (irritate) your lungs. Allergies. Asthma. Mucus that runs down the back of your throat (postnasal drip). Smoking. Acid backing up from the stomach into the tube that moves food from the mouth to the stomach (gastroesophageal reflux). Some medicines. Lung problems. Other medical conditions, such as heart failure or a blood clot in the lung (pulmonary embolism). Follow these instructions at home: Medicines Take over-the-counter and prescription medicines only as told by your doctor. Talk with your doctor before you take medicines that stop a cough (cough suppressants). Lifestyle  Do not smoke, and try not to be around smoke. Do not use any products that contain nicotine or tobacco, such as cigarettes, e-cigarettes, and chewing tobacco. If you need help quitting, ask your doctor. Drink enough fluid to keep your pee (urine) pale yellow. Avoid caffeine. Do not drink alcohol if your doctor tells you not to drink. General instructions  Watch for any changes in your cough. Tell your doctor about them. Always cover your mouth when you cough. Stay away from things that make you cough, such as perfume, candles, campfire smoke, or cleaning products. If the air is dry, use a cool mist vaporizer or humidifier in your home. If your cough is worse at night, try using extra pillows to raise your head up higher while you sleep. Rest as needed. Keep all follow-up visits as told by your doctor. This is important. Contact a doctor if: You have new symptoms. You cough up pus. Your cough does not get better after 2-3 weeks, or your cough gets worse. Cough medicine  does not help your cough and you are not sleeping well. You have pain that gets worse or pain that is not helped with medicine. You have a fever. You are losing weight and you do not know why. You have night sweats. Get help right away if: You cough up blood. You have trouble breathing. Your heartbeat is very fast. These symptoms may be an emergency. Do not wait to see if the symptoms will go away. Get medical help right away. Call your local emergency services (911 in the [place] Do not drive yourself to the hospital. Summary A cough helps to clear your throat and lungs. Many things can cause a cough. Take over-the-counter and prescription medicines only as told by your doctor. Always cover your mouth when you cough. Contact a doctor if you have new symptoms or you have a cough that does not get better or gets worse. This information is not intended to replace advice given to you by your health care provider. Make sure you discuss any questions you have with your health care provider. Document Revised: 12/12/2019 Document Reviewed: 11/11/2018 Elsevier Patient Education  [place]. Amoxicillin; Clavulanic Acid Tablets What is this medication? AMOXICILLIN; CLAVULANIC ACID (a mox i SIL in; KLAV yoo lan ic AS id) treats infections caused by bacteria. It belongs to a group of medications called penicillin antibiotics. It will not treat colds, the flu, or infections caused by viruses. This medicine may be used for other purposes; ask your health care provider or pharmacist if you have questions. COMMON BRAND NAME(S): Augmentin What should  I tell my care team before I take this medication? They need to know if you have any of these conditions: Kidney disease Liver disease Mononucleosis Stomach or intestine problems such as colitis An unusual or allergic reaction to amoxicillin, other penicillin or cephalosporin antibiotics, clavulanic acid, other medications, foods, dyes, or  preservatives Pregnant or trying to get pregnant Breast-feeding How should I use this medication? Take this medication by mouth. Take it as directed on the prescription label at the same time every day. Take it with food at the start of a meal or snack. Take all of this medication unless your care team tells you to stop it early. Keep taking it even if you think you are better. Talk to your care team about the use of this medication in children. While it may be prescribed for selected conditions, precautions do apply. Overdosage: If you think you have taken too much of this medicine contact a poison control center or emergency room at once. NOTE: This medicine is only for you. Do not share this medicine with others. What if I miss a dose? If you miss a dose, take it as soon as you can. If it is almost time for your next dose, take only that dose. Do not take double or extra doses. What may interact with this medication? Allopurinol Anticoagulants Birth control pills Methotrexate Probenecid This list may not describe all possible interactions. Give your health care provider a list of all the medicines, herbs, non-prescription drugs, or dietary supplements you use. Also tell them if you smoke, drink alcohol, or use illegal drugs. Some items may interact with your medicine. What should I watch for while using this medication? Tell your care team if your symptoms do not start to get better or if they get worse. This medication may cause serious skin reactions. They can happen weeks to months after starting the medication. Contact your care team right away if you notice fevers or flu-like symptoms with a rash. The rash may be red or purple and then turn into blisters or peeling of the skin. Or, you might notice a red rash with swelling of the face, lips or lymph nodes in your neck or under your arms. Do not treat diarrhea with over the counter products. Contact your care team if you have diarrhea that  lasts more than 2 days or if it is severe and watery. If you have diabetes, you may get a false-positive result for sugar in your urine. Check with your care team. Birth control may not work properly while you are taking this medication. Talk to your care team about using an extra method of birth control. What side effects may I notice from receiving this medication? Side effects that you should report to your care team as soon as possible: Allergic reactions--skin rash, itching, hives, swelling of the face, lips, tongue, or throat Liver injury--right upper belly pain, loss of appetite, nausea, light-colored stool, dark yellow or brown urine, yellowing skin or eyes, unusual weakness or fatigue Redness, blistering, peeling, or loosening of the skin, including inside the mouth Severe diarrhea, fever Unusual vaginal discharge, itching, or odor Side effects that usually do not require medical attention (report to your care team if they continue or are bothersome): Diarrhea Nausea Vomiting This list may not describe all possible side effects. Call your doctor for medical advice about side effects. You may report side effects to FDA at 1-800-FDA-1088. Where should I keep my medication? Keep out of the reach of  children and pets. Store at room temperature between 20 and 25 degrees C (68 and 77 degrees F). Throw away any unused medication after the expiration date. NOTE: This sheet is a summary. It may not cover all possible information. If you have questions about this medicine, talk to your doctor, pharmacist, or health care provider.  2022 Elsevier/Gold Standard (2020-10-17 00:00:00) Otitis Media, Adult Otitis media occurs when there is inflammation and fluid in the middle ear with signs and symptoms of an acute infection. The middle ear is a part of the ear that contains bones for hearing as well as air that helps send sounds to the brain. When infected fluid builds up in this space, it causes  pressure and can lead to an ear infection. The eustachian tube connects the middle ear to the back of the nose (nasopharynx) and normally allows air into the middle ear. If the eustachian tube becomes blocked, fluid can build up and become infected. What are the causes? This condition is caused by a blockage in the eustachian tube. This can be caused by mucus or by swelling of the tube. Problems that can cause a blockage include: A cold or other upper respiratory infection. Allergies. An irritant, such as tobacco smoke. Enlarged adenoids. The adenoids are areas of soft tissue located high in the back of the throat, behind the nose and the roof of the mouth. They are part of the body's defense system (immune system). A mass in the nasopharynx. Damage to the ear caused by pressure changes (barotrauma). What increases the risk? You are more likely to develop this condition if you: Smoke or are exposed to tobacco smoke. Have an opening in the roof of your mouth (cleft palate). Have gastroesophageal reflux. Have an immune system disorder. What are the signs or symptoms? Symptoms of this condition include: Ear pain. Fever. Decreased hearing. Tiredness (lethargy). Fluid leaking from the ear, if the eardrum is ruptured or has burst. Ringing in the ear. How is this diagnosed? This condition is diagnosed with a physical exam. During the exam, your health care provider will use an instrument called an otoscope to look in your ear and check for redness, swelling, and fluid. He or she will also ask about your symptoms. Your health care provider may also order tests, such as: A pneumatic otoscopy. This is a test to check the movement of the eardrum. It is done by squeezing a small amount of air into the ear. A tympanogram. This is a test that shows how well the eardrum moves in response to air pressure in the ear canal. It provides a graph for your health care provider to review. How is this  treated? This condition can go away on its own within 3-5 days. But if the condition is caused by a bacterial infection and does not go away on its own, or if it keeps coming back, your health care provider may: Prescribe antibiotic medicine to treat the infection. Prescribe or recommend medicines to control pain. Follow these instructions at home: Take over-the-counter and prescription medicines only as told by your health care provider. If you were prescribed an antibiotic medicine, take it as told by your health care provider. Do not stop taking the antibiotic even if you start to feel better. Keep all follow-up visits. This is important. Contact a health care provider if: You have bleeding from your nose. There is a lump on your neck. You are not feeling better in 5 days. You feel worse instead of  better. Get help right away if: You have severe pain that is not controlled with medicine. You have swelling, redness, or pain around your ear. You have stiffness in your neck. A part of your face is not moving (paralyzed). The bone behind your ear (mastoid bone) is tender when you touch it. You develop a severe headache. Summary Otitis media is redness, soreness, and swelling of the middle ear, usually resulting in pain and decreased hearing. This condition can go away on its own within 3-5 days. If the problem does not go away in 3-5 days, your health care provider may give you medicines to treat the infection. If you were prescribed an antibiotic medicine, take it as told by your health care provider. Follow all instructions that were given to you by your health care provider. This information is not intended to replace advice given to you by your health care provider. Make sure you discuss any questions you have with your health care provider. Document Revised: 01/31/2021 Document Reviewed: 01/31/2021 Elsevier Patient Education  [place].

## 2022-01-05 LAB — CBC WITH DIFFERENTIAL/PLATELET
Basophils Absolute: 0.1 10*3/uL (ref 0.0–0.1)
Basophils Relative: 0.7 % (ref 0.0–3.0)
Eosinophils Absolute: 0.3 10*3/uL (ref 0.0–0.7)
Eosinophils Relative: 3.1 % (ref 0.0–5.0)
HCT: 37.1 % (ref 36.0–46.0)
Hemoglobin: 12.3 g/dL (ref 12.0–15.0)
Lymphocytes Relative: 21.9 % (ref 12.0–46.0)
Lymphs Abs: 2 10*3/uL (ref 0.7–4.0)
MCHC: 33 g/dL (ref 30.0–36.0)
MCV: 88.2 fl (ref 78.0–100.0)
Monocytes Absolute: 0.7 10*3/uL (ref 0.1–1.0)
Monocytes Relative: 8 % (ref 3.0–12.0)
Neutro Abs: 6 10*3/uL (ref 1.4–7.7)
Neutrophils Relative %: 66.3 % (ref 43.0–77.0)
Platelets: 330 10*3/uL (ref 150.0–400.0)
RBC: 4.21 Mil/uL (ref 3.87–5.11)
RDW: 13.7 % (ref 11.5–15.5)
WBC: 9 10*3/uL (ref 4.0–10.5)

## 2022-01-06 LAB — COVID-19, FLU A+B AND RSV
Influenza A, NAA: NOT DETECTED
Influenza B, NAA: NOT DETECTED
RSV, NAA: NOT DETECTED
SARS-CoV-2, NAA: NOT DETECTED

## 2022-01-07 LAB — CULTURE, GROUP A STREP: Strep A Culture: NEGATIVE

## 2022-01-09 ENCOUNTER — Ambulatory Visit: Payer: Self-pay | Admitting: Pharmacist

## 2022-01-09 NOTE — Progress Notes (Signed)
Chest x  ray within normal limits.  ?

## 2022-01-09 NOTE — Progress Notes (Signed)
Negative covid, flu, rsv and strep.  ?How is she feeling ?

## 2022-01-09 NOTE — Chronic Care Management (AMB) (Signed)
?  Chronic Care Management  ? ?Note ? ?01/09/2022 ?Name: Jody Wang MRN: [id number] DOB: 1946-04-18 ? ? ? ?Closing pharmacy CCM case at this time. Closing pharmacy CCM case at this time. Will collaborate with Care Guide to outreach to schedule follow up with RN CM. Patient has clinic contact information for future questions or concerns.  ? ?Catie Darnelle Maffucci, PharmD, [place], CPP ?Clinical Pharmacist ?Therapist, music at [facility] ?[phone] ? ?

## 2022-01-11 DIAGNOSIS — H6501 Acute serous otitis media, right ear: Secondary | ICD-10-CM | POA: Insufficient documentation

## 2022-01-20 ENCOUNTER — Telehealth: Payer: Self-pay

## 2022-01-20 NOTE — Chronic Care Management (AMB) (Signed)
?  Chronic Care Management  ? ?Note ? ?01/20/2022 ?Name: Jody Wang MRN: [id number] DOB: 1946/10/15 ? ?Jody Wang is a 76 y.o. year old female who is a primary care patient of Einar Pheasant, MD. Jody Wang is currently enrolled in care management services. An additional referral for RN CM  was placed.  ? ?Follow up plan: ?Unsuccessful telephone outreach attempt made. A HIPAA compliant phone message was left for the patient providing contact information and requesting a return call.  ?The care management team will reach out to the patient again over the next 7 days.  ?If patient returns call to provider office, please advise to call [facility]  at [phone] ? ?Noreene Larsson, RMA ?Care Guide, Embedded Care Coordination ?[facility]  ?[place],  [address] ?Direct Dial: [phone] ?[email or web address] ?Website: [email or web address]  ? ?

## 2022-01-24 ENCOUNTER — Telehealth: Payer: Medicare Other

## 2022-01-25 NOTE — Chronic Care Management (AMB) (Signed)
?  Chronic Care Management  ? ?Note ? ?01/25/2022 ?Name: Jody Wang MRN: [id number] DOB: 09/18/1946 ? ?Jody Wang is a 76 y.o. year old female who is a primary care patient of Einar Pheasant, MD. Jody Wang is currently enrolled in care management services. An additional referral for RN CM  was placed.  ? ?Follow up plan: ?Patient declines further follow up and engagement by the care management team. Appropriate care team members and provider have been notified via electronic communication.  ? ?Noreene Larsson, RMA ?Care Guide, Embedded Care Coordination ?[facility]  ?[place], [place] [address] ?Direct Dial: [phone] ?[email or web address] ?Website: [email or web address]  ? ?

## 2022-02-14 ENCOUNTER — Ambulatory Visit (INDEPENDENT_AMBULATORY_CARE_PROVIDER_SITE_OTHER): Payer: Medicare Other

## 2022-02-14 VITALS — Ht 61.0 in | Wt 135.0 lb

## 2022-02-14 DIAGNOSIS — M1711 Unilateral primary osteoarthritis, right knee: Secondary | ICD-10-CM | POA: Diagnosis not present

## 2022-02-14 DIAGNOSIS — M1712 Unilateral primary osteoarthritis, left knee: Secondary | ICD-10-CM | POA: Diagnosis not present

## 2022-02-14 DIAGNOSIS — Z0000 Encounter for general adult medical examination without abnormal findings: Secondary | ICD-10-CM | POA: Diagnosis not present

## 2022-02-14 DIAGNOSIS — M170 Bilateral primary osteoarthritis of knee: Secondary | ICD-10-CM | POA: Diagnosis not present

## 2022-02-14 NOTE — Patient Instructions (Addendum)
?  Jody Wang , ?Thank you for taking time to come for your Medicare Wellness Visit. I appreciate your ongoing commitment to your health goals. Please review the following plan we discussed and let me know if I can assist you in the future.  ? ?These are the goals we discussed: ? Goals   ? ?  ? Patient Stated  ?   DIET - INCREASE WATER INTAKE (pt-stated)   ?   Try to drink more water ?  ?   Medication Monitoring (pt-stated)   ?   Patient Goals/Self-Care Activities ?Over the next 90 days, patient will:  ?- take medications as prescribed ?check glucose daily, document, and provide at future appointments ?collaborate with provider on medication access solutions   ?  ?  ? Other  ?   Maintain Healthy Lifestyle   ?   Healthy diet ?Stay active, as tolerated ?  ? ?  ?  ?This is a list of the screening recommended for you and due dates:  ?Health Maintenance  ?Topic Date Due  ? COVID-19 Vaccine (4 - Booster for Pfizer series) 03/02/2022*  ? Eye exam for diabetics  04/04/2022*  ? Zoster (Shingles) Vaccine (1 of 2) 05/16/2022*  ? Tetanus Vaccine  02/15/2023*  ? Hemoglobin A1C  05/09/2022  ? Flu Shot  06/06/2022  ? Urine Protein Check  07/08/2022  ? Complete foot exam   07/12/2022  ? Pneumonia Vaccine  Completed  ? DEXA scan (bone density measurement)  Completed  ? Hepatitis C Screening: USPSTF Recommendation to screen - Ages 20-79 yo.  Completed  ? HPV Vaccine  Aged Out  ? Cologuard (Stool DNA test)  Discontinued  ?*Topic was postponed. The date shown is not the original due date.  ?  ?

## 2022-02-14 NOTE — Progress Notes (Signed)
Subjective:   Jody Wang is a 76 y.o. female who presents for Medicare Annual (Subsequent) preventive examination.  Review of Systems    No ROS.  Medicare Wellness Virtual Visit.  Visual/audio telehealth visit, UTA vital signs.   See social history for additional risk factors.   Cardiac Risk Factors include: advanced age (>24men, >94 women);diabetes mellitus;hypertension     Objective:    Today's Vitals   02/14/22 1328  Weight: 135 lb (61.2 kg)  Height: 5\' 1"  (1.549 m)   Body mass index is 25.51 kg/m.     02/14/2022    1:46 PM 02/11/2021    1:16 PM 02/11/2020    1:34 PM 02/10/2019   12:10 PM 02/08/2018    3:23 PM 04/13/2017   12:02 PM 11/30/2016    4:15 PM  Advanced Directives  Does Patient Have a Medical Advance Directive? No Yes No No No Yes No  Type of Advance Directive  Healthcare Power of [employer] Power of Attorney   Does patient want to make changes to medical advance directive?  No - Patient declined No - Patient declined    No - Patient declined  Would patient like information on creating a medical advance directive? No - Patient declined   No - Patient declined No - Patient declined  No - Patient declined    Current Medications (verified) Outpatient Encounter Medications as of 02/14/2022  Medication Sig   acetaminophen (TYLENOL) 500 MG tablet Take 1,000 mg by mouth in the morning, at noon, and at bedtime.   aspirin 81 MG chewable tablet Chew 81 mg by mouth daily.   benzonatate (TESSALON) 100 MG capsule Take 1 capsule (100 mg total) by mouth 2 (two) times daily as needed for cough.   blood glucose meter kit and supplies KIT Dispense based on patient and insurance preference. Use to check blood sugars twice daily. DX E11.9   cholecalciferol (VITAMIN D) 1000 units tablet Take 1,000 Units by mouth daily.   diclofenac (VOLTAREN) 50 MG EC tablet Take 1 tablet (50 mg total) by mouth 2 (two) times daily as needed.   Dulaglutide (TRULICITY) 3 MG/0.5ML SOPN  Inject 3 mg into the skin once a week.   FLUoxetine (PROZAC) 20 MG capsule Take 1 capsule (20 mg total) by mouth daily.   fluticasone (FLONASE) 50 MCG/ACT nasal spray USE 2 SPRAYS IN BOTH  NOSTRILS DAILY   glucose blood (ONETOUCH VERIO) test strip TEST BLOOD SUGAR TWICE DAILY AS DIRECTED   glucose blood test strip Use as instructed to check blood sugars to check blood sugars twice daily. Dx E11.9   levocetirizine (XYZAL) 5 MG tablet Take 1 tablet (5 mg total) by mouth daily.   levothyroxine (SYNTHROID) 88 MCG tablet Take 1 tablet (88 mcg total) by mouth daily.   metFORMIN (GLUCOPHAGE-XR) 500 MG 24 hr tablet Take 1 tablet (500 mg total) by mouth 2 (two) times daily.   mupirocin ointment (BACTROBAN) 2 % Apply 1 application topically 2 (two) times daily.   pantoprazole (PROTONIX) 40 MG tablet TAKE 1 TABLET BY MOUTH  DAILY   rosuvastatin (CRESTOR) 40 MG tablet Take 1 tablet (40 mg total) by mouth daily.   traZODone (DESYREL) 50 MG tablet Take 1 tablet (50 mg total) by mouth daily.   vitamin B-12 (CYANOCOBALAMIN) 1000 MCG tablet Take 1,000 mcg by mouth daily.   [DISCONTINUED] amoxicillin-clavulanate (AUGMENTIN) 875-125 MG tablet Take 1 tablet by mouth 2 (two) times daily.   No facility-administered encounter  medications on file as of 02/14/2022.    Allergies (verified) Codeine, Meperidine and related, Oxycontin [oxycodone hcl], Jardiance [empagliflozin], and Tape   History: Past Medical History:  Diagnosis Date   Depression    Diabetes mellitus without complication (HCC)    GERD (gastroesophageal reflux disease)    Hypercholesterolemia    Hypothyroidism    Osteoarthritis of knee    Wears dentures    partial lower   Past Surgical History:  Procedure Laterality Date   ABDOMINAL HYSTERECTOMY  1985   ovaries not removed   BREAST CYST EXCISION Right    BREAST CYST EXCISION Left    CATARACT EXTRACTION W/PHACO Right 02/16/2016   Procedure: CATARACT EXTRACTION PHACO AND INTRAOCULAR LENS  PLACEMENT (IOC) RIGHT ;  Surgeon: Lockie Mola, MD;  Location: [facility];  Service: Ophthalmology;  Laterality: Right;  DIABETIC - oral meds   CATARACT EXTRACTION W/PHACO Left 03/15/2016   Procedure: CATARACT EXTRACTION PHACO AND INTRAOCULAR LENS PLACEMENT (IOC)left eye;  Surgeon: Lockie Mola, MD;  Location: [facility];  Service: Ophthalmology;  Laterality: Left;  DIABETIC - oral meds   INNER EAR SURGERY     Family History  Problem Relation Age of Onset   Diabetes Father    Coronary artery disease Father        s/p CABG   Hypertension Father    Hypercholesterolemia Father    Hypertension Brother    Diabetes Brother    Hypertension Sister    Glaucoma Sister    Diabetes Other        niece   Breast cancer Maternal Aunt    Breast cancer Paternal Aunt    Cervical cancer Paternal Aunt    Breast cancer Cousin    Breast cancer Other    Social History   Socioeconomic History   Marital status: Widowed    Spouse name: Not on file   Number of children: 3   Years of education: Not on file   Highest education level: Not on file  Occupational History   Not on file  Tobacco Use   Smoking status: Former    Packs/day: 1.00    Years: 23.00    Pack years: 23.00    Types: Cigarettes    Quit date: 11/20/1983    Years since quitting: 38.2   Smokeless tobacco: Never  Vaping Use   Vaping Use: Never used  Substance and Sexual Activity   Alcohol use: Yes    Alcohol/week: 0.0 standard drinks    Comment: rare   Drug use: No   Sexual activity: Never  Other Topics Concern   Not on file  Social History Narrative   Not on file   Social Determinants of Health   Financial Resource Strain: Medium Risk   Difficulty of Paying Living Expenses: Somewhat hard  Food Insecurity: No Food Insecurity   Worried About Running Out of Food in the Last Year: Never true   Ran Out of Food in the Last Year: Never true  Transportation Needs: No Transportation Needs   Lack of  Transportation (Medical): No   Lack of Transportation (Non-Medical): No  Physical Activity: Not on file  Stress: No Stress Concern Present   Feeling of Stress : Not at all  Social Connections: Unknown   Frequency of Communication with Friends and Family: More than three times a week   Frequency of Social Gatherings with Friends and Family: More than three times a week   Attends Religious Services: More than 4 times per year  Active Member of Clubs or Organizations: Yes   Attends Banker Meetings: More than 4 times per year   Marital Status: Not on file    Tobacco Counseling Counseling given: Not Answered   Clinical Intake:  Pre-visit preparation completed: Yes        Diabetes: Yes (Followed by PCP)  How often do you need to have someone help you when you read instructions, pamphlets, or other written materials from your doctor or pharmacy?: 1 - Never    Interpreter Needed?: No    Activities of Daily Living    02/14/2022    1:37 PM  In your present state of health, do you have any difficulty performing the following activities:  Hearing? 1  Vision? 0  Difficulty concentrating or making decisions? 0  Walking or climbing stairs? 1  Dressing or bathing? 0  Doing errands, shopping? 0  Preparing Food and eating ? N  Using the Toilet? N  In the past six months, have you accidently leaked urine? Y  Do you have problems with loss of bowel control? N  Managing your Medications? Y  Managing your Finances? N  Housekeeping or managing your Housekeeping? Y   Patient Care Team: Dale [place], MD as PCP - General (Internal Medicine) Antonieta Iba, MD as Consulting Physician (Cardiology)  Indicate any recent Medical Services you may have received from other than [facility] providers in the past year (date may be approximate).     Assessment:   This is a routine wellness examination for Jody Wang.  Virtual Visit via Telephone Note  I connected with  Jody Wang on 02/14/22 at  1:15 PM EDT by telephone and verified that I am speaking with the correct person using two identifiers.  Persons participating in the virtual visit: patient/Nurse Health Advisor   I discussed the limitations of performing an evaluation and management service by telehealth. The patient expressed understanding and agreed to proceed. We continued and completed visit with audio only. Some vital signs may be absent or patient reported.   Hearing/Vision screen Hearing Screening - Comments::  L ear hearing aid Vision Screening - Comments:: Followed by [facility]  Cataract extraction, bilateral  They have regular follow up with the ophthalmologist  Dietary issues and exercise activities discussed:   Healthy diet Good water intake   Goals Addressed             This Visit's Progress    Maintain Healthy Lifestyle       Healthy diet Stay active, as tolerated       Depression Screen    02/14/2022    1:37 PM 07/12/2021    3:53 PM 03/08/2021    4:04 PM 02/11/2021    1:11 PM 10/04/2020    2:12 PM 02/11/2020    1:36 PM  PHQ 2/9 Scores  PHQ - 2 Score 0 0 0 0 0 0  PHQ- 9 Score  2 0  0     Fall Risk    02/14/2022    1:36 PM 02/11/2021    1:19 PM 02/11/2020    1:35 PM 02/10/2019   12:12 PM 02/08/2018    3:33 PM  Fall Risk   Falls in the past year? 0 0 0 0 No  Number falls in past yr: 0 0     Injury with Fall?  0     Follow up Falls evaluation completed Falls evaluation completed Falls evaluation completed      FALL RISK  PREVENTION PERTAINING TO THE HOME: Home free of loose throw rugs in walkways, pet beds, electrical cords, etc? Yes  Adequate lighting in your home to reduce risk of falls? Yes   ASSISTIVE DEVICES UTILIZED TO PREVENT FALLS:  Life alert? No  Use of a cane, walker or w/c? Yes  Grab bars in the bathroom? No  Shower chair or bench in shower? Yes  Elevated toilet seat or a handicapped toilet? Yes   TIMED UP AND GO: Was the test  performed? No .   Cognitive Function: Patient is alert and oriented x3.  Manages her own finances and medications.     10/04/2020    5:01 PM 11/30/2015    4:37 PM  MMSE - Mini Mental State Exam  Orientation to time 3 5  Orientation to Place 5 5  Registration 3 3  Attention/ Calculation 5 5  Recall 3 3  Language- name 2 objects 2 2  Language- repeat 1 1  Language- follow 3 step command 2 3  Language- read & follow direction 1 1  Write a sentence 1 1  Copy design 1 1  Total score 27 30        02/11/2021    1:34 PM 02/11/2020    1:36 PM 02/10/2019   12:15 PM 02/08/2018    3:40 PM 11/30/2016    4:16 PM  6CIT Screen  What Year? 0 points 0 points 0 points 0 points 0 points  What month? 0 points 0 points 0 points 0 points 0 points  What time? 0 points 0 points 0 points 0 points 0 points  Count back from 20 0 points 0 points 0 points 0 points 0 points  Months in reverse 0 points 0 points 0 points 0 points 0 points  Repeat phrase 0 points 0 points 0 points 0 points 0 points  Total Score 0 points 0 points 0 points 0 points 0 points    Immunizations Immunization History  Administered Date(s) Administered   Fluad Quad(high Dose 65+) 07/12/2021   Influenza Split 08/24/2013, 08/20/2014   Influenza, High Dose Seasonal PF 09/25/2016, 07/11/2018, 08/26/2019, 08/16/2020   Influenza,inj,Quad PF,6+ Mos 07/09/2015   Influenza-Unspecified 08/10/2017   PFIZER(Purple Top)SARS-COV-2 Vaccination 01/02/2020, 01/28/2020, 08/06/2020   Pneumococcal Conjugate-13 10/08/2015   Pneumococcal Polysaccharide-23 07/01/2013   Zoster, Live 08/24/2013   TDAP status: Due, Education has been provided regarding the importance of this vaccine. Advised may receive this vaccine at local pharmacy or Health Dept. Aware to provide a copy of the vaccination record if obtained from local pharmacy or Health Dept. Verbalized acceptance and understanding.  Shingrix Completed?: No.    Education has been provided regarding the  importance of this vaccine. Patient has been advised to call insurance company to determine out of pocket expense if they have not yet received this vaccine. Advised may also receive vaccine at local pharmacy or Health Dept. Verbalized acceptance and understanding.  Screening Tests Health Maintenance  Topic Date Due   COVID-19 Vaccine (4 - Booster for Pfizer series) 03/02/2022 (Originally 10/01/2020)   OPHTHALMOLOGY EXAM  04/04/2022 (Originally 07/05/2021)   Zoster Vaccines- Shingrix (1 of 2) 05/16/2022 (Originally 10/08/1965)   TETANUS/TDAP  02/15/2023 (Originally 10/08/1965)   HEMOGLOBIN A1C  05/09/2022   INFLUENZA VACCINE  06/06/2022   URINE MICROALBUMIN  07/08/2022   FOOT EXAM  07/12/2022   Pneumonia Vaccine 64+ Years old  Completed   DEXA SCAN  Completed   Hepatitis C Screening  Completed   HPV VACCINES  Aged  Out   Fecal DNA (Cologuard)  Discontinued   Health Maintenance There are no preventive care reminders to display for this patient.  Lung Cancer Screening: (Low Dose CT Chest recommended if Age 74-80 years, 30 pack-year currently smoking OR have quit w/in 15years.) does not qualify.   Vision Screening: Recommended annual ophthalmology exams for early detection of glaucoma and other disorders of the eye.  Dental Screening: Recommended annual dental exams for proper oral hygiene  Community Resource Referral / Chronic Care Management: CRR required this visit?  No   CCM required this visit?  No      Plan:   Keep all routine maintenance appointments.   I have personally reviewed and noted the following in the patient's chart:   Medical and social history Use of alcohol, tobacco or illicit drugs  Current medications and supplements including opioid prescriptions.  Functional ability and status Nutritional status Physical activity Advanced directives List of other physicians Hospitalizations, surgeries, and ER visits in previous 12 months Vitals Screenings to include  cognitive, depression, and falls Referrals and appointments  In addition, I have reviewed and discussed with patient certain preventive protocols, quality metrics, and best practice recommendations. A written personalized care plan for preventive services as well as general preventive health recommendations were provided to patient.     Ashok Pall, LPN   1/61/0960

## 2022-02-16 ENCOUNTER — Ambulatory Visit: Payer: Medicare HMO | Admitting: Internal Medicine

## 2022-02-19 DIAGNOSIS — M19019 Primary osteoarthritis, unspecified shoulder: Secondary | ICD-10-CM | POA: Insufficient documentation

## 2022-02-19 DIAGNOSIS — M1712 Unilateral primary osteoarthritis, left knee: Secondary | ICD-10-CM | POA: Insufficient documentation

## 2022-02-25 NOTE — Discharge Instructions (Signed)
Instructions after Total Knee Replacement   Brentlee Delage P. Staci Carver, Jr., M.D.     Dept. of Orthopaedics & Sports Medicine  [facility]  [address]  [place], [place]  [address]  Phone: [phone]   Fax: [phone]    DIET: Drink plenty of non-alcoholic fluids. Resume your normal diet. Include foods high in fiber.  ACTIVITY:  You may use crutches or a walker with weight-bearing as tolerated, unless instructed otherwise. You may be weaned off of the walker or crutches by your Physical Therapist.  Do NOT place pillows under the knee. Anything placed under the knee could limit your ability to straighten the knee.   Continue doing gentle exercises. Exercising will reduce the pain and swelling, increase motion, and prevent muscle weakness.   Please continue to use the TED compression stockings for 6 weeks. You may remove the stockings at night, but should reapply them in the morning. Do not drive or operate any equipment until instructed.  WOUND CARE:  Continue to use the PolarCare or ice packs periodically to reduce pain and swelling. You may bathe or shower after the staples are removed at the first office visit following surgery.  MEDICATIONS: You may resume your regular medications. Please take the pain medication as prescribed on the medication. Do not take pain medication on an empty stomach. You have been given a prescription for a blood thinner (Lovenox or Coumadin). Please take the medication as instructed. (NOTE: After completing a 2 week course of Lovenox, take one Enteric-coated aspirin once a day. This along with elevation will help reduce the possibility of phlebitis in your operated leg.) Do not drive or drink alcoholic beverages when taking pain medications.  CALL THE OFFICE FOR: Temperature above 101 degrees Excessive bleeding or drainage on the dressing. Excessive swelling, coldness, or paleness of the toes. Persistent nausea and vomiting.  FOLLOW-UP:  You  should have an appointment to return to the office in 10-14 days after surgery. Arrangements have been made for continuation of Physical Therapy (either home therapy or outpatient therapy).   [facility] Department Directory         [email or web address]       [email or web address]          Cardiology  Appointments: [place] - [phone] [place] - [phone]  Endocrinology  Appointments: [place] - [phone] [place] - [phone]  Gastroenterology  Appointments: [place] - [phone] [place] - [phone]        General Surgery   Appointments: [place] - [phone]  Internal Medicine/Family Medicine  Appointments: [place] - [phone] [place] - [phone] [place] - [phone]  Metabolic and Weigh Loss Surgery  Appointments: [place] - [phone]        Neurology  Appointments: [place] - [phone] [place] - [phone]  Neurosurgery  Appointments: [place] - [phone]  Obstetrics & Gynecology  Appointments: [place] - [phone] [place] - [phone]        Pediatrics  Appointments: [place] - [phone] [place] - [phone]  Physiatry  Appointments: [place] -[phone]  Physical Therapy  Appointments: [place] - [phone] [place] - [phone]        Podiatry  Appointments: [place] - [phone] [place] - [phone]  Pulmonology  Appointments: [place] - [phone]  Rheumatology  Appointments: [place] - [phone]        [place] Location: [facility]  [address] , [place]  [address]  [place] Location: [facility] [address] [place], [place]  [address]  [place] Location: [facility] [address] [place], [place]  [address]    

## 2022-03-06 ENCOUNTER — Encounter
Admission: RE | Admit: 2022-03-06 | Discharge: 2022-03-06 | Disposition: A | Discharge status: Home / Self Care | Payer: Medicare Other | Source: Ambulatory Visit | Attending: Orthopedic Surgery | Admitting: Orthopedic Surgery

## 2022-03-06 VITALS — BP 113/69 | HR 79 | Resp 16 | Ht 61.0 in | Wt 134.7 lb

## 2022-03-06 DIAGNOSIS — E1165 Type 2 diabetes mellitus with hyperglycemia: Secondary | ICD-10-CM | POA: Diagnosis not present

## 2022-03-06 DIAGNOSIS — Z01818 Encounter for other preprocedural examination: Secondary | ICD-10-CM | POA: Diagnosis not present

## 2022-03-06 DIAGNOSIS — M1711 Unilateral primary osteoarthritis, right knee: Secondary | ICD-10-CM | POA: Insufficient documentation

## 2022-03-06 DIAGNOSIS — D649 Anemia, unspecified: Secondary | ICD-10-CM | POA: Insufficient documentation

## 2022-03-06 HISTORY — DX: Actinic keratosis: L57.0

## 2022-03-06 HISTORY — DX: Other specified symptoms and signs involving the circulatory and respiratory systems: R09.89

## 2022-03-06 HISTORY — DX: Disorder of arteries and arterioles, unspecified: I77.9

## 2022-03-06 HISTORY — DX: Other nonspecific abnormal finding of lung field: R91.8

## 2022-03-06 HISTORY — DX: Anemia, unspecified: D64.9

## 2022-03-06 HISTORY — DX: Zoster without complications: B02.9

## 2022-03-06 HISTORY — DX: Other complications of anesthesia, initial encounter: T88.59XA

## 2022-03-06 HISTORY — DX: Atherosclerosis of aorta: I70.0

## 2022-03-06 LAB — URINALYSIS, ROUTINE W REFLEX MICROSCOPIC
Bacteria, UA: NONE SEEN
Bilirubin Urine: NEGATIVE
Glucose, UA: NEGATIVE mg/dL
Hgb urine dipstick: NEGATIVE
Ketones, ur: NEGATIVE mg/dL
Nitrite: NEGATIVE
Protein, ur: NEGATIVE mg/dL
Specific Gravity, Urine: 1.018 (ref 1.005–1.030)
pH: 5 (ref 5.0–8.0)

## 2022-03-06 LAB — COMPREHENSIVE METABOLIC PANEL
ALT: 11 U/L (ref 0–44)
AST: 14 U/L — ABNORMAL LOW (ref 15–41)
Albumin: 4.2 g/dL (ref 3.5–5.0)
Alkaline Phosphatase: 63 U/L (ref 38–126)
Anion gap: 10 (ref 5–15)
BUN: 9 mg/dL (ref 8–23)
CO2: 23 mmol/L (ref 22–32)
Calcium: 8.9 mg/dL (ref 8.9–10.3)
Chloride: 103 mmol/L (ref 98–111)
Creatinine, Ser: 0.7 mg/dL (ref 0.44–1.00)
GFR, Estimated: >60 mL/min (ref >60)
Glucose, Bld: 125 mg/dL — ABNORMAL HIGH (ref 70–99)
Potassium: 4.5 mmol/L (ref 3.5–5.1)
Sodium: 136 mmol/L (ref 135–145)
Total Bilirubin: 0.5 mg/dL (ref 0.3–1.2)
Total Protein: 7.4 g/dL (ref 6.5–8.1)

## 2022-03-06 LAB — SEDIMENTATION RATE: Sed Rate: 9 mm/hr (ref 0–30)

## 2022-03-06 LAB — CBC
HCT: 41.4 % (ref 36.0–46.0)
Hemoglobin: 13.1 g/dL (ref 12.0–15.0)
MCH: 27.7 pg (ref 26.0–34.0)
MCHC: 31.6 g/dL (ref 30.0–36.0)
MCV: 87.5 fL (ref 80.0–100.0)
Platelets: 359 10*3/uL (ref 150–400)
RBC: 4.73 MIL/uL (ref 3.87–5.11)
RDW: 13.2 % (ref 11.5–15.5)
WBC: 8 10*3/uL (ref 4.0–10.5)
nRBC: 0 % (ref 0.0–0.2)

## 2022-03-06 LAB — C-REACTIVE PROTEIN: CRP: 0.5 mg/dL (ref <1.0)

## 2022-03-06 LAB — TYPE AND SCREEN
ABO/RH(D): A POS
Antibody Screen: NEGATIVE

## 2022-03-06 LAB — HEMOGLOBIN A1C
Hgb A1c MFr Bld: 7.3 % — ABNORMAL HIGH (ref 4.8–5.6)
Mean Plasma Glucose: 162.81 mg/dL

## 2022-03-06 LAB — SURGICAL PCR SCREEN
MRSA, PCR: NEGATIVE
Staphylococcus aureus: NEGATIVE

## 2022-03-06 NOTE — Patient Instructions (Addendum)
Your procedure is scheduled on:03-17-22 Friday ?Report to the Registration Desk on the 1st floor of the [facility].Then proceed to the 2nd floor Surgery Desk  ?To find out your arrival time, please call [phone] between 1PM - 3PM on:03-16-22 Thursday ?If your arrival time is 6:00 am, do not arrive prior to that time as the [facility] entrance doors do not open until 6:00 am. ? ?REMEMBER: ?Instructions that are not followed completely may result in serious medical risk, up to and including death; or upon the discretion of your surgeon and anesthesiologist your surgery may need to be rescheduled. ? ?Do not eat food after midnight the night before surgery.  ?No gum chewing, lozengers or hard candies. ? ?You may however, drink Water up to 2 hours before you are scheduled to arrive for your surgery. Do not drink anything within 2 hours of your scheduled arrival time. ? ?Type 1 and Type 2 diabetics should only drink water. ? ?In addition, your doctor has ordered for you to drink the provided  ?Gatorade G2 ?Drinking this carbohydrate drink up to two hours before surgery helps to reduce insulin resistance and improve patient outcomes. Please complete drinking 2 hours prior to scheduled arrival time. ? ?TAKE THESE MEDICATIONS THE MORNING OF SURGERY WITH A SIP OF WATER: ?-FLUoxetine (PROZAC)  ?-levocetirizine (XYZAL)  ?-levothyroxine (SYNTHROID) ?-rosuvastatin (CRESTOR)  ?-pantoprazole (PROTONIX)-take one the night before and one on the morning of surgery - helps to prevent nausea after surgery.) ? ?Continue your 81 mg Aspirin as instructed by Dr Mammie Lorenzo NOT take the day of surgery ? ?Stop Metformin 2 days prior to surgery -Last dose on 03-14-22 Tuesday ? ?One week prior to surgery:(Last dose on 03-09-22) ?Stop Anti-inflammatories (NSAIDS) such as Advil, Aleve, Ibuprofen, Motrin, Naproxen, Naprosyn and Aspirin based products such as Excedrin, Goodys Powder, BC Powder.You may however, continue to take Tylenol if needed  for pain up until the day of surgery. ?Stop ANY OVER THE COUNTER supplements until after surgery (vitamin B-12 ,VITAMIN D) ? ?No Alcohol for 24 hours before or after surgery. ? ?No Smoking including e-cigarettes for 24 hours prior to surgery.  ?No chewable tobacco products for at least 6 hours prior to surgery.  ?No nicotine patches on the day of surgery. ? ?Do not use any "recreational" drugs for at least a week prior to your surgery.  ?Please be advised that the combination of cocaine and anesthesia may have negative outcomes, up to and including death. ?If you test positive for cocaine, your surgery will be cancelled. ? ?On the morning of surgery brush your teeth with toothpaste and water, you may rinse your mouth with mouthwash if you wish. ?Do not swallow any toothpaste or mouthwash. ? ?Use CHG Soap as directed on instruction sheet. ? ?Do not wear jewelry, make-up, hairpins, clips or nail polish. ? ?Do not wear lotions, powders, or perfumes.  ? ?Do not shave body from the neck down 48 hours prior to surgery just in case you cut yourself which could leave a site for infection.  ?Also, freshly shaved skin may become irritated if using the CHG soap. ? ?Contact lenses, hearing aids and dentures may not be worn into surgery. ? ?Do not bring valuables to the hospital. [facility] is not responsible for any missing/lost belongings or valuables.  ? ?Notify your doctor if there is any change in your medical condition (cold, fever, infection). ? ?Wear comfortable clothing (specific to your surgery type) to the hospital. ? ?After surgery, you can  help prevent lung complications by doing breathing exercises.  ?Take deep breaths and cough every 1-2 hours. Your doctor may order a device called an Incentive Spirometer to help you take deep breaths. ?When coughing or sneezing, hold a pillow firmly against your incision with both hands. This is called ?splinting.? Doing this helps protect your incision. It also decreases belly  discomfort. ? ?If you are being admitted to the hospital overnight, leave your suitcase in the car. ?After surgery it may be brought to your room. ? ?If you are being discharged the day of surgery, you will not be allowed to drive home. ?You will need a responsible adult (18 years or older) to drive you home and stay with you that night.  ? ?If you are taking public transportation, you will need to have a responsible adult (18 years or older) with you. ?Please confirm with your physician that it is acceptable to use public transportation.  ? ?Please call the [facility]. at [phone] if you have any questions about these instructions. ? ?Surgery Visitation Policy: ? ?Patients undergoing a surgery or procedure may have two family members or support persons with them as long as the person is not COVID-19 positive or experiencing its symptoms.  ? ?Inpatient Visitation:   ? ?Visiting hours are 7 a.m. to 8 p.m. ?Up to four visitors are allowed at one time in a patient room, including children. The visitors may rotate out with other people during the day. One designated support person (adult) may remain overnight.  ?

## 2022-03-07 DIAGNOSIS — M1711 Unilateral primary osteoarthritis, right knee: Secondary | ICD-10-CM | POA: Diagnosis not present

## 2022-03-13 ENCOUNTER — Other Ambulatory Visit: Payer: Self-pay

## 2022-03-13 ENCOUNTER — Telehealth: Payer: Self-pay

## 2022-03-13 MED ORDER — FLUTICASONE PROPIONATE 50 MCG/ACT NA SUSP: NASAL | 3 refills | Status: DC

## 2022-03-13 NOTE — Telephone Encounter (Signed)
Patient called to ask Dr. Einar Pheasant to call in a prescription for flonase.  Patient said it is mail order with [employer] and she also said that she is close to running out. ?

## 2022-03-13 NOTE — Telephone Encounter (Signed)
sent 

## 2022-03-17 ENCOUNTER — Ambulatory Visit: Payer: Medicare Other | Admitting: Urgent Care

## 2022-03-17 ENCOUNTER — Observation Stay: Payer: Medicare Other

## 2022-03-17 ENCOUNTER — Observation Stay
Admission: RE | Admit: 2022-03-17 | Discharge: 2022-03-18 | Disposition: A | Discharge status: Home Health | Payer: Medicare Other | Attending: Orthopedic Surgery | Admitting: Orthopedic Surgery

## 2022-03-17 ENCOUNTER — Encounter: Payer: Self-pay | Admitting: Orthopedic Surgery

## 2022-03-17 ENCOUNTER — Encounter
Admission: RE | Disposition: A | Discharge status: Home Health | Payer: Self-pay | Source: Home / Self Care | Attending: Orthopedic Surgery

## 2022-03-17 ENCOUNTER — Other Ambulatory Visit: Payer: Self-pay

## 2022-03-17 DIAGNOSIS — E7800 Pure hypercholesterolemia, unspecified: Secondary | ICD-10-CM | POA: Diagnosis not present

## 2022-03-17 DIAGNOSIS — E039 Hypothyroidism, unspecified: Secondary | ICD-10-CM | POA: Insufficient documentation

## 2022-03-17 DIAGNOSIS — Z79899 Other long term (current) drug therapy: Secondary | ICD-10-CM | POA: Diagnosis not present

## 2022-03-17 DIAGNOSIS — M1711 Unilateral primary osteoarthritis, right knee: Secondary | ICD-10-CM | POA: Diagnosis not present

## 2022-03-17 DIAGNOSIS — Z794 Long term (current) use of insulin: Secondary | ICD-10-CM | POA: Diagnosis not present

## 2022-03-17 DIAGNOSIS — Z96651 Presence of right artificial knee joint: Secondary | ICD-10-CM

## 2022-03-17 DIAGNOSIS — Z96659 Presence of unspecified artificial knee joint: Secondary | ICD-10-CM

## 2022-03-17 DIAGNOSIS — D649 Anemia, unspecified: Secondary | ICD-10-CM

## 2022-03-17 DIAGNOSIS — E1165 Type 2 diabetes mellitus with hyperglycemia: Secondary | ICD-10-CM

## 2022-03-17 DIAGNOSIS — I2510 Atherosclerotic heart disease of native coronary artery without angina pectoris: Secondary | ICD-10-CM | POA: Diagnosis not present

## 2022-03-17 DIAGNOSIS — E119 Type 2 diabetes mellitus without complications: Secondary | ICD-10-CM | POA: Insufficient documentation

## 2022-03-17 DIAGNOSIS — Z7984 Long term (current) use of oral hypoglycemic drugs: Secondary | ICD-10-CM | POA: Diagnosis not present

## 2022-03-17 DIAGNOSIS — Z7982 Long term (current) use of aspirin: Secondary | ICD-10-CM | POA: Diagnosis not present

## 2022-03-17 HISTORY — PX: KNEE ARTHROPLASTY: SHX992

## 2022-03-17 LAB — ABO/RH: ABO/RH(D): A POS

## 2022-03-17 LAB — GLUCOSE, CAPILLARY
Glucose-Capillary: 160 mg/dL — ABNORMAL HIGH (ref 70–99)
Glucose-Capillary: 219 mg/dL — ABNORMAL HIGH (ref 70–99)
Glucose-Capillary: 233 mg/dL — ABNORMAL HIGH (ref 70–99)
Glucose-Capillary: 237 mg/dL — ABNORMAL HIGH (ref 70–99)
Glucose-Capillary: 334 mg/dL — ABNORMAL HIGH (ref 70–99)

## 2022-03-17 SURGERY — ARTHROPLASTY, KNEE, TOTAL, USING IMAGELESS COMPUTER-ASSISTED NAVIGATION
Anesthesia: Spinal | Site: Knee | Laterality: Right

## 2022-03-17 MED ORDER — DEXMEDETOMIDINE (PRECEDEX) IN NS 20 MCG/5ML (4 MCG/ML) IV SYRINGE
PREFILLED_SYRINGE | INTRAVENOUS | Status: DC | PRN
  Administered 2022-03-17 (×2): 4 ug via INTRAVENOUS

## 2022-03-17 MED ORDER — CEFAZOLIN SODIUM-DEXTROSE 2-4 GM/100ML-% IV SOLN
INTRAVENOUS | Status: AC
  Administered 2022-03-17: 2 g via INTRAVENOUS
  Filled 2022-03-17: qty 100

## 2022-03-17 MED ORDER — ROSUVASTATIN CALCIUM 10 MG PO TABS
40.0000 mg | ORAL_TABLET | Freq: Every day | ORAL | Status: DC
  Administered 2022-03-18: 40 mg via ORAL
  Filled 2022-03-17: qty 4

## 2022-03-17 MED ORDER — PHENYLEPHRINE HCL-NACL 20-0.9 MG/250ML-% IV SOLN
INTRAVENOUS | Status: DC | PRN
  Administered 2022-03-17: 20 ug/min via INTRAVENOUS

## 2022-03-17 MED ORDER — ACETAMINOPHEN 10 MG/ML IV SOLN
INTRAVENOUS | Status: AC
  Filled 2022-03-17: qty 100

## 2022-03-17 MED ORDER — SODIUM CHLORIDE 0.9 % IR SOLN
Status: DC | PRN
Start: 2022-03-17 — End: 2022-03-17
  Administered 2022-03-17: 3000 mL

## 2022-03-17 MED ORDER — SODIUM CHLORIDE (PF) 0.9 % IJ SOLN
INTRAMUSCULAR | Status: DC | PRN
  Administered 2022-03-17: 120 mL via INTRAMUSCULAR

## 2022-03-17 MED ORDER — DEXAMETHASONE SODIUM PHOSPHATE 10 MG/ML IJ SOLN
INTRAMUSCULAR | Status: AC
Start: 2022-03-17 — End: 2022-03-17
  Administered 2022-03-17: 8 mg via INTRAVENOUS
  Filled 2022-03-17: qty 1

## 2022-03-17 MED ORDER — BUPIVACAINE HCL (PF) 0.5 % IJ SOLN
INTRAMUSCULAR | Status: DC | PRN
  Administered 2022-03-17: 3 mL via INTRATHECAL

## 2022-03-17 MED ORDER — KETAMINE HCL 10 MG/ML IJ SOLN
INTRAMUSCULAR | Status: DC | PRN
Start: 2022-03-17 — End: 2022-03-17
  Administered 2022-03-17: 10 mg via INTRAVENOUS

## 2022-03-17 MED ORDER — BUPIVACAINE HCL (PF) 0.25 % IJ SOLN
INTRAMUSCULAR | Status: AC
  Filled 2022-03-17: qty 120

## 2022-03-17 MED ORDER — MIDAZOLAM HCL 2 MG/2ML IJ SOLN
INTRAMUSCULAR | Status: AC
  Filled 2022-03-17: qty 2

## 2022-03-17 MED ORDER — KETAMINE HCL 50 MG/5ML IJ SOSY
PREFILLED_SYRINGE | INTRAMUSCULAR | Status: AC
  Filled 2022-03-17: qty 5

## 2022-03-17 MED ORDER — ENOXAPARIN SODIUM 30 MG/0.3ML IJ SOSY
30.0000 mg | PREFILLED_SYRINGE | Freq: Two times a day (BID) | INTRAMUSCULAR | Status: DC
  Administered 2022-03-18: 30 mg via SUBCUTANEOUS
  Filled 2022-03-17: qty 0.3

## 2022-03-17 MED ORDER — ONDANSETRON HCL 4 MG PO TABS
4.0000 mg | ORAL_TABLET | Freq: Four times a day (QID) | ORAL | Status: DC | PRN
Start: 2022-03-17 — End: 2022-03-18

## 2022-03-17 MED ORDER — VITAMIN D 25 MCG (1000 UNIT) PO TABS
1000.0000 [IU] | ORAL_TABLET | Freq: Every day | ORAL | Status: DC
  Administered 2022-03-18: 1000 [IU] via ORAL
  Filled 2022-03-17: qty 1

## 2022-03-17 MED ORDER — PHENOL 1.4 % MT LIQD: 1.0000 | OROMUCOSAL | Status: DC | PRN

## 2022-03-17 MED ORDER — METFORMIN HCL ER 500 MG PO TB24
1000.0000 mg | ORAL_TABLET | Freq: Every day | ORAL | Status: DC
  Administered 2022-03-18: 1000 mg via ORAL
  Filled 2022-03-17: qty 2

## 2022-03-17 MED ORDER — FLUTICASONE PROPIONATE 50 MCG/ACT NA SUSP
2.0000 | Freq: Every day | NASAL | Status: DC
  Administered 2022-03-18: 2 via NASAL
  Filled 2022-03-17: qty 16

## 2022-03-17 MED ORDER — GABAPENTIN 300 MG PO CAPS
ORAL_CAPSULE | ORAL | Status: AC
  Administered 2022-03-17: 300 mg via ORAL
  Filled 2022-03-17: qty 1

## 2022-03-17 MED ORDER — MIDAZOLAM HCL 5 MG/5ML IJ SOLN
INTRAMUSCULAR | Status: DC | PRN
  Administered 2022-03-17: 1 mg via INTRAVENOUS

## 2022-03-17 MED ORDER — DULAGLUTIDE 3 MG/0.5ML ~~LOC~~ SOAJ: 3.0000 mg | SUBCUTANEOUS | Status: DC

## 2022-03-17 MED ORDER — 0.9 % SODIUM CHLORIDE (POUR BTL) OPTIME
TOPICAL | Status: DC | PRN
Start: 2022-03-17 — End: 2022-03-17
  Administered 2022-03-17: 500 mL

## 2022-03-17 MED ORDER — GLYCOPYRROLATE 0.2 MG/ML IJ SOLN
INTRAMUSCULAR | Status: DC | PRN
  Administered 2022-03-17: .2 mg via INTRAVENOUS

## 2022-03-17 MED ORDER — FENTANYL CITRATE (PF) 100 MCG/2ML IJ SOLN: 25.0000 ug | INTRAMUSCULAR | Status: DC | PRN

## 2022-03-17 MED ORDER — PROPOFOL 1000 MG/100ML IV EMUL
INTRAVENOUS | Status: AC
  Filled 2022-03-17: qty 100

## 2022-03-17 MED ORDER — FLEET ENEMA 7-19 GM/118ML RE ENEM: 1.0000 | ENEMA | Freq: Once | RECTAL | Status: DC | PRN

## 2022-03-17 MED ORDER — HYDROMORPHONE HCL 1 MG/ML IJ SOLN: 0.5000 mg | INTRAMUSCULAR | Status: DC | PRN

## 2022-03-17 MED ORDER — TRANEXAMIC ACID-NACL 1000-0.7 MG/100ML-% IV SOLN
INTRAVENOUS | Status: AC
  Administered 2022-03-17: 1000 mg via INTRAVENOUS
  Filled 2022-03-17: qty 100

## 2022-03-17 MED ORDER — PROPOFOL 10 MG/ML IV BOLUS
INTRAVENOUS | Status: AC
  Filled 2022-03-17: qty 40

## 2022-03-17 MED ORDER — GABAPENTIN 300 MG PO CAPS: 300.0000 mg | ORAL_CAPSULE | Freq: Once | ORAL | Status: AC

## 2022-03-17 MED ORDER — PROPOFOL 10 MG/ML IV BOLUS
INTRAVENOUS | Status: DC | PRN
  Administered 2022-03-17 (×3): 30 mg via INTRAVENOUS

## 2022-03-17 MED ORDER — OXYCODONE HCL 5 MG PO TABS: 10.0000 mg | ORAL_TABLET | ORAL | Status: DC | PRN

## 2022-03-17 MED ORDER — ORAL CARE MOUTH RINSE: 15.0000 mL | Freq: Once | OROMUCOSAL | Status: AC

## 2022-03-17 MED ORDER — SENNOSIDES-DOCUSATE SODIUM 8.6-50 MG PO TABS
1.0000 | ORAL_TABLET | Freq: Two times a day (BID) | ORAL | Status: DC
  Administered 2022-03-17 – 2022-03-18 (×2): 1 via ORAL
  Filled 2022-03-17 (×2): qty 1

## 2022-03-17 MED ORDER — ONDANSETRON HCL 4 MG/2ML IJ SOLN: 4.0000 mg | Freq: Four times a day (QID) | INTRAMUSCULAR | Status: DC | PRN

## 2022-03-17 MED ORDER — MENTHOL 3 MG MT LOZG: 1.0000 | LOZENGE | OROMUCOSAL | Status: DC | PRN

## 2022-03-17 MED ORDER — SODIUM CHLORIDE 0.9 % IV SOLN: INTRAVENOUS | Status: DC

## 2022-03-17 MED ORDER — ALUM & MAG HYDROXIDE-SIMETH 200-200-20 MG/5ML PO SUSP: 30.0000 mL | ORAL | Status: DC | PRN

## 2022-03-17 MED ORDER — TRAZODONE HCL 50 MG PO TABS
50.0000 mg | ORAL_TABLET | Freq: Every day | ORAL | Status: DC
  Administered 2022-03-17: 50 mg via ORAL
  Filled 2022-03-17: qty 1

## 2022-03-17 MED ORDER — BUPIVACAINE LIPOSOME 1.3 % IJ SUSP
INTRAMUSCULAR | Status: AC
  Filled 2022-03-17: qty 40

## 2022-03-17 MED ORDER — INSULIN ASPART 100 UNIT/ML IJ SOLN
0.0000 [IU] | Freq: Three times a day (TID) | INTRAMUSCULAR | Status: DC
  Administered 2022-03-17 – 2022-03-18 (×2): 5 [IU] via SUBCUTANEOUS
  Administered 2022-03-18: 3 [IU] via SUBCUTANEOUS
  Filled 2022-03-17 (×3): qty 1

## 2022-03-17 MED ORDER — CELECOXIB 200 MG PO CAPS: 200.0000 mg | ORAL_CAPSULE | Freq: Two times a day (BID) | ORAL | Status: DC

## 2022-03-17 MED ORDER — CELECOXIB 200 MG PO CAPS
ORAL_CAPSULE | ORAL | Status: AC
  Administered 2022-03-17: 400 mg via ORAL
  Filled 2022-03-17: qty 2

## 2022-03-17 MED ORDER — DEXAMETHASONE SODIUM PHOSPHATE 10 MG/ML IJ SOLN: 8.0000 mg | Freq: Once | INTRAMUSCULAR | Status: AC

## 2022-03-17 MED ORDER — SURGIPHOR WOUND IRRIGATION SYSTEM - OPTIME
TOPICAL | Status: DC | PRN
  Administered 2022-03-17: 1

## 2022-03-17 MED ORDER — TRANEXAMIC ACID-NACL 1000-0.7 MG/100ML-% IV SOLN
INTRAVENOUS | Status: AC
  Filled 2022-03-17: qty 100

## 2022-03-17 MED ORDER — LORATADINE 10 MG PO TABS
10.0000 mg | ORAL_TABLET | Freq: Every day | ORAL | Status: DC
  Administered 2022-03-18: 10 mg via ORAL
  Filled 2022-03-17: qty 1

## 2022-03-17 MED ORDER — INSULIN ASPART 100 UNIT/ML IJ SOLN
0.0000 [IU] | Freq: Every day | INTRAMUSCULAR | Status: DC
  Administered 2022-03-17: 4 [IU] via SUBCUTANEOUS
  Filled 2022-03-17: qty 1

## 2022-03-17 MED ORDER — CELECOXIB 200 MG PO CAPS
200.0000 mg | ORAL_CAPSULE | Freq: Two times a day (BID) | ORAL | Status: DC
  Administered 2022-03-17 – 2022-03-18 (×2): 200 mg via ORAL
  Filled 2022-03-17 (×2): qty 1

## 2022-03-17 MED ORDER — INSULIN ASPART 100 UNIT/ML IJ SOLN
INTRAMUSCULAR | Status: AC
  Administered 2022-03-17: 4 [IU] via SUBCUTANEOUS
  Filled 2022-03-17: qty 1

## 2022-03-17 MED ORDER — CHLORHEXIDINE GLUCONATE 0.12 % MT SOLN: 15.0000 mL | Freq: Once | OROMUCOSAL | Status: AC

## 2022-03-17 MED ORDER — ESMOLOL HCL 100 MG/10ML IV SOLN
INTRAVENOUS | Status: DC | PRN
  Administered 2022-03-17: 10 mg via INTRAVENOUS

## 2022-03-17 MED ORDER — LEVOTHYROXINE SODIUM 88 MCG PO TABS
88.0000 ug | ORAL_TABLET | Freq: Every day | ORAL | Status: DC
  Administered 2022-03-18: 88 ug via ORAL
  Filled 2022-03-17: qty 1

## 2022-03-17 MED ORDER — TRAMADOL HCL 50 MG PO TABS
50.0000 mg | ORAL_TABLET | ORAL | Status: DC | PRN
  Administered 2022-03-17 (×2): 100 mg via ORAL
  Administered 2022-03-18: 50 mg via ORAL
  Filled 2022-03-17: qty 1
  Filled 2022-03-17 (×2): qty 2

## 2022-03-17 MED ORDER — TRANEXAMIC ACID-NACL 1000-0.7 MG/100ML-% IV SOLN
1000.0000 mg | INTRAVENOUS | Status: AC
  Administered 2022-03-17: 1000 mg via INTRAVENOUS

## 2022-03-17 MED ORDER — TRANEXAMIC ACID-NACL 1000-0.7 MG/100ML-% IV SOLN: 1000.0000 mg | Freq: Once | INTRAVENOUS | Status: AC

## 2022-03-17 MED ORDER — CHLORHEXIDINE GLUCONATE 4 % EX LIQD: 60.0000 mL | Freq: Once | CUTANEOUS | Status: DC

## 2022-03-17 MED ORDER — SODIUM CHLORIDE FLUSH 0.9 % IV SOLN
INTRAVENOUS | Status: AC
  Filled 2022-03-17: qty 80

## 2022-03-17 MED ORDER — METOCLOPRAMIDE HCL 10 MG PO TABS
10.0000 mg | ORAL_TABLET | Freq: Three times a day (TID) | ORAL | Status: DC
  Administered 2022-03-17 – 2022-03-18 (×4): 10 mg via ORAL
  Filled 2022-03-17 (×8): qty 1

## 2022-03-17 MED ORDER — FERROUS SULFATE 325 (65 FE) MG PO TABS
325.0000 mg | ORAL_TABLET | Freq: Two times a day (BID) | ORAL | Status: DC
  Administered 2022-03-17 – 2022-03-18 (×2): 325 mg via ORAL
  Filled 2022-03-17 (×2): qty 1

## 2022-03-17 MED ORDER — CEFAZOLIN SODIUM-DEXTROSE 2-4 GM/100ML-% IV SOLN
2.0000 g | INTRAVENOUS | Status: AC
  Administered 2022-03-17: 2 g via INTRAVENOUS

## 2022-03-17 MED ORDER — FLUOXETINE HCL 20 MG PO CAPS
20.0000 mg | ORAL_CAPSULE | Freq: Every day | ORAL | Status: DC
Start: 2022-03-18 — End: 2022-03-18
  Administered 2022-03-18: 20 mg via ORAL
  Filled 2022-03-17: qty 1

## 2022-03-17 MED ORDER — PROPOFOL 500 MG/50ML IV EMUL
INTRAVENOUS | Status: DC | PRN
  Administered 2022-03-17: 15 ug/kg/min via INTRAVENOUS

## 2022-03-17 MED ORDER — ACETAMINOPHEN 10 MG/ML IV SOLN
1000.0000 mg | Freq: Four times a day (QID) | INTRAVENOUS | Status: AC
  Administered 2022-03-17 – 2022-03-18 (×4): 1000 mg via INTRAVENOUS
  Filled 2022-03-17 (×4): qty 100

## 2022-03-17 MED ORDER — ACETAMINOPHEN 10 MG/ML IV SOLN
INTRAVENOUS | Status: DC | PRN
  Administered 2022-03-17: 1000 mg via INTRAVENOUS

## 2022-03-17 MED ORDER — DIPHENHYDRAMINE HCL 12.5 MG/5ML PO ELIX: 12.5000 mg | ORAL_SOLUTION | ORAL | Status: DC | PRN

## 2022-03-17 MED ORDER — INSULIN ASPART 100 UNIT/ML IJ SOLN: 4.0000 [IU] | Freq: Once | INTRAMUSCULAR | Status: AC

## 2022-03-17 MED ORDER — ONDANSETRON HCL 4 MG/2ML IJ SOLN
INTRAMUSCULAR | Status: DC | PRN
  Administered 2022-03-17: 4 mg via INTRAVENOUS

## 2022-03-17 MED ORDER — VITAMIN B-12 1000 MCG PO TABS
1000.0000 ug | ORAL_TABLET | Freq: Every day | ORAL | Status: DC
  Administered 2022-03-18: 1000 ug via ORAL
  Filled 2022-03-17: qty 1

## 2022-03-17 MED ORDER — BISACODYL 10 MG RE SUPP
10.0000 mg | Freq: Every day | RECTAL | Status: DC | PRN
  Filled 2022-03-17: qty 1

## 2022-03-17 MED ORDER — ACETAMINOPHEN 325 MG PO TABS: 325.0000 mg | ORAL_TABLET | Freq: Four times a day (QID) | ORAL | Status: DC | PRN

## 2022-03-17 MED ORDER — PANTOPRAZOLE SODIUM 40 MG PO TBEC
40.0000 mg | DELAYED_RELEASE_TABLET | Freq: Two times a day (BID) | ORAL | Status: DC
  Administered 2022-03-17 – 2022-03-18 (×2): 40 mg via ORAL
  Filled 2022-03-17 (×2): qty 1

## 2022-03-17 MED ORDER — CEFAZOLIN SODIUM-DEXTROSE 2-4 GM/100ML-% IV SOLN
2.0000 g | Freq: Four times a day (QID) | INTRAVENOUS | Status: AC
  Administered 2022-03-17: 2 g via INTRAVENOUS
  Filled 2022-03-17 (×2): qty 100

## 2022-03-17 MED ORDER — PHENYLEPHRINE HCL (PRESSORS) 10 MG/ML IV SOLN
INTRAVENOUS | Status: AC
  Filled 2022-03-17: qty 1

## 2022-03-17 MED ORDER — FENTANYL CITRATE (PF) 100 MCG/2ML IJ SOLN
INTRAMUSCULAR | Status: AC
  Filled 2022-03-17: qty 2

## 2022-03-17 MED ORDER — OXYCODONE HCL 5 MG PO TABS: 5.0000 mg | ORAL_TABLET | ORAL | Status: DC | PRN

## 2022-03-17 MED ORDER — MAGNESIUM HYDROXIDE 400 MG/5ML PO SUSP
30.0000 mL | Freq: Every day | ORAL | Status: DC
  Filled 2022-03-17 (×2): qty 30

## 2022-03-17 MED ORDER — CELECOXIB 200 MG PO CAPS: 400.0000 mg | ORAL_CAPSULE | Freq: Once | ORAL | Status: AC

## 2022-03-17 MED ORDER — CHLORHEXIDINE GLUCONATE 0.12 % MT SOLN
OROMUCOSAL | Status: AC
  Administered 2022-03-17: 15 mL via OROMUCOSAL
  Filled 2022-03-17: qty 15

## 2022-03-17 SURGICAL SUPPLY — 75 items
ATTUNE PSFEM RTSZ4 NARCEM KNEE (Femur) ×1 IMPLANT
ATTUNE PSRP INSR SZ4 8 KNEE (Insert) ×1 IMPLANT
BASE TIBIAL ROT PLAT SZ 3 KNEE (Knees) IMPLANT
BATTERY INSTRU NAVIGATION (MISCELLANEOUS) ×8 IMPLANT
BLADE CLIPPER SURG (BLADE) ×1 IMPLANT
BLADE SAW 70X12.5 (BLADE) ×2 IMPLANT
BLADE SAW 90X13X1.19 OSCILLAT (BLADE) ×2 IMPLANT
BLADE SAW 90X25X1.19 OSCILLAT (BLADE) ×2 IMPLANT
BONE CEMENT GENTAMICIN (Cement) ×4 IMPLANT
CEMENT BONE GENTAMICIN 40 (Cement) IMPLANT
CEMENT HV SMART SET (Cement) IMPLANT
COOLER POLAR GLACIER W/PUMP (MISCELLANEOUS) ×2 IMPLANT
CUFF TOURN SGL QUICK 24 (TOURNIQUET CUFF)
CUFF TOURN SGL QUICK 34 (TOURNIQUET CUFF)
CUFF TRNQT CYL 24X4X16.5-23 (TOURNIQUET CUFF) IMPLANT
CUFF TRNQT CYL 34X4.125X (TOURNIQUET CUFF) IMPLANT
DRAPE 3/4 80X56 (DRAPES) ×2 IMPLANT
DRAPE INCISE IOBAN 66X45 STRL (DRAPES) IMPLANT
DRSG DERMACEA 8X12 NADH (GAUZE/BANDAGES/DRESSINGS) ×2 IMPLANT
DRSG MEPILEX SACRM 8.7X9.8 (GAUZE/BANDAGES/DRESSINGS) ×2 IMPLANT
DRSG OPSITE POSTOP 4X14 (GAUZE/BANDAGES/DRESSINGS) ×2 IMPLANT
DRSG TEGADERM 4X4.75 (GAUZE/BANDAGES/DRESSINGS) ×2 IMPLANT
DURAPREP 26ML APPLICATOR (WOUND CARE) ×4 IMPLANT
ELECT CAUTERY BLADE 6.4 (BLADE) ×2 IMPLANT
ELECT REM PT RETURN 9FT ADLT (ELECTROSURGICAL) ×2
ELECTRODE REM PT RTRN 9FT ADLT (ELECTROSURGICAL) ×1 IMPLANT
EX-PIN ORTHOLOCK NAV 4X150 (PIN) ×4 IMPLANT
GLOVE BIOGEL M STRL SZ7.5 (GLOVE) ×8 IMPLANT
GLOVE BIOGEL PI IND STRL 8 (GLOVE) ×1 IMPLANT
GLOVE BIOGEL PI INDICATOR 8 (GLOVE) ×1
GLOVE SURG UNDER POLY LF SZ7.5 (GLOVE) ×2 IMPLANT
GOWN STRL REUS W/ TWL LRG LVL3 (GOWN DISPOSABLE) ×2 IMPLANT
GOWN STRL REUS W/ TWL XL LVL3 (GOWN DISPOSABLE) ×1 IMPLANT
GOWN STRL REUS W/TWL LRG LVL3 (GOWN DISPOSABLE) ×2
GOWN STRL REUS W/TWL XL LVL3 (GOWN DISPOSABLE) ×1
HEMOVAC 400CC 10FR (MISCELLANEOUS) ×2 IMPLANT
HOLDER FOLEY CATH W/STRAP (MISCELLANEOUS) ×2 IMPLANT
HOLSTER ELECTROSUGICAL PENCIL (MISCELLANEOUS) ×1 IMPLANT
HOOD PEEL AWAY FLYTE STAYCOOL (MISCELLANEOUS) ×4 IMPLANT
IV NS IRRIG 3000ML ARTHROMATIC (IV SOLUTION) ×2 IMPLANT
KIT TURNOVER KIT A (KITS) ×2 IMPLANT
KNIFE SCULPS 14X20 (INSTRUMENTS) ×2 IMPLANT
MANIFOLD NEPTUNE II (INSTRUMENTS) ×4 IMPLANT
NDL SPNL 20GX3.5 QUINCKE YW (NEEDLE) ×2 IMPLANT
NEEDLE SPNL 20GX3.5 QUINCKE YW (NEEDLE) ×4 IMPLANT
NS IRRIG 500ML POUR BTL (IV SOLUTION) ×2 IMPLANT
PACK TOTAL KNEE (MISCELLANEOUS) ×2 IMPLANT
PAD ABD DERMACEA PRESS 5X9 (GAUZE/BANDAGES/DRESSINGS) ×4 IMPLANT
PAD WRAPON POLAR KNEE (MISCELLANEOUS) ×1 IMPLANT
PATELLA MEDIAL ATTUN 35MM KNEE (Knees) ×1 IMPLANT
PIN DRILL FIX HALF THREAD (BIT) ×4 IMPLANT
PIN DRILL QUICK PACK ×2 IMPLANT
PIN FIXATION 1/8DIA X 3INL (PIN) ×2 IMPLANT
PULSAVAC PLUS IRRIG FAN TIP (DISPOSABLE) ×2
SOL PREP PVP 2OZ (MISCELLANEOUS) ×2
SOLUTION IRRIG SURGIPHOR (IV SOLUTION) ×2 IMPLANT
SOLUTION PREP PVP 2OZ (MISCELLANEOUS) ×1 IMPLANT
SPONGE DRAIN TRACH 4X4 STRL 2S (GAUZE/BANDAGES/DRESSINGS) ×2 IMPLANT
STAPLER SKIN PROX 35W (STAPLE) ×2 IMPLANT
STOCKINETTE IMPERV 14X48 (MISCELLANEOUS) IMPLANT
STRAP TIBIA SHORT (MISCELLANEOUS) ×2 IMPLANT
SUCTION FRAZIER HANDLE 10FR (MISCELLANEOUS) ×1
SUCTION TUBE FRAZIER 10FR DISP (MISCELLANEOUS) ×1 IMPLANT
SUT VIC AB 0 CT1 36 (SUTURE) ×4 IMPLANT
SUT VIC AB 1 CT1 36 (SUTURE) ×4 IMPLANT
SUT VIC AB 2-0 CT2 27 (SUTURE) ×2 IMPLANT
SYR 30ML LL (SYRINGE) ×4 IMPLANT
TIBIAL BASE ROT PLAT SZ 3 KNEE (Knees) ×2 IMPLANT
TIP FAN IRRIG PULSAVAC PLUS (DISPOSABLE) ×1 IMPLANT
TOWEL OR 17X26 4PK STRL BLUE (TOWEL DISPOSABLE) IMPLANT
TOWER CARTRIDGE SMART MIX (DISPOSABLE) ×2 IMPLANT
TRAY FOLEY MTR SLVR 16FR STAT (SET/KITS/TRAYS/PACK) ×2 IMPLANT
WATER STERILE IRR 1000ML POUR (IV SOLUTION) ×1 IMPLANT
WATER STERILE IRR 500ML POUR (IV SOLUTION) ×1 IMPLANT
WRAPON POLAR PAD KNEE (MISCELLANEOUS) ×2

## 2022-03-17 NOTE — H&P (Signed)
The patient has been re-examined, and the chart reviewed, and there have been no interval changes to the documented history and physical.    The risks, benefits, and alternatives have been discussed at length. The patient expressed understanding of the risks benefits and agreed with plans for surgical intervention.  Lorne Winkels P. Emika Tiano, Jr. M.D.    

## 2022-03-17 NOTE — Anesthesia Procedure Notes (Signed)
Spinal ? ?Patient location during procedure: OR ?Start time: 03/17/2022 7:35 AM ?End time: 03/17/2022 7:41 AM ?Reason for block: surgical anesthesia ?Staffing ?Performed: resident/CRNA  ?Anesthesiologist: Piscitello, Precious Haws, MD ?Resident/CRNA: Lowry Bowl, CRNA ?Preanesthetic Checklist ?Completed: patient identified, IV checked, site marked, risks and benefits discussed, surgical consent, monitors and equipment checked, pre-op evaluation and timeout performed ?Spinal Block ?Patient position: sitting ?Prep: DuraPrep ?Patient monitoring: heart rate, cardiac monitor, continuous pulse ox and blood pressure ?Approach: midline ?Location: L3-4 ?Injection technique: single-shot ?Needle ?Needle type: Sprotte  ?Needle gauge: 24 G ?Needle length: 9 cm ?Assessment ?Sensory level: T4 ?Events: CSF return ? ? ? ?

## 2022-03-17 NOTE — Evaluation (Signed)
Physical Therapy Evaluation ?Patient Details ?Name: Jody Wang ?MRN: [id number] ?DOB: [birth year] ?Today's Date: 03/17/2022 ? ?History of Present Illness ? Pt is a 76 yo female s/p R TKA. PMH of CAD, DM, heart murmur.  ?Clinical Impression ? Patient alert, agreeable to PT denied any pain throughout session. At baseline the pt reported she is independent for ADLs, uses [facility] for ambulation and that she lives with her son. She has plans for a friend and her son to assist as needed at discharge. ? ?The patient was able to perform several exercises supine, AROM for RLE. Supine to sit modI, and good sitting balance throughout session. Stand pivot to [facility] with handheld assist, and pt able to perform pericare supervision. Pt educated on RW and hand placement with occasional reminders needed, but able to sit <> Stand with RW And CGA, and ambulated ~97f. Further ambulation limited due to pt's need to urinate. Returned to recliner with all needs in reach at end of session.  Overall the patient demonstrated deficits (see "PT Problem List") that impede the patient's functional abilities, safety, and mobility and would benefit from skilled PT intervention. Recommendation is HHPT with frequent/constant supervision assistance.    ?   ? ?Recommendations for follow up therapy are one component of a multi-disciplinary discharge planning process, led by the attending physician.  Recommendations may be updated based on patient status, additional functional criteria and insurance authorization. ? ?Follow Up Recommendations Home health PT ? ?  ?Assistance Recommended at Discharge Frequent or constant Supervision/Assistance  ?Patient can return home with the following ? Assistance with cooking/housework;Assist for transportation;Help with stairs or ramp for entrance;Direct supervision/assist for medications management ? ?  ?Equipment Recommendations Rolling walker (2 wheels)  ?Recommendations for Other Services ?    ?  ?Functional  Status Assessment Patient has had a recent decline in their functional status and demonstrates the ability to make significant improvements in function in a reasonable and predictable amount of time.  ? ?  ?Precautions / Restrictions Precautions ?Precautions: Fall;Knee ?Precaution Booklet Issued: Yes (comment) ?Restrictions ?Weight Bearing Restrictions: Yes ?RLE Weight Bearing: Weight bearing as tolerated  ? ?  ? ?Mobility ? Bed Mobility ?Overal bed mobility: Modified Independent ?  ?  ?  ?  ?  ?  ?  ?  ? ?Transfers ?Overall transfer level: Needs assistance ?Equipment used: Rolling walker (2 wheels), 1 person hand held assist ?Transfers: Sit to/from Stand, Bed to chair/wheelchair/BSC ?Sit to Stand: Min guard ?Stand pivot transfers: Min guard, Min assist ?  ?  ?  ?  ?General [facility] CGA with RW ?  ? ?Ambulation/Gait ?  ?Gait Distance (Feet): 20 Feet ?Assistive device: Rolling walker (2 wheels) ?  ?  ?  ?  ?General Gait Details: pt with decreased step length, height, weight bearing on RLE ? ?Stairs ?  ?  ?  ?  ?  ? ?Wheelchair Mobility ?  ? ?Modified Rankin (Stroke Patients Only) ?  ? ?  ? ?Balance Overall balance assessment: Needs assistance ?Sitting-balance support: Feet supported ?Sitting balance-Leahy Scale: Good ?  ?  ?  ?Standing balance-Leahy Scale: Fair ?  ?  ?  ?  ?  ?  ?  ?  ?  ?  ?  ?  ?   ? ? ? ?Pertinent Vitals/Pain Pain Assessment ?Pain Assessment: No/denies pain  ? ? ?Home Living Family/patient expects to be discharged to:: Private residence ?Living Arrangements: Children ?Available Help at Discharge:  Family;Friend(s);Available 24 hours/day (either her son or a friend will be available at discharge) ?Type of Home: House ?Home Access: Level entry ?  ?  ?  ?Home Layout: One level ?Home Equipment: Kasandra Knudsen - single point;Shower seat - built in ?   ?  ?Prior Function Prior Level of Function : Independent/Modified Independent ?  ?  ?  ?  ?  ?  ?Mobility Comments: modI with  cane, does not drive ?  ?  ? ? ?Hand Dominance  ? Dominant Hand: Right ? ?  ?Extremity/Trunk Assessment  ? Upper Extremity Assessment ?Upper Extremity Assessment: Generalized weakness ?  ? ?Lower Extremity Assessment ?Lower Extremity Assessment: Generalized weakness ?  ? ?Cervical / Trunk Assessment ?Cervical / Trunk Assessment: Normal  ?Communication  ? Communication: No difficulties  ?Cognition Arousal/Alertness: Awake/alert ?Behavior During Therapy: [facility] for tasks assessed/performed ?Overall Cognitive Status: Within Functional Limits for tasks assessed ?  ?  ?  ?  ?  ?  ?  ?  ?  ?  ?  ?  ?  ?  ?  ?  ?General Comments: potentially some short term memory deficits ?  ?  ? ?  ?General Comments   ? ?  ?Exercises    ? ?Assessment/Plan  ?  ?PT Assessment Patient needs continued PT services  ?PT Problem List Decreased strength;Decreased mobility;Decreased range of motion;Decreased activity tolerance;Decreased balance;Pain;Decreased knowledge of use of DME;Decreased knowledge of precautions ? ?   ?  ?PT Treatment Interventions DME instruction;Therapeutic exercise;Gait training;Balance training;Stair training;Neuromuscular re-education;Functional mobility training;Therapeutic activities;Patient/family education   ? ?PT Goals (Current goals can be found in the Care Plan section)  ?Acute Rehab PT Goals ?Patient Stated Goal: to go home ?PT Goal Formulation: With patient ?Time For Goal Achievement: 03/31/22 ?Potential to Achieve Goals: Good ? ?  ?Frequency BID ?  ? ? ?Co-evaluation   ?  ?  ?  ?  ? ? ?  ?AM-PAC PT "6 Clicks" Mobility  ?Outcome Measure Help needed turning from your back to your side while in a flat bed without using bedrails?: None ?Help needed moving from lying on your back to sitting on the side of a flat bed without using bedrails?: None ?Help needed moving to and from a bed to a chair (including a wheelchair)?: None ?Help needed standing up from a chair using your arms (e.g., wheelchair or bedside chair)?:  None ?Help needed to walk in hospital room?: A Little ?Help needed climbing 3-5 steps with a railing? : A Little ?6 Click Score: 22 ? ?  ?End of Session Equipment Utilized During Treatment: Gait belt ?Activity Tolerance: Patient tolerated treatment well ?Patient left: with chair alarm set;in chair;with call bell/phone within reach ?Nurse Communication: Mobility status ?PT Visit Diagnosis: Other abnormalities of gait and mobility (R26.89);Difficulty in walking, not elsewhere classified (R26.2);Pain;Muscle weakness (generalized) (M62.81) ?Pain - Right/Left: Right ?Pain - part of body: Knee ?  ? ?Time: 1937-9024 ?PT Time Calculation (min) (ACUTE ONLY): 34 min ? ? ?Charges:   PT Evaluation ?$PT Eval Low Complexity: 1 Low ?PT Treatments ?$Therapeutic Activity: 23-37 mins ?  ?   ? ?Lieutenant Diego PT, DPT ?3:24 PM,03/17/22 ? ?

## 2022-03-17 NOTE — Transfer of Care (Signed)
Immediate Anesthesia Transfer of Care Note ? ?Patient: IASHA MCCALISTER ? ?Procedure(s) Performed: COMPUTER ASSISTED TOTAL KNEE ARTHROPLASTY (Right: Knee) ? ?Patient Location: PACU ? ?Anesthesia Type:Spinal ? ?Level of Consciousness: awake, drowsy and patient cooperative ? ?Airway & Oxygen Therapy: Patient Spontanous Breathing and Patient connected to face mask oxygen ? ?Post-op Assessment: Report given to RN and Post -op Vital signs reviewed and stable ? ?Post vital signs: Reviewed and stable ? ?Last Vitals:  ?Vitals Value Taken Time  ?BP 97/63 03/17/22 1116  ?Temp    ?Pulse 111 03/17/22 1118  ?Resp 17 03/17/22 1118  ?SpO2 91 % 03/17/22 1118  ?Vitals shown include unvalidated device data. ? ?Last Pain:  ?Vitals:  ? 03/17/22 0639  ?TempSrc: Temporal  ?PainSc: 8   ?   ? ?Patients Stated Pain Goal: 0 (03/17/22 0737) ? ?Complications: No notable events documented. ?

## 2022-03-17 NOTE — H&P (Signed)
ORTHOPAEDIC HISTORY & PHYSICAL ?Tamala Julian Jud, [place] - 03/07/2022 3:45 PM EDT ?Formatting of this note is different from the original. ?[place] [facility] ?Chief Complaint:  ? ?Chief Complaint  ?Patient presents with  ? Knee Pain  ?H & P RIGHT KNEE  ? ?History of Present Illness:  ? ?Jody Wang is a 76 y.o. female that presents to clinic today for her preoperative history and evaluation. Patient presents unaccompanied. The patient is scheduled to undergo a right total knee arthroplasty on 03/17/22 by Dr. Marry Guan. Her pain began several years ago. The pain is located primarily along the medial aspect of the knee. She describes her pain as worse with weightbearing. She reports associated swelling with some giving way of the knee. She denies associated numbness or tingling, denies locking.  ? ?The patient's symptoms have progressed to the point that they decrease her quality of life. The patient has previously undergone conservative treatment including NSAIDS and injections to the knee without adequate control of her symptoms. ? ?Denies history of lumbar surgery, denies history of DVT. Not followed by cardiology. ? ?Patient is a type II diabetic. Last A1c 7.3 on 03/06/2022. ? ?Past Medical, Surgical, Family, Social History, Allergies, Medications:  ? ?Past Medical History:  ?Past Medical History:  ?Diagnosis Date  ? Depression  ? Diabetes mellitus without complication (CMS-HCC)  ? Gastroesophageal reflux disease  ? Hypercholesterolemia  ? Hypothyroidism  ? Murmur, heart, unspecified  ?slight- followed by pcp  ? Wears dentures  ?partial- lower  ? ?Past Surgical History:  ?Past Surgical History:  ?Procedure Laterality Date  ? ABDOMINAL HYSTERECTOMY 1985  ? Cataract extraction with phaco Bilateral 02/16/2016  ?Right 02/16/16, Left 03/15/16  ? Breast cyst excision Bilateral  ? Inner ear surgery  ? ?Current Medications:  ?Current Outpatient Medications  ?Medication Sig Dispense  Refill  ? acetaminophen (TYLENOL) 500 MG tablet Take 1,000 mg by mouth every 3 (three) hours  ? aspirin 81 MG EC tablet Take 81 mg by mouth once daily  ? cholecalciferol 1000 unit tablet Take 1,000 Units by mouth once daily  ? cyanocobalamin (VITAMIN B12) 1000 MCG tablet Take 1,000 mcg by mouth once daily  ? diclofenac (VOLTAREN) 1 % topical gel Apply 2 g topically 2 (two) times daily  ? dulaglutide (TRULICITY) 3 EX/9.3 mL subcutaneous pen injector Inject subcutaneously once a week  ? FLUoxetine (PROZAC) 20 MG capsule TAKE 1 CAPSULE EVERY DAY  ? fluticasone propionate (FLONASE) 50 mcg/actuation nasal spray Place 2 sprays into both nostrils once daily as needed  ? lancing device Misc 1 each as needed  ? levocetirizine (XYZAL) 5 MG tablet Take 5 mg by mouth every evening.  ? ? levothyroxine (SYNTHROID, LEVOTHROID) 88 MCG tablet Take 88 mcg by mouth once daily.  ? ? metFORMIN (GLUCOPHAGE-XR) 500 MG XR tablet Take 1,000 mg by mouth once daily  ? ONETOUCH VERIO HIGH CONTROL Soln 1 each once daily  ? ONETOUCH VERIO TEST STRIPS test strip 1 strip 3 (three) times daily  ? pantoprazole (PROTONIX) 40 MG DR tablet TAKE 1 TABLET EVERY DAY  ? pravastatin (PRAVACHOL) 40 MG tablet Take 20 mg by mouth once daily.  ? ? rosuvastatin (CRESTOR) 40 MG tablet Take 40 mg by mouth once daily  ? traZODone (DESYREL) 50 MG tablet Take 50 mg by mouth at bedtime  ? ULTRA THIN LANCETS Use 1 each 3 (three) times daily  ? UNABLE TO FIND Apply 1 Application topically once daily Med Name: The  Healing Rose CBD topical solution  ? ?No current facility-administered medications for this visit.  ? ?Allergies:  ?Allergies  ?Allergen Reactions  ? Codeine Shortness Of Breath  ?stops breathing  ? Meperidine Shortness Of Breath  ?Stops breathing ? ? Adhesive Rash  ?Paper tape ok  ? Empagliflozin Other (See Comments)  ?Yeast infections  ? Ezetimibe Muscle Pain  ? ?Social History:  ?Social History  ? ?Socioeconomic History  ? Marital status: Widowed  ? Number  of children: 3  ? Years of education: 35  ?Occupational History  ? 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 Smoking status: Former  ?Packs/day: 1.00  ?Years: 25.00  ?Pack years: 25.00  ?Types: Cigarettes  ?Quit date: 11/2013  ?Years since quitting: 8.3  ? Smokeless tobacco: Never  ?Vaping Use  ? Vaping Use: Never used  ?Substance and Sexual Activity  ? Alcohol use: Yes  ?Alcohol/week: 2.0 standard drinks  ?Types: 2 Standard drinks or equivalent per week  ?Comment: 2 Margaritas a week  ? Drug use: No  ? Sexual activity: Defer  ?Partners: Male  ? ?Family History:  ?Family History  ?Problem Relation Age of Onset  ? Arthritis Mother  ? Diabetes Father  ? Coronary Artery Disease (Blocked arteries around heart) Father  ? High blood pressure (Hypertension) Father  ? Hyperlipidemia (Elevated cholesterol) Father  ? High blood pressure (Hypertension) Sister  ? Glaucoma Sister  ? Diabetes Sister  ? High blood pressure (Hypertension) Brother  ? ?Review of Systems:  ? ?A 10+ ROS was performed, reviewed, and the pertinent orthopaedic findings are documented in the HPI.  ? ?Physical Examination:  ? ?BP 124/80 (BP Location: Left upper arm, Patient Position: Sitting, BP Cuff Size: Adult)  Ht 157.5 cm ('5\' 2"'$ )  Wt 61.2 kg (135 lb)  BMI 24.69 kg/m?  ? ?Patient is a well-developed, well-nourished female in no acute distress. Patient has normal mood and affect. Patient is alert and oriented to person, place, and time.  ? ?HEENT: Atraumatic, normocephalic. Pupils equal and reactive to light. Extraocular motion intact. Noninjected sclera. ? ?Cardiovascular: Regular rate and rhythm, with no murmurs, rubs, or gallops. Distal pulses palpable. Faint carotid bruit on the left.  ? ?Respiratory: Lungs clear to auscultation bilaterally.  ? ?Right Knee: ?Soft tissue swelling: mild ?Effusion: none ?Erythema: none ?Crepitance: mild ?Tenderness: medial ?Alignment: relative varus ?Mediolateral laxity: medial pseudolaxity ?Posterior sag:  negative ?Patellar tracking: Good tracking without evidence of subluxation or tilt ?Atrophy: No significant atrophy.  ?Quadriceps tone was fair to good. ?Range of motion: 0/15/108 degrees ? ?Able to plantarflex and dorsiflex the right ankle. Able to flex and extend the toes. ? ?Sensation intact over the saphenous, lateral sural cutaneous, superficial fibular, and deep fibular nerve distributions. ? ?Tests Performed/Reviewed:  ?X-rays ? ?3 views of the right knee were reviewed. Images reveal severe loss of medial compartment joint space with bone-on-bone contact and significant osteophyte formation. No fractures or dislocations. No osseous abnormality noted. ? ?Impression:  ? ?ICD-10-CM  ?1. Primary osteoarthritis of right knee M17.11  ? ?Plan:  ? ?The patient has end-stage degenerative changes of the right knee. It was explained to the patient that the condition is progressive in nature. Having failed conservative treatment, the patient has elected to proceed with a total joint arthroplasty. The patient will undergo a total joint arthroplasty with Dr. Marry Guan. The risks of surgery, including blood clot and infection, were discussed with the patient. Measures to reduce these risks, including the use of anticoagulation, perioperative antibiotics,  and early ambulation were discussed. The importance of postoperative physical therapy was discussed with the patient. The patient elects to proceed with surgery. The patient is instructed to stop all blood thinners prior to surgery. The patient is instructed to call the hospital the day before surgery to learn of the proper arrival time.  ? ?Contact our office with any questions or concerns. Follow up as indicated, or sooner should any new problems arise, if conditions worsen, or if they are otherwise concerned.  ? ?Gwenlyn Fudge, PA -C ?[facility] ?[address] ?[place], [place] [address] ?Phone: [phone] ? ?This note was  generated in part with voice recognition software and I apologize for any typographical errors that were not detected and corrected. ? ?Electronically signed by Gwenlyn Fudge, PA at 03/10/2022 5:56 PM ED

## 2022-03-17 NOTE — Anesthesia Preprocedure Evaluation (Signed)
Anesthesia Evaluation  ?Patient identified by MRN, date of birth, ID band ?Patient awake ? ? ? ?Reviewed: ?Allergy & Precautions, NPO status , Patient's Chart, lab work & pertinent test results ? ?History of Anesthesia Complications ?(+) PROLONGED EMERGENCE and history of anesthetic complications ? ?Airway ?Mallampati: III ? ?TM Distance: >3 FB ?Neck ROM: full ? ? ? Dental ? ?(+) Chipped, Poor Dentition, Missing ?  ?Pulmonary ?neg shortness of breath, former smoker,  ?  ?Pulmonary exam normal ? ? ? ? ? ? ? Cardiovascular ?Exercise Tolerance: Good ?(-) angina+ CAD  ?(-) Past MI Normal cardiovascular exam ? ? ?  ?Neuro/Psych ?PSYCHIATRIC DISORDERS negative neurological ROS ?   ? GI/Hepatic ?Neg liver ROS, GERD  Controlled,  ?Endo/Other  ?diabetesHypothyroidism  ? Renal/GU ?  ? ?  ?Musculoskeletal ? ? Abdominal ?  ?Peds ? Hematology ?  ?Anesthesia Other Findings ?Past Medical History: ?No date: AK (actinic keratosis) ?No date: Anemia ?No date: Aortic atherosclerosis ([place]) ?No date: Carotid artery disease ([place]) ?No date: Carotid bruit ?No date: Complication of anesthesia ?    Comment:  hard time waking up x 1 surgery ?No date: Depression ?No date: Diabetes mellitus without complication ([place]) ?No date: GERD (gastroesophageal reflux disease) ?No date: Heart murmur ?No date: Hypercholesterolemia ?No date: Hypothyroidism ?No date: Lung nodule, multiple ?No date: Osteoarthritis of knee ?No date: Shingles ?No date: Wears dentures ?    Comment:  partial lower ? ?Past Surgical History: ?1985: ABDOMINAL HYSTERECTOMY ?    Comment:  ovaries not removed ?No date: BREAST CYST EXCISION; Right ?No date: BREAST CYST EXCISION; Left ?02/16/2016: CATARACT EXTRACTION W/PHACO; Right ?    Comment:  Procedure: CATARACT EXTRACTION PHACO AND INTRAOCULAR  ?             LENS PLACEMENT ([place]) RIGHT ;  Surgeon: Nila Nephew  ?             Brasington, MD;  Location: [facility];  Service: ?              Ophthalmology;  Laterality: Right;  DIABETIC - oral meds ?03/15/2016: CATARACT EXTRACTION W/PHACO; Left ?    Comment:  Procedure: CATARACT EXTRACTION PHACO AND INTRAOCULAR  ?             LENS PLACEMENT ([facility])left eye;  Surgeon: Nila Nephew  ?             Brasington, MD;  Location: [facility];  Service: ?             Ophthalmology;  Laterality: Left;  DIABETIC - oral meds ?No date: FOOT SURGERY; Bilateral ?No date: INNER EAR SURGERY; Left ? ?BMI   ? Body Mass Index: 25.45 kg/m?  ?  ? ? Reproductive/Obstetrics ?negative OB ROS ? ?  ? ? ? ? ? ? ? ? ? ? ? ? ? ?  ?  ? ? ? ? ? ? ? ? ?Anesthesia Physical ?Anesthesia Plan ? ?ASA: 3 ? ?Anesthesia Plan: Spinal  ? ?Post-op Pain Management:   ? ?Induction:  ? ?PONV Risk Score and Plan:  ? ?Airway Management Planned: Natural Airway and Nasal Cannula ? ?Additional Equipment:  ? ?Intra-op Plan:  ? ?Post-operative Plan:  ? ?Informed Consent: I have reviewed the patients History and Physical, chart, labs and discussed the procedure including the risks, benefits and alternatives for the proposed anesthesia with the patient or authorized representative who has indicated his/her understanding and acceptance.  ? ? ? ?Dental Advisory Given ? ?Plan Discussed with: Anesthesiologist,  CRNA and Surgeon ? ?Anesthesia Plan Comments: (Patient reports no bleeding problems and no anticoagulant use. ? ?Plan for spinal with backup GA ? ?Patient consented for risks of anesthesia including but not limited to:  ?- adverse reactions to medications ?- damage to eyes, teeth, lips or other oral mucosa ?- nerve damage due to positioning  ?- risk of bleeding, infection and or nerve damage from spinal that could lead to paralysis ?- risk of headache or failed spinal ?- damage to teeth, lips or other oral mucosa ?- sore throat or hoarseness ?- damage to heart, brain, nerves, lungs, other parts of body or loss of life ? ?Patient voiced understanding.)  ? ? ? ? ? ? ?Anesthesia Quick Evaluation ? ?

## 2022-03-17 NOTE — Plan of Care (Signed)
?  Problem: Education: ?Goal: Knowledge of General Education information will improve ?Description: Including pain rating scale, medication(s)/side effects and non-pharmacologic comfort measures ?Outcome: Progressing ?  ?Problem: Health Behavior/Discharge Planning: ?Goal: Ability to manage health-related needs will improve ?Outcome: Progressing ?  ?Problem: Clinical Measurements: ?Goal: Ability to maintain clinical measurements within normal limits will improve ?Outcome: Progressing ?Goal: Will remain free from infection ?Outcome: Progressing ?Goal: Respiratory complications will improve ?Outcome: Not Applicable ?Goal: Cardiovascular complication will be avoided ?Outcome: Progressing ?  ?Problem: Clinical Measurements: ?Goal: Will remain free from infection ?Outcome: Progressing ?  ?Problem: Clinical Measurements: ?Goal: Respiratory complications will improve ?Outcome: Not Applicable ?  ?Problem: Clinical Measurements: ?Goal: Cardiovascular complication will be avoided ?Outcome: Progressing ?  ?Problem: Activity: ?Goal: Risk for activity intolerance will decrease ?Outcome: Progressing ?  ?Problem: Activity: ?Goal: Risk for activity intolerance will decrease ?Outcome: Progressing ?  ?

## 2022-03-17 NOTE — Op Note (Signed)
OPERATIVE NOTE ? ?DATE OF SURGERY:  03/17/2022 ? ?PATIENT NAME:  Jody Wang   ?DOB: [birth year]  ?MRN: [id number] ? ?PRE-OPERATIVE DIAGNOSIS: Degenerative arthrosis of the right knee, primary ? ?POST-OPERATIVE DIAGNOSIS:  Same ? ?PROCEDURE:  Right total knee arthroplasty using computer-assisted navigation ? ?SURGEON:  Marciano Sequin. M.D. ? ?ANESTHESIA: spinal ? ?ESTIMATED BLOOD LOSS: 50 mL ? ?FLUIDS REPLACED: 1600 mL of crystalloid ? ?TOURNIQUET TIME: 102 minutes ? ?DRAINS: 2 medium Hemovac drains ? ?SOFT TISSUE RELEASES: Anterior cruciate ligament, posterior cruciate ligament, deep medial collateral ligament, patellofemoral ligament ? ?IMPLANTS UTILIZED: DePuy Attune size 4N posterior stabilized femoral component (cemented), size 3 rotating platform tibial component (cemented), 35 mm medialized dome patella (cemented), and an 8 mm stabilized rotating platform polyethylene insert. ? ?INDICATIONS FOR SURGERY: Jody Wang is a 76 y.o. year old adult with a long history of progressive knee pain. X-rays demonstrated severe degenerative changes in tricompartmental fashion. The patient had not seen any significant improvement despite conservative nonsurgical intervention. After discussion of the risks and benefits of surgical intervention, the patient expressed understanding of the risks benefits and agree with plans for total knee arthroplasty.  ? ?The risks, benefits, and alternatives were discussed at length including but not limited to the risks of infection, bleeding, nerve injury, stiffness, blood clots, the need for revision surgery, cardiopulmonary complications, among others, and they were willing to proceed. ? ?PROCEDURE IN DETAIL: The patient was brought into the operating room and, after adequate spinal anesthesia was achieved, a tourniquet was placed on the patient's upper thigh. The patient's knee and leg were cleaned and prepped with alcohol and DuraPrep and draped in the usual sterile  fashion. A "timeout" was performed as per usual protocol. The lower extremity was exsanguinated using an Esmarch, and the tourniquet was inflated to 300 mmHg. An anterior longitudinal incision was made followed by a standard mid vastus approach. The deep fibers of the medial collateral ligament were elevated in a subperiosteal fashion off of the medial flare of the tibia so as to maintain a continuous soft tissue sleeve. The patella was subluxed laterally and the patellofemoral ligament was incised. Inspection of the knee demonstrated severe degenerative changes with full-thickness loss of articular cartilage. Osteophytes were debrided using a rongeur. Anterior and posterior cruciate ligaments were excised. Two 4.0 mm Schanz pins were inserted in the femur and into the tibia for attachment of the array of trackers used for computer-assisted navigation. Hip center was identified using a circumduction technique. Distal landmarks were mapped using the computer. The distal femur and proximal tibia were mapped using the computer. The distal femoral cutting guide was positioned using computer-assisted navigation so as to achieve a 5? distal valgus cut. The femur was sized and it was felt that a size 4N femoral component was appropriate. A size 4 femoral cutting guide was positioned and the anterior cut was performed and verified using the computer. This was followed by completion of the posterior and chamfer cuts. Femoral cutting guide for the central box was then positioned in the center box cut was performed. ? ?Attention was then directed to the proximal tibia. Medial and lateral menisci were excised. The extramedullary tibial cutting guide was positioned using computer-assisted navigation so as to achieve a 0? varus-valgus alignment and 3? posterior slope. The cut was performed and verified using the computer. The proximal tibia was sized and it was felt that a size 3 tibial tray was appropriate. Tibial and femoral  trials were inserted followed  by insertion of an 8 mm polyethylene insert. This allowed for excellent mediolateral soft tissue balancing both in flexion and in full extension. Finally, the patella was cut and prepared so as to accommodate a 35 mm medialized dome patella. A patella trial was placed and the knee was placed through a range of motion with excellent patellar tracking appreciated. The femoral trial was removed after debridement of posterior osteophytes. The central post-hole for the tibial component was reamed followed by insertion of a keel punch. Tibial trials were then removed. Cut surfaces of bone were irrigated with copious amounts of normal saline using pulsatile lavage and then suctioned dry. Polymethylmethacrylate cement with gentamicin was prepared in the usual fashion using a vacuum mixer. Cement was applied to the cut surface of the proximal tibia as well as along the undersurface of a size 3 rotating platform tibial component. Tibial component was positioned and impacted into place. Excess cement was removed using [employer] fast streamer. Cement was then applied to the cut surfaces of the femur as well as along the posterior flanges of the size 4N femoral component. The femoral component was positioned and impacted into place. Excess cement was removed using [employer] fast streamer. An 8 mm polyethylene trial was inserted and the knee was brought into full extension with steady axial compression applied. Finally, cement was applied to the backside of a 35 mm medialized dome patella and the patellar component was positioned and patellar clamp applied. Excess cement was removed using [employer] fast streamer. After adequate curing of the cement, the tourniquet was deflated after a total tourniquet time of 102 minutes. Hemostasis was achieved using electrocautery. The knee was irrigated with copious amounts of normal saline using pulsatile lavage followed by 450 ml of Surgiphor and then suctioned dry. 20 mL of 1.3%  Exparel and 60 mL of 0.25% Marcaine in 40 mL of normal saline was injected along the posterior capsule, medial and lateral gutters, and along the arthrotomy site. An 8 mm stabilized rotating platform polyethylene insert was inserted and the knee was placed through a range of motion with excellent mediolateral soft tissue balancing appreciated and excellent patellar tracking noted. 2 medium drains were placed in the wound bed and brought out through separate stab incisions. The medial parapatellar portion of the incision was reapproximated using interrupted sutures of #1 Vicryl. Subcutaneous tissue was approximated in layers using first #0 Vicryl followed #2-0 Vicryl. The skin was approximated with skin staples. A sterile dressing was applied. ? ?The patient tolerated the procedure well and was transported to the recovery room in stable condition.   ? ?Jody Wang., M.D. ? ?

## 2022-03-18 DIAGNOSIS — E039 Hypothyroidism, unspecified: Secondary | ICD-10-CM | POA: Diagnosis not present

## 2022-03-18 DIAGNOSIS — M1711 Unilateral primary osteoarthritis, right knee: Secondary | ICD-10-CM | POA: Diagnosis not present

## 2022-03-18 DIAGNOSIS — R531 Weakness: Secondary | ICD-10-CM | POA: Diagnosis not present

## 2022-03-18 DIAGNOSIS — Z7984 Long term (current) use of oral hypoglycemic drugs: Secondary | ICD-10-CM | POA: Diagnosis not present

## 2022-03-18 DIAGNOSIS — I2510 Atherosclerotic heart disease of native coronary artery without angina pectoris: Secondary | ICD-10-CM | POA: Diagnosis not present

## 2022-03-18 DIAGNOSIS — Z79899 Other long term (current) drug therapy: Secondary | ICD-10-CM | POA: Diagnosis not present

## 2022-03-18 DIAGNOSIS — Z794 Long term (current) use of insulin: Secondary | ICD-10-CM | POA: Diagnosis not present

## 2022-03-18 DIAGNOSIS — Z7982 Long term (current) use of aspirin: Secondary | ICD-10-CM | POA: Diagnosis not present

## 2022-03-18 DIAGNOSIS — E119 Type 2 diabetes mellitus without complications: Secondary | ICD-10-CM | POA: Diagnosis not present

## 2022-03-18 LAB — GLUCOSE, CAPILLARY
Glucose-Capillary: 237 mg/dL — ABNORMAL HIGH (ref 70–99)
Glucose-Capillary: 172 mg/dL — ABNORMAL HIGH (ref 70–99)

## 2022-03-18 MED ORDER — ACETAMINOPHEN 500 MG PO TABS: 500.0000 mg | ORAL_TABLET | Freq: Four times a day (QID) | ORAL | 0 refills | Status: DC | PRN

## 2022-03-18 MED ORDER — ONDANSETRON HCL 4 MG PO TABS: 4.0000 mg | ORAL_TABLET | Freq: Four times a day (QID) | ORAL | 0 refills | Status: DC | PRN

## 2022-03-18 MED ORDER — TRAMADOL HCL 50 MG PO TABS: 50.0000 mg | ORAL_TABLET | ORAL | 0 refills | Status: DC | PRN

## 2022-03-18 MED ORDER — ENOXAPARIN SODIUM 40 MG/0.4ML IJ SOSY: 40.0000 mg | PREFILLED_SYRINGE | INTRAMUSCULAR | 0 refills | Status: DC

## 2022-03-18 MED ORDER — CELECOXIB 200 MG PO CAPS: 200.0000 mg | ORAL_CAPSULE | Freq: Two times a day (BID) | ORAL | 0 refills | Status: DC

## 2022-03-18 NOTE — Plan of Care (Signed)
?  Problem: Education: ?Goal: Knowledge of General Education information will improve ?Description: Including pain rating scale, medication(s)/side effects and non-pharmacologic comfort measures ?Outcome: Progressing ?  ?Problem: Health Behavior/Discharge Planning: ?Goal: Ability to manage health-related needs will improve ?Outcome: Progressing ?  ?Problem: Clinical Measurements: ?Goal: Ability to maintain clinical measurements within normal limits will improve ?Outcome: Progressing ?Goal: Will remain free from infection ?Outcome: Progressing ?Goal: Diagnostic test results will improve ?Outcome: Progressing ?Goal: Cardiovascular complication will be avoided ?Outcome: Progressing ?  ?Problem: Activity: ?Goal: Risk for activity intolerance will decrease ?Outcome: Progressing ?  ?Problem: Nutrition: ?Goal: Adequate nutrition will be maintained ?Outcome: Progressing ?  ?Problem: Coping: ?Goal: Level of anxiety will decrease ?Outcome: Progressing ?  ?Problem: Elimination: ?Goal: Will not experience complications related to bowel motility ?Outcome: Progressing ?Goal: Will not experience complications related to urinary retention ?Outcome: Progressing ?  ?Problem: Pain Managment: ?Goal: General experience of comfort will improve ?Outcome: Progressing ?  ?Problem: Safety: ?Goal: Ability to remain free from injury will improve ?Outcome: Progressing ?  ?Problem: Skin Integrity: ?Goal: Risk for impaired skin integrity will decrease ?Outcome: Progressing ?  ?Problem: Education: ?Goal: Knowledge of the prescribed therapeutic regimen will improve ?Outcome: Progressing ?Goal: Individualized Educational Video(s) ?Outcome: Progressing ?  ?Problem: Activity: ?Goal: Ability to avoid complications of mobility impairment will improve ?Outcome: Progressing ?Goal: Range of joint motion will improve ?Outcome: Progressing ?  ?Problem: Clinical Measurements: ?Goal: Postoperative complications will be avoided or minimized ?Outcome:  Progressing ?  ?Problem: Pain Management: ?Goal: Pain level will decrease with appropriate interventions ?Outcome: Progressing ?  ?Problem: Skin Integrity: ?Goal: Will show signs of wound healing ?Outcome: Progressing ?  ?

## 2022-03-18 NOTE — Anesthesia Postprocedure Evaluation (Signed)
Anesthesia Post Note ? ?Patient: Jody Wang ? ?Procedure(s) Performed: COMPUTER ASSISTED TOTAL KNEE ARTHROPLASTY (Right: Knee) ? ?Patient location during evaluation: Nursing Unit ?Anesthesia Type: Spinal ?Level of consciousness: awake and alert ?Pain management: pain level controlled ?Vital Signs Assessment: post-procedure vital signs reviewed and stable ?Respiratory status: spontaneous breathing, nonlabored ventilation and respiratory function stable ?Cardiovascular status: blood pressure returned to baseline and stable ?Postop Assessment: no apparent nausea or vomiting, no headache, no backache, patient able to bend at knees and able to ambulate ?Anesthetic complications: no ? ? ?No notable events documented. ? ? ?Last Vitals:  ?Vitals:  ? 03/18/22 0329 03/18/22 0924  ?BP: 120/82   ?Pulse: 96   ?Resp: 13   ?Temp: 36.7 ?C   ?SpO2: 98% 98%  ?  ?Last Pain:  ?Vitals:  ? 03/18/22 0927  ?TempSrc:   ?PainSc: 4   ? ? ?  ?  ?  ?  ?  ?  ? ?[place] Ouch ? ? ? ? ?

## 2022-03-18 NOTE — Discharge Summary (Signed)
?Physician Discharge Summary  ?Patient ID: ?Jody Wang ?MRN: [id number] ?DOB/AGE: 1946-07-12 76 y.o. ? ?Admit date: 03/17/2022 ?Discharge date: 03/18/2022 ? ?Admission Diagnoses:  ?Total knee replacement status [Z96.659] ?Primary degenerative arthrosis of the right knee ? ?Discharge Diagnoses: ?Patient Active Problem List  ? Diagnosis Date Noted  ? Total knee replacement status 03/17/2022  ? Localized, primary osteoarthritis of shoulder region 02/19/2022  ? Primary osteoarthritis of left knee 02/19/2022  ? Non-recurrent acute serous otitis media of right ear 01/11/2022  ? Joint pain 11/13/2021  ? Memory change 10/11/2020  ? Carotid bruit 06/06/2020  ? Anemia 02/22/2020  ? Acute cough 01/19/2020  ? Lung nodule, multiple 03/14/2018  ? AK (actinic keratosis) 03/14/2018  ? Aortic atherosclerosis ([place]) 05/14/2017  ? Coronary artery disease of native artery of native heart with stable angina pectoris ([place]) 05/14/2017  ? Abnormal CT of the chest 02/11/2017  ? SOB (shortness of breath) on exertion 02/01/2017  ? Chest pain 01/28/2017  ? Primary osteoarthritis of right knee 08/29/2016  ? Shingles 07/02/2016  ? Knee pain 07/11/2015  ? Health care maintenance 02/27/2015  ? Carotid artery disease ([place]) 02/27/2015  ? Carotid stenosis 02/27/2015  ? Cerumen impaction 06/21/2014  ? Weight loss 04/12/2014  ? Environmental allergies 04/12/2014  ? Right shoulder pain 10/08/2013  ? GERD (gastroesophageal reflux disease) 10/09/2012  ? Mild depression 10/08/2012  ? Hypercholesterolemia 10/08/2012  ? Hypothyroidism 10/08/2012  ? Type 2 diabetes mellitus with hyperglycemia ([place]) 10/08/2012  ? ? ?Past Medical History:  ?Diagnosis Date  ? AK (actinic keratosis)   ? Anemia   ? Aortic atherosclerosis ([place])   ? Carotid artery disease ([place])   ? Carotid bruit   ? Complication of anesthesia   ? hard time waking up x 1 surgery  ? Depression   ? Diabetes mellitus without complication ([place])   ? GERD (gastroesophageal reflux disease)   ? Heart  murmur   ? Hypercholesterolemia   ? Hypothyroidism   ? Lung nodule, multiple   ? Osteoarthritis of knee   ? Shingles   ? Wears dentures   ? partial lower  ? ?  ?Transfusion: None. ?  ?Consultants (if any):  ? ?Discharged Condition: Improved ? ?Hospital Course: Jody Wang is an 76 y.o. adult who was admitted 03/17/2022 with a diagnosis of primary degenerative arthrosis of the right knee and went to the operating room on 03/17/2022 and underwent the above named procedures.  ?  ?Surgeries: Procedure(s): ?COMPUTER ASSISTED TOTAL KNEE ARTHROPLASTY on 03/17/2022 ?Patient tolerated the surgery well. Taken to PACU where she was stabilized and then transferred to the orthopedic floor. ? ?Started on Lovenox 41m q 12 hrs. SCDs applied. Heels elevated on bed with rolled towels. No evidence of DVT. Negative Homan. ?Physical therapy started on day #1 for gait training and transfer. OT started day #1 for ADL and assisted devices. ? ?Patient's IV and hemovac were both removed on POD1. ? ?Implants: DePuy Attune size 4N posterior stabilized femoral component (cemented), size 3 rotating platform tibial component (cemented), 35 mm medialized dome patella (cemented), and an 8 mm stabilized rotating platform polyethylene insert. ? ?He was given perioperative antibiotics:  ?Anti-infectives (From admission, onward)  ? ? Start     Dose/Rate Route Frequency Ordered Stop  ? 03/17/22 1400  ceFAZolin (ANCEF) IVPB 2g/100 mL premix       ? 2 g ?200 mL/hr over 30 Minutes Intravenous Every 6 hours 03/17/22 1256 03/17/22 2123  ? 03/17/22 0624  ceFAZolin (ANCEF) 2-4  GM/100ML-% IVPB       ?Note to Pharmacy: Doreen Salvage J: cabinet override  ?    03/17/22 7989 03/17/22 2123  ? 03/17/22 0615  ceFAZolin (ANCEF) IVPB 2g/100 mL premix       ? 2 g ?200 mL/hr over 30 Minutes Intravenous On call to O.R. 03/17/22 2119 03/17/22 0825  ? ?  ?. ? ?He was given sequential compression devices, early ambulation, and Lovenox for DVT prophylaxis. ? ?He  benefited maximally from the hospital stay and there were no complications.   ? ?Recent vital signs:  ?Vitals:  ? 03/17/22 2038 03/18/22 0329  ?BP: 135/67 120/82  ?Pulse: 85 96  ?Resp: 19 13  ?Temp: 98 ?F (36.7 ?C) 98.1 ?F (36.7 ?C)  ?SpO2: 100% 98%  ? ? ?Recent laboratory studies:  ?Lab Results  ?Component Value Date  ? HGB 13.1 03/06/2022  ? HGB 12.3 01/04/2022  ? HGB 12.7 11/09/2021  ? ?Lab Results  ?Component Value Date  ? WBC 8.0 03/06/2022  ? PLT 359 03/06/2022  ? ?No results found for: INR ?Lab Results  ?Component Value Date  ? NA 136 03/06/2022  ? K 4.5 03/06/2022  ? CL 103 03/06/2022  ? CO2 23 03/06/2022  ? BUN 9 03/06/2022  ? CREATININE 0.70 03/06/2022  ? GLUCOSE 125 (H) 03/06/2022  ? ? ?Discharge Medications:   ?Allergies as of 03/18/2022   ? ?   Reactions  ? Codeine Shortness Of Breath  ? stops breathing  ? Meperidine And Related Shortness Of Breath  ? Stops breathing  ? Oxycontin [oxycodone Hcl] Shortness Of Breath  ? Stops breathing  ? Ezetimibe Other (See Comments)  ? Doesn't remember reaction ?Other reaction(s): Muscle Pain  ? Jardiance [empagliflozin] Other (See Comments)  ? Yeast infections  ? Tape Rash  ? Paper tape ok  ? ?  ? ?  ?Medication List  ?  ? ?STOP taking these medications   ? ?aspirin 81 MG chewable tablet ?  ?diclofenac 50 MG EC tablet ?Commonly known as: VOLTAREN ?  ? ?  ? ?TAKE these medications   ? ?acetaminophen 500 MG tablet ?Commonly known as: TYLENOL ?Take 1-2 tablets (500-1,000 mg total) by mouth every 6 (six) hours as needed for mild pain. ?What changed:  ?how much to take ?when to take this ?reasons to take this ?  ?benzonatate 100 MG capsule ?Commonly known as: TESSALON ?Take 1 capsule (100 mg total) by mouth 2 (two) times daily as needed for cough. ?  ?blood glucose meter kit and supplies Kit ?Dispense based on patient and insurance preference. Use to check blood sugars twice daily. DX E11.9 ?  ?celecoxib 200 MG capsule ?Commonly known as: CELEBREX ?Take 1 capsule (200 mg  total) by mouth 2 (two) times daily. ?What changed:  ?medication strength ?how much to take ?  ?cholecalciferol 1000 units tablet ?Commonly known as: VITAMIN D ?Take 1,000 Units by mouth daily. ?  ?enoxaparin 40 MG/0.4ML injection ?Commonly known as: LOVENOX ?Inject 0.4 mLs (40 mg total) into the skin daily. ?  ?FLUoxetine 20 MG capsule ?Commonly known as: PROZAC ?Take 1 capsule (20 mg total) by mouth daily. ?What changed: when to take this ?  ?fluticasone 50 MCG/ACT nasal spray ?Commonly known as: FLONASE ?USE 2 SPRAYS IN BOTH  NOSTRILS DAILY ?  ?levocetirizine 5 MG tablet ?Commonly known as: XYZAL ?Take 1 tablet (5 mg total) by mouth daily. ?What changed: when to take this ?  ?levothyroxine 88 MCG tablet ?Commonly known as:  SYNTHROID ?Take 1 tablet (88 mcg total) by mouth daily. ?What changed: when to take this ?  ?metFORMIN 500 MG 24 hr tablet ?Commonly known as: GLUCOPHAGE-XR ?Take 1 tablet (500 mg total) by mouth 2 (two) times daily. ?What changed:  ?how much to take ?when to take this ?  ?mupirocin ointment 2 % ?Commonly known as: BACTROBAN ?Apply 1 application topically 2 (two) times daily. ?  ?ondansetron 4 MG tablet ?Commonly known as: ZOFRAN ?Take 1 tablet (4 mg total) by mouth every 6 (six) hours as needed for nausea. ?  ?OneTouch Verio test strip ?Generic drug: glucose blood ?TEST BLOOD SUGAR TWICE DAILY AS DIRECTED ?  ?glucose blood test strip ?Use as instructed to check blood sugars to check blood sugars twice daily. Dx E11.9 ?  ?pantoprazole 40 MG tablet ?Commonly known as: PROTONIX ?TAKE 1 TABLET BY MOUTH  DAILY ?What changed:  ?how much to take ?when to take this ?  ?rosuvastatin 40 MG tablet ?Commonly known as: CRESTOR ?Take 1 tablet (40 mg total) by mouth daily. ?What changed: when to take this ?  ?traMADol 50 MG tablet ?Commonly known as: ULTRAM ?Take 1-2 tablets (50-100 mg total) by mouth every 4 (four) hours as needed for moderate pain. ?  ?traZODone 50 MG tablet ?Commonly known as:  DESYREL ?Take 1 tablet (50 mg total) by mouth daily. ?What changed: when to take this ?  ?Trulicity 3 IR/5.1OA Sopn ?Generic drug: Dulaglutide ?Inject 3 mg into the skin once a week. ?What changed: additional instructions ?

## 2022-03-18 NOTE — Progress Notes (Signed)
Discharge Note: ?Reviewed discharge instructions with pt. Pt verbalized understanding. Compression stockings on. IV cath intact when removed. Pt discharged with all personal belongings, incentive spirometer, 3 in 1, and a  rolling walker. Staff wheeled pt out. Pt transported to home via family vehicle. ?

## 2022-03-18 NOTE — Progress Notes (Signed)
Offered patient a suppository due to her not having a bowel movement since 5/11. Patient refused suppository stating that she cannot have a bowel movement while away from home or on vacation. She stated that she will have one when she gets home. Educated patient and returned medication. ?

## 2022-03-18 NOTE — Evaluation (Signed)
Occupational Therapy Evaluation ?Patient Details ?Name: Jody Wang ?MRN: [id number] ?DOB: 1945/12/01 ?Today's Date: 03/18/2022 ? ? ?History of Present Illness Pt is a 76 yo female s/p R TKA. PMH of CAD, DM, heart murmur.  ? ?Clinical Impression ?  ?Pt seen for OT evaluation this date, POD#1 from above surgery. PTA pt was MOD I-I in all ADL/IADL, has assist from family if needed. Pt is eager to return to PLOF with less pain and improved safety and independence. Pt performed all ADL at supervision level with intermittens vcs for technique. Pt provided education re: polar care mgt, falls prevention strategies, home/routines modifications, DME/AE for LB bathing and dressing tasks, and compression stocking mgt. Pt would benefit from skilled OT services including additional instruction in dressing techniques with or without assistive devices for dressing and bathing skills to support recall and carryover prior to discharge and ultimately to maximize safety, independence, and minimize falls risk and caregiver burden. Do not currently anticipate any OT needs following this hospitalization.   Pt is left in bedside chair, NAD, all needs met. OT will follow acutely.  ?   ? ?Recommendations for follow up therapy are one component of a multi-disciplinary discharge planning process, led by the attending physician.  Recommendations may be updated based on patient status, additional functional criteria and insurance authorization.  ? ?Follow Up Recommendations ? No OT follow up  ?  ?Assistance Recommended at Discharge Frequent or constant Supervision/Assistance  ?Patient can return home with the following Assistance with cooking/housework;Assist for transportation ? ?  ?Functional Status Assessment ? Patient has had a recent decline in their functional status and demonstrates the ability to make significant improvements in function in a reasonable and predictable amount of time.  ?Equipment Recommendations ? BSC/3in1 (pt will  purchase bsc if needed)  ?  ?Recommendations for Other Services   ? ? ?  ?Precautions / Restrictions Precautions ?Precautions: Fall;Knee ?Precaution Booklet Issued: Yes (comment) ?Restrictions ?Weight Bearing Restrictions: Yes ?RLE Weight Bearing: Weight bearing as tolerated  ? ?  ? ?Mobility Bed Mobility ?Overal bed mobility: Needs Assistance ?Bed Mobility: Supine to Sit ?  ?  ?Supine to sit: Modified independent (Device/Increase time) ?  ?  ?  ?  ? ?Transfers ?Overall transfer level: Needs assistance ?Equipment used: Rolling walker (2 wheels) ?Transfers: Sit to/from Stand ?Sit to Stand: Supervision ?  ?  ?  ?  ?  ?General transfer comment: one vc for hand placement ?  ? ?  ?Balance Overall balance assessment: Needs assistance ?Sitting-balance support: Feet supported ?Sitting balance-Leahy Scale: Good ?  ?  ?Standing balance support: Bilateral upper extremity supported, During functional activity ?Standing balance-Leahy Scale: Good ?  ?  ?  ?  ?  ?  ?  ?  ?  ?  ?  ?  ?   ? ?ADL either performed or assessed with clinical judgement  ? ?ADL Overall ADL's : Needs assistance/impaired ?Eating/Feeding: Sitting;Set up ?  ?Grooming: Wash/dry hands;Wash/dry face;Oral care;Standing;Supervision/safety ?Grooming Details (indicate cue type and reason): with RW at sink level ?  ?  ?  ?  ?Upper Body Dressing : Set up;Sitting ?  ?Lower Body Dressing: Set up;Sit to/from stand ?Lower Body Dressing Details (indicate cue type and reason): socks, simulated toileting ?Toilet Transfer: Supervision/safety;BSC/3in1;Ambulation ?Toilet Transfer Details (indicate cue type and reason): one vc for technique ?Toileting- Clothing Manipulation and Hygiene: Sit to/from stand;Supervision/safety ?  ?  ?  ?Functional mobility during ADLs: Supervision/safety;Rolling walker (2 wheels) (house hold distances) ?   ? ? ? ?  Vision Patient Visual Report: No change from baseline ?   ?   ?Perception   ?  ?Praxis   ?  ? ?Pertinent Vitals/Pain Pain Assessment ?Pain  Assessment: 0-10 ?Pain Score: 4  ?Pain Location: R knee ?Pain Descriptors / Indicators: Aching ?Pain Intervention(s): Limited activity within patient's tolerance, Monitored during session, RN gave pain meds during session, Repositioned  ? ? ? ?Hand Dominance Right ?  ?Extremity/Trunk Assessment Upper Extremity Assessment ?Upper Extremity Assessment: Overall WFL for tasks assessed ?  ?Lower Extremity Assessment ?Lower Extremity Assessment: Defer to PT evaluation (s/p R TKA) ?  ?Cervical / Trunk Assessment ?Cervical / Trunk Assessment: Normal ?  ?Communication Communication ?Communication: No difficulties ?  ?Cognition Arousal/Alertness: Awake/alert ?Behavior During Therapy: [facility] for tasks assessed/performed ?Overall Cognitive Status: Within Functional Limits for tasks assessed ?  ?  ?  ?  ?  ?  ?  ?  ?  ?  ?  ?  ?  ?  ?  ?  ?  ?  ?  ?General Comments  vss throughout ? ?  ?Exercises   ?  ?Shoulder Instructions    ? ? ?Home Living Family/patient expects to be discharged to:: Private residence ?Living Arrangements: Children ?Available Help at Discharge: Family;Friend(s);Available 24 hours/day ?Type of Home: House ?Home Access: Level entry ?  ?  ?Home Layout: One level ?  ?  ?Bathroom Shower/Tub: Walk-in shower ?  ?Bathroom Toilet: Handicapped height ?  ?  ?Home Equipment: Kasandra Knudsen - single point;Shower seat - built in ?  ?Additional Comments: raised toilet seat ?  ? ?  ?Prior Functioning/Environment Prior Level of Function : Independent/Modified Independent ?  ?  ?  ?  ?  ?  ?Mobility Comments: MOD I with cane ?ADLs Comments: MOD I-I ina ll ADL, IADL assist as needed but typically MOD I-I ?  ? ?  ?  ?OT Problem List: Decreased strength;Decreased activity tolerance;Decreased knowledge of use of DME or AE ?  ?   ?OT Treatment/Interventions: Self-care/ADL training;DME and/or AE instruction;Therapeutic exercise;Patient/family education;Therapeutic activities  ?  ?OT Goals(Current goals can be found in the care plan section)  Acute Rehab OT Goals ?Patient Stated Goal: go home ?OT Goal Formulation: With patient ?Time For Goal Achievement: 04/01/22 ?Potential to Achieve Goals: Good ?ADL Goals ?Pt Will Transfer to Toilet: with modified independence;ambulating ?Pt Will Perform Toileting - Clothing Manipulation and hygiene: with modified independence  ?OT Frequency: Min 2X/week ?  ? ?Co-evaluation   ?  ?  ?  ?  ? ?  ?AM-PAC OT "6 Clicks" Daily Activity     ?Outcome Measure Help from another person eating meals?: None ?Help from another person taking care of personal grooming?: None ?Help from another person toileting, which includes using toliet, bedpan, or urinal?: None ?Help from another person bathing (including washing, rinsing, drying)?: None ?Help from another person to put on and taking off regular upper body clothing?: None ?Help from another person to put on and taking off regular lower body clothing?: None ?6 Click Score: 24 ?  ?End of Session Equipment Utilized During Treatment: Rolling walker (2 wheels) ?Nurse Communication: Mobility status ? ?Activity Tolerance: Patient tolerated treatment well ?Patient left: in chair;with call bell/phone within reach;with chair alarm set ? ?OT Visit Diagnosis: Unsteadiness on feet (R26.81)  ?              ?Time: 0737-1062 ?OT Time Calculation (min): 23 min ?Charges:  OT General Charges ?$OT Visit: 1 Visit ?OT Evaluation ?$OT Eval Low  Complexity: 1 Low ? ?Shanon Payor, OTD OTR/L  ?03/18/22, 9:35 AM  ?

## 2022-03-18 NOTE — Progress Notes (Signed)
Physical Therapy Treatment ?Patient Details ?Name: Jody Wang ?MRN: [id number] ?DOB: 03-02-1946 ?Today's Date: 03/18/2022 ? ? ?History of Present Illness Pt is a 76 yo female s/p R TKA. PMH of CAD, DM, heart murmur. ? ?  ?PT Comments  ? ? Pt was sitting in recline upon arriving. She performed well throughout session and is cleared for safe DC home. HHPT to start tomorrow (per patient). She will greatly benefit from continued skilled PT to progress pt to maximal independence with ADLs.  ?  ?Recommendations for follow up therapy are one component of a multi-disciplinary discharge planning process, led by the attending physician.  Recommendations may be updated based on patient status, additional functional criteria and insurance authorization. ? ?Follow Up Recommendations ? Home health PT ?  ?  ?Assistance Recommended at Discharge Frequent or constant Supervision/Assistance  ?Patient can return home with the following Assistance with cooking/housework;Assist for transportation;Help with stairs or ramp for entrance;Direct supervision/assist for medications management ?  ?Equipment Recommendations ? Rolling walker (2 wheels)  ?  ?   ?Precautions / Restrictions Precautions ?Precautions: Fall;Knee ?Precaution Booklet Issued: Yes (comment) ?Restrictions ?Weight Bearing Restrictions: Yes ?RLE Weight Bearing: Weight bearing as tolerated  ?  ? ?Mobility ? Bed Mobility ?Overal bed mobility: Modified Independent ?  ?Transfers ?Overall transfer level: Needs assistance ?Equipment used: Rolling walker (2 wheels) ?Transfers: Sit to/from Stand ?Sit to Stand: Supervision ?  ?  ?Ambulation/Gait ?Ambulation/Gait assistance: Supervision ?Gait Distance (Feet): 200 Feet ?Assistive device: Rolling walker (2 wheels) ?Gait Pattern/deviations: Step-through pattern ?Gait velocity: WNL ?  ?  ?General Gait Details: no LOB or safety concern ? ?  ?Balance Overall balance assessment: Needs assistance ?Sitting-balance support: Feet  supported ?Sitting balance-Leahy Scale: Good ?  ?  ?Standing balance support: Bilateral upper extremity supported, Reliant on assistive device for balance, During functional activity ?Standing balance-Leahy Scale: Good ?  ?  ?  ?Cognition Arousal/Alertness: Awake/alert ?Behavior During Therapy: [facility] for tasks assessed/performed ?Overall Cognitive Status: Within Functional Limits for tasks assessed ?  ?  ?   ?General Comments: Pt is A and O x 4 ?  ?  ? ?  ?   ?General Comments General comments (skin integrity, edema, etc.): vss throughout ?  ?  ? ?Pertinent Vitals/Pain Pain Assessment ?Pain Assessment: 0-10 ?Pain Score: 4  ?Pain Location: R knee ?Pain Descriptors / Indicators: Aching ?Pain Intervention(s): Limited activity within patient's tolerance, Monitored during session, Premedicated before session, Repositioned, Ice applied  ? ? ?Home Living Family/patient expects to be discharged to:: Private residence ?Living Arrangements: Children ?Available Help at Discharge: Family;Friend(s);Available 24 hours/day ?Type of Home: House ?Home Access: Level entry ?  ?  ?  ?Home Layout: One level ?Home Equipment: Kasandra Knudsen - single point;Shower seat - built in ?Additional Comments: raised toilet seat  ?  ?   ? ?PT Goals (current goals can now be found in the care plan section) Acute Rehab PT Goals ?Patient Stated Goal: to go home ?Progress towards PT goals: Progressing toward goals ? ?  ?Frequency ? ? ? BID ? ? ? ?  ?PT Plan Current plan remains appropriate  ? ? ?   ?AM-PAC PT "6 Clicks" Mobility   ?Outcome Measure ? Help needed turning from your back to your side while in a flat bed without using bedrails?: None ?Help needed moving from lying on your back to sitting on the side of a flat bed without using bedrails?: None ?Help needed moving to and from a bed to a chair (including  a wheelchair)?: None ?Help needed standing up from a chair using your arms (e.g., wheelchair or bedside chair)?: None ?Help needed to walk in hospital  room?: A Little ?Help needed climbing 3-5 steps with a railing? : A Little ?6 Click Score: 22 ? ?  ?End of Session   ?Activity Tolerance: Patient tolerated treatment well ?Patient left: with chair alarm set;in chair;with call bell/phone within reach ?Nurse Communication: Mobility status ?PT Visit Diagnosis: Other abnormalities of gait and mobility (R26.89);Difficulty in walking, not elsewhere classified (R26.2);Pain;Muscle weakness (generalized) (M62.81) ?Pain - Right/Left: Right ?Pain - part of body: Knee ?  ? ? ?Time: 9604-5409 ?PT Time Calculation (min) (ACUTE ONLY): 20 min ? ?Charges:  $Gait Training: 8-22 mins          ?          ?Julaine Fusi PTA ?03/18/22, 9:47 AM  ? ?

## 2022-03-18 NOTE — Progress Notes (Signed)
Subjective: ?1 Day Post-Op Procedure(s) (LRB): ?COMPUTER ASSISTED TOTAL KNEE ARTHROPLASTY (Right) ?Patient reports pain as mild.   ?Patient is well, and has had no acute complaints or problems ?Plan is to go Home after hospital stay. ?Negative for chest pain and shortness of breath ?Fever: no ?Gastrointestinal:Negative for nausea and vomiting ?Patient states that she is passing gas this morning. ? ?Objective: ?Vital signs in last 24 hours: ?Temp:  [97.7 ?F (36.5 ?C)-98.1 ?F (36.7 ?C)] 98.1 ?F (36.7 ?C) (05/13 0329) ?Pulse Rate:  [81-113] 96 (05/13 0329) ?Resp:  [8-19] 13 (05/13 0329) ?BP: (87-136)/(52-82) 120/82 (05/13 0329) ?SpO2:  [90 %-100 %] 98 % (05/13 0329) ? ?Intake/Output from previous day: ? ?Intake/Output Summary (Last 24 hours) at 03/18/2022 0849 ?Last data filed at 03/18/2022 [phone] ?Gross per 24 hour  ?Intake 1800.76 ml  ?Output 905 ml  ?Net 895.76 ml  ?  ?Intake/Output this shift: ?No intake/output data recorded. ? ?Labs: ?No results for input(s): HGB in the last 72 hours. ?No results for input(s): WBC, RBC, HCT, PLT in the last 72 hours. ?No results for input(s): NA, K, CL, CO2, BUN, CREATININE, GLUCOSE, CALCIUM in the last 72 hours. ?No results for input(s): LABPT, INR in the last 72 hours. ? ? ?EXAM ?General - Patient is Alert, Appropriate, and Oriented ?Extremity - ABD soft ?Neurovascular intact ?Dorsiflexion/Plantar flexion intact ?Incision: scant drainage ?No cellulitis present ?Dressing/Incision - Bulky dressing removed, honeycomb intact, mild drainage.  Hemovac with mild bloody drainage, removed without issue.  Tegaderm re-applied. ?Motor Function - intact, moving foot and toes well on exam.  ?ABdomen soft with intact bowel sounds. ?Negative Homans to bilateral legs. ?Able to perform straight leg raise without issue. ? ?Past Medical History:  ?Diagnosis Date  ? AK (actinic keratosis)   ? Anemia   ? Aortic atherosclerosis ([place])   ? Carotid artery disease ([place])   ? Carotid bruit   ? Complication of  anesthesia   ? hard time waking up x 1 surgery  ? Depression   ? Diabetes mellitus without complication ([place])   ? GERD (gastroesophageal reflux disease)   ? Heart murmur   ? Hypercholesterolemia   ? Hypothyroidism   ? Lung nodule, multiple   ? Osteoarthritis of knee   ? Shingles   ? Wears dentures   ? partial lower  ? ? ?Assessment/Plan: ?1 Day Post-Op Procedure(s) (LRB): ?COMPUTER ASSISTED TOTAL KNEE ARTHROPLASTY (Right) ?Principal Problem: ?  Total knee replacement status ? ?Estimated body mass index is 25.45 kg/m? as calculated from the following: ?  Height as of this encounter: '5\' 1"'$  (1.549 m). ?  Weight as of this encounter: 61.1 kg. ?Advance diet ?Up with therapy ?D/C IV fluids when tolerating po intake. ? ?Vitals stable this morning.  No fevers. ?Bulky dressing removed, hemovac removed without issue. ?Patient is passing gas, denies any N/V or abdominal pain. ?Up with therapy today.  Has one stair to climb at home. ?Plan for discharge home this afternoon pending progress with PT. ? ?DVT Prophylaxis - TED hose and Lovenox and SCDs ?Weight-Bearing as tolerated to right leg ? ?Raquel Richard Ritchey, PA-C ?[facility] ?03/18/2022, 8:49 AM  ?

## 2022-03-18 NOTE — TOC Initial Note (Signed)
Transition of Care (TOC) - Initial/Assessment Note  ? ? ?Patient Details  ?Name: Jody Wang ?MRN: [id number] ?Date of Birth: 1945/12/12 ? ?Transition of Care (TOC) CM/SW Contact:    ?[name], LCSW ?Phone Number: ?03/18/2022, 10:01 AM ? ?Clinical Narrative:                CSW spoke to patient regarding DC planning. ?Patient was prearranged by Surgeon's office with [facility]. Notified patient who is in agreement. Notified [place] with [facility] [place].  ?Patient needs 3in1 and RW. Patient is agreeable to this being ordered. Referral made to [facility] with [facility], DME to be delivered to bedside prior to DC. ?Patient's PCP is Dr. Nicki Reaper. Patient's son will transport her home when DC.  ? ? ?Expected Discharge Plan: [place] ?Barriers to Discharge: Barriers Resolved ? ? ?Patient Goals and CMS Choice ?Patient states their goals for this hospitalization and ongoing recovery are:: home with home health ?CMS Medicare.gov Compare Post Acute Care list provided to:: Patient ?Choice offered to / list presented to : Patient ? ?Expected Discharge Plan and Services ?Expected Discharge Plan: [place] ?  ?  ?  ?Living arrangements for the past 2 months: [place] ?Expected Discharge Date: 03/18/22               ?DME Arranged: 3-N-1, Walker rolling ?DME Agency: [employer] ?Date DME Agency Contacted: 03/18/22 ?  ?Representative spoke with at DME Agency: Delana Meyer ?HH Arranged: PT ?[place] Agency: [place] ?Date HH Agency Contacted: 03/18/22 ?  ?Representative spoke with at [place]: [place] ? ?Prior Living Arrangements/Services ?Living arrangements for the past 2 months: [employer] ?  ?Patient language and need for interpreter reviewed:: Yes ?Do you feel safe going back to the place where you live?: Yes      ?Need for Family Participation in Patient Care: Yes (Comment) ?Care giver support system in place?: Yes (comment) ?  ?Criminal Activity/Legal Involvement  Pertinent to Current Situation/Hospitalization: No - Comment as needed ? ?Activities of Daily Living ?Home Assistive Devices/Equipment: Eyeglasses, CBG Meter ?ADL Screening (condition at time of admission) ?Patient's cognitive ability adequate to safely complete daily activities?: Yes ?Is the patient deaf or have difficulty hearing?: Yes ?Does the patient have difficulty seeing, even when wearing glasses/contacts?: No ?Does the patient have difficulty concentrating, remembering, or making decisions?: No ?Patient able to express need for assistance with ADLs?: Yes ?Does the patient have difficulty dressing or bathing?: No ?Independently performs ADLs?: Yes (appropriate for developmental age) ?Does the patient have difficulty walking or climbing stairs?: Yes ?Weakness of Legs: Right ?Weakness of Arms/Hands: None ? ?Permission Sought/Granted ?Permission sought to share information with : Customer service manager ?Permission granted to share information with : Yes, Verbal Permission Granted ?   ? Permission granted to share info w AGENCY: [place] and DME agencies ?   ?   ? ?Emotional Assessment ?  ?  ?  ?Orientation: : Oriented to Self, Oriented to Place, Oriented to  Time, Oriented to Situation ?Alcohol / Substance Use: Not Applicable ?Psych Involvement: No (comment) ? ?Admission diagnosis:  Total knee replacement status [Z96.659] ?Patient Active Problem List  ? Diagnosis Date Noted  ? Total knee replacement status 03/17/2022  ? Localized, primary osteoarthritis of shoulder region 02/19/2022  ? Primary osteoarthritis of left knee 02/19/2022  ? Non-recurrent acute serous otitis media of right ear 01/11/2022  ? Joint pain 11/13/2021  ? Memory change 10/11/2020  ?  Carotid bruit 06/06/2020  ? Anemia 02/22/2020  ? Acute cough 01/19/2020  ? Lung nodule, multiple 03/14/2018  ? AK (actinic keratosis) 03/14/2018  ? Aortic atherosclerosis ([place]) 05/14/2017  ? Coronary artery disease of native artery of native heart with stable  angina pectoris ([place]) 05/14/2017  ? Abnormal CT of the chest 02/11/2017  ? SOB (shortness of breath) on exertion 02/01/2017  ? Chest pain 01/28/2017  ? Primary osteoarthritis of right knee 08/29/2016  ? Shingles 07/02/2016  ? Knee pain 07/11/2015  ? Health care maintenance 02/27/2015  ? Carotid artery disease ([place]) 02/27/2015  ? Carotid stenosis 02/27/2015  ? Cerumen impaction 06/21/2014  ? Weight loss 04/12/2014  ? Environmental allergies 04/12/2014  ? Right shoulder pain 10/08/2013  ? GERD (gastroesophageal reflux disease) 10/09/2012  ? Mild depression 10/08/2012  ? Hypercholesterolemia 10/08/2012  ? Hypothyroidism 10/08/2012  ? Type 2 diabetes mellitus with hyperglycemia ([place]) 10/08/2012  ? ?PCP:  Einar Pheasant, MD ?Pharmacy:   ?[facility] [place], [place] AT [facility] OF [address] ?[place] ?[place] [address] ?Phone: [phone] Fax: [phone] ? ?[facility] ([facility] ) - [place], [address] ?[address] ?[place] [address] ?Phone: [phone] Fax: [phone] ? ? ? ? ?Social Determinants of Health (SDOH) Interventions ?  ? ?Readmission Risk Interventions ?   ? View : No data to display.  ?  ?  ?  ? ? ? ?

## 2022-03-19 DIAGNOSIS — R634 Abnormal weight loss: Secondary | ICD-10-CM | POA: Diagnosis not present

## 2022-03-19 DIAGNOSIS — Z9841 Cataract extraction status, right eye: Secondary | ICD-10-CM | POA: Diagnosis not present

## 2022-03-19 DIAGNOSIS — Z7985 Long-term (current) use of injectable non-insulin antidiabetic drugs: Secondary | ICD-10-CM | POA: Diagnosis not present

## 2022-03-19 DIAGNOSIS — K219 Gastro-esophageal reflux disease without esophagitis: Secondary | ICD-10-CM | POA: Diagnosis not present

## 2022-03-19 DIAGNOSIS — Z96651 Presence of right artificial knee joint: Secondary | ICD-10-CM | POA: Diagnosis not present

## 2022-03-19 DIAGNOSIS — D649 Anemia, unspecified: Secondary | ICD-10-CM | POA: Diagnosis not present

## 2022-03-19 DIAGNOSIS — Z7984 Long term (current) use of oral hypoglycemic drugs: Secondary | ICD-10-CM | POA: Diagnosis not present

## 2022-03-19 DIAGNOSIS — E7800 Pure hypercholesterolemia, unspecified: Secondary | ICD-10-CM | POA: Diagnosis not present

## 2022-03-19 DIAGNOSIS — Z7901 Long term (current) use of anticoagulants: Secondary | ICD-10-CM | POA: Diagnosis not present

## 2022-03-19 DIAGNOSIS — Z791 Long term (current) use of non-steroidal anti-inflammatories (NSAID): Secondary | ICD-10-CM | POA: Diagnosis not present

## 2022-03-19 DIAGNOSIS — I6529 Occlusion and stenosis of unspecified carotid artery: Secondary | ICD-10-CM | POA: Diagnosis not present

## 2022-03-19 DIAGNOSIS — Z87891 Personal history of nicotine dependence: Secondary | ICD-10-CM | POA: Diagnosis not present

## 2022-03-19 DIAGNOSIS — Z471 Aftercare following joint replacement surgery: Secondary | ICD-10-CM | POA: Diagnosis not present

## 2022-03-19 DIAGNOSIS — M1712 Unilateral primary osteoarthritis, left knee: Secondary | ICD-10-CM | POA: Diagnosis not present

## 2022-03-19 DIAGNOSIS — E119 Type 2 diabetes mellitus without complications: Secondary | ICD-10-CM | POA: Diagnosis not present

## 2022-03-19 DIAGNOSIS — I700 Atherosclerosis of aorta: Secondary | ICD-10-CM | POA: Diagnosis not present

## 2022-03-19 DIAGNOSIS — F32A Depression, unspecified: Secondary | ICD-10-CM | POA: Diagnosis not present

## 2022-03-19 DIAGNOSIS — E039 Hypothyroidism, unspecified: Secondary | ICD-10-CM | POA: Diagnosis not present

## 2022-03-19 DIAGNOSIS — Z9842 Cataract extraction status, left eye: Secondary | ICD-10-CM | POA: Diagnosis not present

## 2022-03-19 DIAGNOSIS — I2510 Atherosclerotic heart disease of native coronary artery without angina pectoris: Secondary | ICD-10-CM | POA: Diagnosis not present

## 2022-03-19 DIAGNOSIS — M19019 Primary osteoarthritis, unspecified shoulder: Secondary | ICD-10-CM | POA: Diagnosis not present

## 2022-03-19 DIAGNOSIS — R911 Solitary pulmonary nodule: Secondary | ICD-10-CM | POA: Diagnosis not present

## 2022-03-20 DIAGNOSIS — R911 Solitary pulmonary nodule: Secondary | ICD-10-CM | POA: Diagnosis not present

## 2022-03-20 DIAGNOSIS — R634 Abnormal weight loss: Secondary | ICD-10-CM | POA: Diagnosis not present

## 2022-03-20 DIAGNOSIS — I700 Atherosclerosis of aorta: Secondary | ICD-10-CM | POA: Diagnosis not present

## 2022-03-20 DIAGNOSIS — Z791 Long term (current) use of non-steroidal anti-inflammatories (NSAID): Secondary | ICD-10-CM | POA: Diagnosis not present

## 2022-03-20 DIAGNOSIS — Z7985 Long-term (current) use of injectable non-insulin antidiabetic drugs: Secondary | ICD-10-CM | POA: Diagnosis not present

## 2022-03-20 DIAGNOSIS — E119 Type 2 diabetes mellitus without complications: Secondary | ICD-10-CM | POA: Diagnosis not present

## 2022-03-20 DIAGNOSIS — M1712 Unilateral primary osteoarthritis, left knee: Secondary | ICD-10-CM | POA: Diagnosis not present

## 2022-03-20 DIAGNOSIS — F32A Depression, unspecified: Secondary | ICD-10-CM | POA: Diagnosis not present

## 2022-03-20 DIAGNOSIS — I2510 Atherosclerotic heart disease of native coronary artery without angina pectoris: Secondary | ICD-10-CM | POA: Diagnosis not present

## 2022-03-20 DIAGNOSIS — Z9841 Cataract extraction status, right eye: Secondary | ICD-10-CM | POA: Diagnosis not present

## 2022-03-20 DIAGNOSIS — Z9842 Cataract extraction status, left eye: Secondary | ICD-10-CM | POA: Diagnosis not present

## 2022-03-20 DIAGNOSIS — Z96651 Presence of right artificial knee joint: Secondary | ICD-10-CM | POA: Diagnosis not present

## 2022-03-20 DIAGNOSIS — Z7984 Long term (current) use of oral hypoglycemic drugs: Secondary | ICD-10-CM | POA: Diagnosis not present

## 2022-03-20 DIAGNOSIS — Z7901 Long term (current) use of anticoagulants: Secondary | ICD-10-CM | POA: Diagnosis not present

## 2022-03-20 DIAGNOSIS — Z87891 Personal history of nicotine dependence: Secondary | ICD-10-CM | POA: Diagnosis not present

## 2022-03-20 DIAGNOSIS — E7800 Pure hypercholesterolemia, unspecified: Secondary | ICD-10-CM | POA: Diagnosis not present

## 2022-03-20 DIAGNOSIS — E039 Hypothyroidism, unspecified: Secondary | ICD-10-CM | POA: Diagnosis not present

## 2022-03-20 DIAGNOSIS — M19019 Primary osteoarthritis, unspecified shoulder: Secondary | ICD-10-CM | POA: Diagnosis not present

## 2022-03-20 DIAGNOSIS — K219 Gastro-esophageal reflux disease without esophagitis: Secondary | ICD-10-CM | POA: Diagnosis not present

## 2022-03-20 DIAGNOSIS — D649 Anemia, unspecified: Secondary | ICD-10-CM | POA: Diagnosis not present

## 2022-03-20 DIAGNOSIS — Z471 Aftercare following joint replacement surgery: Secondary | ICD-10-CM | POA: Diagnosis not present

## 2022-03-20 DIAGNOSIS — I6529 Occlusion and stenosis of unspecified carotid artery: Secondary | ICD-10-CM | POA: Diagnosis not present

## 2022-03-22 DIAGNOSIS — Z9841 Cataract extraction status, right eye: Secondary | ICD-10-CM | POA: Diagnosis not present

## 2022-03-22 DIAGNOSIS — E7800 Pure hypercholesterolemia, unspecified: Secondary | ICD-10-CM | POA: Diagnosis not present

## 2022-03-22 DIAGNOSIS — K219 Gastro-esophageal reflux disease without esophagitis: Secondary | ICD-10-CM | POA: Diagnosis not present

## 2022-03-22 DIAGNOSIS — Z7984 Long term (current) use of oral hypoglycemic drugs: Secondary | ICD-10-CM | POA: Diagnosis not present

## 2022-03-22 DIAGNOSIS — I2510 Atherosclerotic heart disease of native coronary artery without angina pectoris: Secondary | ICD-10-CM | POA: Diagnosis not present

## 2022-03-22 DIAGNOSIS — E119 Type 2 diabetes mellitus without complications: Secondary | ICD-10-CM | POA: Diagnosis not present

## 2022-03-22 DIAGNOSIS — M1712 Unilateral primary osteoarthritis, left knee: Secondary | ICD-10-CM | POA: Diagnosis not present

## 2022-03-22 DIAGNOSIS — E039 Hypothyroidism, unspecified: Secondary | ICD-10-CM | POA: Diagnosis not present

## 2022-03-22 DIAGNOSIS — I6529 Occlusion and stenosis of unspecified carotid artery: Secondary | ICD-10-CM | POA: Diagnosis not present

## 2022-03-22 DIAGNOSIS — R911 Solitary pulmonary nodule: Secondary | ICD-10-CM | POA: Diagnosis not present

## 2022-03-22 DIAGNOSIS — D649 Anemia, unspecified: Secondary | ICD-10-CM | POA: Diagnosis not present

## 2022-03-22 DIAGNOSIS — Z791 Long term (current) use of non-steroidal anti-inflammatories (NSAID): Secondary | ICD-10-CM | POA: Diagnosis not present

## 2022-03-22 DIAGNOSIS — Z96651 Presence of right artificial knee joint: Secondary | ICD-10-CM | POA: Diagnosis not present

## 2022-03-22 DIAGNOSIS — R634 Abnormal weight loss: Secondary | ICD-10-CM | POA: Diagnosis not present

## 2022-03-22 DIAGNOSIS — Z87891 Personal history of nicotine dependence: Secondary | ICD-10-CM | POA: Diagnosis not present

## 2022-03-22 DIAGNOSIS — M19019 Primary osteoarthritis, unspecified shoulder: Secondary | ICD-10-CM | POA: Diagnosis not present

## 2022-03-22 DIAGNOSIS — Z471 Aftercare following joint replacement surgery: Secondary | ICD-10-CM | POA: Diagnosis not present

## 2022-03-22 DIAGNOSIS — I700 Atherosclerosis of aorta: Secondary | ICD-10-CM | POA: Diagnosis not present

## 2022-03-22 DIAGNOSIS — F32A Depression, unspecified: Secondary | ICD-10-CM | POA: Diagnosis not present

## 2022-03-22 DIAGNOSIS — Z7985 Long-term (current) use of injectable non-insulin antidiabetic drugs: Secondary | ICD-10-CM | POA: Diagnosis not present

## 2022-03-22 DIAGNOSIS — Z7901 Long term (current) use of anticoagulants: Secondary | ICD-10-CM | POA: Diagnosis not present

## 2022-03-22 DIAGNOSIS — Z9842 Cataract extraction status, left eye: Secondary | ICD-10-CM | POA: Diagnosis not present

## 2022-03-24 DIAGNOSIS — Z96651 Presence of right artificial knee joint: Secondary | ICD-10-CM | POA: Diagnosis not present

## 2022-03-24 DIAGNOSIS — Z7984 Long term (current) use of oral hypoglycemic drugs: Secondary | ICD-10-CM | POA: Diagnosis not present

## 2022-03-24 DIAGNOSIS — D649 Anemia, unspecified: Secondary | ICD-10-CM | POA: Diagnosis not present

## 2022-03-24 DIAGNOSIS — M19019 Primary osteoarthritis, unspecified shoulder: Secondary | ICD-10-CM | POA: Diagnosis not present

## 2022-03-24 DIAGNOSIS — F32A Depression, unspecified: Secondary | ICD-10-CM | POA: Diagnosis not present

## 2022-03-24 DIAGNOSIS — E119 Type 2 diabetes mellitus without complications: Secondary | ICD-10-CM | POA: Diagnosis not present

## 2022-03-24 DIAGNOSIS — E7800 Pure hypercholesterolemia, unspecified: Secondary | ICD-10-CM | POA: Diagnosis not present

## 2022-03-24 DIAGNOSIS — Z87891 Personal history of nicotine dependence: Secondary | ICD-10-CM | POA: Diagnosis not present

## 2022-03-24 DIAGNOSIS — Z7901 Long term (current) use of anticoagulants: Secondary | ICD-10-CM | POA: Diagnosis not present

## 2022-03-24 DIAGNOSIS — R911 Solitary pulmonary nodule: Secondary | ICD-10-CM | POA: Diagnosis not present

## 2022-03-24 DIAGNOSIS — E039 Hypothyroidism, unspecified: Secondary | ICD-10-CM | POA: Diagnosis not present

## 2022-03-24 DIAGNOSIS — Z9842 Cataract extraction status, left eye: Secondary | ICD-10-CM | POA: Diagnosis not present

## 2022-03-24 DIAGNOSIS — Z9841 Cataract extraction status, right eye: Secondary | ICD-10-CM | POA: Diagnosis not present

## 2022-03-24 DIAGNOSIS — Z791 Long term (current) use of non-steroidal anti-inflammatories (NSAID): Secondary | ICD-10-CM | POA: Diagnosis not present

## 2022-03-24 DIAGNOSIS — I700 Atherosclerosis of aorta: Secondary | ICD-10-CM | POA: Diagnosis not present

## 2022-03-24 DIAGNOSIS — M1712 Unilateral primary osteoarthritis, left knee: Secondary | ICD-10-CM | POA: Diagnosis not present

## 2022-03-24 DIAGNOSIS — Z471 Aftercare following joint replacement surgery: Secondary | ICD-10-CM | POA: Diagnosis not present

## 2022-03-24 DIAGNOSIS — K219 Gastro-esophageal reflux disease without esophagitis: Secondary | ICD-10-CM | POA: Diagnosis not present

## 2022-03-24 DIAGNOSIS — Z7985 Long-term (current) use of injectable non-insulin antidiabetic drugs: Secondary | ICD-10-CM | POA: Diagnosis not present

## 2022-03-24 DIAGNOSIS — I6529 Occlusion and stenosis of unspecified carotid artery: Secondary | ICD-10-CM | POA: Diagnosis not present

## 2022-03-24 DIAGNOSIS — R634 Abnormal weight loss: Secondary | ICD-10-CM | POA: Diagnosis not present

## 2022-03-24 DIAGNOSIS — I2510 Atherosclerotic heart disease of native coronary artery without angina pectoris: Secondary | ICD-10-CM | POA: Diagnosis not present

## 2022-03-27 DIAGNOSIS — R634 Abnormal weight loss: Secondary | ICD-10-CM | POA: Diagnosis not present

## 2022-03-27 DIAGNOSIS — E119 Type 2 diabetes mellitus without complications: Secondary | ICD-10-CM | POA: Diagnosis not present

## 2022-03-27 DIAGNOSIS — Z7984 Long term (current) use of oral hypoglycemic drugs: Secondary | ICD-10-CM | POA: Diagnosis not present

## 2022-03-27 DIAGNOSIS — M1712 Unilateral primary osteoarthritis, left knee: Secondary | ICD-10-CM | POA: Diagnosis not present

## 2022-03-27 DIAGNOSIS — Z87891 Personal history of nicotine dependence: Secondary | ICD-10-CM | POA: Diagnosis not present

## 2022-03-27 DIAGNOSIS — E039 Hypothyroidism, unspecified: Secondary | ICD-10-CM | POA: Diagnosis not present

## 2022-03-27 DIAGNOSIS — R911 Solitary pulmonary nodule: Secondary | ICD-10-CM | POA: Diagnosis not present

## 2022-03-27 DIAGNOSIS — E7800 Pure hypercholesterolemia, unspecified: Secondary | ICD-10-CM | POA: Diagnosis not present

## 2022-03-27 DIAGNOSIS — I2510 Atherosclerotic heart disease of native coronary artery without angina pectoris: Secondary | ICD-10-CM | POA: Diagnosis not present

## 2022-03-27 DIAGNOSIS — Z471 Aftercare following joint replacement surgery: Secondary | ICD-10-CM | POA: Diagnosis not present

## 2022-03-27 DIAGNOSIS — F32A Depression, unspecified: Secondary | ICD-10-CM | POA: Diagnosis not present

## 2022-03-27 DIAGNOSIS — I6529 Occlusion and stenosis of unspecified carotid artery: Secondary | ICD-10-CM | POA: Diagnosis not present

## 2022-03-27 DIAGNOSIS — D649 Anemia, unspecified: Secondary | ICD-10-CM | POA: Diagnosis not present

## 2022-03-27 DIAGNOSIS — M19019 Primary osteoarthritis, unspecified shoulder: Secondary | ICD-10-CM | POA: Diagnosis not present

## 2022-03-27 DIAGNOSIS — K219 Gastro-esophageal reflux disease without esophagitis: Secondary | ICD-10-CM | POA: Diagnosis not present

## 2022-03-27 DIAGNOSIS — Z96651 Presence of right artificial knee joint: Secondary | ICD-10-CM | POA: Diagnosis not present

## 2022-03-27 DIAGNOSIS — Z7901 Long term (current) use of anticoagulants: Secondary | ICD-10-CM | POA: Diagnosis not present

## 2022-03-27 DIAGNOSIS — Z9842 Cataract extraction status, left eye: Secondary | ICD-10-CM | POA: Diagnosis not present

## 2022-03-27 DIAGNOSIS — Z9841 Cataract extraction status, right eye: Secondary | ICD-10-CM | POA: Diagnosis not present

## 2022-03-27 DIAGNOSIS — I700 Atherosclerosis of aorta: Secondary | ICD-10-CM | POA: Diagnosis not present

## 2022-03-27 DIAGNOSIS — Z7985 Long-term (current) use of injectable non-insulin antidiabetic drugs: Secondary | ICD-10-CM | POA: Diagnosis not present

## 2022-03-27 DIAGNOSIS — Z791 Long term (current) use of non-steroidal anti-inflammatories (NSAID): Secondary | ICD-10-CM | POA: Diagnosis not present

## 2022-03-29 DIAGNOSIS — D649 Anemia, unspecified: Secondary | ICD-10-CM | POA: Diagnosis not present

## 2022-03-29 DIAGNOSIS — E7800 Pure hypercholesterolemia, unspecified: Secondary | ICD-10-CM | POA: Diagnosis not present

## 2022-03-29 DIAGNOSIS — Z7985 Long-term (current) use of injectable non-insulin antidiabetic drugs: Secondary | ICD-10-CM | POA: Diagnosis not present

## 2022-03-29 DIAGNOSIS — I2510 Atherosclerotic heart disease of native coronary artery without angina pectoris: Secondary | ICD-10-CM | POA: Diagnosis not present

## 2022-03-29 DIAGNOSIS — F32A Depression, unspecified: Secondary | ICD-10-CM | POA: Diagnosis not present

## 2022-03-29 DIAGNOSIS — R911 Solitary pulmonary nodule: Secondary | ICD-10-CM | POA: Diagnosis not present

## 2022-03-29 DIAGNOSIS — I6529 Occlusion and stenosis of unspecified carotid artery: Secondary | ICD-10-CM | POA: Diagnosis not present

## 2022-03-29 DIAGNOSIS — Z7901 Long term (current) use of anticoagulants: Secondary | ICD-10-CM | POA: Diagnosis not present

## 2022-03-29 DIAGNOSIS — K219 Gastro-esophageal reflux disease without esophagitis: Secondary | ICD-10-CM | POA: Diagnosis not present

## 2022-03-29 DIAGNOSIS — Z87891 Personal history of nicotine dependence: Secondary | ICD-10-CM | POA: Diagnosis not present

## 2022-03-29 DIAGNOSIS — M1712 Unilateral primary osteoarthritis, left knee: Secondary | ICD-10-CM | POA: Diagnosis not present

## 2022-03-29 DIAGNOSIS — Z791 Long term (current) use of non-steroidal anti-inflammatories (NSAID): Secondary | ICD-10-CM | POA: Diagnosis not present

## 2022-03-29 DIAGNOSIS — Z7984 Long term (current) use of oral hypoglycemic drugs: Secondary | ICD-10-CM | POA: Diagnosis not present

## 2022-03-29 DIAGNOSIS — M19019 Primary osteoarthritis, unspecified shoulder: Secondary | ICD-10-CM | POA: Diagnosis not present

## 2022-03-29 DIAGNOSIS — Z471 Aftercare following joint replacement surgery: Secondary | ICD-10-CM | POA: Diagnosis not present

## 2022-03-29 DIAGNOSIS — Z96651 Presence of right artificial knee joint: Secondary | ICD-10-CM | POA: Diagnosis not present

## 2022-03-29 DIAGNOSIS — R634 Abnormal weight loss: Secondary | ICD-10-CM | POA: Diagnosis not present

## 2022-03-29 DIAGNOSIS — Z9841 Cataract extraction status, right eye: Secondary | ICD-10-CM | POA: Diagnosis not present

## 2022-03-29 DIAGNOSIS — I700 Atherosclerosis of aorta: Secondary | ICD-10-CM | POA: Diagnosis not present

## 2022-03-29 DIAGNOSIS — Z9842 Cataract extraction status, left eye: Secondary | ICD-10-CM | POA: Diagnosis not present

## 2022-03-29 DIAGNOSIS — E119 Type 2 diabetes mellitus without complications: Secondary | ICD-10-CM | POA: Diagnosis not present

## 2022-03-29 DIAGNOSIS — E039 Hypothyroidism, unspecified: Secondary | ICD-10-CM | POA: Diagnosis not present

## 2022-03-31 DIAGNOSIS — M6281 Muscle weakness (generalized): Secondary | ICD-10-CM | POA: Diagnosis not present

## 2022-03-31 DIAGNOSIS — Z96651 Presence of right artificial knee joint: Secondary | ICD-10-CM | POA: Diagnosis not present

## 2022-03-31 DIAGNOSIS — M25561 Pain in right knee: Secondary | ICD-10-CM | POA: Diagnosis not present

## 2022-03-31 DIAGNOSIS — M25661 Stiffness of right knee, not elsewhere classified: Secondary | ICD-10-CM | POA: Diagnosis not present

## 2022-04-04 DIAGNOSIS — Z96651 Presence of right artificial knee joint: Secondary | ICD-10-CM | POA: Diagnosis not present

## 2022-04-04 DIAGNOSIS — M25561 Pain in right knee: Secondary | ICD-10-CM | POA: Diagnosis not present

## 2022-04-06 DIAGNOSIS — Z96651 Presence of right artificial knee joint: Secondary | ICD-10-CM | POA: Diagnosis not present

## 2022-04-06 DIAGNOSIS — M25561 Pain in right knee: Secondary | ICD-10-CM | POA: Diagnosis not present

## 2022-04-12 DIAGNOSIS — Z96651 Presence of right artificial knee joint: Secondary | ICD-10-CM | POA: Diagnosis not present

## 2022-04-12 DIAGNOSIS — M25561 Pain in right knee: Secondary | ICD-10-CM | POA: Diagnosis not present

## 2022-04-14 DIAGNOSIS — M25561 Pain in right knee: Secondary | ICD-10-CM | POA: Diagnosis not present

## 2022-04-14 DIAGNOSIS — Z96651 Presence of right artificial knee joint: Secondary | ICD-10-CM | POA: Diagnosis not present

## 2022-04-17 ENCOUNTER — Telehealth: Payer: Self-pay | Admitting: Internal Medicine

## 2022-04-17 ENCOUNTER — Other Ambulatory Visit: Payer: Self-pay

## 2022-04-17 DIAGNOSIS — J302 Other seasonal allergic rhinitis: Secondary | ICD-10-CM

## 2022-04-17 MED ORDER — LEVOCETIRIZINE DIHYDROCHLORIDE 5 MG PO TABS: 5.0000 mg | ORAL_TABLET | ORAL | 1 refills | Status: DC

## 2022-04-17 NOTE — Telephone Encounter (Signed)
Pt need refill sent to Levocetirizine sent to [facility]

## 2022-04-17 NOTE — Telephone Encounter (Signed)
Medication sent to [facility].  Pattricia Weiher,cma  

## 2022-04-19 DIAGNOSIS — Z96651 Presence of right artificial knee joint: Secondary | ICD-10-CM | POA: Diagnosis not present

## 2022-04-19 DIAGNOSIS — M25561 Pain in right knee: Secondary | ICD-10-CM | POA: Diagnosis not present

## 2022-04-21 DIAGNOSIS — M25561 Pain in right knee: Secondary | ICD-10-CM | POA: Diagnosis not present

## 2022-04-21 DIAGNOSIS — Z96651 Presence of right artificial knee joint: Secondary | ICD-10-CM | POA: Diagnosis not present

## 2022-04-26 DIAGNOSIS — Z96651 Presence of right artificial knee joint: Secondary | ICD-10-CM | POA: Diagnosis not present

## 2022-04-26 DIAGNOSIS — M25561 Pain in right knee: Secondary | ICD-10-CM | POA: Diagnosis not present

## 2022-04-28 DIAGNOSIS — Z96651 Presence of right artificial knee joint: Secondary | ICD-10-CM | POA: Diagnosis not present

## 2022-04-28 DIAGNOSIS — M25561 Pain in right knee: Secondary | ICD-10-CM | POA: Diagnosis not present

## 2022-05-01 DIAGNOSIS — M25561 Pain in right knee: Secondary | ICD-10-CM | POA: Diagnosis not present

## 2022-05-01 DIAGNOSIS — Z96651 Presence of right artificial knee joint: Secondary | ICD-10-CM | POA: Diagnosis not present

## 2022-05-04 DIAGNOSIS — Z96651 Presence of right artificial knee joint: Secondary | ICD-10-CM | POA: Diagnosis not present

## 2022-05-04 DIAGNOSIS — M25561 Pain in right knee: Secondary | ICD-10-CM | POA: Diagnosis not present

## 2022-05-08 ENCOUNTER — Other Ambulatory Visit: Payer: Self-pay | Admitting: Internal Medicine

## 2022-05-08 DIAGNOSIS — J302 Other seasonal allergic rhinitis: Secondary | ICD-10-CM

## 2022-05-17 DIAGNOSIS — H6123 Impacted cerumen, bilateral: Secondary | ICD-10-CM | POA: Diagnosis not present

## 2022-05-17 DIAGNOSIS — H90A32 Mixed conductive and sensorineural hearing loss, unilateral, left ear with restricted hearing on the contralateral side: Secondary | ICD-10-CM | POA: Diagnosis not present

## 2022-05-18 ENCOUNTER — Other Ambulatory Visit: Payer: Self-pay | Admitting: Internal Medicine

## 2022-05-22 ENCOUNTER — Telehealth: Payer: Self-pay

## 2022-05-22 NOTE — Telephone Encounter (Signed)
Patient states she needs a refill on her glucose blood (ONETOUCH VERIO) test strip.    *Patient states her preferred pharmacy is [facility].

## 2022-05-23 ENCOUNTER — Other Ambulatory Visit: Payer: Self-pay

## 2022-05-23 ENCOUNTER — Telehealth: Payer: Self-pay

## 2022-05-23 MED ORDER — ONETOUCH VERIO VI STRP: ORAL_STRIP | 3 refills | Status: DC

## 2022-05-23 NOTE — Telephone Encounter (Signed)
Sent to mail order

## 2022-05-23 NOTE — Telephone Encounter (Signed)
Pt overdue for f/u / labs   Lm to schedule

## 2022-05-23 NOTE — Telephone Encounter (Signed)
Patient returned office phone call, note was read.

## 2022-06-05 ENCOUNTER — Other Ambulatory Visit (INDEPENDENT_AMBULATORY_CARE_PROVIDER_SITE_OTHER): Payer: Medicare Other

## 2022-06-05 DIAGNOSIS — E7800 Pure hypercholesterolemia, unspecified: Secondary | ICD-10-CM | POA: Diagnosis not present

## 2022-06-05 DIAGNOSIS — E1165 Type 2 diabetes mellitus with hyperglycemia: Secondary | ICD-10-CM

## 2022-06-05 LAB — LIPID PANEL
Cholesterol: 125 mg/dL (ref 0–200)
HDL: 46.2 mg/dL (ref >39.00)
LDL Cholesterol: 59 mg/dL (ref 0–99)
NonHDL: 79
Total CHOL/HDL Ratio: 3
Triglycerides: 101 mg/dL (ref 0.0–149.0)
VLDL: 20.2 mg/dL (ref 0.0–40.0)

## 2022-06-05 LAB — BASIC METABOLIC PANEL
BUN: 15 mg/dL (ref 6–23)
CO2: 24 mEq/L (ref 19–32)
Calcium: 9 mg/dL (ref 8.4–10.5)
Chloride: 104 mEq/L (ref 96–112)
Creatinine, Ser: 0.79 mg/dL (ref 0.40–1.20)
GFR: 72.54 mL/min (ref >60.00)
Glucose, Bld: 171 mg/dL — ABNORMAL HIGH (ref 70–99)
Potassium: 4.6 mEq/L (ref 3.5–5.1)
Sodium: 138 mEq/L (ref 135–145)

## 2022-06-05 LAB — HEPATIC FUNCTION PANEL
ALT: 7 U/L (ref 0–35)
AST: 9 U/L (ref 0–37)
Albumin: 4.2 g/dL (ref 3.5–5.2)
Alkaline Phosphatase: 78 U/L (ref 39–117)
Bilirubin, Direct: 0.1 mg/dL (ref 0.0–0.3)
Total Bilirubin: 0.3 mg/dL (ref 0.2–1.2)
Total Protein: 6.9 g/dL (ref 6.0–8.3)

## 2022-06-05 LAB — HEMOGLOBIN A1C: Hgb A1c MFr Bld: 7.7 % — ABNORMAL HIGH (ref 4.6–6.5)

## 2022-06-08 ENCOUNTER — Encounter: Payer: Self-pay | Admitting: Internal Medicine

## 2022-06-08 ENCOUNTER — Ambulatory Visit (INDEPENDENT_AMBULATORY_CARE_PROVIDER_SITE_OTHER): Payer: Medicare Other | Admitting: Internal Medicine

## 2022-06-08 VITALS — BP 114/70 | HR 62 | Temp 98.0°F | Resp 15 | Ht 61.0 in | Wt 132.0 lb

## 2022-06-08 DIAGNOSIS — K219 Gastro-esophageal reflux disease without esophagitis: Secondary | ICD-10-CM | POA: Diagnosis not present

## 2022-06-08 DIAGNOSIS — D649 Anemia, unspecified: Secondary | ICD-10-CM | POA: Diagnosis not present

## 2022-06-08 DIAGNOSIS — Z96659 Presence of unspecified artificial knee joint: Secondary | ICD-10-CM | POA: Diagnosis not present

## 2022-06-08 DIAGNOSIS — I25118 Atherosclerotic heart disease of native coronary artery with other forms of angina pectoris: Secondary | ICD-10-CM | POA: Diagnosis not present

## 2022-06-08 DIAGNOSIS — E039 Hypothyroidism, unspecified: Secondary | ICD-10-CM

## 2022-06-08 DIAGNOSIS — I779 Disorder of arteries and arterioles, unspecified: Secondary | ICD-10-CM | POA: Diagnosis not present

## 2022-06-08 DIAGNOSIS — E1165 Type 2 diabetes mellitus with hyperglycemia: Secondary | ICD-10-CM

## 2022-06-08 DIAGNOSIS — F320 Major depressive disorder, single episode, mild: Secondary | ICD-10-CM

## 2022-06-08 DIAGNOSIS — I700 Atherosclerosis of aorta: Secondary | ICD-10-CM | POA: Diagnosis not present

## 2022-06-08 DIAGNOSIS — R918 Other nonspecific abnormal finding of lung field: Secondary | ICD-10-CM | POA: Diagnosis not present

## 2022-06-08 DIAGNOSIS — E7800 Pure hypercholesterolemia, unspecified: Secondary | ICD-10-CM

## 2022-06-08 NOTE — Progress Notes (Signed)
Patient ID: Jody Wang, adult   DOB: 1946/09/08, 76 y.o.   MRN: [id number]   Subjective:    Patient ID: Jody Wang, adult    DOB: Nov 12, 1945, 76 y.o.   MRN: [id number]   Patient here for a scheduled follow up .   HPI Here to follow up regarding her diabetes and cholesterol. Is s/p total knee arthroplasty.  Being followed by ortho.  She is accompanied by her son.  History obtained from both of them.  No chest pain or sob reported.  No abdominal pain.  Bowels moving.  No increased pain.  Has been to therapy.  Discussed continuing exercise at home.  Son lives with her.  Discussed recent labs.  A1c 7.7.  on trulicity and metformin. Feels is tolerating.  Some fatigue, but overall she feels she is doing well.     Past Medical History:  Diagnosis Date   AK (actinic keratosis)    Anemia    Aortic atherosclerosis (HCC)    Carotid artery disease (HCC)    Carotid bruit    Complication of anesthesia    hard time waking up x 1 surgery   Depression    Diabetes mellitus without complication (HCC)    GERD (gastroesophageal reflux disease)    Heart murmur    Hypercholesterolemia    Hypothyroidism    Lung nodule, multiple    Osteoarthritis of knee    Shingles    Wears dentures    partial lower   Past Surgical History:  Procedure Laterality Date   ABDOMINAL HYSTERECTOMY  1985   ovaries not removed   BREAST CYST EXCISION Right    BREAST CYST EXCISION Left    CATARACT EXTRACTION W/PHACO Right 02/16/2016   Procedure: CATARACT EXTRACTION PHACO AND INTRAOCULAR LENS PLACEMENT ([place]) RIGHT ;  Surgeon: Leandrew Koyanagi, MD;  Location: [facility];  Service: Ophthalmology;  Laterality: Right;  DIABETIC - oral meds   CATARACT EXTRACTION W/PHACO Left 03/15/2016   Procedure: CATARACT EXTRACTION PHACO AND INTRAOCULAR LENS PLACEMENT ([facility])left eye;  Surgeon: Leandrew Koyanagi, MD;  Location: [facility];  Service: Ophthalmology;  Laterality: Left;  DIABETIC - oral meds    FOOT SURGERY Bilateral    INNER EAR SURGERY Left    KNEE ARTHROPLASTY Right 03/17/2022   Procedure: COMPUTER ASSISTED TOTAL KNEE ARTHROPLASTY;  Surgeon: Dereck Leep, MD;  Location: [facility];  Service: Orthopedics;  Laterality: Right;   Family History  Problem Relation Age of Onset   Diabetes Father    Coronary artery disease Father        s/p CABG   Hypertension Father    Hypercholesterolemia Father    Hypertension Brother    Diabetes Brother    Hypertension Sister    Glaucoma Sister    Diabetes Other        niece   Breast cancer Maternal Aunt    Breast cancer Paternal Aunt    Cervical cancer Paternal Aunt    Breast cancer Cousin    Breast cancer Other    Social History   Socioeconomic History   Marital status: Widowed    Spouse name: Not on file   Number of children: 3   Years of education: Not on file   Highest education level: Not on file  Occupational History   Not on file  Tobacco Use   Smoking status: Former    Packs/day: 1.00    Years: 23.00    Total pack years: 23.00    Types: Cigarettes  Quit date: 11/20/1983    Years since quitting: 38.6   Smokeless tobacco: Never  Vaping Use   Vaping Use: Never used  Substance and Sexual Activity   Alcohol use: Yes    Comment: every monday   Drug use: No   Sexual activity: Never  Other Topics Concern   Not on file  Social History Narrative   Not on file   Social Determinants of Health   Financial Resource Strain: Medium Risk (11/09/2021)   Overall Financial Resource Strain (CARDIA)    Difficulty of Paying Living Expenses: Somewhat hard  Food Insecurity: No Food Insecurity (02/14/2022)   Hunger Vital Sign    Worried About Running Out of Food in the Last Year: Never true    Ran Out of Food in the Last Year: Never true  Transportation Needs: No Transportation Needs (02/14/2022)   PRAPARE - Hydrologist (Medical): No    Lack of Transportation (Non-Medical): No  Physical Activity:  Not on file  Stress: No Stress Concern Present (02/14/2022)   [place]    Feeling of Stress : Not at all  Social Connections: Unknown (02/14/2022)   Social Connection and Isolation Panel [NHANES]    Frequency of Communication with Friends and Family: More than three times a week    Frequency of Social Gatherings with Friends and Family: More than three times a week    Attends Religious Services: More than 4 times per year    Active Member of [employer] or Organizations: Yes    Attends Music therapist: More than 4 times per year    Marital Status: Not on file     Review of Systems  Constitutional:  Negative for appetite change and unexpected weight change.  HENT:  Negative for congestion and sinus pressure.   Respiratory:  Negative for cough, chest tightness and shortness of breath.   Cardiovascular:  Negative for chest pain, palpitations and leg swelling.  Gastrointestinal:  Negative for abdominal pain, diarrhea, nausea and vomiting.  Genitourinary:  Negative for difficulty urinating and dysuria.  Musculoskeletal:  Negative for joint swelling and myalgias.  Neurological:  Negative for dizziness and headaches.  Psychiatric/Behavioral:  Negative for agitation and dysphoric mood.        Objective:     BP 114/70 (BP Location: Left Arm, Patient Position: Sitting, Cuff Size: Small)   Pulse 62   Temp 98 F (36.7 C) (Temporal)   Resp 15   Ht _0  (1.549 m)   Wt 132 lb (59.9 kg)   SpO2 99%   BMI 24.94 kg/m  Wt Readings from Last 3 Encounters:  06/08/22 132 lb (59.9 kg)  03/17/22 134 lb 11.2 oz (61.1 kg)  03/06/22 134 lb 11.2 oz (61.1 kg)    Physical Exam Vitals reviewed.  Constitutional:      General: He is not in acute distress.    Appearance: Normal appearance. He is well-developed.  HENT:     Head: Normocephalic and atraumatic.     Right Ear: External ear normal.     Left Ear: External ear  normal.  Eyes:     General: No scleral icterus.       Right eye: No discharge.        Left eye: No discharge.     Conjunctiva/sclera: Conjunctivae normal.  Cardiovascular:     Rate and Rhythm: Normal rate and regular rhythm.  Pulmonary:     Effort:  Pulmonary effort is normal. No respiratory distress.     Breath sounds: Normal breath sounds.  Abdominal:     General: Bowel sounds are normal.     Palpations: Abdomen is soft.     Tenderness: There is no abdominal tenderness.  Musculoskeletal:        General: No swelling or tenderness.     Cervical back: Neck supple. No tenderness.  Lymphadenopathy:     Cervical: No cervical adenopathy.  Skin:    Findings: No erythema or rash.  Neurological:     Mental Status: He is alert.  Psychiatric:        Mood and Affect: Mood normal.        Behavior: Behavior normal.      Outpatient Encounter Medications as of 06/08/2022  Medication Sig   acetaminophen (TYLENOL) 500 MG tablet Take 1-2 tablets (500-1,000 mg total) by mouth every 6 (six) hours as needed for mild pain.   blood glucose meter kit and supplies KIT Dispense based on patient and insurance preference. Use to check blood sugars twice daily. DX E11.9   celecoxib (CELEBREX) 200 MG capsule Take 1 capsule (200 mg total) by mouth 2 (two) times daily.   cholecalciferol (VITAMIN D) 1000 units tablet Take 1,000 Units by mouth daily.   Dulaglutide (TRULICITY) 3 DS/2.8JG SOPN Inject 3 mg into the skin once a week. (Patient taking differently: Inject 3 mg into the skin once a week. Mondays)   enoxaparin (LOVENOX) 40 MG/0.4ML injection Inject 0.4 mLs (40 mg total) into the skin daily.   FLUoxetine (PROZAC) 20 MG capsule Take 1 capsule (20 mg total) by mouth daily. (Patient taking differently: Take 20 mg by mouth every morning.)   fluticasone (FLONASE) 50 MCG/ACT nasal spray USE 2 SPRAYS IN BOTH  NOSTRILS DAILY   glucose blood (ONETOUCH VERIO) test strip TEST BLOOD SUGAR TWICE DAILY AS DIRECTED dx  e11.9   glucose blood test strip Use as instructed to check blood sugars to check blood sugars twice daily. Dx E11.9   levocetirizine (XYZAL) 5 MG tablet TAKE 1 TABLET BY MOUTH  DAILY   levothyroxine (SYNTHROID) 88 MCG tablet Take 1 tablet (88 mcg total) by mouth daily. (Patient taking differently: Take 88 mcg by mouth daily before breakfast.)   metFORMIN (GLUCOPHAGE-XR) 500 MG 24 hr tablet Take 1 tablet (500 mg total) by mouth 2 (two) times daily. (Patient taking differently: Take 1,000 mg by mouth daily with breakfast.)   mupirocin ointment (BACTROBAN) 2 % Apply 1 application topically 2 (two) times daily.   ondansetron (ZOFRAN) 4 MG tablet Take 1 tablet (4 mg total) by mouth every 6 (six) hours as needed for nausea.   pantoprazole (PROTONIX) 40 MG tablet Take 1 tablet (40 mg total) by mouth daily. TAKE 1 TABLET BY MOUTH  DAILY   rosuvastatin (CRESTOR) 40 MG tablet Take 1 tablet (40 mg total) by mouth daily. (Patient taking differently: Take 40 mg by mouth every morning.)   traMADol (ULTRAM) 50 MG tablet Take 1-2 tablets (50-100 mg total) by mouth every 4 (four) hours as needed for moderate pain.   traZODone (DESYREL) 50 MG tablet Take 1 tablet (50 mg total) by mouth daily. (Patient taking differently: Take 50 mg by mouth at bedtime.)   vitamin B-12 (CYANOCOBALAMIN) 1000 MCG tablet Take 1,000 mcg by mouth daily.   [DISCONTINUED] benzonatate (TESSALON) 100 MG capsule Take 1 capsule (100 mg total) by mouth 2 (two) times daily as needed for cough. (Patient not taking: Reported on  03/01/2022)   No facility-administered encounter medications on file as of 06/08/2022.     Lab Results  Component Value Date   WBC 8.0 03/06/2022   HGB 13.1 03/06/2022   HCT 41.4 03/06/2022   PLT 359 03/06/2022   GLUCOSE 171 (H) 06/05/2022   CHOL 125 06/05/2022   TRIG 101.0 06/05/2022   HDL 46.20 06/05/2022   LDLDIRECT 188.8 06/30/2013   LDLCALC 59 06/05/2022   ALT 7 06/05/2022   AST 9 06/05/2022   NA 138  06/05/2022   K 4.6 06/05/2022   CL 104 06/05/2022   CREATININE 0.79 06/05/2022   BUN 15 06/05/2022   CO2 24 06/05/2022   TSH 3.46 07/08/2021   HGBA1C 7.7 (H) 06/05/2022   MICROALBUR 1.2 07/08/2021    DG Knee Right Port  Result Date: 03/17/2022 CLINICAL DATA:  Status post total knee arthroplasty EXAM: PORTABLE RIGHT KNEE - 1-2 VIEW COMPARISON:  None Available. FINDINGS: Status post right knee total arthroplasty with expected overlying postoperative change. No perihardware fracture malposition. IMPRESSION: Status post right knee total arthroplasty with expected overlying postoperative change. No perihardware fracture malposition. Electronically Signed   By: Delanna Ahmadi M.D.   On: 03/17/2022 11:38       Assessment & Plan:   Problem List Items Addressed This Visit     Anemia    With IDA.  On iron.  Have discussed GI evaluation.  cologuard 02/2019- negative.  Has declined any further GI w/up.       Relevant Orders   CBC with Differential/Platelet   IBC + Ferritin   Aortic atherosclerosis ([place])    Continue crestor.       Carotid artery disease ([place])    Previous left carotid 60-79% and right 40-50%.  Continue crestor.  Needs f/u with [facility] to monitor. Discussed again with her today.  She agrees to a carotid ultrasound, but wants to hold on f/u with [facility].  Wants me to schedule carotid ultrasound.  Continue risk factor modification.  Continue crestor.       Coronary artery disease of native artery of native heart with stable angina pectoris [facility])    Previously seen on chest CT.  Saw Dr Rockey Situ.  No chest pain.  Continue risk factor modification.  Continue crestor.       GERD (gastroesophageal reflux disease)    Continue protonix.  No upper symptoms reported.       Hypercholesterolemia    Continue crestor.  Low cholesterol diet and exercise.  Follow lipid panel and liver function tests.       Relevant Orders   Hepatic function panel   Lipid panel   Hypothyroidism    On  thyroid replacement.  Follow tsh.       Relevant Orders   TSH   Lung nodule, multiple    CT chest 08/2021 - Stable small pulmonary nodules, the largest 6 mm in the left upper lobe. These are compatible with benign nodules. No new or enlarging pulmonary nodules.      Major depressive disorder, single episode, mild (HCC)    Overall appears to be doing well and handling stress.  Continue prozac.  Follow.        Total knee replacement status    S/p total knee arthroplasty.  Doing well.  Followed by ortho.       Type 2 diabetes mellitus with hyperglycemia (HCC) - Primary    Continues on trulicity and metformin.   Recent a1c 7.7.  Discussed adjusting medication.  She desires to work on diet and exercise before changing medication.  Discussed low carb diet and exercise as tolerated.  Check and record sugars.  Send in readings over the next few weeks.  Follow met b and a1c. Take medication as prescribed.       Relevant Orders   Urine Microalbumin w/creat. ratio   Basic metabolic panel   Hemoglobin A1c     Einar Pheasant, MD

## 2022-06-18 ENCOUNTER — Encounter: Payer: Self-pay | Admitting: Internal Medicine

## 2022-06-18 DIAGNOSIS — F320 Major depressive disorder, single episode, mild: Secondary | ICD-10-CM | POA: Insufficient documentation

## 2022-06-18 NOTE — Assessment & Plan Note (Signed)
CT chest 08/2021 - Stable small pulmonary nodules, the largest 6 mm in the left upper lobe. These are compatible with benign nodules. No new or enlarging pulmonary nodules.

## 2022-06-18 NOTE — Assessment & Plan Note (Signed)
Previous left carotid 60-79% and right 40-50%.  Continue crestor.  Needs f/u with [facility] to monitor. Discussed again with her today.  She agrees to a carotid ultrasound, but wants to hold on f/u with [facility].  Wants me to schedule carotid ultrasound.  Continue risk factor modification.  Continue crestor.

## 2022-06-18 NOTE — Assessment & Plan Note (Signed)
Overall appears to be doing well and handling stress.  Continue prozac.  Follow.   

## 2022-06-18 NOTE — Assessment & Plan Note (Signed)
With IDA.  On iron.  Have discussed GI evaluation.  cologuard 02/2019- negative.  Has declined any further GI w/up.  

## 2022-06-18 NOTE — Assessment & Plan Note (Signed)
On thyroid replacement.  Follow tsh.  

## 2022-06-18 NOTE — Assessment & Plan Note (Signed)
Continue crestor.  Low cholesterol diet and exercise. Follow lipid panel and liver function tests.   

## 2022-06-18 NOTE — Assessment & Plan Note (Signed)
S/p total knee arthroplasty.  Doing well.  Followed by ortho.

## 2022-06-18 NOTE — Assessment & Plan Note (Signed)
Previously seen on chest CT.  Saw Dr Gollan.  No chest pain.  Continue risk factor modification.  Continue crestor.  

## 2022-06-18 NOTE — Assessment & Plan Note (Signed)
Continue crestor 

## 2022-06-18 NOTE — Assessment & Plan Note (Signed)
Continue protonix.  No upper symptoms reported.  

## 2022-06-18 NOTE — Assessment & Plan Note (Signed)
Continues on trulicity and metformin.   Recent a1c 7.7.  Discussed adjusting medication.  She desires to work on diet and exercise before changing medication.  Discussed low carb diet and exercise as tolerated.  Check and record sugars.  Send in readings over the next few weeks.  Follow met b and a1c. Take medication as prescribed.

## 2022-06-19 ENCOUNTER — Other Ambulatory Visit: Payer: Medicare Other

## 2022-07-06 ENCOUNTER — Telehealth: Payer: Self-pay

## 2022-07-06 NOTE — Telephone Encounter (Signed)
I calked patient and she confirmed that she does not use [employer] supplies for her testing needs. Pt uses [employer] I have shredded paper.

## 2022-07-06 NOTE — Telephone Encounter (Signed)
Patient states she is returning our call.  I transferred her call to Cheri Rous, [place].

## 2022-07-12 ENCOUNTER — Telehealth: Payer: Self-pay

## 2022-07-12 NOTE — Telephone Encounter (Signed)
I ordered cologuard per patient request.  Joud Pettinato,cma

## 2022-07-16 ENCOUNTER — Other Ambulatory Visit: Payer: Self-pay | Admitting: Internal Medicine

## 2022-07-16 DIAGNOSIS — Z760 Encounter for issue of repeat prescription: Secondary | ICD-10-CM

## 2022-07-26 ENCOUNTER — Encounter: Payer: Self-pay | Admitting: Internal Medicine

## 2022-07-26 NOTE — Progress Notes (Signed)
Opened in error

## 2022-07-27 ENCOUNTER — Ambulatory Visit: Payer: Medicare Other | Admitting: Internal Medicine

## 2022-07-31 ENCOUNTER — Other Ambulatory Visit: Payer: Self-pay

## 2022-07-31 DIAGNOSIS — Z1211 Encounter for screening for malignant neoplasm of colon: Secondary | ICD-10-CM

## 2022-08-02 ENCOUNTER — Telehealth: Payer: Self-pay

## 2022-08-02 NOTE — Telephone Encounter (Signed)
I called patient & she stated that she had not requested the [employer]) form for testing supplies. Pt uses [employer].

## 2022-08-11 ENCOUNTER — Encounter
Admission: RE | Admit: 2022-08-11 | Discharge: 2022-08-11 | Disposition: A | Discharge status: Home / Self Care | Payer: Medicare Other | Source: Ambulatory Visit | Attending: Orthopedic Surgery | Admitting: Orthopedic Surgery

## 2022-08-11 ENCOUNTER — Inpatient Hospital Stay: Admission: RE | Admit: 2022-08-11 | Payer: Medicare Other | Source: Ambulatory Visit

## 2022-08-11 ENCOUNTER — Other Ambulatory Visit: Payer: Self-pay

## 2022-08-11 VITALS — BP 143/60 | Temp 98.3°F | Resp 20 | Ht 62.0 in | Wt 131.7 lb

## 2022-08-11 DIAGNOSIS — R059 Cough, unspecified: Secondary | ICD-10-CM | POA: Insufficient documentation

## 2022-08-11 DIAGNOSIS — D649 Anemia, unspecified: Secondary | ICD-10-CM | POA: Insufficient documentation

## 2022-08-11 DIAGNOSIS — Z1152 Encounter for screening for COVID-19: Secondary | ICD-10-CM | POA: Diagnosis not present

## 2022-08-11 DIAGNOSIS — M1712 Unilateral primary osteoarthritis, left knee: Secondary | ICD-10-CM | POA: Insufficient documentation

## 2022-08-11 DIAGNOSIS — Z01812 Encounter for preprocedural laboratory examination: Secondary | ICD-10-CM | POA: Diagnosis not present

## 2022-08-11 DIAGNOSIS — Z01810 Encounter for preprocedural cardiovascular examination: Secondary | ICD-10-CM | POA: Diagnosis not present

## 2022-08-11 DIAGNOSIS — E1165 Type 2 diabetes mellitus with hyperglycemia: Secondary | ICD-10-CM | POA: Diagnosis not present

## 2022-08-11 DIAGNOSIS — E119 Type 2 diabetes mellitus without complications: Secondary | ICD-10-CM

## 2022-08-11 DIAGNOSIS — Z96652 Presence of left artificial knee joint: Secondary | ICD-10-CM | POA: Insufficient documentation

## 2022-08-11 LAB — SEDIMENTATION RATE: Sed Rate: 32 mm/hr — ABNORMAL HIGH (ref 0–30)

## 2022-08-11 LAB — COMPREHENSIVE METABOLIC PANEL
ALT: 16 U/L (ref 0–44)
AST: 20 U/L (ref 15–41)
Albumin: 4.1 g/dL (ref 3.5–5.0)
Alkaline Phosphatase: 78 U/L (ref 38–126)
Anion gap: 6 (ref 5–15)
BUN: 12 mg/dL (ref 8–23)
CO2: 23 mmol/L (ref 22–32)
Calcium: 8.9 mg/dL (ref 8.9–10.3)
Chloride: 107 mmol/L (ref 98–111)
Creatinine, Ser: 0.76 mg/dL (ref 0.44–1.00)
GFR, Estimated: >60 mL/min (ref >60)
Glucose, Bld: 102 mg/dL — ABNORMAL HIGH (ref 70–99)
Potassium: 4.2 mmol/L (ref 3.5–5.1)
Sodium: 136 mmol/L (ref 135–145)
Total Bilirubin: 0.4 mg/dL (ref 0.3–1.2)
Total Protein: 7.9 g/dL (ref 6.5–8.1)

## 2022-08-11 LAB — URINALYSIS, ROUTINE W REFLEX MICROSCOPIC
Bilirubin Urine: NEGATIVE
Glucose, UA: NEGATIVE mg/dL
Hgb urine dipstick: NEGATIVE
Ketones, ur: NEGATIVE mg/dL
Nitrite: NEGATIVE
Protein, ur: NEGATIVE mg/dL
Specific Gravity, Urine: 1.017 (ref 1.005–1.030)
pH: 5 (ref 5.0–8.0)

## 2022-08-11 LAB — CBC
HCT: 35.7 % — ABNORMAL LOW (ref 36.0–46.0)
Hemoglobin: 11.1 g/dL — ABNORMAL LOW (ref 12.0–15.0)
MCH: 24.3 pg — ABNORMAL LOW (ref 26.0–34.0)
MCHC: 31.1 g/dL (ref 30.0–36.0)
MCV: 78.3 fL — ABNORMAL LOW (ref 80.0–100.0)
Platelets: 381 10*3/uL (ref 150–400)
RBC: 4.56 MIL/uL (ref 3.87–5.11)
RDW: 17.4 % — ABNORMAL HIGH (ref 11.5–15.5)
WBC: 7.6 10*3/uL (ref 4.0–10.5)
nRBC: 0 % (ref 0.0–0.2)

## 2022-08-11 LAB — SURGICAL PCR SCREEN
MRSA, PCR: NEGATIVE
Staphylococcus aureus: NEGATIVE

## 2022-08-11 LAB — TYPE AND SCREEN
ABO/RH(D): A POS
Antibody Screen: NEGATIVE

## 2022-08-11 LAB — HEMOGLOBIN A1C
Hgb A1c MFr Bld: 7.3 % — ABNORMAL HIGH (ref 4.8–5.6)
Mean Plasma Glucose: 162.81 mg/dL

## 2022-08-11 LAB — C-REACTIVE PROTEIN: CRP: 0.6 mg/dL (ref <1.0)

## 2022-08-11 NOTE — Patient Instructions (Addendum)
Your procedure is scheduled on: 10/18/ 2023  Report to the Registration Desk on the 1st floor of the [facility]. To find out your arrival time, please call [phone] between 1PM - 3PM on: 08/22/2022  If your arrival time is 6:00 am, do not arrive prior to that time as the [facility] entrance doors do not open until 6:00 am.  REMEMBER: Instructions that are not followed completely may result in serious medical risk, up to and including death; or upon the discretion of your surgeon and anesthesiologist your surgery may need to be rescheduled.  Do not eat food after midnight the night before surgery.  No gum chewing, lozengers or hard candies.  You may however, drink water up to 2 hours before you are scheduled to arrive for your surgery. Do not drink anything within 2 hours of your scheduled arrival time. Type 1 and Type 2 diabetics should only drink water.  In addition, your doctor has ordered for you to drink the provided   Gatorade G2 Drinking this carbohydrate drink up to two hours before surgery helps to reduce insulin resistance and improve patient outcomes. Please complete drinking 2 hours prior to scheduled arrival time.  TAKE THESE MEDICATIONS THE MORNING OF SURGERY WITH A SIP OF WATER: Celecoxib FLUoxetine  Levocetirizine Levothyroxine Tramadol Pantroprazole -(take one the night before and one on the morning of surgery - helps to prevent nausea after surgery)  HOLD metformin two days before surgery. Last dose is Aug 20, 2022  DO NOT take trulicity on Aug 21, 3473.   Follow recommendations from Cardiologist, Pulmonologist or PCP regarding stopping Aspirin.- Please call Dr. Clydell Hakim office to get recommendation about aspirin.  One week prior to surgery: Stop Anti-inflammatories (NSAIDS) such as Advil, Aleve, Ibuprofen, Motrin, Naproxen, Naprosyn and Aspirin based products such as Excedrin, Goodys Powder, BC Powder. Stop ANY OVER THE COUNTER supplements until after  surgery. You may however, continue to take Tylenol if needed for pain up until the day of surgery.  No Alcohol for 24 hours before or after surgery.  No Smoking including e-cigarettes for 24 hours prior to surgery.  No chewable tobacco products for at least 6 hours prior to surgery.  No nicotine patches on the day of surgery.  Do not use any "recreational" drugs for at least a week prior to your surgery.  Please be advised that the combination of cocaine and anesthesia may have negative outcomes, up to and including death. If you test positive for cocaine, your surgery will be cancelled.  On the morning of surgery brush your teeth with toothpaste and water, you may rinse your mouth with mouthwash if you wish. Do not swallow any toothpaste or mouthwash.  Use CHG Soap  as directed on instruction sheet.-provided for you   Do not wear jewelry, make-up, hairpins, clips or nail polish.  Do not wear lotions, powders, or perfumes.   Do not shave body from the neck down 48 hours prior to surgery just in case you cut yourself which could leave a site for infection.  Also, freshly shaved skin may become irritated if using the CHG soap.  Contact lenses, hearing aids and dentures may not be worn into surgery.  Do not bring valuables to the hospital. [facility] is not responsible for any missing/lost belongings or valuables.   Notify your doctor if there is any change in your medical condition (cold, fever, infection).  Wear comfortable clothing (specific to your surgery type) to the hospital.  After surgery,  you can help prevent lung complications by doing breathing exercises.  Take deep breaths and cough every 1-2 hours. Your doctor may order a device called an Incentive Spirometer to help you take deep breaths. .  If you are being admitted to the hospital overnight, leave your suitcase in the car. After surgery it may be brought to your room.  If you are being discharged the day of  surgery, you will not be allowed to drive home. You will need a responsible adult (18 years or older) to drive you home and stay with you that night.   If you are taking public transportation, you will need to have a responsible adult (18 years or older) with you. Please confirm with your physician that it is acceptable to use public transportation.   Please call the [facility] Dept. at [phone] if you have any questions about these instructions.  Surgery Visitation Policy:  Patients undergoing a surgery or procedure may have two family members or support persons with them as long as the person is not COVID-19 positive or experiencing its symptoms.   Inpatient Visitation:    Visiting hours are 7 a.m. to 8 p.m. Up to four visitors are allowed at one time in a patient room, including children. The visitors may rotate out with other people during the day. One designated support person (adult) may remain overnight.

## 2022-08-11 NOTE — Pre-Procedure Instructions (Signed)
Patient came in to PRE admit testing without her medication bottles or medication list. This RN and patient reconciled medication based on patient's memory and from  her chart.

## 2022-08-11 NOTE — Progress Notes (Addendum)
  Perioperative Services Pre-Admission/Anesthesia Testing    Date: 08/11/22  Name: Jody Wang MRN:   [id number]  Re: Cough  Planned Surgical Procedure(s):    Case: [id number] Date/Time: 08/23/22 0700   Procedure: COMPUTER ASSISTED TOTAL KNEE ARTHROPLASTY  (Left: Knee)   Anesthesia type: Choice   Pre-op diagnosis: PRIMARY OSTEOARTHRITIS OF LEFT KNEE.   Location: [facility] OR ROOM 01 / [place] [facility] FOR AN[facility] GROUP   Surgeons: Dereck Leep, MD   Clinical Notes:  Patient is scheduled for the above procedure on 08/23/2022 with Dr. [name], MD. In preparation for her procedure, patient presented to the PAT clinic on the afternoon of 08/11/2022 for interview and preoperative lab testing.  During her interview, patient with significant uncontrolled cough.  Patient reported to PAT RN that this is a new symptom that started for her approximately 2 days ago. Son is accompanies patient to clinic today and he is also noted to be forcefully coughing per staff report. Patient denies any associated fevers, fatigue, myalgias, or other concerning symptoms. She has not had any known close exposures to anyone with SARS-CoV-2.  With that being said, we have been seeing increased numbers of SARS-CoV-2 in the community.  Presenting symptoms concerning for possible viral respiratory infection.  Given the patient is scheduled for elective orthopedic surgery in the near future, will add orders for patient to be tested for SARS-CoV-2 today while in clinic.  I will plan on following results.  In the event that her testing comes back (+), I will plan on contacting patient and we can to discuss symptomatic care and reinforce need for care escalation should symptoms worsen.  We will send copy of note to primary attending surgeon to make him aware.  Encounter Diagnoses:  Encounter for preoperative screening laboratory testing for COVID-19 virus  Pre-procedural laboratory examinations Cough  ADDENDUM:  08/12/2022 at 1625 PM: Results from SARS-CoV-2 (novel coronavirus) testing negative. Potentially could reflect her being tested too early, as testing occurred on approximately day 2-3 of her new onset cough. Again, son also noted to be coughing in clinic. Patient scheduled to see Derek Mound on 08/15/2022 for preoperative visit. Patient can be reassessed at that time for continuing cough. At this point, I do not anticipate that this will have any bearing on her surgery date, but then again, it will depend on her clinical presentation when seen in the office. Note sent to Dr. Marry Guan to make him aware.   Honor Loh, MSN, APRN, FNP-C, CEN [facility]  Peri-operative Services Nurse Practitioner Phone: [phone] 08/11/22 3:43 PM  NOTE: This note has been prepared using Dragon dictation software. Despite my best ability to proofread, there is always the potential that unintentional transcriptional errors may still occur from this process.

## 2022-08-12 ENCOUNTER — Encounter: Payer: Self-pay | Admitting: Internal Medicine

## 2022-08-12 LAB — SARS CORONAVIRUS 2 (TAT 6-24 HRS): SARS Coronavirus 2: NEGATIVE

## 2022-08-13 NOTE — Discharge Instructions (Addendum)
Instructions after Total Knee Replacement   James P. Holley Bouche., M.D.     Dept. of [facility]  [place], [place]  [address]  Phone: [phone]   Fax: [phone]    DIET: Drink plenty of non-alcoholic fluids. Resume your normal diet. Include foods high in fiber.  ACTIVITY:  You may use crutches or a walker with weight-bearing as tolerated, unless instructed otherwise. You may be weaned off of the walker or crutches by your Physical Therapist.  Do NOT place pillows under the knee. Anything placed under the knee could limit your ability to straighten the knee.   Continue doing gentle exercises. Exercising will reduce the pain and swelling, increase motion, and prevent muscle weakness.   Please continue to use the TED compression stockings for 6 weeks. You may remove the stockings at night, but should reapply them in the morning. Do not drive or operate any equipment until instructed.  WOUND CARE:  Continue to use the PolarCare or ice packs periodically to reduce pain and swelling. You may bathe or shower after the staples are removed at the first office visit following surgery.  MEDICATIONS: You may resume your regular medications. Please take the pain medication as prescribed on the medication. Do not take pain medication on an empty stomach. You have been given a prescription for a blood thinner (Lovenox or Coumadin). Please take the medication as instructed. (NOTE: After completing a 2 week course of Lovenox, take one Enteric-coated aspirin once a day. This along with elevation will help reduce the possibility of phlebitis in your operated leg.) Do not drive or drink alcoholic beverages when taking pain medications.  CALL THE OFFICE FOR: Temperature above 101 degrees Excessive bleeding or drainage on the dressing. Excessive swelling, coldness, or paleness of the toes. Persistent nausea and vomiting.  FOLLOW-UP:  You  should have an appointment to return to the office in 10-14 days after surgery. Arrangements have been made for continuation of Physical Therapy (either home therapy or outpatient therapy).     [facility] Department Directory         [email or web address]       [email or web address]          Cardiology  Appointments: [place] [phone]  Endocrinology  Appointments: [place] [phone] [place] [phone]  Gastroenterology  Appointments: [place] [phone] [place] [phone]        General Surgery   Appointments: [facility]  Internal Medicine/Family Medicine  Appointments: [facility] - [phone] [place] [phone]  Metabolic and [place] Loss Surgery  Appointments: [facility]        Neurology  Appointments: [place] [phone] [place] - [phone]  Neurosurgery  Appointments: [place]  Obstetrics & Gynecology  Appointments: [place] [phone] [place] - [phone]        Pediatrics  Appointments: [place] [phone] [place] - [phone]  Physiatry  Appointments: [place] [phone]  Physical Therapy  Appointments: [place] [phone]        Podiatry  Appointments: [place] [phone] [place] - [phone]  Pulmonology  Appointments: [place]  Rheumatology  Appointments: [place] [phone]        [place] Location: [facility]  [place], [place]  [address]  [place] Location: [facility] [address] [place], [place]  [address]  [place] Location: [facility] [address] [place], [place]  [address]  POLAR CARE INFORMATION  [email or web address]  How to use [place] Cold Therapy System?  YouTube   [email or web address]  OPERATING  INSTRUCTIONS  Start the product With dry hands, connect the transformer to the electrical connection located on the top of the cooler. Next, plug the transformer into an appropriate electrical outlet. The unit will automatically start running at this point.  To stop the pump, disconnect electrical power.  Unplug to stop the product when not in use. Unplugging the Polar Care unit turns it off. Always unplug immediately after use. Never leave it plugged in while unattended. Remove pad.    FIRST ADD WATER TO FILL LINE, THEN ICE---Replace ice when existing ice is almost melted  1 Discuss Treatment with your [place] Practitioner and Use Only as Prescribed 2 Apply Insulation Barrier & Cold Therapy Pad 3 Check for Moisture 4 Inspect Skin Regularly  Tips and Trouble Shooting Usage Tips 1. Use cubed or chunked ice for optimal performance. 2. It is recommended to drain the Pad between uses. To drain the pad, hold the Pad upright with the hose pointed toward the ground. Depress the black plunger and allow water to drain out. 3. You may disconnect the Pad from the unit without removing the pad from the affected area by depressing the silver tabs on the hose coupling and gently pulling the hoses apart. The Pad and unit will seal itself and will not leak. Note: Some dripping during release is normal. 4. DO NOT RUN PUMP WITHOUT WATER! The pump in this unit is designed to run with water. Running the unit without water will cause permanent damage to the pump. 5. Unplug unit before removing lid.  TROUBLESHOOTING GUIDE Pump not running, Water not flowing to the pad, Pad is not getting cold 1. Make sure the transformer is plugged into the wall outlet. 2. Confirm that the ice and water are filled to the indicated levels. 3. Make sure there are no kinks in the pad. 4. Gently pull on the blue tube to make sure the tube/pad junction is straight. 5. Remove the pad from the treatment site and ll it  while the pad is lying at; then reapply. 6. Confirm that the pad couplings are securely attached to the unit. Listen for the double clicks (Figure 1) to confirm the pad couplings are securely attached.  Leaks    Note: Some condensation on the lines, controller, and pads is unavoidable, especially in warmer climates. 1. If using a Breg Polar Care Cold Therapy unit with a detachable Cold Therapy Pad, and a leak exists (other than condensation on the lines) disconnect the pad couplings. Make sure the silver tabs on the couplings are depressed before reconnecting the pad to the pump hose; then confirm both sides of the coupling are properly clicked in. 2. If the coupling continues to leak or a leak is detected in the pad itself, stop using it and call [place] at [phone].  Cleaning After use, empty and dry the unit with a soft cloth. Warm water and mild detergent may be used occasionally to clean the pump and tubes.  WARNING: The [place] can be cold enough to cause serious injury, including full skin necrosis. Follow these Operating Instructions, and carefully read the Product Insert (see pouch on side of unit) and the Cold Therapy Pad Fitting Instructions (provided with each Cold Therapy Pad) prior to use.   Information for Discharge Teaching: DO NOT [place]  4 DAYS (96 hours) 08/27/22 EXPAREL (bupivacaine liposome injectable suspension)   Your surgeon or anesthesiologist gave you EXPAREL(bupivacaine) to help control your pain after surgery.  EXPAREL is a local anesthetic that provides pain relief by numbing the tissue around the surgical site. EXPAREL is designed to release pain medication over time and can control pain for up to 72 hours. Depending on how you respond to EXPAREL, you may require less pain medication during your recovery.  Possible side effects: Temporary loss of sensation or ability to move in the area where bupivacaine was  injected. Nausea, vomiting, constipation Rarely, numbness and tingling in your mouth or lips, lightheadedness, or anxiety may occur. Call your doctor right away if you think you may be experiencing any of these sensations, or if you have other questions regarding possible side effects.  Follow all other discharge instructions given to you by your surgeon or nurse. Eat a healthy diet and drink plenty of water or other fluids.  If you return to the hospital for any reason within 96 hours following the administration of EXPAREL, it is important for health care providers to know that you have received this anesthetic. A teal colored band has been placed on your arm with the date, time and amount of EXPAREL you have received in order to alert and inform your health care providers. Please leave this armband in place for the full 96 hours following administration, and then you may remove the band.

## 2022-08-14 DIAGNOSIS — Z1211 Encounter for screening for malignant neoplasm of colon: Secondary | ICD-10-CM | POA: Diagnosis not present

## 2022-08-14 LAB — COLOGUARD: Cologuard: NEGATIVE

## 2022-08-15 ENCOUNTER — Telehealth: Payer: Self-pay | Admitting: Internal Medicine

## 2022-08-15 DIAGNOSIS — M1712 Unilateral primary osteoarthritis, left knee: Secondary | ICD-10-CM | POA: Diagnosis not present

## 2022-08-15 NOTE — Telephone Encounter (Signed)
Patient states she is following up on her call from earlier today.  Patient states Dr. Einar Pheasant can call the following number to request patient receive food following her surgery:  [phone].  Patient requests that we please call her when we submit the request.  Patient states if she does not answer the phone when we call, we may leave a detailed message at [phone].

## 2022-08-15 NOTE — Telephone Encounter (Signed)
Pt called stating she is having surgery soon and she called her insurance company and they stated she can have food delivered to her but her doctor has to request it.

## 2022-08-16 NOTE — Telephone Encounter (Signed)
I was able to call [facility] patient;s insurance to find out what needed be done for patient to receive meals. I also spoke with [employer] the 3rd party company that provides the meals for her. Advocate there explained that patient needed to call [facility] when she is discharged after knee surgery so that [employer] can receive referral so they can get meals out to patient. [employer] bills or files claim through [facility] so they get their reimbursement. This is part of patient's insurance plan. I made sure there was no referral needed on our end of things before ending phone call. I did call patient & explain this to her & she seemed to understand the process. She does have number that she needs to reach [facility] at bc she gave it to [place] yesterday & said she has written done at her home. Will call back if she cannot find.

## 2022-08-16 NOTE — Telephone Encounter (Signed)
Patient returned your call . She stated Type of Surgery  1. Left Knee replacement ,[facility] , Dr. Marry Guan on 08/23/22 2. Send Meals : [address] , [place] , [place] [address]

## 2022-08-16 NOTE — Telephone Encounter (Signed)
Left message for patient to return call to office. Need to know. 1 type of surgery and date. 2 where she would like order sent for meal delivery.

## 2022-08-20 NOTE — H&P (Signed)
ORTHOPAEDIC HISTORY & PHYSICAL Gwenlyn Fudge, [place] - 08/15/2022 10:45 AM EDT Formatting of this note is different from the original. [place] MEDICINE Chief Complaint:   Chief Complaint  Patient presents with  Knee Pain  H & P LEFT KNEE   History of Present Illness:   Cala Kruckenberg is a 76 y.o. female that presents to clinic today for her preoperative history and evaluation. Patient presents with her son. The patient is scheduled to undergo a left total knee arthroplasty on 08/23/22 by Dr. Marry Guan. Her pain began several years ago. The pain is located primarily along the medial aspect of the knee. She describes her pain as worse with weightbearing. She reports associated swelling with some giving way of the knee. She denies associated numbness or tingling, denies locking.   The patient's symptoms have progressed to the point that they decrease her quality of life. The patient has previously undergone conservative treatment including NSAIDS and injections to the knee without adequate control of her symptoms.  Denies history of lumbar surgery, denies history of DVT. Not followed by cardiology.  Patient is a type II diabetic. Last A1c 7.3 on 08/11/2022.  Patient will have her son and assorted family to help out post-operatively.   Past Medical, Surgical, Family, Social History, Allergies, Medications:   Past Medical History:  Past Medical History:  Diagnosis Date  Depression  Diabetes mellitus without complication (CMS-HCC)  Gastroesophageal reflux disease  Hypercholesterolemia  Hypothyroidism  Murmur, heart  slight- followed by pcp  Wears dentures  partial- lower   Past Surgical History:  Past Surgical History:  Procedure Laterality Date  ABDOMINAL HYSTERECTOMY 1985  Cataract extraction with phaco Bilateral 02/16/2016  Right 02/16/16, Left 03/15/16  Right total knee arthroplasty using computer-assisted navigation 03/17/2022  Dr  Marry Guan  Breast cyst excision Bilateral  Inner ear surgery   Current Medications:  Current Outpatient Medications  Medication Sig Dispense Refill  acetaminophen (TYLENOL) 500 MG tablet Take 1,000 mg by mouth every 3 (three) hours as needed for Pain  amoxicillin (AMOXIL) 500 MG capsule Take 4 capsules 1 hour before the dental procedure 4 capsule 1  aspirin 81 MG EC tablet Take 81 mg by mouth once daily  cholecalciferol (VITAMIN D3) 1000 unit tablet Take 1,000 Units by mouth once daily  cyanocobalamin (VITAMIN B12) 1000 MCG tablet Take 1,000 mcg by mouth once daily  diclofenac (VOLTAREN) 50 MG EC tablet Take 50 mg by mouth 2 (two) times daily with meals  dulaglutide (TRULICITY) 3 IW/5.8 mL subcutaneous pen injector Inject subcutaneously once a week  FLUoxetine (PROZAC) 20 MG capsule TAKE 1 CAPSULE EVERY DAY  fluticasone propionate (FLONASE) 50 mcg/actuation nasal spray Place 2 sprays into both nostrils once daily as needed  lancing device Misc 1 each as needed  levocetirizine (XYZAL) 5 MG tablet Take 5 mg by mouth every evening.   levothyroxine (SYNTHROID, LEVOTHROID) 88 MCG tablet Take 88 mcg by mouth once daily.   metFORMIN (GLUCOPHAGE-XR) 500 MG XR tablet Take 1,000 mg by mouth once daily  ONETOUCH VERIO HIGH CONTROL Soln 1 each once daily  ONETOUCH VERIO TEST STRIPS test strip 1 strip 3 (three) times daily  pantoprazole (PROTONIX) 40 MG DR tablet TAKE 1 TABLET EVERY DAY  rosuvastatin (CRESTOR) 40 MG tablet Take 40 mg by mouth once daily  traZODone (DESYREL) 50 MG tablet Take 50 mg by mouth at bedtime  ULTRA THIN LANCETS Use 1 each 3 (three) times daily   No current facility-administered  medications for this visit.   Allergies:  Allergies  Allergen Reactions  Codeine Shortness Of Breath and Other (See Comments)  stops breathing  Meperidine Shortness Of Breath  Stops breathing  Adhesive Tape-Silicones Other (See Comments)  Oxycodone Other (See Comments)  Adhesive Rash  Paper  tape ok  Empagliflozin Other (See Comments)  Yeast infections  Ezetimibe Muscle Pain and Other (See Comments)   Social History:  Social History   Socioeconomic History  Marital status: Widowed  Number of children: 3  Years of education: 70  Occupational History  Occupation: Retired-Bank Estate agent  Tobacco Use  Smoking status: Former  Packs/day: 1.00  Years: 25.00  Additional pack years: 0.00  Total pack years: 25.00  Types: Cigarettes  Quit date: 11/2013  Years since quitting: 8.7  Smokeless tobacco: Never  Vaping Use  Vaping Use: Never used  Substance and Sexual Activity  Alcohol use: Yes  Alcohol/week: 2.0 standard drinks of alcohol  Types: 2 Standard drinks or equivalent per week  Comment: 2 Margaritas a week  Drug use: No  Sexual activity: Defer  Partners: Male   Family History:  Family History  Problem Relation Age of Onset  Arthritis Mother  Diabetes Father  Coronary Artery Disease (Blocked arteries around heart) Father  High blood pressure (Hypertension) Father  Hyperlipidemia (Elevated cholesterol) Father  High blood pressure (Hypertension) Sister  Glaucoma Sister  Diabetes Sister  High blood pressure (Hypertension) Brother   Review of Systems:   A 10+ ROS was performed, reviewed, and the pertinent orthopaedic findings are documented in the HPI.   Physical Examination:   BP 120/70 (BP Location: Left upper arm, Patient Position: Sitting, BP Cuff Size: Adult)  Ht 157.5 cm ('5\' 2"'$ )  Wt 59.7 kg (131 lb 9.6 oz)  BMI 24.07 kg/m   Patient is a well-developed, well-nourished female in no acute distress. Patient has normal mood and affect. Patient is alert and oriented to person, place, and time.   HEENT: Atraumatic, normocephalic. Pupils equal and reactive to light. Extraocular motion intact. Noninjected sclera.  Cardiovascular: Regular rate and rhythm, with no murmurs, rubs, or gallops. Distal pulses palpable.  Respiratory: Lungs clear to auscultation  bilaterally.   Left Knee: Soft tissue swelling: mild Effusion: none Erythema: none Crepitance: mild Tenderness: medial Alignment: relative varus Mediolateral laxity: medial pseudolaxity Posterior sag: negative Patellar tracking: Good tracking without evidence of subluxation or tilt Atrophy: No significant atrophy.  Quadriceps tone was fair to good. Range of motion: 0/8/112 degrees   Able to plantarflex and dorsiflex the left ankle. Able to flex and extend the toes.  Sensation intact over the saphenous, lateral sural cutaneous, superficial fibular, and deep fibular nerve distributions.  Tests Performed/Reviewed:  X-rays  X-ray knee left 3 views  Result Date: 08/15/2022 3 views of the left knee were obtained. Images reveal severe loss of medial compartment joint space with osteophyte formation. Large osteophytes noted posteriorly. No fractures or dislocations. No other osseous abnormality noted  I personally ordered and interpreted today's x-rays.  Impression:   ICD-10-CM  1. Primary osteoarthritis of left knee M17.12   Plan:   The patient has end-stage degenerative changes of the left knee. It was explained to the patient that the condition is progressive in nature. Having failed conservative treatment, the patient has elected to proceed with a total joint arthroplasty. The patient will undergo a total joint arthroplasty with Dr. Marry Guan. The risks of surgery, including blood clot and infection, were discussed with the patient. Measures to reduce these risks,  including the use of anticoagulation, perioperative antibiotics, and early ambulation were discussed. The importance of postoperative physical therapy was discussed with the patient. The patient elects to proceed with surgery. The patient is instructed to stop all blood thinners prior to surgery. The patient is instructed to call the hospital the day before surgery to learn of the proper arrival time.   Contact our office  with any questions or concerns. Follow up as indicated, or sooner should any new problems arise, if conditions worsen, or if they are otherwise concerned.   Gwenlyn Fudge, [place] [facility] [place], [place] [address] Phone: [phone]  This note was generated in part with voice recognition software and I apologize for any typographical errors that were not detected and corrected.  Electronically signed by Gwenlyn Fudge, PA at 08/15/2022 6:10 PM EDT

## 2022-08-21 ENCOUNTER — Other Ambulatory Visit: Payer: Self-pay | Admitting: Internal Medicine

## 2022-08-21 DIAGNOSIS — E1165 Type 2 diabetes mellitus with hyperglycemia: Secondary | ICD-10-CM

## 2022-08-22 LAB — COLOGUARD: COLOGUARD: NEGATIVE

## 2022-08-22 LAB — EXTERNAL GENERIC LAB PROCEDURE: COLOGUARD: NEGATIVE

## 2022-08-23 ENCOUNTER — Observation Stay: Payer: Medicare Other

## 2022-08-23 ENCOUNTER — Other Ambulatory Visit: Payer: Self-pay

## 2022-08-23 ENCOUNTER — Encounter: Payer: Self-pay | Admitting: Orthopedic Surgery

## 2022-08-23 ENCOUNTER — Observation Stay
Admission: RE | Admit: 2022-08-23 | Discharge: 2022-08-24 | Disposition: A | Discharge status: Home / Self Care | Payer: Medicare Other | Attending: Orthopedic Surgery | Admitting: Orthopedic Surgery

## 2022-08-23 ENCOUNTER — Ambulatory Visit: Payer: Medicare Other | Admitting: Urgent Care

## 2022-08-23 ENCOUNTER — Encounter
Admission: RE | Disposition: A | Discharge status: Home / Self Care | Payer: Self-pay | Source: Home / Self Care | Attending: Orthopedic Surgery

## 2022-08-23 ENCOUNTER — Ambulatory Visit: Payer: Medicare Other | Admitting: Anesthesiology

## 2022-08-23 DIAGNOSIS — Z86718 Personal history of other venous thrombosis and embolism: Secondary | ICD-10-CM | POA: Diagnosis not present

## 2022-08-23 DIAGNOSIS — E039 Hypothyroidism, unspecified: Secondary | ICD-10-CM | POA: Diagnosis not present

## 2022-08-23 DIAGNOSIS — E119 Type 2 diabetes mellitus without complications: Secondary | ICD-10-CM | POA: Diagnosis not present

## 2022-08-23 DIAGNOSIS — M1712 Unilateral primary osteoarthritis, left knee: Secondary | ICD-10-CM | POA: Diagnosis not present

## 2022-08-23 DIAGNOSIS — I2510 Atherosclerotic heart disease of native coronary artery without angina pectoris: Secondary | ICD-10-CM | POA: Diagnosis not present

## 2022-08-23 DIAGNOSIS — E1165 Type 2 diabetes mellitus with hyperglycemia: Secondary | ICD-10-CM

## 2022-08-23 DIAGNOSIS — Z96659 Presence of unspecified artificial knee joint: Secondary | ICD-10-CM

## 2022-08-23 DIAGNOSIS — Z7982 Long term (current) use of aspirin: Secondary | ICD-10-CM | POA: Insufficient documentation

## 2022-08-23 DIAGNOSIS — Z7985 Long-term (current) use of injectable non-insulin antidiabetic drugs: Secondary | ICD-10-CM | POA: Insufficient documentation

## 2022-08-23 DIAGNOSIS — Z7984 Long term (current) use of oral hypoglycemic drugs: Secondary | ICD-10-CM | POA: Diagnosis not present

## 2022-08-23 DIAGNOSIS — Z87891 Personal history of nicotine dependence: Secondary | ICD-10-CM | POA: Diagnosis not present

## 2022-08-23 DIAGNOSIS — D649 Anemia, unspecified: Secondary | ICD-10-CM

## 2022-08-23 DIAGNOSIS — Z96651 Presence of right artificial knee joint: Secondary | ICD-10-CM | POA: Diagnosis not present

## 2022-08-23 DIAGNOSIS — Z96652 Presence of left artificial knee joint: Secondary | ICD-10-CM | POA: Diagnosis not present

## 2022-08-23 DIAGNOSIS — Z79899 Other long term (current) drug therapy: Secondary | ICD-10-CM | POA: Diagnosis not present

## 2022-08-23 DIAGNOSIS — Z471 Aftercare following joint replacement surgery: Secondary | ICD-10-CM | POA: Diagnosis not present

## 2022-08-23 DIAGNOSIS — F32A Depression, unspecified: Secondary | ICD-10-CM | POA: Diagnosis not present

## 2022-08-23 HISTORY — PX: KNEE ARTHROPLASTY: SHX992

## 2022-08-23 LAB — GLUCOSE, CAPILLARY
Glucose-Capillary: 267 mg/dL — ABNORMAL HIGH (ref 70–99)
Glucose-Capillary: 265 mg/dL — ABNORMAL HIGH (ref 70–99)
Glucose-Capillary: 332 mg/dL — ABNORMAL HIGH (ref 70–99)
Glucose-Capillary: 401 mg/dL — ABNORMAL HIGH (ref 70–99)
Glucose-Capillary: 332 mg/dL — ABNORMAL HIGH (ref 70–99)

## 2022-08-23 LAB — GLUCOSE, RANDOM: Glucose, Bld: 447 mg/dL — ABNORMAL HIGH (ref 70–99)

## 2022-08-23 SURGERY — ARTHROPLASTY, KNEE, TOTAL, USING IMAGELESS COMPUTER-ASSISTED NAVIGATION
Anesthesia: General | Site: Knee | Laterality: Left

## 2022-08-23 MED ORDER — CELECOXIB 200 MG PO CAPS: 400.0000 mg | ORAL_CAPSULE | Freq: Once | ORAL | Status: AC

## 2022-08-23 MED ORDER — GABAPENTIN 300 MG PO CAPS
ORAL_CAPSULE | ORAL | Status: AC
  Filled 2022-08-23: qty 1

## 2022-08-23 MED ORDER — OXYCODONE HCL 5 MG PO TABS: 5.0000 mg | ORAL_TABLET | Freq: Once | ORAL | Status: DC | PRN

## 2022-08-23 MED ORDER — TRAZODONE HCL 50 MG PO TABS
50.0000 mg | ORAL_TABLET | Freq: Every day | ORAL | Status: DC
  Administered 2022-08-23: 50 mg via ORAL
  Filled 2022-08-23: qty 1

## 2022-08-23 MED ORDER — VITAMIN D 25 MCG (1000 UNIT) PO TABS: 1000.0000 [IU] | ORAL_TABLET | Freq: Every day | ORAL | Status: DC

## 2022-08-23 MED ORDER — INSULIN ASPART 100 UNIT/ML IJ SOLN
5.0000 [IU] | Freq: Once | INTRAMUSCULAR | Status: DC
  Filled 2022-08-23: qty 0.05

## 2022-08-23 MED ORDER — SODIUM CHLORIDE FLUSH 0.9 % IV SOLN
INTRAVENOUS | Status: AC
  Filled 2022-08-23: qty 80

## 2022-08-23 MED ORDER — SODIUM CHLORIDE (PF) 0.9 % IJ SOLN
INTRAMUSCULAR | Status: DC | PRN
  Administered 2022-08-23: 120 mL via INTRAMUSCULAR

## 2022-08-23 MED ORDER — DEXAMETHASONE SODIUM PHOSPHATE 10 MG/ML IJ SOLN
INTRAMUSCULAR | Status: AC
  Filled 2022-08-23: qty 1

## 2022-08-23 MED ORDER — METOCLOPRAMIDE HCL 10 MG PO TABS: 10.0000 mg | ORAL_TABLET | Freq: Three times a day (TID) | ORAL | Status: DC

## 2022-08-23 MED ORDER — INSULIN ASPART 100 UNIT/ML IJ SOLN
INTRAMUSCULAR | Status: AC
  Administered 2022-08-23: 11 [IU] via SUBCUTANEOUS
  Filled 2022-08-23: qty 1

## 2022-08-23 MED ORDER — KETAMINE HCL 10 MG/ML IJ SOLN
INTRAMUSCULAR | Status: DC | PRN
  Administered 2022-08-23 (×4): 10 mg via INTRAVENOUS

## 2022-08-23 MED ORDER — ACETAMINOPHEN 10 MG/ML IV SOLN: 1000.0000 mg | Freq: Once | INTRAVENOUS | Status: DC | PRN

## 2022-08-23 MED ORDER — ROSUVASTATIN CALCIUM 20 MG PO TABS
ORAL_TABLET | ORAL | Status: AC
  Administered 2022-08-23: 40 mg via ORAL
  Filled 2022-08-23: qty 1

## 2022-08-23 MED ORDER — CEFAZOLIN SODIUM-DEXTROSE 2-4 GM/100ML-% IV SOLN: 2.0000 g | Freq: Four times a day (QID) | INTRAVENOUS | Status: DC

## 2022-08-23 MED ORDER — HYDROMORPHONE HCL 1 MG/ML IJ SOLN: 0.5000 mg | INTRAMUSCULAR | Status: DC | PRN

## 2022-08-23 MED ORDER — FENTANYL CITRATE (PF) 100 MCG/2ML IJ SOLN: 25.0000 ug | INTRAMUSCULAR | Status: DC | PRN

## 2022-08-23 MED ORDER — PANTOPRAZOLE SODIUM 40 MG PO TBEC
DELAYED_RELEASE_TABLET | ORAL | Status: AC
  Administered 2022-08-23: 40 mg via ORAL
  Filled 2022-08-23: qty 1

## 2022-08-23 MED ORDER — CELECOXIB 200 MG PO CAPS
ORAL_CAPSULE | ORAL | Status: AC
  Administered 2022-08-23: 200 mg via ORAL
  Filled 2022-08-23: qty 1

## 2022-08-23 MED ORDER — ROSUVASTATIN CALCIUM 20 MG PO TABS: 40.0000 mg | ORAL_TABLET | Freq: Every day | ORAL | Status: DC

## 2022-08-23 MED ORDER — CEFAZOLIN SODIUM-DEXTROSE 2-4 GM/100ML-% IV SOLN
INTRAVENOUS | Status: AC
  Filled 2022-08-23: qty 100

## 2022-08-23 MED ORDER — BUPIVACAINE LIPOSOME 1.3 % IJ SUSP
INTRAMUSCULAR | Status: AC
  Filled 2022-08-23: qty 40

## 2022-08-23 MED ORDER — ALUM & MAG HYDROXIDE-SIMETH 200-200-20 MG/5ML PO SUSP: 30.0000 mL | ORAL | Status: DC | PRN

## 2022-08-23 MED ORDER — ACETAMINOPHEN 325 MG PO TABS: 325.0000 mg | ORAL_TABLET | Freq: Four times a day (QID) | ORAL | Status: DC | PRN

## 2022-08-23 MED ORDER — OXYCODONE HCL 5 MG PO TABS: 10.0000 mg | ORAL_TABLET | ORAL | Status: DC | PRN

## 2022-08-23 MED ORDER — INSULIN ASPART 100 UNIT/ML IJ SOLN
INTRAMUSCULAR | Status: AC
  Filled 2022-08-23: qty 1

## 2022-08-23 MED ORDER — ACETAMINOPHEN 10 MG/ML IV SOLN
INTRAVENOUS | Status: AC
  Filled 2022-08-23: qty 100

## 2022-08-23 MED ORDER — PHENOL 1.4 % MT LIQD: 1.0000 | OROMUCOSAL | Status: DC | PRN

## 2022-08-23 MED ORDER — BUPIVACAINE HCL (PF) 0.5 % IJ SOLN
INTRAMUSCULAR | Status: DC | PRN
  Administered 2022-08-23: 3 mL via INTRATHECAL

## 2022-08-23 MED ORDER — KETAMINE HCL 50 MG/5ML IJ SOSY
PREFILLED_SYRINGE | INTRAMUSCULAR | Status: AC
  Filled 2022-08-23: qty 5

## 2022-08-23 MED ORDER — EPHEDRINE SULFATE (PRESSORS) 50 MG/ML IJ SOLN
INTRAMUSCULAR | Status: DC | PRN
  Administered 2022-08-23 (×2): 5 mg via INTRAVENOUS

## 2022-08-23 MED ORDER — ONDANSETRON HCL 4 MG/2ML IJ SOLN: 4.0000 mg | Freq: Four times a day (QID) | INTRAMUSCULAR | Status: DC | PRN

## 2022-08-23 MED ORDER — LEVOTHYROXINE SODIUM 88 MCG PO TABS
88.0000 ug | ORAL_TABLET | Freq: Every day | ORAL | Status: DC
  Administered 2022-08-24: 88 ug via ORAL
  Filled 2022-08-23: qty 1

## 2022-08-23 MED ORDER — TRANEXAMIC ACID-NACL 1000-0.7 MG/100ML-% IV SOLN
INTRAVENOUS | Status: AC
  Filled 2022-08-23: qty 100

## 2022-08-23 MED ORDER — INSULIN ASPART 100 UNIT/ML IJ SOLN: 0.0000 [IU] | Freq: Every day | INTRAMUSCULAR | Status: DC

## 2022-08-23 MED ORDER — METOCLOPRAMIDE HCL 10 MG PO TABS
ORAL_TABLET | ORAL | Status: AC
  Administered 2022-08-23: 10 mg via ORAL
  Filled 2022-08-23: qty 1

## 2022-08-23 MED ORDER — ONDANSETRON HCL 4 MG PO TABS: 4.0000 mg | ORAL_TABLET | Freq: Four times a day (QID) | ORAL | Status: DC | PRN

## 2022-08-23 MED ORDER — PHENYLEPHRINE HCL (PRESSORS) 10 MG/ML IV SOLN
INTRAVENOUS | Status: DC | PRN
  Administered 2022-08-23: 120 ug via INTRAVENOUS
  Administered 2022-08-23 (×2): 80 ug via INTRAVENOUS

## 2022-08-23 MED ORDER — TRANEXAMIC ACID-NACL 1000-0.7 MG/100ML-% IV SOLN: 1000.0000 mg | Freq: Once | INTRAVENOUS | Status: AC

## 2022-08-23 MED ORDER — ACETAMINOPHEN 10 MG/ML IV SOLN
INTRAVENOUS | Status: DC | PRN
  Administered 2022-08-23: 1000 mg via INTRAVENOUS

## 2022-08-23 MED ORDER — BISACODYL 10 MG RE SUPP: 10.0000 mg | Freq: Every day | RECTAL | Status: DC | PRN

## 2022-08-23 MED ORDER — PROPOFOL 500 MG/50ML IV EMUL
INTRAVENOUS | Status: DC | PRN
  Administered 2022-08-23: 100 ug/kg/min via INTRAVENOUS

## 2022-08-23 MED ORDER — FLEET ENEMA 7-19 GM/118ML RE ENEM: 1.0000 | ENEMA | Freq: Once | RECTAL | Status: DC | PRN

## 2022-08-23 MED ORDER — OXYCODONE HCL 5 MG/5ML PO SOLN: 5.0000 mg | Freq: Once | ORAL | Status: DC | PRN

## 2022-08-23 MED ORDER — CEFAZOLIN SODIUM-DEXTROSE 2-4 GM/100ML-% IV SOLN
2.0000 g | INTRAVENOUS | Status: AC
  Administered 2022-08-23: 2 g via INTRAVENOUS

## 2022-08-23 MED ORDER — TRANEXAMIC ACID-NACL 1000-0.7 MG/100ML-% IV SOLN
1000.0000 mg | INTRAVENOUS | Status: AC
  Administered 2022-08-23: 1000 mg via INTRAVENOUS

## 2022-08-23 MED ORDER — FLUTICASONE PROPIONATE 50 MCG/ACT NA SUSP: 2.0000 | Freq: Every day | NASAL | Status: DC | PRN

## 2022-08-23 MED ORDER — PANTOPRAZOLE SODIUM 40 MG PO TBEC: 40.0000 mg | DELAYED_RELEASE_TABLET | Freq: Two times a day (BID) | ORAL | Status: DC

## 2022-08-23 MED ORDER — TRAMADOL HCL 50 MG PO TABS
50.0000 mg | ORAL_TABLET | ORAL | Status: DC | PRN
  Administered 2022-08-23: 50 mg via ORAL

## 2022-08-23 MED ORDER — FERROUS SULFATE 325 (65 FE) MG PO TABS: 325.0000 mg | ORAL_TABLET | Freq: Two times a day (BID) | ORAL | Status: DC

## 2022-08-23 MED ORDER — ONDANSETRON HCL 4 MG/2ML IJ SOLN: 4.0000 mg | Freq: Once | INTRAMUSCULAR | Status: DC | PRN

## 2022-08-23 MED ORDER — OXYCODONE HCL 5 MG PO TABS: 5.0000 mg | ORAL_TABLET | ORAL | Status: DC | PRN

## 2022-08-23 MED ORDER — VITAMIN D 25 MCG (1000 UNIT) PO TABS
ORAL_TABLET | ORAL | Status: AC
  Administered 2022-08-23: 1000 [IU] via ORAL
  Filled 2022-08-23: qty 1

## 2022-08-23 MED ORDER — FLUOXETINE HCL 20 MG PO CAPS: 20.0000 mg | ORAL_CAPSULE | Freq: Every day | ORAL | Status: DC

## 2022-08-23 MED ORDER — SENNOSIDES-DOCUSATE SODIUM 8.6-50 MG PO TABS: 1.0000 | ORAL_TABLET | Freq: Two times a day (BID) | ORAL | Status: DC

## 2022-08-23 MED ORDER — INSULIN ASPART 100 UNIT/ML IJ SOLN
INTRAMUSCULAR | Status: AC
  Administered 2022-08-23: 4 [IU]
  Filled 2022-08-23: qty 1

## 2022-08-23 MED ORDER — TRANEXAMIC ACID-NACL 1000-0.7 MG/100ML-% IV SOLN
INTRAVENOUS | Status: AC
  Administered 2022-08-23: 1000 mg via INTRAVENOUS
  Filled 2022-08-23: qty 100

## 2022-08-23 MED ORDER — ENOXAPARIN SODIUM 30 MG/0.3ML IJ SOSY: 30.0000 mg | PREFILLED_SYRINGE | Freq: Two times a day (BID) | INTRAMUSCULAR | Status: DC

## 2022-08-23 MED ORDER — CETIRIZINE HCL 10 MG PO TABS
5.0000 mg | ORAL_TABLET | Freq: Every day | ORAL | Status: DC
  Filled 2022-08-23: qty 1

## 2022-08-23 MED ORDER — CELECOXIB 200 MG PO CAPS
ORAL_CAPSULE | ORAL | Status: AC
  Administered 2022-08-23: 400 mg via ORAL
  Filled 2022-08-23: qty 1

## 2022-08-23 MED ORDER — PHENYLEPHRINE HCL-NACL 20-0.9 MG/250ML-% IV SOLN
INTRAVENOUS | Status: AC
  Filled 2022-08-23: qty 250

## 2022-08-23 MED ORDER — BUPIVACAINE HCL (PF) 0.25 % IJ SOLN
INTRAMUSCULAR | Status: AC
  Filled 2022-08-23: qty 120

## 2022-08-23 MED ORDER — MAGNESIUM HYDROXIDE 400 MG/5ML PO SUSP
ORAL | Status: AC
  Administered 2022-08-23: 30 mL via ORAL
  Filled 2022-08-23: qty 30

## 2022-08-23 MED ORDER — PROPOFOL 10 MG/ML IV BOLUS
INTRAVENOUS | Status: DC | PRN
  Administered 2022-08-23: 20 mg via INTRAVENOUS
  Administered 2022-08-23: 30 mg via INTRAVENOUS

## 2022-08-23 MED ORDER — SODIUM CHLORIDE 0.9 % IV SOLN: INTRAVENOUS | Status: DC

## 2022-08-23 MED ORDER — BUPIVACAINE HCL (PF) 0.5 % IJ SOLN
INTRAMUSCULAR | Status: AC
  Filled 2022-08-23: qty 10

## 2022-08-23 MED ORDER — MAGNESIUM HYDROXIDE 400 MG/5ML PO SUSP: 30.0000 mL | Freq: Every day | ORAL | Status: DC

## 2022-08-23 MED ORDER — ACETAMINOPHEN 10 MG/ML IV SOLN
1000.0000 mg | Freq: Four times a day (QID) | INTRAVENOUS | Status: AC
  Administered 2022-08-23 (×2): 1000 mg via INTRAVENOUS

## 2022-08-23 MED ORDER — FERROUS SULFATE 325 (65 FE) MG PO TABS
ORAL_TABLET | ORAL | Status: AC
  Administered 2022-08-23: 325 mg via ORAL
  Filled 2022-08-23: qty 1

## 2022-08-23 MED ORDER — SENNOSIDES-DOCUSATE SODIUM 8.6-50 MG PO TABS
ORAL_TABLET | ORAL | Status: AC
  Administered 2022-08-23: 1 via ORAL
  Filled 2022-08-23: qty 1

## 2022-08-23 MED ORDER — PROPOFOL 10 MG/ML IV BOLUS
INTRAVENOUS | Status: AC
  Filled 2022-08-23: qty 20

## 2022-08-23 MED ORDER — TRAMADOL HCL 50 MG PO TABS
ORAL_TABLET | ORAL | Status: AC
  Filled 2022-08-23: qty 1

## 2022-08-23 MED ORDER — SURGIPHOR WOUND IRRIGATION SYSTEM - OPTIME: TOPICAL | Status: DC | PRN

## 2022-08-23 MED ORDER — CEFAZOLIN SODIUM-DEXTROSE 2-4 GM/100ML-% IV SOLN
2.0000 g | Freq: Four times a day (QID) | INTRAVENOUS | Status: AC
  Administered 2022-08-23 (×2): 2 g via INTRAVENOUS

## 2022-08-23 MED ORDER — CELECOXIB 200 MG PO CAPS: 200.0000 mg | ORAL_CAPSULE | Freq: Two times a day (BID) | ORAL | Status: DC

## 2022-08-23 MED ORDER — MENTHOL 3 MG MT LOZG: 1.0000 | LOZENGE | OROMUCOSAL | Status: DC | PRN

## 2022-08-23 MED ORDER — INSULIN ASPART 100 UNIT/ML IJ SOLN: 5.0000 [IU] | Freq: Once | INTRAMUSCULAR | Status: DC

## 2022-08-23 MED ORDER — PHENYLEPHRINE 80 MCG/ML (10ML) SYRINGE FOR IV PUSH (FOR BLOOD PRESSURE SUPPORT)
PREFILLED_SYRINGE | INTRAVENOUS | Status: AC
  Filled 2022-08-23: qty 10

## 2022-08-23 MED ORDER — CHLORHEXIDINE GLUCONATE 0.12 % MT SOLN
15.0000 mL | Freq: Once | OROMUCOSAL | Status: AC
  Administered 2022-08-23: 15 mL via OROMUCOSAL

## 2022-08-23 MED ORDER — INSULIN ASPART 100 UNIT/ML IV SOLN
5.0000 [IU] | Freq: Once | INTRAVENOUS | Status: DC
  Filled 2022-08-23: qty 0.05

## 2022-08-23 MED ORDER — SODIUM CHLORIDE 0.9 % IR SOLN
Status: DC | PRN
  Administered 2022-08-23: 3000 mL

## 2022-08-23 MED ORDER — LACTATED RINGERS IV SOLN: INTRAVENOUS | Status: DC

## 2022-08-23 MED ORDER — MIDAZOLAM HCL 5 MG/5ML IJ SOLN
INTRAMUSCULAR | Status: DC | PRN
  Administered 2022-08-23: 1 mg via INTRAVENOUS

## 2022-08-23 MED ORDER — DIPHENHYDRAMINE HCL 12.5 MG/5ML PO ELIX: 12.5000 mg | ORAL_SOLUTION | ORAL | Status: DC | PRN

## 2022-08-23 MED ORDER — CHLORHEXIDINE GLUCONATE 4 % EX LIQD: 60.0000 mL | Freq: Once | CUTANEOUS | Status: DC

## 2022-08-23 MED ORDER — ROSUVASTATIN CALCIUM 20 MG PO TABS
ORAL_TABLET | ORAL | Status: AC
  Filled 2022-08-23: qty 1

## 2022-08-23 MED ORDER — GABAPENTIN 300 MG PO CAPS
300.0000 mg | ORAL_CAPSULE | Freq: Once | ORAL | Status: AC
  Administered 2022-08-23: 300 mg via ORAL

## 2022-08-23 MED ORDER — METFORMIN HCL ER 500 MG PO TB24
ORAL_TABLET | ORAL | Status: AC
  Administered 2022-08-23: 500 mg via ORAL
  Filled 2022-08-23: qty 1

## 2022-08-23 MED ORDER — INSULIN ASPART 100 UNIT/ML IJ SOLN: 0.0000 [IU] | Freq: Three times a day (TID) | INTRAMUSCULAR | Status: DC

## 2022-08-23 MED ORDER — MIDAZOLAM HCL 2 MG/2ML IJ SOLN
INTRAMUSCULAR | Status: AC
  Filled 2022-08-23: qty 2

## 2022-08-23 MED ORDER — METOPROLOL TARTRATE 5 MG/5ML IV SOLN
INTRAVENOUS | Status: DC | PRN
  Administered 2022-08-23: 2 mg via INTRAVENOUS

## 2022-08-23 MED ORDER — ORAL CARE MOUTH RINSE: 15.0000 mL | Freq: Once | OROMUCOSAL | Status: AC

## 2022-08-23 MED ORDER — EPHEDRINE 5 MG/ML INJ
INTRAVENOUS | Status: AC
  Filled 2022-08-23: qty 5

## 2022-08-23 MED ORDER — ACETAMINOPHEN 10 MG/ML IV SOLN: 1000.0000 mg | Freq: Four times a day (QID) | INTRAVENOUS | Status: DC

## 2022-08-23 MED ORDER — CETIRIZINE HCL 10 MG PO TABS
ORAL_TABLET | ORAL | Status: AC
  Administered 2022-08-23: 5 mg via ORAL
  Filled 2022-08-23: qty 1

## 2022-08-23 MED ORDER — SENNOSIDES-DOCUSATE SODIUM 8.6-50 MG PO TABS
ORAL_TABLET | ORAL | Status: AC
  Administered 2022-08-23: 1
  Filled 2022-08-23: qty 1

## 2022-08-23 MED ORDER — DEXAMETHASONE SODIUM PHOSPHATE 10 MG/ML IJ SOLN
8.0000 mg | Freq: Once | INTRAMUSCULAR | Status: AC
  Administered 2022-08-23: 8 mg via INTRAVENOUS

## 2022-08-23 MED ORDER — METFORMIN HCL ER 500 MG PO TB24: 500.0000 mg | ORAL_TABLET | Freq: Two times a day (BID) | ORAL | Status: DC

## 2022-08-23 MED ORDER — INSULIN ASPART 100 UNIT/ML IJ SOLN
INTRAMUSCULAR | Status: AC
  Administered 2022-08-23: 15 [IU] via SUBCUTANEOUS
  Filled 2022-08-23: qty 1

## 2022-08-23 MED ORDER — VITAMIN B-12 1000 MCG PO TABS
1000.0000 ug | ORAL_TABLET | Freq: Every day | ORAL | Status: DC
  Administered 2022-08-24: 1000 ug via ORAL
  Filled 2022-08-23: qty 1

## 2022-08-23 MED ORDER — PHENYLEPHRINE HCL-NACL 20-0.9 MG/250ML-% IV SOLN
INTRAVENOUS | Status: DC | PRN
  Administered 2022-08-23: 25 ug/min via INTRAVENOUS

## 2022-08-23 MED ORDER — CHLORHEXIDINE GLUCONATE 0.12 % MT SOLN
OROMUCOSAL | Status: AC
  Filled 2022-08-23: qty 15

## 2022-08-23 SURGICAL SUPPLY — 74 items
ATTUNE PSFEM LTSZ4 NARCEM KNEE (Femur) IMPLANT
ATTUNE PSRP INSR SZ4 6 KNEE (Insert) IMPLANT
BASE TIBIAL ROT PLAT SZ 3 KNEE (Knees) IMPLANT
BATTERY INSTRU NAVIGATION (MISCELLANEOUS) ×4 IMPLANT
BLADE SAW 70X12.5 (BLADE) ×1 IMPLANT
BLADE SAW 90X13X1.19 OSCILLAT (BLADE) ×1 IMPLANT
BLADE SAW 90X25X1.19 OSCILLAT (BLADE) ×1 IMPLANT
BONE CEMENT GENTAMICIN (Cement) ×2 IMPLANT
CEMENT BONE GENTAMICIN 40 (Cement) IMPLANT
COOLER POLAR GLACIER W/PUMP (MISCELLANEOUS) ×1 IMPLANT
CUFF TOURN SGL QUICK 24 (TOURNIQUET CUFF)
CUFF TOURN SGL QUICK 34 (TOURNIQUET CUFF)
CUFF TRNQT CYL 24X4X16.5-23 (TOURNIQUET CUFF) IMPLANT
CUFF TRNQT CYL 34X4.125X (TOURNIQUET CUFF) IMPLANT
DRAPE 3/4 80X56 (DRAPES) ×1 IMPLANT
DRAPE INCISE IOBAN 66X45 STRL (DRAPES) IMPLANT
DRSG DERMACEA NONADH 3X8 (GAUZE/BANDAGES/DRESSINGS) ×1 IMPLANT
DRSG MEPILEX SACRM 8.7X9.8 (GAUZE/BANDAGES/DRESSINGS) ×1 IMPLANT
DRSG OPSITE POSTOP 4X14 (GAUZE/BANDAGES/DRESSINGS) ×1 IMPLANT
DRSG TEGADERM 4X4.75 (GAUZE/BANDAGES/DRESSINGS) ×1 IMPLANT
DURAPREP 26ML APPLICATOR (WOUND CARE) ×2 IMPLANT
ELECT CAUTERY BLADE 6.4 (BLADE) ×1 IMPLANT
ELECT REM PT RETURN 9FT ADLT (ELECTROSURGICAL) ×1
ELECTRODE REM PT RTRN 9FT ADLT (ELECTROSURGICAL) ×1 IMPLANT
EX-PIN ORTHOLOCK NAV 4X150 (PIN) ×2 IMPLANT
GLOVE BIO SURGEON STRL SZ7 (GLOVE) ×2 IMPLANT
GLOVE BIOGEL M STRL SZ7.5 (GLOVE) ×2 IMPLANT
GLOVE BIOGEL PI IND STRL 8 (GLOVE) ×1 IMPLANT
GLOVE PI ORTHO PRO STRL 7.5 (GLOVE) ×2 IMPLANT
GLOVE SURG UNDER POLY LF SZ7.5 (GLOVE) ×1 IMPLANT
GOWN STRL REUS W/ TWL LRG LVL3 (GOWN DISPOSABLE) ×2 IMPLANT
GOWN STRL REUS W/ TWL XL LVL3 (GOWN DISPOSABLE) ×1 IMPLANT
GOWN STRL REUS W/TWL LRG LVL3 (GOWN DISPOSABLE) ×2
GOWN STRL REUS W/TWL XL LVL3 (GOWN DISPOSABLE) ×1
HEMOVAC 400CC 10FR (MISCELLANEOUS) ×1 IMPLANT
HOLDER FOLEY CATH W/STRAP (MISCELLANEOUS) ×1 IMPLANT
HOLSTER ELECTROSUGICAL PENCIL (MISCELLANEOUS) ×1 IMPLANT
HOOD PEEL AWAY FLYTE STAYCOOL (MISCELLANEOUS) ×2 IMPLANT
IV NS IRRIG 3000ML ARTHROMATIC (IV SOLUTION) ×1 IMPLANT
KIT TURNOVER KIT A (KITS) ×1 IMPLANT
KNIFE SCULPS 14X20 (INSTRUMENTS) ×1 IMPLANT
MANIFOLD NEPTUNE II (INSTRUMENTS) ×2 IMPLANT
NDL SPNL 20GX3.5 QUINCKE YW (NEEDLE) ×2 IMPLANT
NEEDLE SPNL 20GX3.5 QUINCKE YW (NEEDLE) ×2 IMPLANT
NS IRRIG 500ML POUR BTL (IV SOLUTION) ×1 IMPLANT
PACK TOTAL KNEE (MISCELLANEOUS) ×1 IMPLANT
PAD ABD DERMACEA PRESS 5X9 (GAUZE/BANDAGES/DRESSINGS) ×2 IMPLANT
PAD WRAPON POLAR KNEE (MISCELLANEOUS) ×1 IMPLANT
PATELLA MEDIAL ATTUN 35MM KNEE (Knees) IMPLANT
PIN DRILL FIX HALF THREAD (BIT) ×2 IMPLANT
PIN FIXATION 1/8DIA X 3INL (PIN) ×1 IMPLANT
PULSAVAC PLUS IRRIG FAN TIP (DISPOSABLE) ×1
SOL PREP PVP 2OZ (MISCELLANEOUS) ×1
SOLUTION IRRIG SURGIPHOR (IV SOLUTION) ×1 IMPLANT
SOLUTION PREP PVP 2OZ (MISCELLANEOUS) ×1 IMPLANT
SPONGE DRAIN TRACH 4X4 STRL 2S (GAUZE/BANDAGES/DRESSINGS) ×1 IMPLANT
STAPLER SKIN PROX 35W (STAPLE) ×1 IMPLANT
STOCKINETTE IMPERV 14X48 (MISCELLANEOUS) IMPLANT
STRAP TIBIA SHORT (MISCELLANEOUS) ×1 IMPLANT
SUCTION FRAZIER HANDLE 10FR (MISCELLANEOUS) ×1
SUCTION TUBE FRAZIER 10FR DISP (MISCELLANEOUS) ×1 IMPLANT
SUT VIC AB 0 CT1 36 (SUTURE) ×2 IMPLANT
SUT VIC AB 1 CT1 36 (SUTURE) ×2 IMPLANT
SUT VIC AB 2-0 CT2 27 (SUTURE) ×1 IMPLANT
SYR 30ML LL (SYRINGE) ×2 IMPLANT
TIBIAL BASE ROT PLAT SZ 3 KNEE (Knees) ×1 IMPLANT
TIP FAN IRRIG PULSAVAC PLUS (DISPOSABLE) ×1 IMPLANT
TOWEL OR 17X26 4PK STRL BLUE (TOWEL DISPOSABLE) IMPLANT
TOWER CARTRIDGE SMART MIX (DISPOSABLE) ×1 IMPLANT
TRAP FLUID SMOKE EVACUATOR (MISCELLANEOUS) ×1 IMPLANT
TRAY FOLEY MTR SLVR 16FR STAT (SET/KITS/TRAYS/PACK) ×1 IMPLANT
WATER STERILE IRR 1000ML POUR (IV SOLUTION) IMPLANT
WATER STERILE IRR 500ML POUR (IV SOLUTION) ×1 IMPLANT
WRAPON POLAR PAD KNEE (MISCELLANEOUS) ×1

## 2022-08-23 NOTE — Anesthesia Procedure Notes (Signed)
Spinal  Patient location during procedure: OR Start time: 08/23/2022 7:23 AM End time: 08/23/2022 7:31 AM Reason for block: surgical anesthesia Staffing Performed: resident/CRNA  Resident/CRNA: Lia Foyer, CRNA Performed by: Lia Foyer, CRNA Authorized by: Darrin Nipper, MD   Preanesthetic Checklist Completed: patient identified, IV checked, site marked, risks and benefits discussed, surgical consent, monitors and equipment checked, pre-op evaluation and timeout performed Spinal Block Patient position: sitting Prep: ChloraPrep Patient monitoring: heart rate, cardiac monitor, continuous pulse ox and blood pressure Approach: midline Location: L3-4 Injection technique: single-shot Needle Needle type: Pencan  Needle gauge: 25 G Needle length: 9 cm Assessment Sensory level: T4 Events: CSF return

## 2022-08-23 NOTE — Progress Notes (Signed)
Patient had a glucose reading of 401 after eating and drinking, informed MD of previous reading, medication given - instructed to give 15 units after blood draw.

## 2022-08-23 NOTE — Anesthesia Preprocedure Evaluation (Addendum)
Anesthesia Evaluation  Patient identified by MRN, date of birth, ID band Patient awake    Reviewed: Allergy & Precautions, NPO status , Patient's Chart, lab work & pertinent test results  History of Anesthesia Complications Negative for: history of anesthetic complications  Airway Mallampati: IV   Neck ROM: Full    Dental  (+) Poor Dentition   Pulmonary former smoker (quit 1985),    Pulmonary exam normal breath sounds clear to auscultation       Cardiovascular + CAD and + Peripheral Vascular Disease (carotid stenosis)  Normal cardiovascular exam Rhythm:Regular Rate:Normal  ECG 03/20/22: NSR, low voltage   Neuro/Psych PSYCHIATRIC DISORDERS Depression negative neurological ROS     GI/Hepatic GERD  ,  Endo/Other  diabetes, Type 2Hypothyroidism   Renal/GU negative Renal ROS     Musculoskeletal   Abdominal   Peds  Hematology  (+) Blood dyscrasia, anemia ,   Anesthesia Other Findings Last dose of Trulicity 51/8/84.  Reproductive/Obstetrics                            Anesthesia Physical Anesthesia Plan  ASA: 3  Anesthesia Plan: General and Spinal   Post-op Pain Management:    Induction: Intravenous  PONV Risk Score and Plan: 3 and Propofol infusion, TIVA, Treatment may vary due to age or medical condition and Ondansetron  Airway Management Planned: Natural Airway and Nasal Cannula  Additional Equipment:   Intra-op Plan:   Post-operative Plan:   Informed Consent: I have reviewed the patients History and Physical, chart, labs and discussed the procedure including the risks, benefits and alternatives for the proposed anesthesia with the patient or authorized representative who has indicated his/her understanding and acceptance.       Plan Discussed with: CRNA  Anesthesia Plan Comments: (Plan for spinal and GA with natural airway, LMA/GETA backup.  Patient consented for risks of  anesthesia including but not limited to:  - adverse reactions to medications - damage to eyes, teeth, lips or other oral mucosa - nerve damage due to positioning  - sore throat or hoarseness - headache, bleeding, infection, nerve damage 2/2 spinal - damage to heart, brain, nerves, lungs, other parts of body or loss of life  Informed patient about role of CRNA in peri- and intra-operative care.  Patient voiced understanding.)        Anesthesia Quick Evaluation

## 2022-08-23 NOTE — Op Note (Signed)
OPERATIVE NOTE  DATE OF SURGERY:  08/23/2022  PATIENT NAME:  Jody Wang   DOB: [birth year]  MRN: [id number]  PRE-OPERATIVE DIAGNOSIS: Degenerative arthrosis of the left knee, primary  POST-OPERATIVE DIAGNOSIS:  Same  PROCEDURE:  Left total knee arthroplasty using computer-assisted navigation  SURGEON:  Marciano Sequin. M.D.  ASSISTANT: Cassell Smiles, PA-C (present and scrubbed throughout the case, critical for assistance with exposure, retraction, instrumentation, and closure)  ANESTHESIA: spinal  ESTIMATED BLOOD LOSS: 50 mL  FLUIDS REPLACED: 1000 mL of crystalloid  TOURNIQUET TIME: 88 minutes  DRAINS: 2 medium Hemovac drains  SOFT TISSUE RELEASES: Anterior cruciate ligament, posterior cruciate ligament, deep medial collateral ligament, patellofemoral ligament  IMPLANTS UTILIZED: DePuy Attune size 4N posterior stabilized femoral component (cemented), size 3 rotating platform tibial component (cemented), 35 mm medialized dome patella (cemented), and a 6 mm stabilized rotating platform polyethylene insert.  INDICATIONS FOR SURGERY: Jody Wang is a 76 y.o. year old adult with a long history of progressive knee pain. X-rays demonstrated severe degenerative changes in tricompartmental fashion. The patient had not seen any significant improvement despite conservative nonsurgical intervention. After discussion of the risks and benefits of surgical intervention, the patient expressed understanding of the risks benefits and agree with plans for total knee arthroplasty.   The risks, benefits, and alternatives were discussed at length including but not limited to the risks of infection, bleeding, nerve injury, stiffness, blood clots, the need for revision surgery, cardiopulmonary complications, among others, and they were willing to proceed.  PROCEDURE IN DETAIL: The patient was brought into the operating room and, after adequate spinal anesthesia was achieved, a tourniquet was  placed on the patient's upper thigh. The patient's knee and leg were cleaned and prepped with alcohol and DuraPrep and draped in the usual sterile fashion. A "timeout" was performed as per usual protocol. The lower extremity was exsanguinated using an Esmarch, and the tourniquet was inflated to 300 mmHg. An anterior longitudinal incision was made followed by a standard mid vastus approach. The deep fibers of the medial collateral ligament were elevated in a subperiosteal fashion off of the medial flare of the tibia so as to maintain a continuous soft tissue sleeve. The patella was subluxed laterally and the patellofemoral ligament was incised. Inspection of the knee demonstrated severe degenerative changes with full-thickness loss of articular cartilage. Osteophytes were debrided using a rongeur. Anterior and posterior cruciate ligaments were excised. Two 4.0 mm Schanz pins were inserted in the femur and into the tibia for attachment of the array of trackers used for computer-assisted navigation. Hip center was identified using a circumduction technique. Distal landmarks were mapped using the computer. The distal femur and proximal tibia were mapped using the computer. The distal femoral cutting guide was positioned using computer-assisted navigation so as to achieve a 5 distal valgus cut. The femur was sized and it was felt that a size 4N femoral component was appropriate. A size 4 femoral cutting guide was positioned and the anterior cut was performed and verified using the computer. This was followed by completion of the posterior and chamfer cuts. Femoral cutting guide for the central box was then positioned in the center box cut was performed.  Attention was then directed to the proximal tibia. Medial and lateral menisci were excised. The extramedullary tibial cutting guide was positioned using computer-assisted navigation so as to achieve a 0 varus-valgus alignment and 3 posterior slope. The cut was  performed and verified using the computer. The proximal tibia was  sized and it was felt that a size 3 tibial tray was appropriate. Tibial and femoral trials were inserted followed by insertion of a 6 mm polyethylene insert. This allowed for excellent mediolateral soft tissue balancing both in flexion and in full extension. Finally, the patella was cut and prepared so as to accommodate a 35 mm medialized dome patella. A patella trial was placed and the knee was placed through a range of motion with excellent patellar tracking appreciated. The femoral trial was removed after debridement of posterior osteophytes. The central post-hole for the tibial component was reamed followed by insertion of a keel punch. Tibial trials were then removed. Cut surfaces of bone were irrigated with copious amounts of normal saline using pulsatile lavage and then suctioned dry. Polymethylmethacrylate cement with gentamicin was prepared in the usual fashion using a vacuum mixer. Cement was applied to the cut surface of the proximal tibia as well as along the undersurface of a size 3 rotating platform tibial component. Tibial component was positioned and impacted into place. Excess cement was removed using [employer] fast streamer. Cement was then applied to the cut surfaces of the femur as well as along the posterior flanges of the size 4N femoral component. The femoral component was positioned and impacted into place. Excess cement was removed using [employer] fast streamer. A 6 mm polyethylene trial was inserted and the knee was brought into full extension with steady axial compression applied. Finally, cement was applied to the backside of a 35 mm medialized dome patella and the patellar component was positioned and patellar clamp applied. Excess cement was removed using [employer] fast streamer. After adequate curing of the cement, the tourniquet was deflated after a total tourniquet time of 88 minutes. Hemostasis was achieved using electrocautery. The knee was  irrigated with copious amounts of normal saline using pulsatile lavage followed by 450 ml of Surgiphor and then suctioned dry. 20 mL of 1.3% Exparel and 60 mL of 0.25% Marcaine in 40 mL of normal saline was injected along the posterior capsule, medial and lateral gutters, and along the arthrotomy site. A 6 mm stabilized rotating platform polyethylene insert was inserted and the knee was placed through a range of motion with excellent mediolateral soft tissue balancing appreciated and excellent patellar tracking noted. 2 medium drains were placed in the wound bed and brought out through separate stab incisions. The medial parapatellar portion of the incision was reapproximated using interrupted sutures of #1 Vicryl. Subcutaneous tissue was approximated in layers using first #0 Vicryl followed #2-0 Vicryl. The skin was approximated with skin staples. A sterile dressing was applied.  The patient tolerated the procedure well and was transported to the recovery room in stable condition.    Evette Diclemente P. Holley Bouche., M.D.

## 2022-08-23 NOTE — Interval H&P Note (Signed)
History and Physical Interval Note:  08/23/2022 6:16 AM  Jody Wang  has presented today for surgery, with the diagnosis of PRIMARY OSTEOARTHRITIS OF LEFT KNEE..  The various methods of treatment have been discussed with the patient and family. After consideration of risks, benefits and other options for treatment, the patient has consented to  Procedure(s): COMPUTER ASSISTED TOTAL KNEE ARTHROPLASTY - RNFA (Left) as a surgical intervention.  The patient's history has been reviewed, patient examined, no change in status, stable for surgery.  I have reviewed the patient's chart and labs.  Questions were answered to the patient's satisfaction.     [place]

## 2022-08-23 NOTE — Evaluation (Signed)
Physical Therapy Evaluation Patient Details Name: Jody Wang MRN: [id number] DOB: April 19, 1946 Today's Date: 08/23/2022  History of Present Illness  Pt is a 76 yo F diagnosed with degenerative arthrosis of the left knee and is s/p elective L TKA. PMH includes DM, depression, GERD, CAD, R TKA in May, 2023, and PVD.   Clinical Impression  Pt was pleasant and motivated to participate during the session and put forth good effort throughout. Pt required no physical assistance during the session but did require extra time and effort throughout along with cuing for proper sequencing with transfers and gait.  Pt reported no adverse symptoms during the session other than L knee pain with SpO2 and HR WNL on room air.  Pt is expected to make good progress towards goals while in acute care and will benefit from HHPT upon discharge to safely address deficits listed in patient problem list for decreased caregiver assistance and eventual return to PLOF.         Recommendations for follow up therapy are one component of a multi-disciplinary discharge planning process, led by the attending physician.  Recommendations may be updated based on patient status, additional functional criteria and insurance authorization.  Follow Up Recommendations Home health PT      Assistance Recommended at Discharge Intermittent Supervision/Assistance  Patient can return home with the following  A little help with walking and/or transfers;A little help with bathing/dressing/bathroom;Assist for transportation;Assistance with cooking/housework    Equipment Recommendations None recommended by PT  Recommendations for Other Services       Functional Status Assessment Patient has had a recent decline in their functional status and demonstrates the ability to make significant improvements in function in a reasonable and predictable amount of time.     Precautions / Restrictions Precautions Precautions:  Fall Restrictions Weight Bearing Restrictions: Yes LLE Weight Bearing: Weight bearing as tolerated Other Position/Activity Restrictions: Pt able to perform Ind LLE SLR without extensor lag, no KI required      Mobility  Bed Mobility Overal bed mobility: Modified Independent             General bed mobility comments: Min extra time, effort, and use of bed rails required    Transfers Overall transfer level: Needs assistance Equipment used: Rolling walker (2 wheels) Transfers: Sit to/from Stand Sit to Stand: Min guard           General transfer comment: Min verbal cues for sequencing provided with good eccentric and concentric control and stability    Ambulation/Gait Ambulation/Gait assistance: Min guard Gait Distance (Feet): 10 Feet Assistive device: Rolling walker (2 wheels) Gait Pattern/deviations: Step-to pattern, Antalgic, Decreased stance time - left, Decreased step length - right Gait velocity: decreased     General Gait Details: Slow, antalgic cadence with mod verbal cues for amb closer to the RW with upright posture; no LOB or buckling during the session  Stairs            Wheelchair Mobility    Modified Rankin (Stroke Patients Only)       Balance Overall balance assessment: Needs assistance   Sitting balance-Leahy Scale: Normal     Standing balance support: Bilateral upper extremity supported, During functional activity Standing balance-Leahy Scale: Good                               Pertinent Vitals/Pain Pain Assessment Pain Assessment: 0-10 Pain Score: 7  Pain Location: L knee Pain  Descriptors / Indicators: Sore, Aching Pain Intervention(s): Repositioned, Premedicated before session, Ice applied, Monitored during session    Home Living Family/patient expects to be discharged to:: Private residence Living Arrangements: Children Available Help at Discharge: Family;Friend(s);Available 24 hours/day Type of Home:  House Home Access: Level entry       Home Layout: One level Home Equipment: Cane - single point;Shower seat - built Agricultural consultant (2 wheels)      Prior Function Prior Level of Function : Independent/Modified Independent             Mobility Comments: Ind amb without an AD limited community distances only secondary to L knee pain, no fall history ADLs Comments: Ind with ADLs     Hand Dominance   Dominant Hand: Right    Extremity/Trunk Assessment   Upper Extremity Assessment Upper Extremity Assessment: Overall WFL for tasks assessed    Lower Extremity Assessment Lower Extremity Assessment: Generalized weakness;LLE deficits/detail LLE Deficits / Details: BLE ankle strength, AROM, and sensation to light touch grossly intact; LLE hip flex strength >/= 3/5 LLE: Unable to fully assess due to pain LLE Sensation: WNL LLE Coordination: WNL       Communication   Communication: No difficulties  Cognition Arousal/Alertness: Awake/alert Behavior During Therapy: WFL for tasks assessed/performed Overall Cognitive Status: Within Functional Limits for tasks assessed                                          General Comments      Exercises Total Joint Exercises Ankle Circles/Pumps: AROM, Strengthening, Both, 10 reps Quad Sets: AROM, Strengthening, Left, 5 reps, 10 reps Straight Leg Raises: Strengthening, Left, 5 reps Long Arc Quad: AROM, Strengthening, Left, 5 reps, 10 reps Knee Flexion: AROM, Strengthening, Left, 10 reps, 5 reps Goniometric ROM: L knee AROM: 5-68 deg, limited by wrapping Marching in Standing: AROM, Strengthening, Both, 5 reps, Standing Other Exercises Other Exercises: HEP education for LLE QS and seated knee flex per handout   Assessment/Plan    PT Assessment Patient needs continued PT services  PT Problem List Decreased strength;Decreased range of motion;Decreased activity tolerance;Decreased balance;Decreased  mobility;Decreased knowledge of use of DME;Pain       PT Treatment Interventions DME instruction;Gait training;Stair training;Functional mobility training;Therapeutic activities;Therapeutic exercise;Balance training;Patient/family education    PT Goals (Current goals can be found in the Care Plan section)  Acute Rehab PT Goals Patient Stated Goal: To walk without pain PT Goal Formulation: With patient Time For Goal Achievement: 09/05/22 Potential to Achieve Goals: Good    Frequency BID     Co-evaluation               AM-PAC PT "6 Clicks" Mobility  Outcome Measure Help needed turning from your back to your side while in a flat bed without using bedrails?: A Little Help needed moving from lying on your back to sitting on the side of a flat bed without using bedrails?: A Little Help needed moving to and from a bed to a chair (including a wheelchair)?: A Little Help needed standing up from a chair using your arms (e.g., wheelchair or bedside chair)?: A Little Help needed to walk in hospital room?: A Little Help needed climbing 3-5 steps with a railing? : A Lot 6 Click Score: 17    End of Session Equipment Utilized During Treatment: Gait belt Activity Tolerance: Patient tolerated treatment well Patient left:  Other (comment) (Pt left with nursing for transfer to [facility]) Nurse Communication: Mobility status;Weight bearing status PT Visit Diagnosis: Other abnormalities of gait and mobility (R26.89);Muscle weakness (generalized) (M62.81);Pain Pain - Right/Left: Left Pain - part of body: Knee    Time: 1410-1449 PT Time Calculation (min) (ACUTE ONLY): 39 min   Charges:   PT Evaluation $PT Eval Moderate Complexity: 1 Mod PT Treatments $Therapeutic Exercise: 8-22 mins $Therapeutic Activity: 8-22 mins       D. Scott Morell Mears PT, DPT 08/23/22, 4:02 PM

## 2022-08-23 NOTE — Anesthesia Postprocedure Evaluation (Signed)
Anesthesia Post Note  Patient: Jody Wang  Procedure(s) Performed: COMPUTER ASSISTED TOTAL KNEE ARTHROPLASY (Left: Knee)  Patient location during evaluation: PACU Anesthesia Type: Spinal Level of consciousness: awake and alert, oriented and patient cooperative Pain management: pain level controlled Vital Signs Assessment: post-procedure vital signs reviewed and stable Respiratory status: spontaneous breathing, nonlabored ventilation and respiratory function stable Cardiovascular status: blood pressure returned to baseline and stable Postop Assessment: adequate PO intake, no headache and spinal receding Anesthetic complications: no   No notable events documented.   Last Vitals:  Vitals:   08/23/22 1145 08/23/22 1200  BP: 115/65 (!) 121/51  Pulse: 98 95  Resp: 14 14  Temp:    SpO2: 97% 96%    Last Pain:  Vitals:   08/23/22 1200  TempSrc:   PainSc: 0-No pain                 Darrin Nipper

## 2022-08-23 NOTE — Transfer of Care (Signed)
Immediate Anesthesia Transfer of Care Note  Patient: Jody Wang  Procedure(s) Performed: COMPUTER ASSISTED TOTAL KNEE ARTHROPLASY (Left: Knee)  Patient Location: PACU  Anesthesia Type:General and Spinal  Level of Consciousness: drowsy  Airway & Oxygen Therapy: Patient Spontanous Breathing  Post-op Assessment: Report given to RN and Post -op Vital signs reviewed and stable  Post vital signs: Reviewed and stable  Last Vitals:  Vitals Value Taken Time  BP 104/49 08/23/22 1103  Temp    Pulse 97 08/23/22 1106  Resp 16 08/23/22 1106  SpO2 98 % 08/23/22 1106  Vitals shown include unvalidated device data.  Last Pain:  Vitals:   08/23/22 0631  TempSrc: Temporal  PainSc: 8       Patients Stated Pain Goal: 0 (74/09/92 7800)  Complications: No notable events documented.

## 2022-08-24 ENCOUNTER — Encounter: Payer: Self-pay | Admitting: Orthopedic Surgery

## 2022-08-24 DIAGNOSIS — Z79899 Other long term (current) drug therapy: Secondary | ICD-10-CM | POA: Diagnosis not present

## 2022-08-24 DIAGNOSIS — Z86718 Personal history of other venous thrombosis and embolism: Secondary | ICD-10-CM | POA: Diagnosis not present

## 2022-08-24 DIAGNOSIS — E119 Type 2 diabetes mellitus without complications: Secondary | ICD-10-CM | POA: Diagnosis not present

## 2022-08-24 DIAGNOSIS — M1712 Unilateral primary osteoarthritis, left knee: Secondary | ICD-10-CM | POA: Diagnosis not present

## 2022-08-24 DIAGNOSIS — Z7982 Long term (current) use of aspirin: Secondary | ICD-10-CM | POA: Diagnosis not present

## 2022-08-24 DIAGNOSIS — Z87891 Personal history of nicotine dependence: Secondary | ICD-10-CM | POA: Diagnosis not present

## 2022-08-24 DIAGNOSIS — Z96651 Presence of right artificial knee joint: Secondary | ICD-10-CM | POA: Diagnosis not present

## 2022-08-24 DIAGNOSIS — Z7984 Long term (current) use of oral hypoglycemic drugs: Secondary | ICD-10-CM | POA: Diagnosis not present

## 2022-08-24 DIAGNOSIS — Z7985 Long-term (current) use of injectable non-insulin antidiabetic drugs: Secondary | ICD-10-CM | POA: Diagnosis not present

## 2022-08-24 DIAGNOSIS — I2510 Atherosclerotic heart disease of native coronary artery without angina pectoris: Secondary | ICD-10-CM | POA: Diagnosis not present

## 2022-08-24 DIAGNOSIS — E039 Hypothyroidism, unspecified: Secondary | ICD-10-CM | POA: Diagnosis not present

## 2022-08-24 LAB — GLUCOSE, CAPILLARY: Glucose-Capillary: 271 mg/dL — ABNORMAL HIGH (ref 70–99)

## 2022-08-24 MED ORDER — CETIRIZINE HCL 10 MG PO TABS
ORAL_TABLET | ORAL | Status: AC
  Administered 2022-08-24: 5 mg via ORAL
  Filled 2022-08-24: qty 1

## 2022-08-24 MED ORDER — VITAMIN D 25 MCG (1000 UNIT) PO TABS
ORAL_TABLET | ORAL | Status: AC
  Administered 2022-08-24: 1000 [IU] via ORAL
  Filled 2022-08-24: qty 1

## 2022-08-24 MED ORDER — ENOXAPARIN SODIUM 30 MG/0.3ML IJ SOSY
PREFILLED_SYRINGE | INTRAMUSCULAR | Status: AC
  Administered 2022-08-24: 30 mg via SUBCUTANEOUS
  Filled 2022-08-24: qty 0.3

## 2022-08-24 MED ORDER — TRAMADOL HCL 50 MG PO TABS
ORAL_TABLET | ORAL | Status: AC
  Administered 2022-08-24: 100 mg via ORAL
  Filled 2022-08-24: qty 2

## 2022-08-24 MED ORDER — ENOXAPARIN SODIUM 40 MG/0.4ML IJ SOSY: 40.0000 mg | PREFILLED_SYRINGE | INTRAMUSCULAR | 0 refills | Status: DC

## 2022-08-24 MED ORDER — HYDROCODONE-ACETAMINOPHEN 5-325 MG PO TABS: 1.0000 | ORAL_TABLET | ORAL | 0 refills | Status: DC | PRN

## 2022-08-24 MED ORDER — FLUOXETINE HCL 20 MG PO CAPS
ORAL_CAPSULE | ORAL | Status: AC
  Administered 2022-08-24: 20 mg via ORAL
  Filled 2022-08-24: qty 1

## 2022-08-24 MED ORDER — ROSUVASTATIN CALCIUM 20 MG PO TABS
ORAL_TABLET | ORAL | Status: AC
  Filled 2022-08-24: qty 1

## 2022-08-24 MED ORDER — FERROUS SULFATE 325 (65 FE) MG PO TABS
ORAL_TABLET | ORAL | Status: AC
  Administered 2022-08-24: 325 mg via ORAL
  Filled 2022-08-24: qty 1

## 2022-08-24 MED ORDER — ROSUVASTATIN CALCIUM 20 MG PO TABS
ORAL_TABLET | ORAL | Status: AC
  Administered 2022-08-24: 40 mg via ORAL
  Filled 2022-08-24: qty 1

## 2022-08-24 MED ORDER — INSULIN ASPART 100 UNIT/ML IJ SOLN
INTRAMUSCULAR | Status: AC
  Administered 2022-08-24: 8 [IU] via SUBCUTANEOUS
  Filled 2022-08-24: qty 1

## 2022-08-24 MED ORDER — CELECOXIB 200 MG PO CAPS
ORAL_CAPSULE | ORAL | Status: AC
  Administered 2022-08-24: 200 mg via ORAL
  Filled 2022-08-24: qty 1

## 2022-08-24 MED ORDER — ACETAMINOPHEN 10 MG/ML IV SOLN
INTRAVENOUS | Status: AC
  Administered 2022-08-24: 1000 mg via INTRAVENOUS
  Filled 2022-08-24: qty 100

## 2022-08-24 MED ORDER — METFORMIN HCL 500 MG PO TABS
ORAL_TABLET | ORAL | Status: AC
  Filled 2022-08-24: qty 1

## 2022-08-24 MED ORDER — SENNOSIDES-DOCUSATE SODIUM 8.6-50 MG PO TABS
ORAL_TABLET | ORAL | Status: AC
  Administered 2022-08-24: 1 via ORAL
  Filled 2022-08-24: qty 1

## 2022-08-24 MED ORDER — PANTOPRAZOLE SODIUM 40 MG PO TBEC
DELAYED_RELEASE_TABLET | ORAL | Status: AC
  Administered 2022-08-24: 40 mg via ORAL
  Filled 2022-08-24: qty 1

## 2022-08-24 MED ORDER — METFORMIN HCL ER 500 MG PO TB24
ORAL_TABLET | ORAL | Status: AC
  Administered 2022-08-24: 500 mg via ORAL
  Filled 2022-08-24: qty 1

## 2022-08-24 MED ORDER — TRAMADOL HCL 50 MG PO TABS: 50.0000 mg | ORAL_TABLET | ORAL | 0 refills | Status: DC | PRN

## 2022-08-24 MED ORDER — CELECOXIB 200 MG PO CAPS: 200.0000 mg | ORAL_CAPSULE | Freq: Two times a day (BID) | ORAL | 0 refills | Status: AC

## 2022-08-24 MED ORDER — MAGNESIUM HYDROXIDE 400 MG/5ML PO SUSP
ORAL | Status: AC
  Administered 2022-08-24: 30 mL via ORAL
  Filled 2022-08-24: qty 30

## 2022-08-24 MED ORDER — METOCLOPRAMIDE HCL 10 MG PO TABS
ORAL_TABLET | ORAL | Status: AC
  Administered 2022-08-24: 10 mg via ORAL
  Filled 2022-08-24: qty 1

## 2022-08-24 NOTE — Discharge Summary (Signed)
Physician Discharge Summary  Patient ID: TIEARA FLITTON MRN: [id number] DOB/AGE: 1945/12/18 76 y.o.  Admit date: 08/23/2022 Discharge date: 08/24/2022  Admission Diagnoses:  Total knee replacement status [Z96.659]  Surgeries:Procedure(s):  Left total knee arthroplasty using computer-assisted navigation   SURGEON:  Marciano Sequin. M.D.   ASSISTANT: Cassell Smiles, PA-C (present and scrubbed throughout the case, critical for assistance with exposure, retraction, instrumentation, and closure)   ANESTHESIA: spinal   ESTIMATED BLOOD LOSS: 50 mL   FLUIDS REPLACED: 1000 mL of crystalloid   TOURNIQUET TIME: 88 minutes   DRAINS: 2 medium Hemovac drains   SOFT TISSUE RELEASES: Anterior cruciate ligament, posterior cruciate ligament, deep medial collateral ligament, patellofemoral ligament   IMPLANTS UTILIZED: DePuy Attune size 4N posterior stabilized femoral component (cemented), size 3 rotating platform tibial component (cemented), 35 mm medialized dome patella (cemented), and a 6 mm stabilized rotating platform polyethylene insert.  Discharge Diagnoses: Patient Active Problem List   Diagnosis Date Noted   Total knee replacement status 08/23/2022   Major depressive disorder, single episode, mild ([place]) 06/18/2022   Status post total right knee replacement 03/17/2022   Localized, primary osteoarthritis of shoulder region 02/19/2022   Primary osteoarthritis of left knee 02/19/2022   Non-recurrent acute serous otitis media of right ear 01/11/2022   Joint pain 11/13/2021   Memory change 10/11/2020   Carotid bruit 06/06/2020   Anemia 02/22/2020   Acute cough 01/19/2020   Lung nodule, multiple 03/14/2018   AK (actinic keratosis) 03/14/2018   Aortic atherosclerosis ([place]) 05/14/2017   Coronary artery disease of native artery of native heart with stable angina pectoris ([place]) 05/14/2017   Abnormal CT of the chest 02/11/2017   SOB (shortness of breath) on exertion 02/01/2017    Chest pain 01/28/2017   Primary osteoarthritis of right knee 08/29/2016   Shingles 07/02/2016   Knee pain 07/11/2015   Health care maintenance 02/27/2015   Carotid artery disease ([place]) 02/27/2015   Carotid stenosis 02/27/2015   Cerumen impaction 06/21/2014   Weight loss 04/12/2014   Environmental allergies 04/12/2014   Right shoulder pain 10/08/2013   GERD (gastroesophageal reflux disease) 10/09/2012   Mild depression 10/08/2012   Hypercholesterolemia 10/08/2012   Hypothyroidism 10/08/2012   Type 2 diabetes mellitus with hyperglycemia ([place]) 10/08/2012    Past Medical History:  Diagnosis Date   AK (actinic keratosis)    Anemia    Aortic atherosclerosis ([facility])    Carotid artery disease ([facility])    Carotid bruit    Complication of anesthesia    hard time waking up x 1 surgery   Depression    Diabetes mellitus without complication ([facility])    GERD (gastroesophageal reflux disease)    Heart murmur    Hypercholesterolemia    Hypothyroidism    Lung nodule, multiple    Osteoarthritis of knee    Shingles    Wears dentures    partial lower     Transfusion:    Consultants (if any):   Discharged Condition: Improved  Hospital Course: Jody Wang is an 76 y.o. adult who was admitted 08/23/2022 with a diagnosis of left knee osteoarthritis and went to the operating room on 08/23/2022 and underwent left total knee arthoplasty. The patient received perioperative antibiotics for prophylaxis (see below). The patient tolerated the procedure well and was transported to PACU in stable condition. After meeting PACU criteria, the patient was subsequently transferred to the Orthopaedics/Rehabilitation unit.   The patient received DVT prophylaxis in the form of early mobilization, Lovenox,  TED hose, and SCDs . A sacral pad had been placed and heels were elevated off of the bed with rolled towels in order to protect skin integrity. Foley catheter was discontinued on postoperative day #0. Wound  drains were discontinued on postoperative day #1. The surgical incision was healing well without signs of infection.  Physical therapy was initiated postoperatively for transfers, gait training, and strengthening. Occupational therapy was initiated for activities of daily living and evaluation for assisted devices. Rehabilitation goals were reviewed in detail with the patient. The patient made steady progress with physical therapy and physical therapy recommended discharge to Home.   The patient achieved the preliminary goals of this hospitalization and was felt to be medically and orthopaedically appropriate for discharge.  He was given perioperative antibiotics:  Anti-infectives (From admission, onward)    Start     Dose/Rate Route Frequency Ordered Stop   08/23/22 1926  ceFAZolin (ANCEF) 2-4 GM/100ML-% IVPB       Note to Pharmacy: Sharmon Leyden A: cabinet override      08/23/22 1926 08/23/22 1930   08/23/22 1415  ceFAZolin (ANCEF) 2-4 GM/100ML-% IVPB       Note to Pharmacy: Jordan Hawks H: cabinet override      08/23/22 1415 08/23/22 1646   08/23/22 1400  ceFAZolin (ANCEF) IVPB 2g/100 mL premix        2 g 200 mL/hr over 30 Minutes Intravenous Every 6 hours 08/23/22 1314 08/23/22 1958   08/23/22 1245  ceFAZolin (ANCEF) IVPB 2g/100 mL premix  Status:  Discontinued        2 g 200 mL/hr over 30 Minutes Intravenous Every 6 hours 08/23/22 1238 08/23/22 1314   08/23/22 0615  ceFAZolin (ANCEF) 2-4 GM/100ML-% IVPB       Note to Pharmacy: [place] Calix, Cryst: cabinet override      08/23/22 0615 08/23/22 0751   08/23/22 0600  ceFAZolin (ANCEF) IVPB 2g/100 mL premix        2 g 200 mL/hr over 30 Minutes Intravenous On call to O.R. 08/23/22 0121 08/23/22 0727     .  Recent vital signs:  Vitals:   08/23/22 2154 08/24/22 0500  BP: (!) 102/57 (!) 115/56  Pulse: 95 88  Resp: 16 16  Temp: (!) 97.5 F (36.4 C) 97.8 F (36.6 C)  SpO2: 97% 97%    Recent laboratory studies:   Recent Labs    08/23/22 1719  GLUCOSE 447*    Diagnostic Studies: DG Knee Left Port  Result Date: 08/23/2022 CLINICAL DATA:  Left knee arthroplasty EXAM: PORTABLE LEFT KNEE - 1-2 VIEW COMPARISON:  None Available. FINDINGS: Postsurgical changes from left total knee arthroplasty. Arthroplasty components are in their expected alignment. No periprosthetic fracture or evidence of other complication. Expected postoperative changes within the overlying soft tissues. IMPRESSION: Satisfactory postoperative appearance status post left total knee arthroplasty. Electronically Signed   By: Davina Poke D.O.   On: 08/23/2022 11:29    Discharge Medications:   Allergies as of 08/24/2022       Reactions   Codeine Shortness Of Breath   stops breathing   Meperidine And Related Shortness Of Breath   Stops breathing   Oxycontin [oxycodone Hcl] Shortness Of Breath   Stops breathing   Ezetimibe Other (See Comments)   Doesn't remember reaction Other reaction(s): Muscle Pain   Silicone Other (See Comments)   Jardiance [empagliflozin] Other (See Comments)   Yeast infections   Tape Rash   Paper tape ok  Medication List     STOP taking these medications    aspirin 81 MG chewable tablet   diclofenac 50 MG EC tablet Commonly known as: VOLTAREN       TAKE these medications    acetaminophen 500 MG tablet Commonly known as: TYLENOL Take 1-2 tablets (500-1,000 mg total) by mouth every 6 (six) hours as needed for mild pain.   blood glucose meter kit and supplies Kit Dispense based on patient and insurance preference. Use to check blood sugars twice daily. DX E11.9   celecoxib 200 MG capsule Commonly known as: CELEBREX Take 1 capsule (200 mg total) by mouth 2 (two) times daily. What changed: Another medication with the same name was added. Make sure you understand how and when to take each.   celecoxib 200 MG capsule Commonly known as: CELEBREX Take 1 capsule (200 mg total) by  mouth 2 (two) times daily. What changed: You were already taking a medication with the same name, and this prescription was added. Make sure you understand how and when to take each.   cholecalciferol 1000 units tablet Commonly known as: VITAMIN D Take 1,000 Units by mouth daily.   cyanocobalamin 1000 MCG tablet Commonly known as: VITAMIN B12 Take 1,000 mcg by mouth daily.   enoxaparin 40 MG/0.4ML injection Commonly known as: LOVENOX Inject 0.4 mLs (40 mg total) into the skin daily for 14 days.   FLUoxetine 20 MG capsule Commonly known as: PROZAC Take 1 capsule (20 mg total) by mouth daily. What changed: when to take this   fluticasone 50 MCG/ACT nasal spray Commonly known as: FLONASE USE 2 SPRAYS IN BOTH  NOSTRILS DAILY   glucose blood test strip Use as instructed to check blood sugars to check blood sugars twice daily. Dx E11.9   OneTouch Verio test strip Generic drug: glucose blood TEST BLOOD SUGAR TWICE DAILY AS DIRECTED dx e11.9   HYDROcodone-acetaminophen 5-325 MG tablet Commonly known as: Norco Take 1 tablet by mouth every 4 (four) hours as needed for severe pain.   levocetirizine 5 MG tablet Commonly known as: XYZAL TAKE 1 TABLET BY MOUTH  DAILY   levothyroxine 88 MCG tablet Commonly known as: SYNTHROID TAKE 1 TABLET BY MOUTH DAILY   metFORMIN 500 MG 24 hr tablet Commonly known as: GLUCOPHAGE-XR TAKE 1 TABLET BY MOUTH TWICE  DAILY   mupirocin ointment 2 % Commonly known as: BACTROBAN Apply 1 application topically 2 (two) times daily.   ondansetron 4 MG tablet Commonly known as: ZOFRAN Take 1 tablet (4 mg total) by mouth every 6 (six) hours as needed for nausea.   pantoprazole 40 MG tablet Commonly known as: PROTONIX Take 1 tablet (40 mg total) by mouth daily. TAKE 1 TABLET BY MOUTH  DAILY   pravastatin 40 MG tablet Commonly known as: PRAVACHOL Take 40 mg by mouth daily.   rosuvastatin 40 MG tablet Commonly known as: CRESTOR TAKE 1 TABLET BY  MOUTH DAILY   traMADol 50 MG tablet Commonly known as: ULTRAM Take 1-2 tablets (50-100 mg total) by mouth every 4 (four) hours as needed for moderate pain. What changed: Another medication with the same name was added. Make sure you understand how and when to take each.   traMADol 50 MG tablet Commonly known as: ULTRAM Take 1-2 tablets (50-100 mg total) by mouth every 4 (four) hours as needed for moderate pain. What changed: You were already taking a medication with the same name, and this prescription was added. Make sure you understand how  and when to take each.   traZODone 50 MG tablet Commonly known as: DESYREL Take 1 tablet (50 mg total) by mouth daily. What changed: when to take this   Trulicity 3 UH/0.4EQ Sopn Generic drug: Dulaglutide Inject 3 mg into the skin once a week. What changed: additional instructions               Durable Medical Equipment  (From admission, onward)           Start     Ordered   08/23/22 1238  DME Walker rolling  Once       Question:  Patient needs a walker to treat with the following condition  Answer:  Total knee replacement status   08/23/22 1238   08/23/22 1238  DME Bedside commode  Once       Question:  Patient needs a bedside commode to treat with the following condition  Answer:  Total knee replacement status   08/23/22 1238            Disposition: Home with home health PT     Follow-up Information     Fausto Skillern, PA-C Follow up on 09/07/2022.   Specialty: Orthopedic Surgery Why: at 10:45am Contact information: [place] [address] [phone]         Dereck Leep, MD Follow up on 10/05/2022.   Specialty: Orthopedic Surgery Why: at 1:45pm Contact information: [place] [address] [place], PA-C 08/24/2022, 9:56 AM

## 2022-08-24 NOTE — Evaluation (Signed)
Occupational Therapy Evaluation Patient Details Name: Jody Wang MRN: [id number] DOB: September 02, 1946 Today's Date: 08/24/2022   History of Present Illness Pt is a 76 yo F diagnosed with degenerative arthrosis of the left knee and is s/p elective L TKA. PMH includes DM, depression, GERD, CAD, R TKA in May, 2023, and PVD.   Clinical Impression   Patient presenting with decreased independence in self-care, functional mobility, and safety. Patient asleep upon arrival, not easily aroused for therapy (lack of sleep) . Once aroused patient agreeable to OT treatment. Reporting she lives with her son and will have 24/7 assistance at discharge. Patient has a walk-in shower, built-in bench, comfort height toilet, SPC, and RW. At baseline patient, patient did not use any AD unless ambulating long distances in the community. Patient currently functioning at mod independent for bed mobility and transfer sit <> stand using RW. Patient was able to perform UB and LB dressing tasks with supervision. Patient was educated on WB precaution, LB dressing techniques, polar care system, and other AD that can be used if needed. Patient verbalized understanding and handout given. Patient left in bed with call bell in reach and all needs met. No OT follow up recommended at this time. OT to complete order.      Recommendations for follow up therapy are one component of a multi-disciplinary discharge planning process, led by the attending physician.  Recommendations may be updated based on patient status, additional functional criteria and insurance authorization.   Follow Up Recommendations  No OT follow up    Assistance Recommended at Discharge Intermittent Supervision/Assistance  Patient can return home with the following A little help with walking and/or transfers;A little help with bathing/dressing/bathroom;Assist for transportation;Help with stairs or ramp for entrance    Functional Status Assessment  Patient  has had a recent decline in their functional status and demonstrates the ability to make significant improvements in function in a reasonable and predictable amount of time.  Equipment Recommendations  None recommended by OT       Precautions / Restrictions Precautions Precautions: Fall Restrictions Weight Bearing Restrictions: Yes LLE Weight Bearing: Weight bearing as tolerated      Mobility Bed Mobility Overal bed mobility: Modified Independent             General bed mobility comments: Min extra time, effort, and use of bed rails required    Transfers Overall transfer level: Modified independent Equipment used: Rolling walker (2 wheels) Transfers: Sit to/from Stand Sit to Stand: Modified independent (Device/Increase time)                  Balance Overall balance assessment: Modified Independent   Sitting balance-Leahy Scale: Normal     Standing balance support: Bilateral upper extremity supported, During functional activity Standing balance-Leahy Scale: Good                             ADL either performed or assessed with clinical judgement   ADL Overall ADL's : Needs assistance/impaired                 Upper Body Dressing : Supervision/safety   Lower Body Dressing: Supervision/safety               Functional mobility during ADLs: Supervision/safety;Rolling walker (2 wheels)       Vision Patient Visual Report: No change from baseline              Pertinent  Vitals/Pain Pain Assessment Pain Assessment: No/denies pain     Hand Dominance Right   Extremity/Trunk Assessment Upper Extremity Assessment Upper Extremity Assessment: Overall WFL for tasks assessed   Lower Extremity Assessment Lower Extremity Assessment: Generalized weakness;LLE deficits/detail       Communication Communication Communication: No difficulties   Cognition Arousal/Alertness: Awake/alert Behavior During Therapy: WFL for tasks  assessed/performed, Restless Overall Cognitive Status: Within Functional Limits for tasks assessed                                 General Comments: Patient asleep upon arrival not easily aroused. Once aroused patient very cooperative.                Home Living Family/patient expects to be discharged to:: Private residence Living Arrangements: Children Available Help at Discharge: Family;Friend(s);Available 24 hours/day Type of Home: House Home Access: Level entry     Home Layout: One level     Bathroom Shower/Tub: Occupational psychologist: Handicapped height     Home Equipment: [place] - single point;Shower seat - built Agricultural consultant (2 wheels)          Prior Functioning/Environment Prior Level of Function : Independent/Modified Independent             Mobility Comments: Ind amb without an AD limited community distances only secondary to L knee pain, no fall history ADLs Comments: Ind with ADLs                 OT Goals(Current goals can be found in the care plan section) Acute Rehab OT Goals Patient Stated Goal: to go to sleep OT Goal Formulation: With patient Time For Goal Achievement: 08/24/22 Potential to Achieve Goals: Good   AM-PAC OT "6 Clicks" Daily Activity     Outcome Measure Help from another person eating meals?: None Help from another person taking care of personal grooming?: None Help from another person toileting, which includes using toliet, bedpan, or urinal?: A Little Help from another person bathing (including washing, rinsing, drying)?: A Little Help from another person to put on and taking off regular upper body clothing?: None Help from another person to put on and taking off regular lower body clothing?: A Little 6 Click Score: 21   End of Session Equipment Utilized During Treatment: Rolling walker (2 wheels) Nurse Communication: Mobility status  Activity Tolerance: Patient tolerated treatment  well Patient left: in bed;with call bell/phone within reach                   Time: 1013-1029 OT Time Calculation (min): 16 min Charges:  OT General Charges $OT Visit: 1 Visit OT Evaluation $OT Eval Low Complexity: 1 Low    Kaylei Frink, OTS 08/24/2022, 10:38 AM

## 2022-08-24 NOTE — Progress Notes (Signed)
  Subjective: 1 Day Post-Op Procedure(s) (LRB): COMPUTER ASSISTED TOTAL KNEE ARTHROPLASY (Left) Patient reports pain as well-controlled.   Patient is well, and has had no acute complaints or problems Plan is to go Home after hospital stay. Negative for chest pain and shortness of breath Fever: no Gastrointestinal: negative for nausea and vomiting.   Patient has not had a bowel movement.  Objective: Vital signs in last 24 hours: Temp:  [97.5 F (36.4 C)-98.6 F (37 C)] 97.8 F (36.6 C) (10/19 0500) Pulse Rate:  [65-99] 88 (10/19 0500) Resp:  [10-22] 16 (10/19 0500) BP: (102-124)/(49-84) 115/56 (10/19 0500) SpO2:  [94 %-98 %] 97 % (10/19 0500)  Intake/Output from previous day:  Intake/Output Summary (Last 24 hours) at 08/24/2022 0909 Last data filed at 08/24/2022 0600 Gross per 24 hour  Intake 1900.21 ml  Output 1355 ml  Net 545.21 ml    Intake/Output this shift: No intake/output data recorded.  Labs: No results for input(s): "HGB" in the last 72 hours. No results for input(s): "WBC", "RBC", "HCT", "PLT" in the last 72 hours. Recent Labs    08/23/22 1719  GLUCOSE 447*   No results for input(s): "LABPT", "INR" in the last 72 hours.   EXAM General - Patient is Alert, Appropriate, and Oriented Extremity - Neurovascular intact Dorsiflexion/Plantar flexion intact Compartment soft Dressing/Incision -Postoperative dressing remains in place., Polar Care in place and working. , Hemovac in place. , Following removal of post-op dressing, mild bloody drainage noted  Motor Function - intact, moving foot and toes well on exam. Able to perform independent SLR.  Cardiovascular- Regular rate and rhythm, no murmurs/rubs/gallops Respiratory- Lungs clear to auscultation bilaterally Gastrointestinal- soft, nontender, and active bowel sounds   Assessment/Plan: 1 Day Post-Op Procedure(s) (LRB): COMPUTER ASSISTED TOTAL KNEE ARTHROPLASY (Left) Principal Problem:   Total knee  replacement status  Estimated body mass index is 24.07 kg/m as calculated from the following:   Height as of this encounter: '5\' 2"'$  (1.575 m).   Weight as of this encounter: 59.7 kg. Advance diet Up with therapy  Anticipate d/c later today pending completion of PT goals .  Post-op dressing removed. , Hemovac removed., Mini compression dressing applied. , and Fresh honeycomb dressing applied.   DVT Prophylaxis - Lovenox, Ted hose, and SCDs Weight-Bearing as tolerated to left leg  Cassell Smiles, PA-C [facility] 08/24/2022, 9:09 AM

## 2022-08-24 NOTE — Plan of Care (Signed)
Ready for discharge

## 2022-08-24 NOTE — Inpatient Diabetes Management (Signed)
Inpatient Diabetes Program Recommendations  AACE/ADA: New Consensus Statement on Inpatient Glycemic Control   Target Ranges:  Prepandial:   less than 140 mg/dL      Peak postprandial:   less than 180 mg/dL (1-2 hours)      Critically ill patients:  140 - 180 mg/dL    Latest Reference Range & Units 08/23/22 06:18 08/23/22 11:09 08/23/22 12:42 08/23/22 17:00 08/23/22 21:37 08/24/22 08:06  Glucose-Capillary 70 - 99 mg/dL 267 (H) 265 (H) 332 (H) 401 (H) 332 (H) 271 (H)   Review of Glycemic Control  Diabetes history: DM2 Outpatient Diabetes medications: Trulicity 3 mg Qweek (Monday), Metformin XR 500 mg BID Current orders for Inpatient glycemic control: Novolog 0-15 units TID with meals, Novolog 0-5 units QHS, Metformin XR 500 mg BID  Inpatient Diabetes Program Recommendations:    Insulin: Patient received Decadron 8 mg at 6:41 am on 08/23/22 which is contributing to hyperglycemia. Please consider ordering one time Semglee 6 units x1 now (based on 59.7 kg x 0.1 units).  NOTE: Patient admitted following knee surgery on 08/23/22. Glucose up to 401 mg/dl on 08/23/22. Anticipate hyperglycemia due to Decadron patient received on 08/23/22.   Thanks, Barnie Alderman, RN, MSN, [place] Diabetes Coordinator Inpatient Diabetes Program [phone] (Team Pager from 8am to [place])

## 2022-08-24 NOTE — Progress Notes (Signed)
Physical Therapy Treatment Patient Details Name: Jody Wang MRN: [id number] DOB: 1946-07-21 Today's Date: 08/24/2022   History of Present Illness Pt is a 76 yo F diagnosed with degenerative arthrosis of the left knee and is s/p elective L TKA. PMH includes DM, depression, GERD, CAD, R TKA in May, 2023, and PVD.    PT Comments    Pt was seated EOB finishing breakfast upon arriving. She is A and O x 4 but does endorse lack of sleep previous night. She was easily and safely able to stand to RW, ambulate + perform stairs without physical assistance. Once returned to room, perform AROM + exercises handout. Pt did not have any difficulty. She is progressing well and presenting with good AROM already. Pt is cleared from an acute PT standpoint for safe DC home with HHPT to continue to maximize her independence with ADLs.    Recommendations for follow up therapy are one component of a multi-disciplinary discharge planning process, led by the attending physician.  Recommendations may be updated based on patient status, additional functional criteria and insurance authorization.  Follow Up Recommendations  Home health PT     Assistance Recommended at Discharge Set up Supervision/Assistance  Patient can return home with the following A little help with walking and/or transfers;A little help with bathing/dressing/bathroom;Assist for transportation;Assistance with cooking/housework   Equipment Recommendations  None recommended by PT       Precautions / Restrictions Precautions Precautions: Fall Restrictions Weight Bearing Restrictions: Yes LLE Weight Bearing: Weight bearing as tolerated     Mobility  Bed Mobility Overal bed mobility: Modified Independent  Transfers Overall transfer level: Modified independent Equipment used: Rolling walker (2 wheels) Transfers: Sit to/from Stand Sit to Stand: Modified independent (Device/Increase time)   Ambulation/Gait Ambulation/Gait  assistance: Supervision Gait Distance (Feet): 200 Feet Assistive device: Rolling walker (2 wheels) Gait Pattern/deviations: Step-to pattern, Step-through pattern, Antalgic Gait velocity: decreased     General Gait Details: Pt was able to safely ambulate 200 ft with RW. Vcs for improved gait sequencing however overall stable. Vcs for posture and to attempt step through gait pattern   Stairs Stairs: Yes Stairs assistance: Supervision Stair Management: Two rails, Step to pattern, Forwards Number of Stairs: 4 General stair comments: Pt demonstrated safe ability to ascend/descend FOS without physical assistance. Perform with step to gait pattern   Balance Overall balance assessment: Modified Independent     Cognition Arousal/Alertness: Awake/alert Behavior During Therapy: WFL for tasks assessed/performed Overall Cognitive Status: Within Functional Limits for tasks assessed    General Comments: Pt is A and O x 4        Exercises Total Joint Exercises Ankle Circles/Pumps: AROM, 10 reps Quad Sets: AROM, 10 reps Heel Slides: AROM, Seated, 10 reps Hip ABduction/ADduction: AROM, 10 reps Straight Leg Raises: AROM, 10 reps Knee Flexion: AROM, 5 reps, Seated Goniometric ROM: ~2-92        Pertinent Vitals/Pain Pain Assessment Pain Assessment: 0-10 Pain Score: 8  Pain Location: L knee Pain Descriptors / Indicators: Sore, Aching Pain Intervention(s): Limited activity within patient's tolerance, Premedicated before session, Monitored during session, Repositioned, Ice applied     PT Goals (current goals can now be found in the care plan section) Acute Rehab PT Goals Patient Stated Goal: go home Progress towards PT goals: Progressing toward goals    Frequency    BID      PT Plan Current plan remains appropriate       AM-PAC PT "6 Clicks" Mobility  Outcome Measure  Help needed turning from your back to your side while in a flat bed without using bedrails?: A  Little Help needed moving from lying on your back to sitting on the side of a flat bed without using bedrails?: A Little Help needed moving to and from a bed to a chair (including a wheelchair)?: A Little Help needed standing up from a chair using your arms (e.g., wheelchair or bedside chair)?: A Little Help needed to walk in hospital room?: A Little Help needed climbing 3-5 steps with a railing? : A Little 6 Click Score: 18    End of Session   Activity Tolerance: Patient tolerated treatment well Patient left: in bed;with call bell/phone within reach;with SCD's reapplied (bone foam in place) Nurse Communication: Mobility status PT Visit Diagnosis: Other abnormalities of gait and mobility (R26.89);Muscle weakness (generalized) (M62.81);Pain Pain - Right/Left: Left Pain - part of body: Knee     Time: 4818-5631 PT Time Calculation (min) (ACUTE ONLY): 24 min  Charges:  $Gait Training: 8-22 mins $Therapeutic Exercise: 8-22 mins                     Julaine Fusi PTA 08/24/22, 9:23 AM

## 2022-08-25 ENCOUNTER — Telehealth: Payer: Self-pay | Admitting: Internal Medicine

## 2022-08-25 ENCOUNTER — Other Ambulatory Visit: Payer: Self-pay

## 2022-08-25 DIAGNOSIS — Z7901 Long term (current) use of anticoagulants: Secondary | ICD-10-CM | POA: Diagnosis not present

## 2022-08-25 DIAGNOSIS — Z471 Aftercare following joint replacement surgery: Secondary | ICD-10-CM | POA: Diagnosis not present

## 2022-08-25 DIAGNOSIS — Z96652 Presence of left artificial knee joint: Secondary | ICD-10-CM | POA: Diagnosis not present

## 2022-08-25 DIAGNOSIS — Z9181 History of falling: Secondary | ICD-10-CM | POA: Diagnosis not present

## 2022-08-25 DIAGNOSIS — K219 Gastro-esophageal reflux disease without esophagitis: Secondary | ICD-10-CM | POA: Diagnosis not present

## 2022-08-25 DIAGNOSIS — E1151 Type 2 diabetes mellitus with diabetic peripheral angiopathy without gangrene: Secondary | ICD-10-CM | POA: Diagnosis not present

## 2022-08-25 DIAGNOSIS — Z7985 Long-term (current) use of injectable non-insulin antidiabetic drugs: Secondary | ICD-10-CM | POA: Diagnosis not present

## 2022-08-25 DIAGNOSIS — Z79899 Other long term (current) drug therapy: Secondary | ICD-10-CM | POA: Diagnosis not present

## 2022-08-25 DIAGNOSIS — Z7984 Long term (current) use of oral hypoglycemic drugs: Secondary | ICD-10-CM | POA: Diagnosis not present

## 2022-08-25 DIAGNOSIS — Z96651 Presence of right artificial knee joint: Secondary | ICD-10-CM | POA: Diagnosis not present

## 2022-08-25 DIAGNOSIS — I2510 Atherosclerotic heart disease of native coronary artery without angina pectoris: Secondary | ICD-10-CM | POA: Diagnosis not present

## 2022-08-25 DIAGNOSIS — E039 Hypothyroidism, unspecified: Secondary | ICD-10-CM | POA: Diagnosis not present

## 2022-08-25 DIAGNOSIS — F32A Depression, unspecified: Secondary | ICD-10-CM | POA: Diagnosis not present

## 2022-08-25 DIAGNOSIS — H9192 Unspecified hearing loss, left ear: Secondary | ICD-10-CM | POA: Diagnosis not present

## 2022-08-25 DIAGNOSIS — Z1231 Encounter for screening mammogram for malignant neoplasm of breast: Secondary | ICD-10-CM

## 2022-08-25 NOTE — Telephone Encounter (Signed)
Received notification that she is overdue mammogram.  Needs to schedule if pt agreeable.

## 2022-08-25 NOTE — Telephone Encounter (Signed)
I spoke with patient & she asked that I call to scheduled mammogram but when I call back just LM with date/time. I called LM that mammo scheduled 10/11/22 at 2:20p. [place]'s number provided if that date did not work.

## 2022-08-27 DIAGNOSIS — Z9181 History of falling: Secondary | ICD-10-CM | POA: Diagnosis not present

## 2022-08-27 DIAGNOSIS — Z79899 Other long term (current) drug therapy: Secondary | ICD-10-CM | POA: Diagnosis not present

## 2022-08-27 DIAGNOSIS — Z96652 Presence of left artificial knee joint: Secondary | ICD-10-CM | POA: Diagnosis not present

## 2022-08-27 DIAGNOSIS — K219 Gastro-esophageal reflux disease without esophagitis: Secondary | ICD-10-CM | POA: Diagnosis not present

## 2022-08-27 DIAGNOSIS — Z471 Aftercare following joint replacement surgery: Secondary | ICD-10-CM | POA: Diagnosis not present

## 2022-08-27 DIAGNOSIS — Z7901 Long term (current) use of anticoagulants: Secondary | ICD-10-CM | POA: Diagnosis not present

## 2022-08-27 DIAGNOSIS — E1151 Type 2 diabetes mellitus with diabetic peripheral angiopathy without gangrene: Secondary | ICD-10-CM | POA: Diagnosis not present

## 2022-08-27 DIAGNOSIS — F32A Depression, unspecified: Secondary | ICD-10-CM | POA: Diagnosis not present

## 2022-08-27 DIAGNOSIS — Z7985 Long-term (current) use of injectable non-insulin antidiabetic drugs: Secondary | ICD-10-CM | POA: Diagnosis not present

## 2022-08-27 DIAGNOSIS — E039 Hypothyroidism, unspecified: Secondary | ICD-10-CM | POA: Diagnosis not present

## 2022-08-27 DIAGNOSIS — H9192 Unspecified hearing loss, left ear: Secondary | ICD-10-CM | POA: Diagnosis not present

## 2022-08-27 DIAGNOSIS — I2510 Atherosclerotic heart disease of native coronary artery without angina pectoris: Secondary | ICD-10-CM | POA: Diagnosis not present

## 2022-08-27 DIAGNOSIS — Z96651 Presence of right artificial knee joint: Secondary | ICD-10-CM | POA: Diagnosis not present

## 2022-08-27 DIAGNOSIS — Z7984 Long term (current) use of oral hypoglycemic drugs: Secondary | ICD-10-CM | POA: Diagnosis not present

## 2022-08-29 ENCOUNTER — Telehealth: Payer: Self-pay

## 2022-08-29 DIAGNOSIS — Z9181 History of falling: Secondary | ICD-10-CM | POA: Diagnosis not present

## 2022-08-29 DIAGNOSIS — F32A Depression, unspecified: Secondary | ICD-10-CM | POA: Diagnosis not present

## 2022-08-29 DIAGNOSIS — Z471 Aftercare following joint replacement surgery: Secondary | ICD-10-CM | POA: Diagnosis not present

## 2022-08-29 DIAGNOSIS — Z7984 Long term (current) use of oral hypoglycemic drugs: Secondary | ICD-10-CM | POA: Diagnosis not present

## 2022-08-29 DIAGNOSIS — Z96652 Presence of left artificial knee joint: Secondary | ICD-10-CM | POA: Diagnosis not present

## 2022-08-29 DIAGNOSIS — Z7901 Long term (current) use of anticoagulants: Secondary | ICD-10-CM | POA: Diagnosis not present

## 2022-08-29 DIAGNOSIS — H9192 Unspecified hearing loss, left ear: Secondary | ICD-10-CM | POA: Diagnosis not present

## 2022-08-29 DIAGNOSIS — Z79899 Other long term (current) drug therapy: Secondary | ICD-10-CM | POA: Diagnosis not present

## 2022-08-29 DIAGNOSIS — Z96651 Presence of right artificial knee joint: Secondary | ICD-10-CM | POA: Diagnosis not present

## 2022-08-29 DIAGNOSIS — E039 Hypothyroidism, unspecified: Secondary | ICD-10-CM | POA: Diagnosis not present

## 2022-08-29 DIAGNOSIS — K219 Gastro-esophageal reflux disease without esophagitis: Secondary | ICD-10-CM | POA: Diagnosis not present

## 2022-08-29 DIAGNOSIS — I2510 Atherosclerotic heart disease of native coronary artery without angina pectoris: Secondary | ICD-10-CM | POA: Diagnosis not present

## 2022-08-29 DIAGNOSIS — E1151 Type 2 diabetes mellitus with diabetic peripheral angiopathy without gangrene: Secondary | ICD-10-CM | POA: Diagnosis not present

## 2022-08-29 DIAGNOSIS — Z7985 Long-term (current) use of injectable non-insulin antidiabetic drugs: Secondary | ICD-10-CM | POA: Diagnosis not present

## 2022-08-29 NOTE — Progress Notes (Signed)
[facility])  [place] Team    08/29/2022  OLEVIA WESTERVELT 02/04/1946 [id number]  Reason for referral: Medication Assistance Re-enrollment  Referral source:  [place] Technician  Current insurance: [facility]  Outreach:  Unsuccessful telephone call with Ms. Ovid Curd.  Three HIPAA compliant voicemails left on 10/2, 10/12, and 10/24. Outreach was attempted to re-enroll the patient in medication assistance for Trulicity.   [facility] pharmacy case is being closed due to the following reasons:  -Will close [place] case as I have been unable to engage or maintain contact with patient   [facility] is happy to assist the patient/family in the future for clinical pharmacy needs, following a discussion from your team about [place] outreach. Thank you for allowing [facility] to be a part of your patient's care.   Thanks,  Reed Breech, [place] [phone]

## 2022-08-30 DIAGNOSIS — Z9181 History of falling: Secondary | ICD-10-CM | POA: Diagnosis not present

## 2022-08-30 DIAGNOSIS — Z79899 Other long term (current) drug therapy: Secondary | ICD-10-CM | POA: Diagnosis not present

## 2022-08-30 DIAGNOSIS — H9192 Unspecified hearing loss, left ear: Secondary | ICD-10-CM | POA: Diagnosis not present

## 2022-08-30 DIAGNOSIS — Z7985 Long-term (current) use of injectable non-insulin antidiabetic drugs: Secondary | ICD-10-CM | POA: Diagnosis not present

## 2022-08-30 DIAGNOSIS — K219 Gastro-esophageal reflux disease without esophagitis: Secondary | ICD-10-CM | POA: Diagnosis not present

## 2022-08-30 DIAGNOSIS — I2510 Atherosclerotic heart disease of native coronary artery without angina pectoris: Secondary | ICD-10-CM | POA: Diagnosis not present

## 2022-08-30 DIAGNOSIS — E1151 Type 2 diabetes mellitus with diabetic peripheral angiopathy without gangrene: Secondary | ICD-10-CM | POA: Diagnosis not present

## 2022-08-30 DIAGNOSIS — Z7984 Long term (current) use of oral hypoglycemic drugs: Secondary | ICD-10-CM | POA: Diagnosis not present

## 2022-08-30 DIAGNOSIS — Z471 Aftercare following joint replacement surgery: Secondary | ICD-10-CM | POA: Diagnosis not present

## 2022-08-30 DIAGNOSIS — E039 Hypothyroidism, unspecified: Secondary | ICD-10-CM | POA: Diagnosis not present

## 2022-08-30 DIAGNOSIS — Z96651 Presence of right artificial knee joint: Secondary | ICD-10-CM | POA: Diagnosis not present

## 2022-08-30 DIAGNOSIS — Z96652 Presence of left artificial knee joint: Secondary | ICD-10-CM | POA: Diagnosis not present

## 2022-08-30 DIAGNOSIS — Z7901 Long term (current) use of anticoagulants: Secondary | ICD-10-CM | POA: Diagnosis not present

## 2022-08-30 DIAGNOSIS — F32A Depression, unspecified: Secondary | ICD-10-CM | POA: Diagnosis not present

## 2022-09-01 DIAGNOSIS — Z7985 Long-term (current) use of injectable non-insulin antidiabetic drugs: Secondary | ICD-10-CM | POA: Diagnosis not present

## 2022-09-01 DIAGNOSIS — H9192 Unspecified hearing loss, left ear: Secondary | ICD-10-CM | POA: Diagnosis not present

## 2022-09-01 DIAGNOSIS — Z96652 Presence of left artificial knee joint: Secondary | ICD-10-CM | POA: Diagnosis not present

## 2022-09-01 DIAGNOSIS — E1151 Type 2 diabetes mellitus with diabetic peripheral angiopathy without gangrene: Secondary | ICD-10-CM | POA: Diagnosis not present

## 2022-09-01 DIAGNOSIS — I2510 Atherosclerotic heart disease of native coronary artery without angina pectoris: Secondary | ICD-10-CM | POA: Diagnosis not present

## 2022-09-01 DIAGNOSIS — F32A Depression, unspecified: Secondary | ICD-10-CM | POA: Diagnosis not present

## 2022-09-01 DIAGNOSIS — E039 Hypothyroidism, unspecified: Secondary | ICD-10-CM | POA: Diagnosis not present

## 2022-09-01 DIAGNOSIS — Z7984 Long term (current) use of oral hypoglycemic drugs: Secondary | ICD-10-CM | POA: Diagnosis not present

## 2022-09-01 DIAGNOSIS — K219 Gastro-esophageal reflux disease without esophagitis: Secondary | ICD-10-CM | POA: Diagnosis not present

## 2022-09-01 DIAGNOSIS — Z7901 Long term (current) use of anticoagulants: Secondary | ICD-10-CM | POA: Diagnosis not present

## 2022-09-01 DIAGNOSIS — Z96651 Presence of right artificial knee joint: Secondary | ICD-10-CM | POA: Diagnosis not present

## 2022-09-01 DIAGNOSIS — Z79899 Other long term (current) drug therapy: Secondary | ICD-10-CM | POA: Diagnosis not present

## 2022-09-01 DIAGNOSIS — Z471 Aftercare following joint replacement surgery: Secondary | ICD-10-CM | POA: Diagnosis not present

## 2022-09-01 DIAGNOSIS — Z9181 History of falling: Secondary | ICD-10-CM | POA: Diagnosis not present

## 2022-09-04 ENCOUNTER — Encounter (INDEPENDENT_AMBULATORY_CARE_PROVIDER_SITE_OTHER): Payer: Self-pay

## 2022-09-04 DIAGNOSIS — F32A Depression, unspecified: Secondary | ICD-10-CM | POA: Diagnosis not present

## 2022-09-04 DIAGNOSIS — H9192 Unspecified hearing loss, left ear: Secondary | ICD-10-CM | POA: Diagnosis not present

## 2022-09-04 DIAGNOSIS — K219 Gastro-esophageal reflux disease without esophagitis: Secondary | ICD-10-CM | POA: Diagnosis not present

## 2022-09-04 DIAGNOSIS — E039 Hypothyroidism, unspecified: Secondary | ICD-10-CM | POA: Diagnosis not present

## 2022-09-04 DIAGNOSIS — Z7985 Long-term (current) use of injectable non-insulin antidiabetic drugs: Secondary | ICD-10-CM | POA: Diagnosis not present

## 2022-09-04 DIAGNOSIS — Z471 Aftercare following joint replacement surgery: Secondary | ICD-10-CM | POA: Diagnosis not present

## 2022-09-04 DIAGNOSIS — I2510 Atherosclerotic heart disease of native coronary artery without angina pectoris: Secondary | ICD-10-CM | POA: Diagnosis not present

## 2022-09-04 DIAGNOSIS — Z9181 History of falling: Secondary | ICD-10-CM | POA: Diagnosis not present

## 2022-09-04 DIAGNOSIS — Z96651 Presence of right artificial knee joint: Secondary | ICD-10-CM | POA: Diagnosis not present

## 2022-09-04 DIAGNOSIS — Z7901 Long term (current) use of anticoagulants: Secondary | ICD-10-CM | POA: Diagnosis not present

## 2022-09-04 DIAGNOSIS — Z96652 Presence of left artificial knee joint: Secondary | ICD-10-CM | POA: Diagnosis not present

## 2022-09-04 DIAGNOSIS — Z7984 Long term (current) use of oral hypoglycemic drugs: Secondary | ICD-10-CM | POA: Diagnosis not present

## 2022-09-04 DIAGNOSIS — Z79899 Other long term (current) drug therapy: Secondary | ICD-10-CM | POA: Diagnosis not present

## 2022-09-04 DIAGNOSIS — E1151 Type 2 diabetes mellitus with diabetic peripheral angiopathy without gangrene: Secondary | ICD-10-CM | POA: Diagnosis not present

## 2022-09-05 DIAGNOSIS — Z9181 History of falling: Secondary | ICD-10-CM | POA: Diagnosis not present

## 2022-09-05 DIAGNOSIS — Z96651 Presence of right artificial knee joint: Secondary | ICD-10-CM | POA: Diagnosis not present

## 2022-09-05 DIAGNOSIS — E1151 Type 2 diabetes mellitus with diabetic peripheral angiopathy without gangrene: Secondary | ICD-10-CM | POA: Diagnosis not present

## 2022-09-05 DIAGNOSIS — Z7984 Long term (current) use of oral hypoglycemic drugs: Secondary | ICD-10-CM | POA: Diagnosis not present

## 2022-09-05 DIAGNOSIS — Z7901 Long term (current) use of anticoagulants: Secondary | ICD-10-CM | POA: Diagnosis not present

## 2022-09-05 DIAGNOSIS — K219 Gastro-esophageal reflux disease without esophagitis: Secondary | ICD-10-CM | POA: Diagnosis not present

## 2022-09-05 DIAGNOSIS — Z79899 Other long term (current) drug therapy: Secondary | ICD-10-CM | POA: Diagnosis not present

## 2022-09-05 DIAGNOSIS — I2510 Atherosclerotic heart disease of native coronary artery without angina pectoris: Secondary | ICD-10-CM | POA: Diagnosis not present

## 2022-09-05 DIAGNOSIS — E039 Hypothyroidism, unspecified: Secondary | ICD-10-CM | POA: Diagnosis not present

## 2022-09-05 DIAGNOSIS — F32A Depression, unspecified: Secondary | ICD-10-CM | POA: Diagnosis not present

## 2022-09-05 DIAGNOSIS — H9192 Unspecified hearing loss, left ear: Secondary | ICD-10-CM | POA: Diagnosis not present

## 2022-09-05 DIAGNOSIS — Z471 Aftercare following joint replacement surgery: Secondary | ICD-10-CM | POA: Diagnosis not present

## 2022-09-05 DIAGNOSIS — Z7985 Long-term (current) use of injectable non-insulin antidiabetic drugs: Secondary | ICD-10-CM | POA: Diagnosis not present

## 2022-09-05 DIAGNOSIS — Z96652 Presence of left artificial knee joint: Secondary | ICD-10-CM | POA: Diagnosis not present

## 2022-09-07 DIAGNOSIS — M25562 Pain in left knee: Secondary | ICD-10-CM | POA: Diagnosis not present

## 2022-09-07 DIAGNOSIS — Z96652 Presence of left artificial knee joint: Secondary | ICD-10-CM | POA: Diagnosis not present

## 2022-09-15 DIAGNOSIS — Z96652 Presence of left artificial knee joint: Secondary | ICD-10-CM | POA: Diagnosis not present

## 2022-09-15 DIAGNOSIS — M25562 Pain in left knee: Secondary | ICD-10-CM | POA: Diagnosis not present

## 2022-09-19 DIAGNOSIS — Z96652 Presence of left artificial knee joint: Secondary | ICD-10-CM | POA: Diagnosis not present

## 2022-09-19 DIAGNOSIS — M25562 Pain in left knee: Secondary | ICD-10-CM | POA: Diagnosis not present

## 2022-09-21 DIAGNOSIS — Z96652 Presence of left artificial knee joint: Secondary | ICD-10-CM | POA: Diagnosis not present

## 2022-09-21 DIAGNOSIS — M25562 Pain in left knee: Secondary | ICD-10-CM | POA: Diagnosis not present

## 2022-09-22 ENCOUNTER — Other Ambulatory Visit: Payer: Self-pay | Admitting: Internal Medicine

## 2022-09-22 DIAGNOSIS — J302 Other seasonal allergic rhinitis: Secondary | ICD-10-CM

## 2022-09-25 DIAGNOSIS — Z96652 Presence of left artificial knee joint: Secondary | ICD-10-CM | POA: Diagnosis not present

## 2022-09-27 DIAGNOSIS — Z96652 Presence of left artificial knee joint: Secondary | ICD-10-CM | POA: Diagnosis not present

## 2022-09-27 DIAGNOSIS — M25562 Pain in left knee: Secondary | ICD-10-CM | POA: Diagnosis not present

## 2022-10-02 DIAGNOSIS — M25562 Pain in left knee: Secondary | ICD-10-CM | POA: Diagnosis not present

## 2022-10-02 DIAGNOSIS — Z96652 Presence of left artificial knee joint: Secondary | ICD-10-CM | POA: Diagnosis not present

## 2022-10-04 DIAGNOSIS — Z471 Aftercare following joint replacement surgery: Secondary | ICD-10-CM | POA: Diagnosis not present

## 2022-10-05 ENCOUNTER — Other Ambulatory Visit: Payer: Medicare Other

## 2022-10-05 DIAGNOSIS — Z96652 Presence of left artificial knee joint: Secondary | ICD-10-CM | POA: Diagnosis not present

## 2022-10-11 ENCOUNTER — Ambulatory Visit: Payer: Medicare Other | Admitting: Internal Medicine

## 2022-10-20 ENCOUNTER — Ambulatory Visit (INDEPENDENT_AMBULATORY_CARE_PROVIDER_SITE_OTHER): Payer: Medicare Other | Admitting: Family

## 2022-10-20 ENCOUNTER — Encounter: Payer: Self-pay | Admitting: Family

## 2022-10-20 VITALS — BP 128/82 | HR 86 | Temp 97.7°F

## 2022-10-20 DIAGNOSIS — U071 COVID-19: Secondary | ICD-10-CM | POA: Diagnosis not present

## 2022-10-20 MED ORDER — BENZONATATE 100 MG PO CAPS: 100.0000 mg | ORAL_CAPSULE | Freq: Three times a day (TID) | ORAL | 1 refills | Status: DC | PRN

## 2022-10-20 MED ORDER — MOLNUPIRAVIR EUA 200MG CAPSULE: 4.0000 | ORAL_CAPSULE | Freq: Two times a day (BID) | ORAL | 0 refills | Status: AC

## 2022-10-20 MED ORDER — MOLNUPIRAVIR EUA 200MG CAPSULE: 4.0000 | ORAL_CAPSULE | Freq: Two times a day (BID) | ORAL | 0 refills | Status: DC

## 2022-10-20 NOTE — Patient Instructions (Signed)
You may start PLAIN Mucinex ( guaifenesin) which you can help break up thick congestion.  Please ensure you are drinking plenty of water with this medication.  Please stay in quarantine per cdc guidelines.   If you test positive for COVID-19, stay home for at least 5 days and isolate from others in your home. You are likely most infectious during these first 5 days. Wear a high-quality mask if you must be around others at home and in public. Do not go places where you are unable to wear a mask.   We discussed starting Molnupiravir which is an unapproved drug that is authorized for use under an Emergency Use Authorization.  There are no adequate, approved, available products for the treatment of COVID-19 in adults who have mild-to-moderate COVID-19 and are at high risk for progressing to severe COVID-19, including hospitalization or death.  I have sent  Molnupiravir to your pharmacy. Please call pharmacy so they bring medication out to your car and you do not have to go inside.   This medication is not recommended in pregnancy.    COMMON SIDE EFFECTS: Diarrhea Nausea dizziness   If your COVID-19 symptoms get worse, get medical help right away. Call 911 if you experience symptoms such as worsening cough, trouble breathing, chest pain that doesn't go away, confusion, a hard time staying awake, and pale or blue-colored skin.This medication won't prevent all COVID-19 cases from getting worse.   Molnupiravir Oral Capsules What is this medication? MOLNUPIRAVIR (mol nue pir a vir) treats COVID-19. It is an antiviral medication. It may decrease the risk of developing severe symptoms of COVID-19. It may also decrease the chance of going to the hospital. This medication is not approved by the FDA. The FDA has authorized emergency use of thismedication during the COVID-19 pandemic. This medicine may be used for other purposes; ask your health care provider orpharmacist if you have questions. What should  I tell my care team before I take this medication? They need to know if you have any of these conditions: Any allergies Any serious illness An unusual or allergic reaction to molnupiravir, other medications, foods, dyes, or preservatives Pregnant or trying to get pregnant Breast-feeding How should I use this medication? Take this medication by mouth with water. Take it as directed on the prescription label at the same time every day. Do not cut, crush or chew this medication. Swallow the capsules whole. You can take it with or without food. If it upsets your stomach, take it with food. Take all of this medication unless your care team tells you to stop it early. Keep taking it even if youthink you are better. Talk to your care team about the use of this medication in children. Specialcare may be needed. Overdosage: If you think you have taken too much of this medicine contact apoison control center or emergency room at once. NOTE: This medicine is only for you. Do not share this medicine with others. What if I miss a dose? If you miss a dose, take it as soon as you can unless it is more than 10 hours late. If it is more than 10 hours late, skip the missed dose. Take the next dose at the normal time. Do not take extra or 2 doses at the same time to makeup for the missed dose. What may interact with this medication? Interactions have not been studied. This list may not describe all possible interactions. Give your health care provider a list of all the  medicines, herbs, non-prescription drugs, or dietary supplements you use. Also tell them if you smoke, drink alcohol, or use illegaldrugs. Some items may interact with your medicine. What should I watch for while using this medication? Your condition will be monitored carefully while you are receiving this medication. Visit your care team for regular checkups. Tell your care team ifyour symptoms do not start to get better or if they get worse. Do not  become pregnant while taking this medication. You may need a pregnancy test before starting this medication. Women must use a reliable form of birth control while taking this medication and for 4 days after stopping the medication. Women should inform their care team if they wish to become pregnant or think they might be pregnant. Men should not father a child while taking this medication and for 3 months after stopping it. There is potential for serious harm to an unborn child. Talk to your care team for more information. Do not breast-feed an infant while taking this medication and for 4 days afterstopping the medication. What side effects may I notice from receiving this medication? Side effects that you should report to your care team as soon as possible: Allergic reactions-skin rash, itching, hives, swelling of the face, lips, tongue, or throat Side effects that usually do not require medical attention (report these toyour care team if they continue or are bothersome): Diarrhea Dizziness Nausea This list may not describe all possible side effects. Call your doctor for medical advice about side effects. You may report side effects to FDA at1-800-FDA-1088. Where should I keep my medication? Keep out of the reach of children and pets. Store at room temperature between 20 and 25 degrees C (68 and 77 degrees F).Get rid of any unused medication after the expiration date. To get rid of medications that are no longer needed or have expired: Take the medication to a medication take-back program. Check with your pharmacy or law enforcement to find a location. If you cannot return the medication, check the label or package insert to see if the medication should be thrown out in the garbage or flushed down the toilet. If you are not sure, ask your care team. If it is safe to put it in the trash, take the medication out of the container. Mix the medication with cat litter, dirt, coffee grounds, or other  unwanted substance. Seal the mixture in a bag or container. Put it in the trash. NOTE: This sheet is a summary. It may not cover all possible information. If you have questions about this medicine, talk to your doctor, pharmacist, orhealth care provider.  2022 Elsevier/Gold Standard (2020-11-01 16:16:01)

## 2022-10-20 NOTE — Progress Notes (Signed)
Assessment & Plan:  COVID-19 Assessment & Plan: No acute respiratory distress . Afebrile. discussed molnupiravir.I have counseled on lacking long term safely and effectiveness data of medication,molnupiravir.  Explained EUA for molnupiravir. Criteria met for consideration of  Molnupiravir :  covid positive, patient older than 77 years old, started within 5 days of symptom onset and risk factor for severe disease include: age > 65, DM, CAD   Counseled on adverse effects including nausea, dizziness, diarrhea.  Patient is most comfortable and desires to start [place] .    Orders: -     molnupiravir EUA; Take 4 capsules (800 mg total) by mouth 2 (two) times daily for 5 days.  Dispense: 40 capsule; Refill: 0 -     Benzonatate; Take 1 capsule (100 mg total) by mouth 3 (three) times daily as needed for cough.  Dispense: 20 capsule; Refill: 1     Return precautions given.   Risks, benefits, and alternatives of the medications and treatment plan prescribed today were discussed, and patient expressed understanding.   Education regarding symptom management and diagnosis given to patient on AVS either electronically or printed.  No follow-ups on file.  Mable Paris, FNP  Subjective:    Patient ID: Tonia Ghent, adult    DOB: [birth year], 76 y.o.   MRN: [id number]  CC: YANAI HOBSON is a 76 y.o. adult who presents today for an acute visit.    HPI: Complains of cough and congestion which started 4 days ago.  Endorses nasal congestion.   Denies fevers and chills ,shortness of breath wheezing, chest pain, diarrhea Tested positive for COVID yesterday at home.    H/o CAD, DM No h/o CKD S/p hip replacement.   Allergies: Codeine, Meperidine and related, Oxycontin [oxycodone hcl], Ezetimibe, Silicone, Jardiance [empagliflozin], and Tape Current Outpatient Medications on File Prior to Visit  Medication Sig Dispense Refill   acetaminophen (TYLENOL) 500 MG tablet Take 1-2  tablets (500-1,000 mg total) by mouth every 6 (six) hours as needed for mild pain. 60 tablet 0   blood glucose meter kit and supplies KIT Dispense based on patient and insurance preference. Use to check blood sugars twice daily. DX E11.9 1 each 0   celecoxib (CELEBREX) 200 MG capsule Take 1 capsule (200 mg total) by mouth 2 (two) times daily. 60 capsule 0   celecoxib (CELEBREX) 200 MG capsule Take 1 capsule (200 mg total) by mouth 2 (two) times daily. 90 capsule 0   cholecalciferol (VITAMIN D) 1000 units tablet Take 1,000 Units by mouth daily.     Dulaglutide (TRULICITY) 3 HC/6.2BJ SOPN Inject 3 mg into the skin once a week. (Patient taking differently: Inject 3 mg into the skin once a week. Mondays) 8 mL 2   enoxaparin (LOVENOX) 40 MG/0.4ML injection Inject 0.4 mLs (40 mg total) into the skin daily for 14 days. 5.6 mL 0   FLUoxetine (PROZAC) 20 MG capsule TAKE 1 CAPSULE BY MOUTH DAILY 90 capsule 3   fluticasone (FLONASE) 50 MCG/ACT nasal spray USE 2 SPRAYS IN BOTH NOSTRILS  DAILY 64 g 0   glucose blood (ONETOUCH VERIO) test strip TEST BLOOD SUGAR TWICE DAILY AS DIRECTED dx e11.9 200 strip 3   glucose blood test strip Use as instructed to check blood sugars to check blood sugars twice daily. Dx E11.9 100 each 12   HYDROcodone-acetaminophen (NORCO) 5-325 MG tablet Take 1 tablet by mouth every 4 (four) hours as needed for severe pain. 30 tablet 0   levocetirizine (XYZAL)  5 MG tablet TAKE 1 TABLET BY MOUTH DAILY 80 tablet 0   levothyroxine (SYNTHROID) 88 MCG tablet TAKE 1 TABLET BY MOUTH DAILY 100 tablet 2   metFORMIN (GLUCOPHAGE-XR) 500 MG 24 hr tablet TAKE 1 TABLET BY MOUTH TWICE  DAILY 200 tablet 2   mupirocin ointment (BACTROBAN) 2 % Apply 1 application topically 2 (two) times daily. (Patient not taking: Reported on 08/11/2022) 22 g 0   ondansetron (ZOFRAN) 4 MG tablet Take 1 tablet (4 mg total) by mouth every 6 (six) hours as needed for nausea. (Patient not taking: Reported on 08/11/2022) 30 tablet 0    pantoprazole (PROTONIX) 40 MG tablet Take 1 tablet (40 mg total) by mouth daily. TAKE 1 TABLET BY MOUTH  DAILY 100 tablet 2   pravastatin (PRAVACHOL) 40 MG tablet Take 40 mg by mouth daily.     rosuvastatin (CRESTOR) 40 MG tablet TAKE 1 TABLET BY MOUTH DAILY 100 tablet 2   traMADol (ULTRAM) 50 MG tablet Take 1-2 tablets (50-100 mg total) by mouth every 4 (four) hours as needed for moderate pain. 30 tablet 0   traMADol (ULTRAM) 50 MG tablet Take 1-2 tablets (50-100 mg total) by mouth every 4 (four) hours as needed for moderate pain. 30 tablet 0   traZODone (DESYREL) 50 MG tablet TAKE 1 TABLET BY MOUTH DAILY 90 tablet 3   vitamin B-12 (CYANOCOBALAMIN) 1000 MCG tablet Take 1,000 mcg by mouth daily.     No current facility-administered medications on file prior to visit.    Review of Systems  Constitutional:  Negative for chills and fever.  HENT:  Positive for congestion.   Respiratory:  Positive for cough. Negative for shortness of breath and wheezing.   Cardiovascular:  Negative for chest pain and palpitations.  Gastrointestinal:  Negative for nausea and vomiting.      Objective:    BP 128/82   Pulse 86   Temp 97.7 F (36.5 C) (Oral)   SpO2 96%   BP Readings from Last 3 Encounters:  10/20/22 128/82  08/24/22 (!) 115/56  08/11/22 (!) 143/60   Wt Readings from Last 3 Encounters:  08/23/22 131 lb 9.8 oz (59.7 kg)  08/11/22 131 lb 11.2 oz (59.7 kg)  06/08/22 132 lb (59.9 kg)    Physical Exam Vitals reviewed.  Constitutional:      Appearance: He is well-developed.  HENT:     Head: Normocephalic and atraumatic.     Right Ear: Hearing, tympanic membrane, ear canal and external ear normal. No decreased hearing noted. No drainage, swelling or tenderness. No middle ear effusion. No foreign body. Tympanic membrane is not erythematous or bulging.     Left Ear: Hearing, tympanic membrane, ear canal and external ear normal. No decreased hearing noted. No drainage, swelling or  tenderness.  No middle ear effusion. No foreign body. Tympanic membrane is not erythematous or bulging.     Nose: Nose normal. No rhinorrhea.     Right Sinus: No maxillary sinus tenderness or frontal sinus tenderness.     Left Sinus: No maxillary sinus tenderness or frontal sinus tenderness.     Mouth/Throat:     Pharynx: Uvula midline. No oropharyngeal exudate or posterior oropharyngeal erythema.     Tonsils: No tonsillar abscesses.  Eyes:     Conjunctiva/sclera: Conjunctivae normal.  Cardiovascular:     Rate and Rhythm: Regular rhythm.     Pulses: Normal pulses.     Heart sounds: Normal heart sounds.  Pulmonary:     Effort:  Pulmonary effort is normal.     Breath sounds: Normal breath sounds. No wheezing, rhonchi or rales.  Lymphadenopathy:     Head:     Right side of head: No submental, submandibular, tonsillar, preauricular, posterior auricular or occipital adenopathy.     Left side of head: No submental, submandibular, tonsillar, preauricular, posterior auricular or occipital adenopathy.     Cervical: No cervical adenopathy.  Skin:    General: Skin is warm and dry.  Neurological:     Mental Status: He is alert.  Psychiatric:        Speech: Speech normal.        Behavior: Behavior normal.        Thought Content: Thought content normal.

## 2022-10-20 NOTE — Assessment & Plan Note (Signed)
No acute respiratory distress . Afebrile. discussed molnupiravir.I have counseled on lacking long term safely and effectiveness data of medication,molnupiravir.  Explained EUA for molnupiravir. Criteria met for consideration of  Molnupiravir :  covid positive, patient older than 77 years old, started within 5 days of symptom onset and risk factor for severe disease include: age > 68, DM, CAD   Counseled on adverse effects including nausea, dizziness, diarrhea.  Patient is most comfortable and desires to start [place] .

## 2022-10-24 ENCOUNTER — Ambulatory Visit: Payer: Medicare Other | Admitting: Internal Medicine

## 2022-10-26 ENCOUNTER — Telehealth: Payer: Self-pay

## 2022-10-26 ENCOUNTER — Other Ambulatory Visit (HOSPITAL_COMMUNITY): Payer: Self-pay

## 2022-10-26 NOTE — Telephone Encounter (Signed)
If no allergies or problems taking delsym can try delsym for cough.

## 2022-10-26 NOTE — Telephone Encounter (Signed)
Pharmacy Patient Advocate Encounter   Received notification from [facility] that prior authorization for Benzonatate '100mg'$  is required/requested.  Per Test Claim: Benzonatate '100mg'$   isn't covered. Please Advise

## 2022-10-27 NOTE — Telephone Encounter (Signed)
Pt advised Will have someone pick up for her

## 2022-11-13 ENCOUNTER — Encounter: Payer: Self-pay | Admitting: Internal Medicine

## 2022-11-13 ENCOUNTER — Ambulatory Visit (INDEPENDENT_AMBULATORY_CARE_PROVIDER_SITE_OTHER): Payer: Medicare Other | Admitting: Internal Medicine

## 2022-11-13 VITALS — BP 120/70 | HR 72 | Temp 98.1°F | Resp 14 | Ht 62.0 in | Wt 131.6 lb

## 2022-11-13 DIAGNOSIS — E1165 Type 2 diabetes mellitus with hyperglycemia: Secondary | ICD-10-CM | POA: Diagnosis not present

## 2022-11-13 DIAGNOSIS — M25569 Pain in unspecified knee: Secondary | ICD-10-CM

## 2022-11-13 DIAGNOSIS — E1159 Type 2 diabetes mellitus with other circulatory complications: Secondary | ICD-10-CM

## 2022-11-13 DIAGNOSIS — I700 Atherosclerosis of aorta: Secondary | ICD-10-CM

## 2022-11-13 DIAGNOSIS — D649 Anemia, unspecified: Secondary | ICD-10-CM | POA: Diagnosis not present

## 2022-11-13 DIAGNOSIS — E039 Hypothyroidism, unspecified: Secondary | ICD-10-CM

## 2022-11-13 DIAGNOSIS — K219 Gastro-esophageal reflux disease without esophagitis: Secondary | ICD-10-CM | POA: Diagnosis not present

## 2022-11-13 DIAGNOSIS — I25118 Atherosclerotic heart disease of native coronary artery with other forms of angina pectoris: Secondary | ICD-10-CM

## 2022-11-13 DIAGNOSIS — R9389 Abnormal findings on diagnostic imaging of other specified body structures: Secondary | ICD-10-CM | POA: Diagnosis not present

## 2022-11-13 DIAGNOSIS — F320 Major depressive disorder, single episode, mild: Secondary | ICD-10-CM

## 2022-11-13 DIAGNOSIS — E7800 Pure hypercholesterolemia, unspecified: Secondary | ICD-10-CM

## 2022-11-13 DIAGNOSIS — I779 Disorder of arteries and arterioles, unspecified: Secondary | ICD-10-CM | POA: Diagnosis not present

## 2022-11-13 LAB — LIPID PANEL
Cholesterol: 109 mg/dL (ref 0–200)
HDL: 47.5 mg/dL (ref >39.00)
LDL Cholesterol: 41 mg/dL (ref 0–99)
NonHDL: 61.78
Total CHOL/HDL Ratio: 2
Triglycerides: 106 mg/dL (ref 0.0–149.0)
VLDL: 21.2 mg/dL (ref 0.0–40.0)

## 2022-11-13 LAB — CBC WITH DIFFERENTIAL/PLATELET
Basophils Absolute: 0 10*3/uL (ref 0.0–0.1)
Basophils Relative: 0.3 % (ref 0.0–3.0)
Eosinophils Absolute: 0.3 10*3/uL (ref 0.0–0.7)
Eosinophils Relative: 3.3 % (ref 0.0–5.0)
HCT: 30.5 % — ABNORMAL LOW (ref 36.0–46.0)
Hemoglobin: 9.5 g/dL — ABNORMAL LOW (ref 12.0–15.0)
Lymphocytes Relative: 29.3 % (ref 12.0–46.0)
Lymphs Abs: 2.3 10*3/uL (ref 0.7–4.0)
MCHC: 31.2 g/dL (ref 30.0–36.0)
MCV: 71.5 fl — ABNORMAL LOW (ref 78.0–100.0)
Monocytes Absolute: 0.7 10*3/uL (ref 0.1–1.0)
Monocytes Relative: 9.7 % (ref 3.0–12.0)
Neutro Abs: 4.4 10*3/uL (ref 1.4–7.7)
Neutrophils Relative %: 57.4 % (ref 43.0–77.0)
Platelets: 458 10*3/uL — ABNORMAL HIGH (ref 150.0–400.0)
RBC: 4.27 Mil/uL (ref 3.87–5.11)
RDW: 17.4 % — ABNORMAL HIGH (ref 11.5–15.5)
WBC: 7.7 10*3/uL (ref 4.0–10.5)

## 2022-11-13 LAB — BASIC METABOLIC PANEL
BUN: 10 mg/dL (ref 6–23)
CO2: 25 mEq/L (ref 19–32)
Calcium: 8.8 mg/dL (ref 8.4–10.5)
Chloride: 102 mEq/L (ref 96–112)
Creatinine, Ser: 0.67 mg/dL (ref 0.40–1.20)
GFR: 84.5 mL/min (ref >60.00)
Glucose, Bld: 128 mg/dL — ABNORMAL HIGH (ref 70–99)
Potassium: 4 mEq/L (ref 3.5–5.1)
Sodium: 136 mEq/L (ref 135–145)

## 2022-11-13 LAB — IBC + FERRITIN
Ferritin: 5.2 ng/mL — ABNORMAL LOW (ref 10.0–291.0)
Iron: 17 ug/dL — ABNORMAL LOW (ref 42–145)
Saturation Ratios: 3.7 % — ABNORMAL LOW (ref 20.0–50.0)
TIBC: 460.6 ug/dL — ABNORMAL HIGH (ref 250.0–450.0)
Transferrin: 329 mg/dL (ref 212.0–360.0)

## 2022-11-13 LAB — TSH: TSH: 2.9 u[IU]/mL (ref 0.35–5.50)

## 2022-11-13 LAB — HEPATIC FUNCTION PANEL
ALT: 6 U/L (ref 0–35)
AST: 10 U/L (ref 0–37)
Albumin: 4.2 g/dL (ref 3.5–5.2)
Alkaline Phosphatase: 79 U/L (ref 39–117)
Bilirubin, Direct: 0 mg/dL (ref 0.0–0.3)
Total Bilirubin: 0.2 mg/dL (ref 0.2–1.2)
Total Protein: 7.5 g/dL (ref 6.0–8.3)

## 2022-11-13 LAB — HEMOGLOBIN A1C: Hgb A1c MFr Bld: 8.3 % — ABNORMAL HIGH (ref 4.6–6.5)

## 2022-11-13 MED ORDER — BLOOD GLUCOSE MONITOR KIT: PACK | 0 refills | Status: DC

## 2022-11-13 NOTE — Progress Notes (Unsigned)
Subjective:    Patient ID: Jody Wang, adult    DOB: 1946/06/04, 77 y.o.   MRN: [id number]  Patient here for  Chief Complaint  Patient presents with   Medical Management of Chronic Issues   Diabetes    HPI Here to follow up regarding her diabetes and cholesterol. Is s/p total knee arthroplasty. Being followed by ortho. Doing well.  Completed PT.  Discussed balance.  Discussed doing her exercises at home.  Diagnosed with covid 10/20/22.  Treated with molnupiravir.  Doing better.  Symptoms have resolved.  Feeling better.  No cough or sob.  No fatigue.  No chest pain or sob reported.  No abdominal pain.  Bowels moving.  Brother passed recently.  Handling stress.  Has good support.  Reports blood sugars averaging 130s.     Past Medical History:  Diagnosis Date   AK (actinic keratosis)    Anemia    Aortic atherosclerosis (HCC)    Carotid artery disease (HCC)    Carotid bruit    Complication of anesthesia    hard time waking up x 1 surgery   Depression    Diabetes mellitus without complication (HCC)    GERD (gastroesophageal reflux disease)    Heart murmur    Hypercholesterolemia    Hypothyroidism    Lung nodule, multiple    Osteoarthritis of knee    Shingles    Wears dentures    partial lower   Past Surgical History:  Procedure Laterality Date   ABDOMINAL HYSTERECTOMY  1985   ovaries not removed   BREAST CYST EXCISION Right    BREAST CYST EXCISION Left    CATARACT EXTRACTION W/PHACO Right 02/16/2016   Procedure: CATARACT EXTRACTION PHACO AND INTRAOCULAR LENS PLACEMENT ([place]) RIGHT ;  Surgeon: Leandrew Koyanagi, MD;  Location: [facility];  Service: Ophthalmology;  Laterality: Right;  DIABETIC - oral meds   CATARACT EXTRACTION W/PHACO Left 03/15/2016   Procedure: CATARACT EXTRACTION PHACO AND INTRAOCULAR LENS PLACEMENT ([facility])left eye;  Surgeon: Leandrew Koyanagi, MD;  Location: [facility] [place];  Service: Ophthalmology;  Laterality: Left;  DIABETIC -  oral meds   FOOT SURGERY Bilateral    INNER EAR SURGERY Left    KNEE ARTHROPLASTY Right 03/17/2022   Procedure: COMPUTER ASSISTED TOTAL KNEE ARTHROPLASTY;  Surgeon: Dereck Leep, MD;  Location: [facility];  Service: Orthopedics;  Laterality: Right;   KNEE ARTHROPLASTY Left 08/23/2022   Procedure: COMPUTER ASSISTED TOTAL KNEE ARTHROPLASY;  Surgeon: Dereck Leep, MD;  Location: [facility];  Service: Orthopedics;  Laterality: Left;   Family History  Problem Relation Age of Onset   Diabetes Father    Coronary artery disease Father        s/p CABG   Hypertension Father    Hypercholesterolemia Father    Hypertension Brother    Diabetes Brother    Hypertension Sister    Glaucoma Sister    Diabetes Other        niece   Breast cancer Maternal Aunt    Breast cancer Paternal Aunt    Cervical cancer Paternal Aunt    Breast cancer Cousin    Breast cancer Other    Social History   Socioeconomic History   Marital status: Widowed    Spouse name: Not on file   Number of children: 3   Years of education: Not on file   Highest education level: Not on file  Occupational History   Not on file  Tobacco Use   Smoking status:  Former    Packs/day: 1.00    Years: 23.00    Total pack years: 23.00    Types: Cigarettes    Quit date: 11/20/1983    Years since quitting: 39.0   Smokeless tobacco: Never  Vaping Use   Vaping Use: Never used  Substance and Sexual Activity   Alcohol use: Yes    Comment: every monday   Drug use: No   Sexual activity: Never  Other Topics Concern   Not on file  Social History Narrative   Lives at home with son.    Social Determinants of Health   Financial Resource Strain: Medium Risk (11/09/2021)   Overall Financial Resource Strain (CARDIA)    Difficulty of Paying Living Expenses: Somewhat hard  Food Insecurity: No Food Insecurity (08/23/2022)   Hunger Vital Sign    Worried About Running Out of Food in the Last Year: Never true    Ran Out of Food in the Last  Year: Never true  Transportation Needs: No Transportation Needs (08/23/2022)   PRAPARE - Hydrologist (Medical): No    Lack of Transportation (Non-Medical): No  Physical Activity: Not on file  Stress: No Stress Concern Present (02/14/2022)   [place]    Feeling of Stress : Not at all  Social Connections: Unknown (02/14/2022)   Social Connection and Isolation Panel [NHANES]    Frequency of Communication with Friends and Family: More than three times a week    Frequency of Social Gatherings with Friends and Family: More than three times a week    Attends Religious Services: More than 4 times per year    Active Member of [employer] or Organizations: Yes    Attends Music therapist: More than 4 times per year    Marital Status: Not on file     Review of Systems  Constitutional:  Negative for appetite change and unexpected weight change.  HENT:  Negative for congestion and sinus pressure.   Respiratory:  Negative for cough, chest tightness and shortness of breath.   Cardiovascular:  Negative for chest pain, palpitations and leg swelling.  Gastrointestinal:  Negative for abdominal pain, diarrhea, nausea and vomiting.  Genitourinary:  Negative for difficulty urinating and dysuria.  Musculoskeletal:  Negative for myalgias.        Is s/p total knee arthroplasty. Being followed by ortho. Doing well.   Skin:  Negative for color change and rash.  Neurological:  Negative for dizziness and headaches.  Psychiatric/Behavioral:  Negative for agitation and dysphoric mood.        Objective:     BP 120/70 (BP Location: Left Arm, Patient Position: Sitting, Cuff Size: Small)   Pulse 72   Temp 98.1 F (36.7 C) (Temporal)   Resp 14   Ht '5\' 2"'$  (1.575 m)   Wt 131 lb 9.6 oz (59.7 kg)   SpO2 99%   BMI 24.07 kg/m  Wt Readings from Last 3 Encounters:  11/13/22 131 lb 9.6 oz (59.7 kg)  08/23/22  131 lb 9.8 oz (59.7 kg)  08/11/22 131 lb 11.2 oz (59.7 kg)    Physical Exam Vitals reviewed.  Constitutional:      General: He is not in acute distress.    Appearance: Normal appearance. He is well-developed.  HENT:     Head: Normocephalic and atraumatic.     Right Ear: External ear normal.     Left Ear: External ear normal.  Eyes:     General: No scleral icterus.       Right eye: No discharge.        Left eye: No discharge.     Conjunctiva/sclera: Conjunctivae normal.  Cardiovascular:     Rate and Rhythm: Normal rate and regular rhythm.  Pulmonary:     Effort: Pulmonary effort is normal. No respiratory distress.     Breath sounds: Normal breath sounds.  Abdominal:     General: Bowel sounds are normal.     Palpations: Abdomen is soft.     Tenderness: There is no abdominal tenderness.  Musculoskeletal:        General: No tenderness.     Cervical back: Neck supple. No tenderness.     Comments:  Is s/p total knee arthroplasty. Well healed incision site.    Lymphadenopathy:     Cervical: No cervical adenopathy.  Skin:    Findings: No erythema or rash.  Neurological:     Mental Status: He is alert.  Psychiatric:        Mood and Affect: Mood normal.        Behavior: Behavior normal.      Outpatient Encounter Medications as of 11/13/2022  Medication Sig   acetaminophen (TYLENOL) 500 MG tablet Take 1-2 tablets (500-1,000 mg total) by mouth every 6 (six) hours as needed for mild pain.   celecoxib (CELEBREX) 200 MG capsule Take 1 capsule (200 mg total) by mouth 2 (two) times daily.   celecoxib (CELEBREX) 200 MG capsule Take 1 capsule (200 mg total) by mouth 2 (two) times daily.   cholecalciferol (VITAMIN D) 1000 units tablet Take 1,000 Units by mouth daily.   Dulaglutide (TRULICITY) 3 YQ/8.2NO SOPN Inject 3 mg into the skin once a week. (Patient taking differently: Inject 3 mg into the skin once a week. Mondays)   FLUoxetine (PROZAC) 20 MG capsule TAKE 1 CAPSULE BY MOUTH DAILY    fluticasone (FLONASE) 50 MCG/ACT nasal spray USE 2 SPRAYS IN BOTH NOSTRILS  DAILY   glucose blood (ONETOUCH VERIO) test strip TEST BLOOD SUGAR TWICE DAILY AS DIRECTED dx e11.9   glucose blood test strip Use as instructed to check blood sugars to check blood sugars twice daily. Dx E11.9   levocetirizine (XYZAL) 5 MG tablet TAKE 1 TABLET BY MOUTH DAILY   levothyroxine (SYNTHROID) 88 MCG tablet TAKE 1 TABLET BY MOUTH DAILY   metFORMIN (GLUCOPHAGE-XR) 500 MG 24 hr tablet TAKE 1 TABLET BY MOUTH TWICE  DAILY   pantoprazole (PROTONIX) 40 MG tablet Take 1 tablet (40 mg total) by mouth daily. TAKE 1 TABLET BY MOUTH  DAILY   rosuvastatin (CRESTOR) 40 MG tablet TAKE 1 TABLET BY MOUTH DAILY   traMADol (ULTRAM) 50 MG tablet Take 1-2 tablets (50-100 mg total) by mouth every 4 (four) hours as needed for moderate pain.   traZODone (DESYREL) 50 MG tablet TAKE 1 TABLET BY MOUTH DAILY   vitamin B-12 (CYANOCOBALAMIN) 1000 MCG tablet Take 1,000 mcg by mouth daily.   [DISCONTINUED] benzonatate (TESSALON) 100 MG capsule Take 1 capsule (100 mg total) by mouth 3 (three) times daily as needed for cough.   [DISCONTINUED] blood glucose meter kit and supplies KIT Dispense based on patient and insurance preference. Use to check blood sugars twice daily. DX E11.9   [DISCONTINUED] pravastatin (PRAVACHOL) 40 MG tablet Take 40 mg by mouth daily.   blood glucose meter kit and supplies KIT Dispense based on patient and insurance preference. Use to check blood sugars twice  daily. DX E11.9   [DISCONTINUED] enoxaparin (LOVENOX) 40 MG/0.4ML injection Inject 0.4 mLs (40 mg total) into the skin daily for 14 days.   [DISCONTINUED] HYDROcodone-acetaminophen (NORCO) 5-325 MG tablet Take 1 tablet by mouth every 4 (four) hours as needed for severe pain.   [DISCONTINUED] mupirocin ointment (BACTROBAN) 2 % Apply 1 application topically 2 (two) times daily. (Patient not taking: Reported on 08/11/2022)   [DISCONTINUED] ondansetron (ZOFRAN) 4 MG  tablet Take 1 tablet (4 mg total) by mouth every 6 (six) hours as needed for nausea. (Patient not taking: Reported on 08/11/2022)   [DISCONTINUED] traMADol (ULTRAM) 50 MG tablet Take 1-2 tablets (50-100 mg total) by mouth every 4 (four) hours as needed for moderate pain.   No facility-administered encounter medications on file as of 11/13/2022.     Lab Results  Component Value Date   WBC 7.7 11/13/2022   HGB 9.5 (L) 11/13/2022   HCT 30.5 (L) 11/13/2022   PLT 458.0 (H) 11/13/2022   GLUCOSE 128 (H) 11/13/2022   CHOL 109 11/13/2022   TRIG 106.0 11/13/2022   HDL 47.50 11/13/2022   LDLDIRECT 188.8 06/30/2013   LDLCALC 41 11/13/2022   ALT 6 11/13/2022   AST 10 11/13/2022   NA 136 11/13/2022   K 4.0 11/13/2022   CL 102 11/13/2022   CREATININE 0.67 11/13/2022   BUN 10 11/13/2022   CO2 25 11/13/2022   TSH 2.90 11/13/2022   HGBA1C 8.3 (H) 11/13/2022   MICROALBUR 1.2 07/08/2021    DG Knee Left Port  Result Date: 08/23/2022 CLINICAL DATA:  Left knee arthroplasty EXAM: PORTABLE LEFT KNEE - 1-2 VIEW COMPARISON:  None Available. FINDINGS: Postsurgical changes from left total knee arthroplasty. Arthroplasty components are in their expected alignment. No periprosthetic fracture or evidence of other complication. Expected postoperative changes within the overlying soft tissues. IMPRESSION: Satisfactory postoperative appearance status post left total knee arthroplasty. Electronically Signed   By: Davina Poke D.O.   On: 08/23/2022 11:29       Assessment & Plan:  Type 2 diabetes mellitus with hyperglycemia, without long-term current use of insulin (HCC) Assessment & Plan: Continues on trulicity and metformin.   Last a1c 7.7.  Discussed adjusting medication.  She desired to work on diet and exercise before changing medication.  Discussed low carb diet and exercise as tolerated. Brought in no sugar readings. States blood sugars averaging 130s.  Check met b and A1c today.   Orders: -      Hemoglobin A1c -     Basic metabolic panel  Type 2 diabetes mellitus with other circulatory complication, without long-term current use of insulin (HCC) -     blood glucose meter kit and supplies; Dispense based on patient and insurance preference. Use to check blood sugars twice daily. DX E11.9  Dispense: 1 each; Refill: 0 -     Microalbumin / creatinine urine ratio; Future  Anemia, unspecified type Assessment & Plan: With IDA.  On iron.  Have discussed GI evaluation.  cologuard 02/2019- negative.  Has declined any further GI w/up.   Orders: -     IBC + Ferritin -     CBC with Differential/Platelet  Hypercholesterolemia Assessment & Plan: Continue crestor.  Low cholesterol diet and exercise.  Follow lipid panel and liver function tests.   Orders: -     Lipid panel -     Hepatic function panel  Hypothyroidism, unspecified type Assessment & Plan: On thyroid replacement.  Follow tsh.   Orders: -  TSH  Abnormal CT of the chest Assessment & Plan: Previously saw Dr Genevive Bi.  Overdue f/u.  Again discussed today.  Has declined f/u chest CT.     Aortic atherosclerosis ([place]) Assessment & Plan: Continue crestor.    Bilateral carotid artery disease, unspecified type ([place]) Assessment & Plan: Previous left carotid 60-79% and right 40-50%.  Continue crestor.  Needs f/u with [facility] to monitor. Discussed again with her today.  Agreeable to referral. Continue risk factor modification.  Continue crestor.    Coronary artery disease of native artery of native heart with stable angina pectoris [facility]) Assessment & Plan: Previously seen on chest CT.  Saw Dr Rockey Situ.  No chest pain.  Continue risk factor modification.  Continue crestor.   Orders: -     Ambulatory referral to Vascular Surgery  Gastroesophageal reflux disease, unspecified whether esophagitis present Assessment & Plan: Continue protonix.  No upper symptoms reported.    Knee pain, unspecified chronicity, unspecified  laterality Assessment & Plan: S/p Left total knee arthroplasty using computer-assisted navigation (08/23/22 - Dr Marry Guan). Doing well. Continue f/u with orho.    Major depressive disorder, single episode, mild (HCC) Assessment & Plan: Overall appears to be doing well and handling stress.  Continue prozac.  Follow.        Einar Pheasant, MD

## 2022-11-14 ENCOUNTER — Encounter: Payer: Self-pay | Admitting: Internal Medicine

## 2022-11-14 NOTE — Assessment & Plan Note (Signed)
Previous left carotid 60-79% and right 40-50%.  Continue crestor.  Needs f/u with [facility] to monitor. Discussed again with her today.  Agreeable to referral. Continue risk factor modification.  Continue crestor.

## 2022-11-14 NOTE — Assessment & Plan Note (Signed)
On thyroid replacement.  Follow tsh.  

## 2022-11-14 NOTE — Assessment & Plan Note (Signed)
Previously seen on chest CT.  Saw Dr Rockey Situ.  No chest pain.  Continue risk factor modification.  Continue crestor.

## 2022-11-14 NOTE — Assessment & Plan Note (Signed)
S/p Left total knee arthroplasty using computer-assisted navigation (08/23/22 - Dr Marry Guan). Doing well. Continue f/u with orho.

## 2022-11-14 NOTE — Assessment & Plan Note (Signed)
Overall appears to be doing well and handling stress.  Continue prozac.  Follow.

## 2022-11-14 NOTE — Assessment & Plan Note (Signed)
Continue protonix.  No upper symptoms reported.  

## 2022-11-14 NOTE — Assessment & Plan Note (Signed)
Continue crestor.  Low cholesterol diet and exercise. Follow lipid panel and liver function tests.   

## 2022-11-14 NOTE — Assessment & Plan Note (Signed)
With IDA.  On iron.  Have discussed GI evaluation.  cologuard 02/2019- negative.  Has declined any further GI w/up.

## 2022-11-14 NOTE — Assessment & Plan Note (Signed)
Previously saw Dr Genevive Bi.  Overdue f/u.  Again discussed today.  Has declined f/u chest CT.

## 2022-11-14 NOTE — Assessment & Plan Note (Signed)
Continues on trulicity and metformin.   Last a1c 7.7.  Discussed adjusting medication.  She desired to work on diet and exercise before changing medication.  Discussed low carb diet and exercise as tolerated. Brought in no sugar readings. States blood sugars averaging 130s.  Check met b and A1c today.

## 2022-11-14 NOTE — Assessment & Plan Note (Signed)
Continue crestor 

## 2022-11-22 ENCOUNTER — Telehealth: Payer: Self-pay | Admitting: Pharmacist

## 2022-11-22 ENCOUNTER — Other Ambulatory Visit: Payer: Self-pay

## 2022-11-22 DIAGNOSIS — D649 Anemia, unspecified: Secondary | ICD-10-CM

## 2022-11-22 DIAGNOSIS — E1165 Type 2 diabetes mellitus with hyperglycemia: Secondary | ICD-10-CM

## 2022-11-22 NOTE — Progress Notes (Signed)
Contacted patient regarding referral for diabetes and medication access from Einar Pheasant, MD .   Left patient a voicemail to return my call at their convenience. Will also send Prince, PharmD, [facility] [phone]

## 2022-11-23 NOTE — Progress Notes (Signed)
Contacted patient regarding referral for diabetes from Einar Pheasant, MD .   Appointment scheduled   Catie Hedwig Morton, PharmD, [facility] [phone]

## 2022-12-07 ENCOUNTER — Other Ambulatory Visit (INDEPENDENT_AMBULATORY_CARE_PROVIDER_SITE_OTHER): Payer: Medicare Other

## 2022-12-07 DIAGNOSIS — E1159 Type 2 diabetes mellitus with other circulatory complications: Secondary | ICD-10-CM

## 2022-12-07 DIAGNOSIS — D649 Anemia, unspecified: Secondary | ICD-10-CM | POA: Diagnosis not present

## 2022-12-08 LAB — CBC WITH DIFFERENTIAL/PLATELET
Basophils Absolute: 0.1 10*3/uL (ref 0.0–0.1)
Basophils Relative: 0.6 % (ref 0.0–3.0)
Eosinophils Absolute: 0.3 10*3/uL (ref 0.0–0.7)
Eosinophils Relative: 2.9 % (ref 0.0–5.0)
HCT: 31.9 % — ABNORMAL LOW (ref 36.0–46.0)
Hemoglobin: 9.9 g/dL — ABNORMAL LOW (ref 12.0–15.0)
Lymphocytes Relative: 25 % (ref 12.0–46.0)
Lymphs Abs: 2.4 10*3/uL (ref 0.7–4.0)
MCHC: 31.2 g/dL (ref 30.0–36.0)
MCV: 71.3 fl — ABNORMAL LOW (ref 78.0–100.0)
Monocytes Absolute: 0.8 10*3/uL (ref 0.1–1.0)
Monocytes Relative: 8.8 % (ref 3.0–12.0)
Neutro Abs: 5.9 10*3/uL (ref 1.4–7.7)
Neutrophils Relative %: 62.7 % (ref 43.0–77.0)
Platelets: 493 10*3/uL — ABNORMAL HIGH (ref 150.0–400.0)
RBC: 4.48 Mil/uL (ref 3.87–5.11)
RDW: 18.9 % — ABNORMAL HIGH (ref 11.5–15.5)
WBC: 9.5 10*3/uL (ref 4.0–10.5)

## 2022-12-08 LAB — MICROALBUMIN / CREATININE URINE RATIO
Creatinine,U: 113.2 mg/dL
Microalb Creat Ratio: 0.9 mg/g (ref 0.0–30.0)
Microalb, Ur: 1.1 mg/dL (ref 0.0–1.9)

## 2022-12-08 LAB — FERRITIN: Ferritin: 7.5 ng/mL — ABNORMAL LOW (ref 10.0–291.0)

## 2022-12-11 ENCOUNTER — Other Ambulatory Visit: Payer: Self-pay

## 2022-12-11 DIAGNOSIS — D649 Anemia, unspecified: Secondary | ICD-10-CM

## 2022-12-14 ENCOUNTER — Telehealth: Payer: Self-pay | Admitting: Internal Medicine

## 2022-12-14 NOTE — Telephone Encounter (Signed)
Patient called and wanted to know why Dr Nicki Reaper schedule her an appointment with Jefm Bryant.

## 2022-12-14 NOTE — Telephone Encounter (Signed)
Pt advised referred to [facility] due to her Anemia

## 2022-12-18 ENCOUNTER — Telehealth: Payer: Self-pay | Admitting: Pharmacist

## 2022-12-18 ENCOUNTER — Other Ambulatory Visit: Payer: Medicare Other | Admitting: Pharmacist

## 2022-12-18 NOTE — Progress Notes (Signed)
Attempted to contact patient for scheduled appointment for medication management. Left HIPAA compliant message for patient to return my call at their convenience.   Catie Hedwig Morton, PharmD, [place], [facility] [phone]

## 2022-12-26 ENCOUNTER — Ambulatory Visit
Admission: RE | Admit: 2022-12-26 | Discharge: 2022-12-26 | Disposition: A | Discharge status: Home / Self Care | Payer: Medicare Other | Source: Ambulatory Visit | Attending: Internal Medicine | Admitting: Internal Medicine

## 2022-12-26 DIAGNOSIS — Z1231 Encounter for screening mammogram for malignant neoplasm of breast: Secondary | ICD-10-CM

## 2022-12-29 ENCOUNTER — Other Ambulatory Visit: Payer: Medicare Other | Admitting: Pharmacist

## 2022-12-29 MED ORDER — GLUCOSE BLOOD VI STRP: ORAL_STRIP | 12 refills | Status: DC

## 2022-12-29 MED ORDER — TRULICITY 4.5 MG/0.5ML ~~LOC~~ SOAJ: 4.5000 mg | SUBCUTANEOUS | 2 refills | Status: DC

## 2022-12-29 NOTE — Progress Notes (Signed)
12/29/2022 Name: Jody Wang MRN: [id number] DOB: 06-Aug-1946  Chief Complaint  Patient presents with   Medication Management   Diabetes   Hypertension   Hyperlipidemia    Jody Wang is a 77 y.o. year old adult who presented for a telephone visit.   They were referred to the pharmacist by their PCP for assistance in managing diabetes.   Subjective:  Care Team: Primary Care Provider: Einar Pheasant, MD ; Next Scheduled Visit: 03/15/23  Medication Access/Adherence  Current Pharmacy:  [facility] [id number], [address] [place] [address] Phone: [phone] Fax: [phone]  [place], [address] [place] [address] Phone: [phone] Fax: [place], [address] [place] [address] Phone: [phone] Fax: [phone]   Patient reports affordability concerns with their medications: No  Patient reports access/transportation concerns to their pharmacy: No  Patient reports adherence concerns with their medications:  No     Diabetes:  Current medications: metformin XR 123XX123 mg daily, Trulicity 3 mg weekly Medications tried in the past: Jardiance (yeast infections)  Current glucose readings: not checking, reports she has been out of strips lately and was told they cannot be filled until March 12th.  Patient denies hypoglycemic s/sx including dizziness, shakiness, sweating. Patient reports hyperglycemic symptoms including polyuria, polydipsia, polyphagia, nocturia, neuropathy, blurred vision.  Current meal patterns:  - Lunch: green giant vegetable meals - Supper: sometimes goes out to eat with her sister sometimes; sometimes cauliflower  - Snacks: fruit; reports lately she's eating a lot of strawberries and grapes; oranges; grazes on fruit throughout   - Drinks: coke zero;   Current physical activity: working on increased physical activity since knee replacements.   Current medication access support: Trulicity previously through [employer].    Hyperlipidemia/ASCVD Risk Reduction  Current lipid lowering medications: rosuvastatin 40 mg daily  Objective:  Lab Results  Component Value Date   HGBA1C 8.3 (H) 11/13/2022    Lab Results  Component Value Date   CREATININE 0.67 11/13/2022   BUN 10 11/13/2022   NA 136 11/13/2022   K 4.0 11/13/2022   CL 102 11/13/2022   CO2 25 11/13/2022    Lab Results  Component Value Date   CHOL 109 11/13/2022   HDL 47.50 11/13/2022   LDLCALC 41 11/13/2022   LDLDIRECT 188.8 06/30/2013   TRIG 106.0 11/13/2022   CHOLHDL 2 11/13/2022    Medications Reviewed Today     Reviewed by Osker Mason, RPH-CPP (Pharmacist) on 12/29/22 at 1400  Med List Status: <None>   Medication Order Taking? Sig Documenting Provider Last Dose Status Informant  acetaminophen (TYLENOL) 325 MG tablet [id number] Yes Take 650 mg by mouth every 6 (six) hours as needed. [provider] Taking Active   blood glucose meter kit and supplies KIT [id number] Yes Dispense based on patient and insurance preference. Use to check blood sugars twice daily. DX E11.9 Einar Pheasant, MD Taking Active   celecoxib (CELEBREX) 200 MG capsule [id number] Yes Take 1 capsule (200 mg total) by mouth 2 (two) times daily. Fausto Skillern, PA-C Taking Active   cholecalciferol (VITAMIN D) 1000 units tablet [id number] Yes Take 1,000 Units by mouth daily. [provider] Taking Active Self  Dulaglutide (TRULICITY) 3 0000000 SOPN [id number]  Yes Inject 3 mg into the skin once a week.  Patient taking differently: Inject 3 mg into the skin once a week. Mondays   Einar Pheasant, MD Taking Active Self  ferrous sulfate 325 (65 FE) MG EC tablet [id number] Yes Take 325 mg by mouth daily. [provider] Taking Active    FLUoxetine (PROZAC) 20 MG capsule [id number] Yes TAKE 1 CAPSULE BY MOUTH DAILY Einar Pheasant, MD Taking Active   fluticasone (FLONASE) 50 MCG/ACT nasal spray [id number] Yes USE 2 SPRAYS IN BOTH NOSTRILS  DAILY Einar Pheasant, MD Taking Active   glucose blood (ONETOUCH VERIO) test strip [id number] Yes TEST BLOOD SUGAR TWICE DAILY AS DIRECTED dx e11.9 Einar Pheasant, MD Taking Active   glucose blood test strip [id number]  Use as instructed to check blood sugars to check blood sugars twice daily. Dx E11.9 Einar Pheasant, MD  Active Self  levocetirizine (XYZAL) 5 MG tablet [id number] Yes TAKE 1 TABLET BY MOUTH DAILY Einar Pheasant, MD Taking Active   levothyroxine (SYNTHROID) 88 MCG tablet [id number] Yes TAKE 1 TABLET BY MOUTH DAILY Einar Pheasant, MD Taking Active   metFORMIN (GLUCOPHAGE-XR) 500 MG 24 hr tablet [id number] Yes TAKE 1 TABLET BY MOUTH TWICE  DAILY Einar Pheasant, MD Taking Active   pantoprazole (PROTONIX) 40 MG tablet [id number] Yes Take 1 tablet (40 mg total) by mouth daily. TAKE 1 TABLET BY MOUTH  DAILY Einar Pheasant, MD Taking Active   rosuvastatin (CRESTOR) 40 MG tablet [id number] Yes TAKE 1 TABLET BY MOUTH DAILY Einar Pheasant, MD Taking Active   traZODone (DESYREL) 50 MG tablet [id number] Yes TAKE 1 TABLET BY MOUTH DAILY Einar Pheasant, MD Taking Active   vitamin B-12 (CYANOCOBALAMIN) 1000 MCG tablet [id number] Yes Take 1,000 mcg by mouth daily. [provider] Taking Active Self              Assessment/Plan:   Diabetes: - Currently uncontrolled - Reviewed long term cardiovascular and renal outcomes of uncontrolled blood sugar - Reviewed goal A1c, goal fasting, and goal 2 hour post prandial glucose - Reviewed dietary modifications including: focus on lean proteins, vegetables, minimizing carbohydrates, minimizing sodas;  - Reviewed lifestyle modifications including: focus on increasing physical activity as able - Recommend to increase Trulicity to 4.5. Meets  financial criteria for Trulicity patient assistance program through [employer]. Will collaborate with provider, CPhT, and patient to pursue assistance.  - Recommend to check glucose twice daily, fasting and 2 hour post prandial. Sent script for test strips to [facility]. Contacted pharmacy.   Hyperlipidemia/ASCVD Risk Reduction: - Currently controlled.  - Recommend to continue current regimen  Follow Up Plan: phone call in 6 weeks  Catie Hedwig Morton, PharmD, [place], [facility] [phone]

## 2022-12-29 NOTE — Patient Instructions (Signed)
[place],   We will mail you the pages of the application to reapply for Trulicity assistance for 2024.

## 2023-01-03 ENCOUNTER — Other Ambulatory Visit (HOSPITAL_COMMUNITY): Payer: Self-pay

## 2023-01-09 DIAGNOSIS — E119 Type 2 diabetes mellitus without complications: Secondary | ICD-10-CM | POA: Diagnosis not present

## 2023-01-09 DIAGNOSIS — Z961 Presence of intraocular lens: Secondary | ICD-10-CM | POA: Diagnosis not present

## 2023-01-09 LAB — HM DIABETES EYE EXAM

## 2023-01-15 ENCOUNTER — Other Ambulatory Visit (HOSPITAL_COMMUNITY): Payer: Self-pay

## 2023-01-15 ENCOUNTER — Telehealth: Payer: Self-pay

## 2023-01-15 NOTE — Telephone Encounter (Signed)
Sent PAP app for Trulicity renewal [id number]

## 2023-01-16 ENCOUNTER — Other Ambulatory Visit (INDEPENDENT_AMBULATORY_CARE_PROVIDER_SITE_OTHER): Payer: Medicare Other

## 2023-01-16 DIAGNOSIS — D649 Anemia, unspecified: Secondary | ICD-10-CM

## 2023-01-16 LAB — CBC WITH DIFFERENTIAL/PLATELET
Basophils Absolute: 0 10*3/uL (ref 0.0–0.1)
Basophils Relative: 0.4 % (ref 0.0–3.0)
Eosinophils Absolute: 0.3 10*3/uL (ref 0.0–0.7)
Eosinophils Relative: 3.6 % (ref 0.0–5.0)
HCT: 36.5 % (ref 36.0–46.0)
Hemoglobin: 11.9 g/dL — ABNORMAL LOW (ref 12.0–15.0)
Lymphocytes Relative: 28.9 % (ref 12.0–46.0)
Lymphs Abs: 2.5 10*3/uL (ref 0.7–4.0)
MCHC: 32.6 g/dL (ref 30.0–36.0)
MCV: 77.7 fl — ABNORMAL LOW (ref 78.0–100.0)
Monocytes Absolute: 0.7 10*3/uL (ref 0.1–1.0)
Monocytes Relative: 8.6 % (ref 3.0–12.0)
Neutro Abs: 5 10*3/uL (ref 1.4–7.7)
Neutrophils Relative %: 58.5 % (ref 43.0–77.0)
Platelets: 345 10*3/uL (ref 150.0–400.0)
RBC: 4.7 Mil/uL (ref 3.87–5.11)
RDW: 25.9 % — ABNORMAL HIGH (ref 11.5–15.5)
WBC: 8.5 10*3/uL (ref 4.0–10.5)

## 2023-01-16 LAB — FERRITIN: Ferritin: 10.1 ng/mL (ref 10.0–291.0)

## 2023-02-06 NOTE — Telephone Encounter (Signed)
Patient called today wondering about status of Trulicity re-application, notes she opened her last box of Trulicity today. Routing to CPhT to contact [employer].

## 2023-02-07 NOTE — Telephone Encounter (Signed)
Need to resend MD portion to be reviewed/signed so I can resubmit application. Faxing to office now.

## 2023-02-12 ENCOUNTER — Telehealth: Payer: Self-pay | Admitting: Internal Medicine

## 2023-02-12 NOTE — Telephone Encounter (Signed)
Prescription Request  02/12/2023  LOV: 11/13/2022  What is the name of the medication or equipment? glucose blood test strip-OneTouch Verio  Have you contacted your pharmacy to request a refill? Yes   Which pharmacy would you like this sent to?    [facility] - [place], [place] - [address] [place] [address] Phone: [phone] Fax: [phone]      Patient notified that their request is being sent to the clinical staff for review and that they should receive a response within 2 business days.   Please advise at Mobile [phone] (mobile)

## 2023-02-13 ENCOUNTER — Other Ambulatory Visit: Payer: Self-pay

## 2023-02-13 ENCOUNTER — Other Ambulatory Visit: Payer: Medicare Other | Admitting: Pharmacist

## 2023-02-13 MED ORDER — GLUCOSE BLOOD VI STRP: ORAL_STRIP | 12 refills | Status: DC

## 2023-02-13 NOTE — Telephone Encounter (Signed)
[facility]ill [facility] to [facility]

## 2023-02-13 NOTE — Progress Notes (Unsigned)
02/13/2023 Name: Jody Wang MRN: [id number] DOB: 03/20/1946  Chief Complaint  Patient presents with   Medication Management   Diabetes   Hypertension   Hyperlipidemia    Jody Wang is a 77 y.o. year old adult who presented for a telephone visit.   They were referred to the pharmacist by their PCP for assistance in managing diabetes and hyperlipidemia.   Subjective:  Care Team: Primary Care Provider: Dale Apison, MD ; Next Scheduled Visit: 03/15/23   Medication Access/Adherence  Current Pharmacy:  [id number] [place], [place] - [address] AT [facility] [address] [place] [address] [place] [address] Phone: [phone] Fax: [phone]  [facility] - [place], [place] - [address] [place] [address] Phone: [phone] Fax: [phone]  [facility] - [place], [place] - [address] [place] [address] Phone: [phone] Fax: [phone]  [facility] [place], [place] - [address] [place] [address] Phone: [phone] Fax: [phone]   Patient reports affordability concerns with their medications: No  Patient reports access/transportation concerns to their pharmacy: No  Patient reports adherence concerns with their medications:  No     Diabetes:  Current medications: metformin XR 500 mg daily, Trulicity 3 mg weekly - increasing to Trulicity 4.5 mg weekly Previous medications: nausea with higher dose of metformin; Jardiance - yeast infection  Current glucose readings: reports fastings can range from 110s-150s  Patient denies hypoglycemic s/sx including dizziness, shakiness, sweating. Patient denies hyperglycemic symptoms including polyuria, polydipsia, polyphagia, nocturia, neuropathy, blurred vision.  Current meal patterns:  - Breakfast: peanut butter bar - Lunch/Supper:  today, she's going to [employer]  - Supper: reports a bedtime snack with protein/carb - Snacks: grapes, strawberries, raspberries, blackberries; notes she will often have a snack before bed with protein and carbs  Current medication access support: waiting on re-approval for Trulicity assistance   Objective:  Lab Results  Component Value Date   HGBA1C 8.3 (H) 11/13/2022    Lab Results  Component Value Date   CREATININE 0.67 11/13/2022   BUN 10 11/13/2022   NA 136 11/13/2022   K 4.0 11/13/2022   CL 102 11/13/2022   CO2 25 11/13/2022    Lab Results  Component Value Date   CHOL 109 11/13/2022   HDL 47.50 11/13/2022   LDLCALC 41 11/13/2022   LDLDIRECT 188.8 06/30/2013   TRIG 106.0 11/13/2022   CHOLHDL 2 11/13/2022    Medications Reviewed Today     Reviewed by Alden Hipp, RPH-CPP (Pharmacist) on 12/29/22 at 1400  Med List Status: <None>   Medication Order Taking? Sig Documenting Provider Last Dose Status Informant  acetaminophen (TYLENOL) 325 MG tablet [id number] Yes Take 650 mg by mouth every 6 (six) hours as needed. [provider] Taking Active   blood glucose meter kit and supplies KIT [id number] Yes Dispense based on patient and insurance preference. Use to check blood sugars twice daily. DX E11.9 Dale [place], MD Taking Active   celecoxib (CELEBREX) 200 MG capsule [id number] Yes Take 1 capsule (200 mg total) by mouth 2 (two) times daily. Madelyn Flavors, PA-C Taking Active   cholecalciferol (VITAMIN D) 1000 units tablet [id number] Yes Take 1,000 Units by mouth daily. [provider] Taking Active Self  Dulaglutide (TRULICITY) 3 MG/0.5ML SOPN [id number] Yes Inject  3 mg into the skin once a week.  Patient taking differently: Inject 3 mg into the skin once a week. Mondays   Dale [place], MD Taking Active Self  ferrous sulfate 325 (65 FE) MG EC tablet [id number] Yes Take 325 mg by mouth daily. [provider] Taking Active    FLUoxetine (PROZAC) 20 MG capsule [id number] Yes TAKE 1 CAPSULE BY MOUTH DAILY Dale [place], MD Taking Active   fluticasone (FLONASE) 50 MCG/ACT nasal spray [id number] Yes USE 2 SPRAYS IN BOTH NOSTRILS  DAILY Dale [place], MD Taking Active   glucose blood (ONETOUCH VERIO) test strip [id number] Yes TEST BLOOD SUGAR TWICE DAILY AS DIRECTED dx e11.9 Dale [place], MD Taking Active   glucose blood test strip [id number]  Use as instructed to check blood sugars to check blood sugars twice daily. Dx E11.9 Dale [place], MD  Active Self  levocetirizine (XYZAL) 5 MG tablet [id number] Yes TAKE 1 TABLET BY MOUTH DAILY Dale [place], MD Taking Active   levothyroxine (SYNTHROID) 88 MCG tablet [id number] Yes TAKE 1 TABLET BY MOUTH DAILY Dale [place], MD Taking Active   metFORMIN (GLUCOPHAGE-XR) 500 MG 24 hr tablet [id number] Yes TAKE 1 TABLET BY MOUTH TWICE  DAILY Dale [place], MD Taking Active   pantoprazole (PROTONIX) 40 MG tablet [id number] Yes Take 1 tablet (40 mg total) by mouth daily. TAKE 1 TABLET BY MOUTH  DAILY Dale [place], MD Taking Active   rosuvastatin (CRESTOR) 40 MG tablet [id number] Yes TAKE 1 TABLET BY MOUTH DAILY Dale [place], MD Taking Active   traZODone (DESYREL) 50 MG tablet [id number] Yes TAKE 1 TABLET BY MOUTH DAILY Dale , MD Taking Active   vitamin B-12 (CYANOCOBALAMIN) 1000 MCG tablet [id number] Yes Take 1,000 mcg by mouth daily. [provider] Taking Active Self              Assessment/Plan:   Diabetes: - Currently uncontrolled - Reviewed long term cardiovascular and renal outcomes of uncontrolled blood sugar - Reviewed goal A1c, goal fasting, and goal 2 hour post prandial glucose - Reviewed dietary modifications including: discussed that she does not have to eat a carbohydrate before bed, she is not on hypoglycemia causing medications. Praised for incorporation of proteins into meals. Discussed moderating portion sizes with snacks,  particularly fruit - Reviewed lifestyle modifications including: increase physical activity as able - Discussed patient assistance application status with CPhT. Patient and provider applications submitted today.    Follow Up Plan: PCP in 4 weeks  [name], PharmD, BCACP, CPP [facility] [phone]

## 2023-02-14 ENCOUNTER — Other Ambulatory Visit (HOSPITAL_COMMUNITY): Payer: Self-pay

## 2023-02-14 NOTE — Patient Instructions (Signed)
Jody Wang,   It was great talking to you today!  Continue Trulicity 3 mg weekly. We will increase you to 4.5 mg weekly when we get your new order in from the manufacturer.   Pay attention to how frequently you are snacking on sugars/carbohydrates - like fruit, sweets, breads, chips, etc. Those things can all raise your blood sugars.   Check your blood sugars twice daily:  1) Fasting, first thing in the morning before breakfast and  2) 2 hours after your largest meal.   For a goal A1c of less than 7%, goal fasting readings are less than 130 and goal 2 hour after meal readings are less than 180.    Take care!  [name], PharmD, BCACP, CPP [facility] [phone]

## 2023-02-20 ENCOUNTER — Other Ambulatory Visit: Payer: Self-pay | Admitting: Internal Medicine

## 2023-02-26 ENCOUNTER — Other Ambulatory Visit: Payer: Self-pay | Admitting: Pharmacist

## 2023-02-26 ENCOUNTER — Telehealth: Payer: Self-pay | Admitting: Internal Medicine

## 2023-02-26 NOTE — Progress Notes (Signed)
Care Coordination Call  Received message from patient that she received a letter from [facility] regarding Trulicity, noting she needed to complete an enclosed form and return to her Medicare plan. Likely just form notifying that she would not be filling Trulicity on insurance as she is receiving from Patient Assistance.   Assume patient has been approved for Trulicity assistance. Will collaborate with CPhT team to [employer] to follow up on shipping.   [name], PharmD, BCACP, CPP [facility] [phone]

## 2023-02-26 NOTE — Telephone Encounter (Signed)
Called patient to schedule Medicare Annual Wellness Visit (AWV). Left message for patient to call back and schedule Medicare Annual Wellness Visit (AWV).  Last date of AWV: 02/14/2022   Please schedule an AWVS appointment at any time with [facility] VISIT.  If any questions, please contact me at [phone].    Thank you,  [facility] Direct dial  [phone]

## 2023-02-28 NOTE — Telephone Encounter (Signed)
Pt called in asking to speak to Jody Wang. I scheduled her AWV for tomorrow 4/25 in person here at the office.

## 2023-03-01 ENCOUNTER — Ambulatory Visit (INDEPENDENT_AMBULATORY_CARE_PROVIDER_SITE_OTHER): Payer: Medicare Other

## 2023-03-01 VITALS — Ht 62.0 in | Wt 131.0 lb

## 2023-03-01 DIAGNOSIS — Z0000 Encounter for general adult medical examination without abnormal findings: Secondary | ICD-10-CM | POA: Diagnosis not present

## 2023-03-01 NOTE — Progress Notes (Signed)
Subjective:   Jody Wang is a 77 y.o. female who presents for Medicare Annual (Subsequent) preventive examination.  Review of Systems    No ROS.  Medicare Wellness Virtual Visit.  Visual/audio telehealth visit, UTA vital signs.   See social history for additional risk factors.   Cardiac Risk Factors include: advanced age (>51men, >53 women)     Objective:    Today's Vitals   03/01/23 1440  Weight: 131 lb (59.4 kg)  Height:  (1.575 m)   Body mass index is 23.96 kg/m.     03/01/2023    2:36 PM 08/23/2022    6:19 AM 08/11/2022    2:36 PM 03/17/2022    1:00 PM 03/17/2022    6:35 AM 03/06/2022   11:15 AM 02/14/2022    1:46 PM  Advanced Directives  Does Patient Have a Medical Advance Directive? Yes Yes Yes No Yes Yes No  Type of Estate agent of [place];Living will Healthcare Power of [place];Living will Healthcare Power of [employer] Power of Attorney    Does patient want to make changes to medical advance directive? No - Patient declined No - Patient declined No - Patient declined  No - Patient declined    Copy of Healthcare Power of Attorney in Chart? No - copy requested Yes - validated most recent copy scanned in chart (See row information)   No - copy requested    Would patient like information on creating a medical advance directive?  No - Patient declined No - Patient declined No - Patient declined   No - Patient declined    Current Medications (verified) Outpatient Encounter Medications as of 03/01/2023  Medication Sig   acetaminophen (TYLENOL) 325 MG tablet Take 650 mg by mouth every 6 (six) hours as needed.   blood glucose meter kit and supplies KIT Dispense based on patient and insurance preference. Use to check blood sugars twice daily. DX E11.9   celecoxib (CELEBREX) 200 MG capsule Take 1 capsule (200 mg total) by mouth 2 (two) times daily.   cholecalciferol (VITAMIN D) 1000 units tablet Take 1,000 Units by mouth daily.    Dulaglutide (TRULICITY) 4.5 MG/0.5ML SOPN Inject 4.5 mg as directed once a week.   ferrous sulfate 325 (65 FE) MG EC tablet Take 325 mg by mouth daily.   FLUoxetine (PROZAC) 20 MG capsule TAKE 1 CAPSULE BY MOUTH DAILY   fluticasone (FLONASE) 50 MCG/ACT nasal spray USE 2 SPRAYS IN BOTH NOSTRILS  DAILY   glucose blood test strip Use as instructed to check blood sugars to check blood sugars twice daily. Dx E11.9   levocetirizine (XYZAL) 5 MG tablet TAKE 1 TABLET BY MOUTH DAILY   levothyroxine (SYNTHROID) 88 MCG tablet TAKE 1 TABLET BY MOUTH DAILY   metFORMIN (GLUCOPHAGE-XR) 500 MG 24 hr tablet TAKE 1 TABLET BY MOUTH TWICE  DAILY   pantoprazole (PROTONIX) 40 MG tablet TAKE 1 TABLET BY MOUTH DAILY   rosuvastatin (CRESTOR) 40 MG tablet TAKE 1 TABLET BY MOUTH DAILY   traZODone (DESYREL) 50 MG tablet TAKE 1 TABLET BY MOUTH DAILY   vitamin B-12 (CYANOCOBALAMIN) 1000 MCG tablet Take 1,000 mcg by mouth daily.   No facility-administered encounter medications on file as of 03/01/2023.    Allergies (verified) Codeine, Meperidine and related, Oxycontin [oxycodone hcl], Ezetimibe, Silicone, Jardiance [empagliflozin], and Tape   History: Past Medical History:  Diagnosis Date   AK (actinic keratosis)    Anemia    Aortic atherosclerosis  Carotid artery disease    Carotid bruit    Chest pain 01/28/2017   Complication of anesthesia    hard time waking up x 1 surgery   Depression    Diabetes mellitus without complication    GERD (gastroesophageal reflux disease)    Heart murmur    Hypercholesterolemia    Hypothyroidism    Lung nodule, multiple    Osteoarthritis of knee    Shingles    Wears dentures    partial lower   Past Surgical History:  Procedure Laterality Date   ABDOMINAL HYSTERECTOMY  1985   ovaries not removed   BREAST CYST EXCISION Right    BREAST CYST EXCISION Left    CATARACT EXTRACTION W/PHACO Right 02/16/2016   Procedure: CATARACT EXTRACTION PHACO AND INTRAOCULAR LENS  PLACEMENT (IOC) RIGHT ;  Surgeon: Lockie Mola, MD;  Location[facility];  Service: Ophthalmology;  Laterality: Right;  DIABETIC - oral meds   CATARACT EXTRACTION W/PHACO Left 03/15/2016   Procedure: CATARACT EXTRACTION PHACO AND INTRAOCULAR LENS PLACEMENT (IOC)left eye;  Surgeon: Lockie Mola, MD;  Location[facility];  Service: Ophthalmology;  Laterality: Left;  DIABETIC - oral meds   FOOT SURGERY Bilateral    INNER EAR SURGERY Left    KNEE ARTHROPLASTY Right 03/17/2022   Procedure: COMPUTER ASSISTED TOTAL KNEE ARTHROPLASTY;  Surgeon: Donato Heinz, MD;  Location: [facility];  Service: Orthopedics;  Laterality: Right;   KNEE ARTHROPLASTY Left 08/23/2022   Procedure: COMPUTER ASSISTED TOTAL KNEE ARTHROPLASY;  Surgeon: Donato Heinz, MD;  Location: [facility];  Service: Orthopedics;  Laterality: Left;   Family History  Problem Relation Age of Onset   Diabetes Father    Coronary artery disease Father        s/p CABG   Hypertension Father    Hypercholesterolemia Father    Hypertension Brother    Diabetes Brother    Hypertension Sister    Glaucoma Sister    Diabetes Other        niece   Breast cancer Maternal Aunt    Breast cancer Paternal Aunt    Cervical cancer Paternal Aunt    Breast cancer Cousin    Breast cancer Other    Social History   Socioeconomic History   Marital status: Widowed    Spouse name: Not on file   Number of children: 3   Years of education: Not on file   Highest education level: Not on file  Occupational History   Not on file  Tobacco Use   Smoking status: Former    Packs/day: 1.00    Years: 23.00    Additional pack years: 0.00    Total pack years: 23.00    Types: Cigarettes    Quit date: 11/20/1983    Years since quitting: 39.3   Smokeless tobacco: Never  Vaping Use   Vaping Use: Never used  Substance and Sexual Activity   Alcohol use: Yes    Comment: every monday   Drug use: No   Sexual activity: Never  Other  Topics Concern   Not on file  Social History Narrative   Lives at home with son.    Social Determinants of Health   Financial Resource Strain: Low Risk  (03/01/2023)   Overall Financial Resource Strain (CARDIA)    Difficulty of Paying Living Expenses: Not very hard  Food Insecurity: No Food Insecurity (03/01/2023)   Hunger Vital Sign    Worried About Running Out of Food in the Last Year:  Never true    Ran Out of Food in the Last Year: Never true  Transportation Needs: No Transportation Needs (03/01/2023)   PRAPARE - Administrator, Civil Service (Medical): No    Lack of Transportation (Non-Medical): No  Physical Activity: Insufficiently Active (03/01/2023)   Exercise Vital Sign    Days of Exercise per Week: 4 days    Minutes of Exercise per Session: 20 min  Stress: No Stress Concern Present (03/01/2023)   [employer] of Occupational Health - Occupational Stress Questionnaire    Feeling of Stress : Not at all  Social Connections: Unknown (03/01/2023)   Social Connection and Isolation Panel [NHANES]    Frequency of Communication with Friends and Family: More than three times a week    Frequency of Social Gatherings with Friends and Family: More than three times a week    Attends Religious Services: More than 4 times per year    Active Member of [employer] or Organizations: Yes    Attends Engineer, structural: More than 4 times per year    Marital Status: Not on file    Tobacco Counseling Counseling given: Not Answered   Clinical Intake:  Pre-visit preparation completed: Yes        Diabetes: Yes (Followed by pcp)  How often do you need to have someone help you when you read instructions, pamphlets, or other written materials from your doctor or pharmacy?: 1 - Never  Nutrition Risk Assessment: Has the patient had any N/V/D within the last 2 months?  No  Does the patient have any non-healing wounds?  No  Has the patient had any unintentional weight loss  or weight gain?  No   Diabetes: Is the patient diabetic?  Yes  If diabetic, was a CBG obtained today?  Yes  Did the patient bring in their glucometer from home?  No  How often do you monitor your CBG's? Daily.   Financial Strains and Diabetes Management: Are you having any financial strains with the device, your supplies or your medication? No .  Does the patient want to be seen by Chronic Care Management for management of their diabetes?  No  Would the patient like to be referred to a Nutritionist or for Diabetic Management?  No     Interpreter Needed?: No    Activities of Daily Living    03/01/2023    2:38 PM 08/23/2022   12:47 PM  In your present state of health, do you have any difficulty performing the following activities:  Hearing? 1   Comment Hearing aids   Vision? 0   Difficulty concentrating or making decisions? 0   Walking or climbing stairs? 0   Dressing or bathing? 0   Doing errands, shopping? 0 1  Preparing Food and eating ? N   Using the Toilet? N   In the past six months, have you accidently leaked urine? N   Do you have problems with loss of bowel control? N   Managing your Medications? N   Managing your Finances? N   Housekeeping or managing your Housekeeping? N     Patient Care Team: Dale Republic, MD as PCP - General (Internal Medicine) Antonieta Iba, MD as Consulting Physician (Cardiology)  Indicate any recent Medical Services you may have received from other than [facility] providers in the past year (date may be approximate).     Assessment:   This is a routine wellness examination for Jody Wang.  I connected with  TAIRA KNABE on 03/01/23 by a audio enabled telemedicine application and verified that I am speaking with the correct person using two identifiers.  Patient Location: Home  Provider Location: Office/Clinic  I discussed the limitations of evaluation and management by telemedicine. The patient expressed understanding and  agreed to proceed.   Hearing/Vision screen Hearing Screening - Comments:: Hearing aid, bilateral  Vision Screening - Comments:: Followed by [facility] Cataract extraction, bilateral They have regular follow up with the ophthalmologist  Dietary issues and exercise activities discussed: Current Exercise Habits: Home exercise routine, Type of exercise: walking, Intensity: Mild  Healthy diet   Goals Addressed             This Visit's Progress    Maintain Healthy Lifestyle       Healthy diet Stay active       Depression Screen    03/01/2023    2:55 PM 11/13/2022    1:11 PM 02/14/2022    1:37 PM 07/12/2021    3:53 PM 03/08/2021    4:04 PM 02/11/2021    1:11 PM 10/04/2020    2:12 PM  PHQ 2/9 Scores  PHQ - 2 Score 0 0 0 0 0 0 0  PHQ- 9 Score  0  2 0  0    Fall Risk    03/01/2023    2:49 PM 11/13/2022    1:11 PM 02/14/2022    1:36 PM 02/11/2021    1:19 PM 02/11/2020    1:35 PM  Fall Risk   Falls in the past year? 0 0 0 0 0  Number falls in past yr: 0 0 0 0   Injury with Fall? 0 0  0   Risk for fall due to :  Impaired balance/gait;History of fall(s)     Follow up Falls evaluation completed;Falls prevention discussed Falls evaluation completed Falls evaluation completed Falls evaluation completed Falls evaluation completed    FALL RISK PREVENTION PERTAINING TO THE HOME: Home free of loose throw rugs in walkways, pet beds, electrical cords, etc? Yes  Adequate lighting in your home to reduce risk of falls? Yes   ASSISTIVE DEVICES UTILIZED TO PREVENT FALLS: Life alert? No  Use of a cane, walker or w/c? No  Grab bars in the bathroom? Yes  Shower chair or bench in shower? No  Elevated toilet seat or a handicapped toilet? No   TIMED UP AND GO: Was the test performed? No .    Cognitive Function:    10/04/2020    5:01 PM 11/30/2015    4:37 PM  MMSE - Mini Mental State Exam  Orientation to time 3 5  Orientation to Place 5 5  Registration 3 3  Attention/ Calculation 5  5  Recall 3 3  Language- name 2 objects 2 2  Language- repeat 1 1  Language- follow 3 step command 2 3  Language- read & follow direction 1 1  Write a sentence 1 1  Copy design 1 1  Total score 27 30        03/01/2023    2:55 PM 02/11/2021    1:34 PM 02/11/2020    1:36 PM 02/10/2019   12:15 PM 02/08/2018    3:40 PM  6CIT Screen  What Year? 0 points 0 points 0 points 0 points 0 points  What month? 0 points 0 points 0 points 0 points 0 points  What time? 0 points 0 points 0 points 0 points 0 points  Count back from  20 0 points 0 points 0 points 0 points 0 points  Months in reverse 0 points 0 points 0 points 0 points 0 points  Repeat phrase 0 points 0 points 0 points 0 points 0 points  Total Score 0 points 0 points 0 points 0 points 0 points    Immunizations Immunization History  Administered Date(s) Administered   Fluad Quad(high Dose 65+) 07/12/2021   Influenza Split 08/24/2013, 08/20/2014   Influenza, High Dose Seasonal PF 09/25/2016, 07/11/2018, 08/26/2019, 08/16/2020   Influenza,inj,Quad PF,6+ Mos 07/09/2015   Influenza-Unspecified 08/10/2017   PFIZER(Purple Top)SARS-COV-2 Vaccination 01/02/2020, 01/28/2020, 08/06/2020   Pneumococcal Conjugate-13 10/08/2015   Pneumococcal Polysaccharide-23 07/01/2013   Zoster, Live 08/24/2013   Shingrix Completed?: No.    Education has been provided regarding the importance of this vaccine. Patient has been advised to call insurance company to determine out of pocket expense if they have not yet received this vaccine. Advised may also receive vaccine at local pharmacy or Health Dept. Verbalized acceptance and understanding.  Screening Tests Health Maintenance  Topic Date Due   FOOT EXAM  07/12/2022   Lung Cancer Screening  08/22/2022   COVID-19 Vaccine (4 - 2023-24 season) 03/17/2023 (Originally 07/07/2022)   Zoster Vaccines- Shingrix (1 of 2) 05/31/2023 (Originally 10/08/1965)   HEMOGLOBIN A1C  05/14/2023   INFLUENZA VACCINE  06/07/2023    Diabetic kidney evaluation - eGFR measurement  11/14/2023   Diabetic kidney evaluation - Urine ACR  12/08/2023   OPHTHALMOLOGY EXAM  01/09/2024   Medicare Annual Wellness (AWV)  02/29/2024   Pneumonia Vaccine 13+ Years old  Completed   DEXA SCAN  Completed   Hepatitis C Screening  Completed   HPV VACCINES  Aged Out   DTaP/Tdap/Td  Discontinued   Fecal DNA (Cologuard)  Discontinued    Health Maintenance Health Maintenance Due  Topic Date Due   FOOT EXAM  07/12/2022   Lung Cancer Screening  08/22/2022   Lung Cancer Screening:  deferred per patient.  Hepatitis C Screening: Completed 11/2015.  Vision Screening: Recommended annual ophthalmology exams for early detection of glaucoma and other disorders of the eye.  Dental Screening: Recommended annual dental exams for proper oral hygiene  Community Resource Referral / Chronic Care Management: CRR required this visit?  No   CCM required this visit?  No      Plan:     I have personally reviewed and noted the following in the patient's chart:   Medical and social history Use of alcohol, tobacco or illicit drugs  Current medications and supplements including opioid prescriptions. Patient is not currently taking opioid prescriptions. Functional ability and status Nutritional status Physical activity Advanced directives List of other physicians Hospitalizations, surgeries, and ER visits in previous 12 months Vitals Screenings to include cognitive, depression, and falls Referrals and appointments  In addition, I have reviewed and discussed with patient certain preventive protocols, quality metrics, and best practice recommendations. A written personalized care plan for preventive services as well as general preventive health recommendations were provided to patient.     Cathey Endow, LPN   1/61/0960

## 2023-03-01 NOTE — Patient Instructions (Addendum)
Jody Wang , Thank you for taking time to come for your Medicare Wellness Visit. I appreciate your ongoing commitment to your health goals. Please review the following plan we discussed and let me know if I can assist you in the future.   These are the goals we discussed:  Goals Addressed             This Visit's Progress    Maintain Healthy Lifestyle       Healthy diet Stay active         This is a list of the screening recommended for you and due dates:  Health Maintenance  Topic Date Due   Complete foot exam   07/12/2022   Screening for Lung Cancer  08/22/2022   COVID-19 Vaccine (4 - 2023-24 season) 03/17/2023*   Zoster (Shingles) Vaccine (1 of 2) 05/31/2023*   Hemoglobin A1C  05/14/2023   Flu Shot  06/07/2023   Yearly kidney function blood test for diabetes  11/14/2023   Yearly kidney health urinalysis for diabetes  12/08/2023   Eye exam for diabetics  01/09/2024   Medicare Annual Wellness Visit  02/29/2024   Pneumonia Vaccine  Completed   DEXA scan (bone density measurement)  Completed   Hepatitis C Screening: USPSTF Recommendation to screen - Ages 26-79 yo.  Completed   HPV Vaccine  Aged Out   DTaP/Tdap/Td vaccine  Discontinued   Cologuard (Stool DNA test)  Discontinued  *Topic was postponed. The date shown is not the original due date.    Conditions/risks identified: none new  Next appointment: Follow up in one year for your annual wellness visit    Preventive Care 65 Years and Older, Female Preventive care refers to lifestyle choices and visits with your health care provider that can promote health and wellness. What does preventive care include? A yearly physical exam. This is also called an annual well check. Dental exams once or twice a year. Routine eye exams. Ask your health care provider how often you should have your eyes checked. Personal lifestyle choices, including: Daily care of your teeth and gums. Regular physical activity. Eating a  healthy diet. Avoiding tobacco and drug use. Limiting alcohol use. Practicing safe sex. Taking low-dose aspirin every day. Taking vitamin and mineral supplements as recommended by your health care provider. What happens during an annual well check? The services and screenings done by your health care provider during your annual well check will depend on your age, overall health, lifestyle risk factors, and family history of disease. Counseling  Your health care provider may ask you questions about your: Alcohol use. Tobacco use. Drug use. Emotional well-being. Home and relationship well-being. Sexual activity. Eating habits. History of falls. Memory and ability to understand (cognition). Work and work Astronomer. Reproductive health. Screening  You may have the following tests or measurements: Height, weight, and BMI. Blood pressure. Lipid and cholesterol levels. These may be checked every 5 years, or more frequently if you are over 40 years old. Skin check. Lung cancer screening. You may have this screening every year starting at age 32 if you have a 30-pack-year history of smoking and currently smoke or have quit within the past 15 years. Fecal occult blood test (FOBT) of the stool. You may have this test every year starting at age 57. Flexible sigmoidoscopy or colonoscopy. You may have a sigmoidoscopy every 5 years or a colonoscopy every 10 years starting at age 31. Hepatitis C blood test. Hepatitis B blood test. Sexually transmitted disease (  STD) testing. Diabetes screening. This is done by checking your blood sugar (glucose) after you have not eaten for a while (fasting). You may have this done every 1-3 years. Bone density scan. This is done to screen for osteoporosis. You may have this done starting at age 14. Mammogram. This may be done every 1-2 years. Talk to your health care provider about how often you should have regular mammograms. Talk with your health care  provider about your test results, treatment options, and if necessary, the need for more tests. Vaccines  Your health care provider may recommend certain vaccines, such as: Influenza vaccine. This is recommended every year. Tetanus, diphtheria, and acellular pertussis (Tdap, Td) vaccine. You may need a Td booster every 10 years. Zoster vaccine. You may need this after age 60. Pneumococcal 13-valent conjugate (PCV13) vaccine. One dose is recommended after age 102. Pneumococcal polysaccharide (PPSV23) vaccine. One dose is recommended after age 26. Talk to your health care provider about which screenings and vaccines you need and how often you need them. This information is not intended to replace advice given to you by your health care provider. Make sure you discuss any questions you have with your health care provider. Document Released: 11/19/2015 Document Revised: 07/12/2016 Document Reviewed: 08/24/2015 Elsevier Interactive Patient Education  2017 [employer] Prevention in the Home Falls can cause injuries. They can happen to people of all ages. There are many things you can do to make your home safe and to help prevent falls. What can I do on the outside of my home? Regularly fix the edges of walkways and driveways and fix any cracks. Remove anything that might make you trip as you walk through a door, such as a raised step or threshold. Trim any bushes or trees on the path to your home. Use bright outdoor lighting. Clear any walking paths of anything that might make someone trip, such as rocks or tools. Regularly check to see if handrails are loose or broken. Make sure that both sides of any steps have handrails. Any raised decks and porches should have guardrails on the edges. Have any leaves, snow, or ice cleared regularly. Use sand or salt on walking paths during winter. Clean up any spills in your garage right away. This includes oil or grease spills. What can I do in the  bathroom? Use night lights. Install grab bars by the toilet and in the tub and shower. Do not use towel bars as grab bars. Use non-skid mats or decals in the tub or shower. If you need to sit down in the shower, use a plastic, non-slip stool. Keep the floor dry. Clean up any water that spills on the floor as soon as it happens. Remove soap buildup in the tub or shower regularly. Attach bath mats securely with double-sided non-slip rug tape. Do not have throw rugs and other things on the floor that can make you trip. What can I do in the bedroom? Use night lights. Make sure that you have a light by your bed that is easy to reach. Do not use any sheets or blankets that are too big for your bed. They should not hang down onto the floor. Have a firm chair that has side arms. You can use this for support while you get dressed. Do not have throw rugs and other things on the floor that can make you trip. What can I do in the kitchen? Clean up any spills right away. Avoid walking on  wet floors. Keep items that you use a lot in easy-to-reach places. If you need to reach something above you, use a strong step stool that has a grab bar. Keep electrical cords out of the way. Do not use floor polish or wax that makes floors slippery. If you must use wax, use non-skid floor wax. Do not have throw rugs and other things on the floor that can make you trip. What can I do with my stairs? Do not leave any items on the stairs. Make sure that there are handrails on both sides of the stairs and use them. Fix handrails that are broken or loose. Make sure that handrails are as long as the stairways. Check any carpeting to make sure that it is firmly attached to the stairs. Fix any carpet that is loose or worn. Avoid having throw rugs at the top or bottom of the stairs. If you do have throw rugs, attach them to the floor with carpet tape. Make sure that you have a light switch at the top of the stairs and the  bottom of the stairs. If you do not have them, ask someone to add them for you. What else can I do to help prevent falls? Wear shoes that: Do not have high heels. Have rubber bottoms. Are comfortable and fit you well. Are closed at the toe. Do not wear sandals. If you use a stepladder: Make sure that it is fully opened. Do not climb a closed stepladder. Make sure that both sides of the stepladder are locked into place. Ask someone to hold it for you, if possible. Clearly mark and make sure that you can see: Any grab bars or handrails. First and last steps. Where the edge of each step is. Use tools that help you move around (mobility aids) if they are needed. These include: Canes. Walkers. Scooters. Crutches. Turn on the lights when you go into a dark area. Replace any light bulbs as soon as they burn out. Set up your furniture so you have a clear path. Avoid moving your furniture around. If any of your floors are uneven, fix them. If there are any pets around you, be aware of where they are. Review your medicines with your doctor. Some medicines can make you feel dizzy. This can increase your chance of falling. Ask your doctor what other things that you can do to help prevent falls. This information is not intended to replace advice given to you by your health care provider. Make sure you discuss any questions you have with your health care provider. Document Released: 08/19/2009 Document Revised: 03/30/2016 Document Reviewed: 11/27/2014 Elsevier Interactive Patient Education  2017 [employer].

## 2023-03-06 ENCOUNTER — Encounter (INDEPENDENT_AMBULATORY_CARE_PROVIDER_SITE_OTHER): Payer: Self-pay | Admitting: Vascular Surgery

## 2023-03-06 ENCOUNTER — Encounter (INDEPENDENT_AMBULATORY_CARE_PROVIDER_SITE_OTHER): Payer: Self-pay

## 2023-03-12 ENCOUNTER — Other Ambulatory Visit: Payer: Self-pay | Admitting: Pharmacist

## 2023-03-12 NOTE — Progress Notes (Signed)
Care Coordination Call  Received phone call from patient regarding Trulicity patient assistance. She reports that she finished her current supply. Provided phone number for [facility]; she will call to follow up on shipping.   [name], PharmD, BCACP, CPP [facility] [phone]

## 2023-03-13 ENCOUNTER — Encounter (INDEPENDENT_AMBULATORY_CARE_PROVIDER_SITE_OTHER): Payer: Self-pay

## 2023-03-13 ENCOUNTER — Encounter (INDEPENDENT_AMBULATORY_CARE_PROVIDER_SITE_OTHER): Payer: Self-pay | Admitting: Vascular Surgery

## 2023-03-13 ENCOUNTER — Telehealth: Payer: Self-pay | Admitting: Internal Medicine

## 2023-03-13 NOTE — Telephone Encounter (Signed)
Received and filed for appt 03/15/23 so note can be sent with order.

## 2023-03-13 NOTE — Telephone Encounter (Signed)
Patient called and said that [facility] in [place] will be faxing to office about patient's diabetic supplies. She states that's where she gets her supplies from.

## 2023-03-15 ENCOUNTER — Encounter: Payer: Self-pay | Admitting: Internal Medicine

## 2023-03-15 ENCOUNTER — Ambulatory Visit (INDEPENDENT_AMBULATORY_CARE_PROVIDER_SITE_OTHER): Payer: Medicare Other | Admitting: Internal Medicine

## 2023-03-15 VITALS — BP 126/72 | HR 70 | Temp 98.0°F | Resp 16 | Ht 61.0 in | Wt 135.8 lb

## 2023-03-15 DIAGNOSIS — I25118 Atherosclerotic heart disease of native coronary artery with other forms of angina pectoris: Secondary | ICD-10-CM

## 2023-03-15 DIAGNOSIS — E7800 Pure hypercholesterolemia, unspecified: Secondary | ICD-10-CM

## 2023-03-15 DIAGNOSIS — E1165 Type 2 diabetes mellitus with hyperglycemia: Secondary | ICD-10-CM | POA: Diagnosis not present

## 2023-03-15 DIAGNOSIS — Z7985 Long-term (current) use of injectable non-insulin antidiabetic drugs: Secondary | ICD-10-CM

## 2023-03-15 DIAGNOSIS — R9389 Abnormal findings on diagnostic imaging of other specified body structures: Secondary | ICD-10-CM | POA: Diagnosis not present

## 2023-03-15 DIAGNOSIS — Z7984 Long term (current) use of oral hypoglycemic drugs: Secondary | ICD-10-CM

## 2023-03-15 DIAGNOSIS — E039 Hypothyroidism, unspecified: Secondary | ICD-10-CM | POA: Diagnosis not present

## 2023-03-15 DIAGNOSIS — D649 Anemia, unspecified: Secondary | ICD-10-CM | POA: Diagnosis not present

## 2023-03-15 DIAGNOSIS — F320 Major depressive disorder, single episode, mild: Secondary | ICD-10-CM

## 2023-03-15 DIAGNOSIS — D473 Essential (hemorrhagic) thrombocythemia: Secondary | ICD-10-CM

## 2023-03-15 DIAGNOSIS — K219 Gastro-esophageal reflux disease without esophagitis: Secondary | ICD-10-CM

## 2023-03-15 DIAGNOSIS — I779 Disorder of arteries and arterioles, unspecified: Secondary | ICD-10-CM

## 2023-03-15 DIAGNOSIS — Z0000 Encounter for general adult medical examination without abnormal findings: Secondary | ICD-10-CM

## 2023-03-15 DIAGNOSIS — I700 Atherosclerosis of aorta: Secondary | ICD-10-CM

## 2023-03-15 DIAGNOSIS — M25569 Pain in unspecified knee: Secondary | ICD-10-CM

## 2023-03-15 LAB — HM DIABETES FOOT EXAM

## 2023-03-15 MED ORDER — ROSUVASTATIN CALCIUM 40 MG PO TABS: 40.0000 mg | ORAL_TABLET | Freq: Every day | ORAL | 1 refills | Status: DC

## 2023-03-15 NOTE — Assessment & Plan Note (Signed)
Physical today 03/15/23.  cologuard 02/24/19.  Mammogram - 12/28/22 - birads I.

## 2023-03-15 NOTE — Progress Notes (Signed)
Subjective:    Patient ID: Jody Wang, adult    DOB: 1946-06-19, 77 y.o.   MRN: [id number]  Patient here for  Chief Complaint  Patient presents with   Annual Exam    HPI Here for her physical exam.  Reports she is doing relatively well.  Tries to stay active.  No chest pain or sob reported.  No cough or congestion.  No acid reflux reported.  No abdominal pain or bowel change reported.     Past Medical History:  Diagnosis Date   AK (actinic keratosis)    Anemia    Aortic atherosclerosis (HCC)    Carotid artery disease (HCC)    Carotid bruit    Chest pain 01/28/2017   Complication of anesthesia    hard time waking up x 1 surgery   Depression    Diabetes mellitus without complication (HCC)    GERD (gastroesophageal reflux disease)    Heart murmur    Hypercholesterolemia    Hypothyroidism    Lung nodule, multiple    Osteoarthritis of knee    Shingles    Wears dentures    partial lower   Past Surgical History:  Procedure Laterality Date   ABDOMINAL HYSTERECTOMY  1985   ovaries not removed   BREAST CYST EXCISION Right    BREAST CYST EXCISION Left    CATARACT EXTRACTION W/PHACO Right 02/16/2016   Procedure: CATARACT EXTRACTION PHACO AND INTRAOCULAR LENS PLACEMENT (IOC) RIGHT ;  Surgeon: Lockie Mola, MD;  Location: [facility];  Service: Ophthalmology;  Laterality: Right;  DIABETIC - oral meds   CATARACT EXTRACTION W/PHACO Left 03/15/2016   Procedure: CATARACT EXTRACTION PHACO AND INTRAOCULAR LENS PLACEMENT (IOC)left eye;  Surgeon: Lockie Mola, MD;  Location: [facility];  Service: Ophthalmology;  Laterality: Left;  DIABETIC - oral meds   FOOT SURGERY Bilateral    INNER EAR SURGERY Left    KNEE ARTHROPLASTY Right 03/17/2022   Procedure: COMPUTER ASSISTED TOTAL KNEE ARTHROPLASTY;  Surgeon: Donato Heinz, MD;  Location: [facility];  Service: Orthopedics;  Laterality: Right;   KNEE ARTHROPLASTY Left 08/23/2022   Procedure: COMPUTER  ASSISTED TOTAL KNEE ARTHROPLASY;  Surgeon: Donato Heinz, MD;  Location: [facility];  Service: Orthopedics;  Laterality: Left;   Family History  Problem Relation Age of Onset   Diabetes Father    Coronary artery disease Father        s/p CABG   Hypertension Father    Hypercholesterolemia Father    Hypertension Brother    Diabetes Brother    Hypertension Sister    Glaucoma Sister    Diabetes Other        niece   Breast cancer Maternal Aunt    Breast cancer Paternal Aunt    Cervical cancer Paternal Aunt    Breast cancer Cousin    Breast cancer Other    Social History   Socioeconomic History   Marital status: Widowed    Spouse name: Not on file   Number of children: 3   Years of education: Not on file   Highest education level: Not on file  Occupational History   Not on file  Tobacco Use   Smoking status: Former    Packs/day: 1.00    Years: 23.00    Additional pack years: 0.00    Total pack years: 23.00    Types: Cigarettes    Quit date: 11/20/1983    Years since quitting: 39.3   Smokeless tobacco: Never  Vaping  Use   Vaping Use: Never used  Substance and Sexual Activity   Alcohol use: Yes    Comment: every monday   Drug use: No   Sexual activity: Never  Other Topics Concern   Not on file  Social History Narrative   Lives at home with son.    Social Determinants of Health   Financial Resource Strain: Low Risk  (03/01/2023)   Overall Financial Resource Strain (CARDIA)    Difficulty of Paying Living Expenses: Not very hard  Food Insecurity: No Food Insecurity (03/01/2023)   Hunger Vital Sign    Worried About Running Out of Food in the Last Year: Never true    Ran Out of Food in the Last Year: Never true  Transportation Needs: No Transportation Needs (03/01/2023)   PRAPARE - Administrator, Civil Service (Medical): No    Lack of Transportation (Non-Medical): No  Physical Activity: Insufficiently Active (03/01/2023)   Exercise Vital Sign    Days of  Exercise per Week: 4 days    Minutes of Exercise per Session: 20 min  Stress: No Stress Concern Present (03/01/2023)   [employer] of Occupational Health - Occupational Stress Questionnaire    Feeling of Stress : Not at all  Social Connections: Unknown (03/01/2023)   Social Connection and Isolation Panel [NHANES]    Frequency of Communication with Friends and Family: More than three times a week    Frequency of Social Gatherings with Friends and Family: More than three times a week    Attends Religious Services: More than 4 times per year    Active Member of [employer] or Organizations: Yes    Attends Engineer, structural: More than 4 times per year    Marital Status: Not on file     Review of Systems  Constitutional:  Negative for appetite change and unexpected weight change.  HENT:  Negative for congestion, sinus pressure and sore throat.   Eyes:  Negative for pain and visual disturbance.  Respiratory:  Negative for cough, chest tightness and shortness of breath.   Cardiovascular:  Negative for chest pain and palpitations.  Gastrointestinal:  Negative for abdominal pain, diarrhea, nausea and vomiting.  Genitourinary:  Negative for difficulty urinating and dysuria.  Musculoskeletal:  Negative for joint swelling and myalgias.  Skin:  Negative for rash and wound.  Neurological:  Negative for dizziness and headaches.  Hematological:  Negative for adenopathy. Does not bruise/bleed easily.  Psychiatric/Behavioral:  Negative for agitation and dysphoric mood.        Objective:     BP 126/72   Pulse 70   Temp 98 F (36.7 C)   Resp 16   Ht 5\' 1"  (1.549 m)   Wt 135 lb 12.8 oz (61.6 kg)   SpO2 98%   BMI 25.66 kg/m  Wt Readings from Last 3 Encounters:  03/15/23 135 lb 12.8 oz (61.6 kg)  03/01/23 131 lb (59.4 kg)  11/13/22 131 lb 9.6 oz (59.7 kg)    Physical Exam Vitals reviewed.  Constitutional:      General: He is not in acute distress.    Appearance: Normal  appearance. He is well-developed.  HENT:     Head: Normocephalic and atraumatic.     Right Ear: External ear normal.     Left Ear: External ear normal.  Eyes:     General: No scleral icterus.       Right eye: No discharge.        Left  eye: No discharge.     Conjunctiva/sclera: Conjunctivae normal.  Neck:     Thyroid: No thyromegaly.  Cardiovascular:     Rate and Rhythm: Normal rate and regular rhythm.  Pulmonary:     Effort: No tachypnea, accessory muscle usage or respiratory distress.     Breath sounds: Normal breath sounds. No decreased breath sounds or wheezing.  Chest:  Breasts:    Right: No inverted nipple, mass, nipple discharge or tenderness (no axillary adenopathy).     Left: No inverted nipple, mass, nipple discharge or tenderness (no axilarry adenopathy).  Abdominal:     General: Bowel sounds are normal.     Palpations: Abdomen is soft.     Tenderness: There is no abdominal tenderness.  Musculoskeletal:        General: No swelling or tenderness.     Cervical back: Neck supple.  Lymphadenopathy:     Cervical: No cervical adenopathy.  Skin:    Findings: No erythema or rash.  Neurological:     Mental Status: He is alert and oriented to person, place, and time.  Psychiatric:        Mood and Affect: Mood normal.        Behavior: Behavior normal.      Outpatient Encounter Medications as of 03/15/2023  Medication Sig   acetaminophen (TYLENOL) 325 MG tablet Take 650 mg by mouth every 6 (six) hours as needed.   blood glucose meter kit and supplies KIT Dispense based on patient and insurance preference. Use to check blood sugars twice daily. DX E11.9   celecoxib (CELEBREX) 200 MG capsule Take 1 capsule (200 mg total) by mouth 2 (two) times daily.   cholecalciferol (VITAMIN D) 1000 units tablet Take 1,000 Units by mouth daily.   Dulaglutide (TRULICITY) 4.5 MG/0.5ML SOPN Inject 4.5 mg as directed once a week.   ferrous sulfate 325 (65 FE) MG EC tablet Take 325 mg by mouth  daily.   FLUoxetine (PROZAC) 20 MG capsule TAKE 1 CAPSULE BY MOUTH DAILY   fluticasone (FLONASE) 50 MCG/ACT nasal spray USE 2 SPRAYS IN BOTH NOSTRILS  DAILY   glucose blood test strip Use as instructed to check blood sugars to check blood sugars twice daily. Dx E11.9   levocetirizine (XYZAL) 5 MG tablet TAKE 1 TABLET BY MOUTH DAILY   levothyroxine (SYNTHROID) 88 MCG tablet TAKE 1 TABLET BY MOUTH DAILY   metFORMIN (GLUCOPHAGE-XR) 500 MG 24 hr tablet TAKE 1 TABLET BY MOUTH TWICE  DAILY   pantoprazole (PROTONIX) 40 MG tablet TAKE 1 TABLET BY MOUTH DAILY   rosuvastatin (CRESTOR) 40 MG tablet Take 1 tablet (40 mg total) by mouth daily.   traZODone (DESYREL) 50 MG tablet TAKE 1 TABLET BY MOUTH DAILY   vitamin B-12 (CYANOCOBALAMIN) 1000 MCG tablet Take 1,000 mcg by mouth daily.   [DISCONTINUED] rosuvastatin (CRESTOR) 40 MG tablet TAKE 1 TABLET BY MOUTH DAILY   No facility-administered encounter medications on file as of 03/15/2023.     Lab Results  Component Value Date   WBC 10.1 03/15/2023   HGB 13.3 03/15/2023   HCT 40.0 03/15/2023   PLT 320.0 03/15/2023   GLUCOSE 115 (H) 03/15/2023   CHOL 108 03/15/2023   TRIG 97.0 03/15/2023   HDL 47.10 03/15/2023   LDLDIRECT 188.8 06/30/2013   LDLCALC 41 03/15/2023   ALT 9 03/15/2023   AST 14 03/15/2023   NA 133 (L) 03/15/2023   K 4.7 03/15/2023   CL 100 03/15/2023   CREATININE 0.80 03/15/2023  BUN 16 03/15/2023   CO2 23 03/15/2023   TSH 2.90 11/13/2022   HGBA1C 8.3 (H) 03/15/2023   MICROALBUR 1.1 12/07/2022    MM 3D SCREEN BREAST BILATERAL  Result Date: 12/28/2022 CLINICAL DATA:  Screening. EXAM: DIGITAL SCREENING BILATERAL MAMMOGRAM WITH TOMOSYNTHESIS AND CAD TECHNIQUE: Bilateral screening digital craniocaudal and mediolateral oblique mammograms were obtained. Bilateral screening digital breast tomosynthesis was performed. The images were evaluated with computer-aided detection. COMPARISON:  Previous exam(s). ACR Breast Density Category b:  There are scattered areas of fibroglandular density. FINDINGS: There are no findings suspicious for malignancy. IMPRESSION: No mammographic evidence of malignancy. A result letter of this screening mammogram will be mailed directly to the patient. RECOMMENDATION: Screening mammogram in one year. (Code:[id number]-B-01Y) BI-RADS CATEGORY  1: Negative. Electronically Signed   By: Harmon Pier M.D.   On: 12/28/2022 17:39       Assessment & Plan:  Routine general medical examination at a health care facility  Health care maintenance Assessment & Plan: Physical today 03/15/23.  cologuard 02/24/19.  Mammogram - 12/28/22 - birads I.     Type 2 diabetes mellitus with hyperglycemia, without long-term current use of insulin (HCC) Assessment & Plan: Continues on trulicity and metformin.   Last a1c 8.3.  had previous discussed adjusting medication. She desired to work on diet and exercise before changing medication.  Discussed low carb diet and exercise as tolerated. Brought in no sugar readings.  Check met b and A1c today.   Orders: -     Hemoglobin A1c -     Basic metabolic panel; Future -     Hemoglobin A1c; Future  Anemia, unspecified type Assessment & Plan: With IDA.  On iron.  Have discussed GI evaluation.  cologuard 02/2019- negative.  Has declined any further GI w/up.   Orders: -     CBC with Differential/Platelet -     Ferritin -     CBC with Differential/Platelet; Future -     Ferritin; Future  Hypothyroidism, unspecified type Assessment & Plan: On thyroid replacement.  Follow tsh.    Hypercholesterolemia Assessment & Plan: Continue crestor.  Low cholesterol diet and exercise.  Follow lipid panel and liver function tests.   Orders: -     Lipid panel -     Hepatic function panel -     Basic metabolic panel -     Hepatic function panel; Future -     Lipid panel; Future  Abnormal CT of the chest Assessment & Plan: Previously saw Dr Thelma Barge.  Overdue f/u. Have discussed need for f/u. Has  declined f/u chest CT.     Aortic atherosclerosis (HCC) Assessment & Plan: Continue crestor.    Bilateral carotid artery disease, unspecified type (HCC) Assessment & Plan: Previous left carotid 60-79% and right 40-50%.  Continue crestor.  Needs f/u with [facility] to monitor. Discussed again with her today.  Continue risk factor modification.  Continue crestor. She declines further evaluation.    Coronary artery disease of native artery of native heart with stable angina pectoris [facility]) Assessment & Plan: Previously seen on chest CT.  Saw Dr Mariah Milling.  No chest pain.  Continue risk factor modification.  Continue crestor.    Gastroesophageal reflux disease, unspecified whether esophagitis present Assessment & Plan: Continue protonix.  No upper symptoms reported.    Knee pain, unspecified chronicity, unspecified laterality Assessment & Plan: S/p Left total knee arthroplasty using computer-assisted navigation (08/23/22 - Dr Ernest Pine). Doing well. Continue f/u with orho.    Major  depressive disorder, single episode, mild (HCC) Assessment & Plan: Overall appears to be doing well and handling stress.  Continue prozac.  Follow.     Essential (hemorrhagic) thrombocythemia (HCC)  Other orders -     Rosuvastatin Calcium; Take 1 tablet (40 mg total) by mouth daily.  Dispense: 100 tablet; Refill: 1   Previously elevated platelet count.  Check cbc today.    Dale [place], MD

## 2023-03-16 LAB — HEMOGLOBIN A1C: Hgb A1c MFr Bld: 8.3 % — ABNORMAL HIGH (ref 4.6–6.5)

## 2023-03-16 LAB — BASIC METABOLIC PANEL
BUN: 16 mg/dL (ref 6–23)
CO2: 23 mEq/L (ref 19–32)
Calcium: 9.1 mg/dL (ref 8.4–10.5)
Chloride: 100 mEq/L (ref 96–112)
Creatinine, Ser: 0.8 mg/dL (ref 0.40–1.20)
GFR: 71.06 mL/min (ref >60.00)
Glucose, Bld: 115 mg/dL — ABNORMAL HIGH (ref 70–99)
Potassium: 4.7 mEq/L (ref 3.5–5.1)
Sodium: 133 mEq/L — ABNORMAL LOW (ref 135–145)

## 2023-03-16 LAB — CBC WITH DIFFERENTIAL/PLATELET
Basophils Absolute: 0 10*3/uL (ref 0.0–0.1)
Basophils Relative: 0.4 % (ref 0.0–3.0)
Eosinophils Absolute: 0.4 10*3/uL (ref 0.0–0.7)
Eosinophils Relative: 3.6 % (ref 0.0–5.0)
HCT: 40 % (ref 36.0–46.0)
Hemoglobin: 13.3 g/dL (ref 12.0–15.0)
Lymphocytes Relative: 25.7 % (ref 12.0–46.0)
Lymphs Abs: 2.6 10*3/uL (ref 0.7–4.0)
MCHC: 33.2 g/dL (ref 30.0–36.0)
MCV: 84.6 fl (ref 78.0–100.0)
Monocytes Absolute: 0.8 10*3/uL (ref 0.1–1.0)
Monocytes Relative: 8 % (ref 3.0–12.0)
Neutro Abs: 6.3 10*3/uL (ref 1.4–7.7)
Neutrophils Relative %: 62.3 % (ref 43.0–77.0)
Platelets: 320 10*3/uL (ref 150.0–400.0)
RBC: 4.72 Mil/uL (ref 3.87–5.11)
RDW: 15.8 % — ABNORMAL HIGH (ref 11.5–15.5)
WBC: 10.1 10*3/uL (ref 4.0–10.5)

## 2023-03-16 LAB — LIPID PANEL
Cholesterol: 108 mg/dL (ref 0–200)
HDL: 47.1 mg/dL (ref >39.00)
LDL Cholesterol: 41 mg/dL (ref 0–99)
NonHDL: 60.77
Total CHOL/HDL Ratio: 2
Triglycerides: 97 mg/dL (ref 0.0–149.0)
VLDL: 19.4 mg/dL (ref 0.0–40.0)

## 2023-03-16 LAB — HEPATIC FUNCTION PANEL
ALT: 9 U/L (ref 0–35)
AST: 14 U/L (ref 0–37)
Albumin: 4.2 g/dL (ref 3.5–5.2)
Alkaline Phosphatase: 73 U/L (ref 39–117)
Bilirubin, Direct: 0.1 mg/dL (ref 0.0–0.3)
Total Bilirubin: 0.3 mg/dL (ref 0.2–1.2)
Total Protein: 7 g/dL (ref 6.0–8.3)

## 2023-03-16 LAB — FERRITIN: Ferritin: 13.8 ng/mL (ref 10.0–291.0)

## 2023-03-18 ENCOUNTER — Encounter: Payer: Self-pay | Admitting: Internal Medicine

## 2023-03-18 NOTE — Assessment & Plan Note (Signed)
S/p Left total knee arthroplasty using computer-assisted navigation (08/23/22 - Dr Hooten). Doing well. Continue f/u with orho.  

## 2023-03-18 NOTE — Assessment & Plan Note (Signed)
Previous left carotid 60-79% and right 40-50%.  Continue crestor.  Needs f/u with [facility] to monitor. Discussed again with her today.  Continue risk factor modification.  Continue crestor. She declines further evaluation.

## 2023-03-18 NOTE — Assessment & Plan Note (Signed)
Continue protonix.  No upper symptoms reported.  

## 2023-03-18 NOTE — Assessment & Plan Note (Signed)
Previously seen on chest CT.  Saw Dr Gollan.  No chest pain.  Continue risk factor modification.  Continue crestor.  °

## 2023-03-18 NOTE — Assessment & Plan Note (Signed)
On thyroid replacement.  Follow tsh.  

## 2023-03-18 NOTE — Assessment & Plan Note (Signed)
With IDA.  On iron.  Have discussed GI evaluation.  cologuard 02/2019- negative.  Has declined any further GI w/up.  

## 2023-03-18 NOTE — Assessment & Plan Note (Signed)
Overall appears to be doing well and handling stress.  Continue prozac.  Follow.   

## 2023-03-18 NOTE — Assessment & Plan Note (Signed)
Continues on trulicity and metformin.   Last a1c 8.3.  had previous discussed adjusting medication. She desired to work on diet and exercise before changing medication.  Discussed low carb diet and exercise as tolerated. Brought in no sugar readings.  Check met b and A1c today.

## 2023-03-18 NOTE — Assessment & Plan Note (Signed)
Continue crestor.  Low cholesterol diet and exercise.  Follow lipid panel and liver function tests.  

## 2023-03-18 NOTE — Assessment & Plan Note (Signed)
Continue crestor 

## 2023-03-18 NOTE — Assessment & Plan Note (Signed)
Previously saw Dr Thelma Barge.  Overdue f/u. Have discussed need for f/u. Has declined f/u chest CT.

## 2023-03-20 ENCOUNTER — Telehealth: Payer: Self-pay

## 2023-03-20 DIAGNOSIS — E871 Hypo-osmolality and hyponatremia: Secondary | ICD-10-CM

## 2023-03-20 NOTE — Telephone Encounter (Signed)
-----   Message from Dale Kingsland, MD sent at 03/18/2023  8:05 AM EDT ----- Notify - hgb has improved and is now wnl. Iron stores have improved, but still low normal. Continue iron as she is doing.  We will follow.  Cholesterol levels look good.  Overall sugar control remains elevated.  A1c 8.3.  continue low carb diet and exercise.  Confirm taking trulicity 4.5 as ordered.  Also confirm on metformin XR 500mg  bid.  If so, then would like to increase to two 500mg  tablets bid.  Check and record sugars and send in readings.  Schedule f/u with Catie for diabetes management. Previously had problems with Jardiance (increased urinary tract infections).  Sodium slightly decreased.  Confirm not drinking an excessive amount of free water.  Recheck sodium in 10 days.  Kidney function tests and liver function tests are wnl.

## 2023-03-20 NOTE — Telephone Encounter (Signed)
Patient called office back, in the middle of reading note, call was disconnected.

## 2023-03-20 NOTE — Telephone Encounter (Signed)
Pt called and I read the message to her and she is taking Trulicity and pt has scheduled with Catie already.  Also pt is not drinking a lot of free water

## 2023-03-21 NOTE — Telephone Encounter (Signed)
Noted. Sodium lab ordered.

## 2023-03-21 NOTE — Addendum Note (Signed)
Addended by: Rita Ohara D on: 03/21/2023 07:23 AM   Modules accepted: Orders

## 2023-03-27 ENCOUNTER — Ambulatory Visit (INDEPENDENT_AMBULATORY_CARE_PROVIDER_SITE_OTHER): Payer: Medicare Other | Admitting: Nurse Practitioner

## 2023-03-27 ENCOUNTER — Encounter: Payer: Self-pay | Admitting: Nurse Practitioner

## 2023-03-27 VITALS — BP 116/74 | HR 75 | Temp 98.0°F | Ht 61.0 in | Wt 130.8 lb

## 2023-03-27 DIAGNOSIS — J40 Bronchitis, not specified as acute or chronic: Secondary | ICD-10-CM

## 2023-03-27 MED ORDER — AMOXICILLIN-POT CLAVULANATE 875-125 MG PO TABS: 1.0000 | ORAL_TABLET | Freq: Two times a day (BID) | ORAL | 0 refills | Status: AC

## 2023-03-27 MED ORDER — PREDNISONE 20 MG PO TABS: 20.0000 mg | ORAL_TABLET | Freq: Every day | ORAL | 0 refills | Status: AC

## 2023-03-27 MED ORDER — BENZONATATE 100 MG PO CAPS: 100.0000 mg | ORAL_CAPSULE | Freq: Two times a day (BID) | ORAL | 0 refills | Status: DC | PRN

## 2023-03-27 NOTE — Patient Instructions (Addendum)
Sent Augmentin, prednisone and tesslon to the pharmacy. Increase fluid intake. If symptoms not improving call the office for further evaluation

## 2023-03-27 NOTE — Progress Notes (Unsigned)
Established Patient Office Visit  Subjective:  Patient ID: Jody Wang, adult    DOB: 1946/04/04  Age: 77 y.o. MRN: [id number]  CC:  Chief Complaint  Patient presents with   Acute Visit     Cough with clear phlemgh, sneezing, headache, chest congestion x 1 week    HPI  Jody Wang presents for:  HPI   Past Medical History:  Diagnosis Date   AK (actinic keratosis)    Anemia    Aortic atherosclerosis (HCC)    Carotid artery disease (HCC)    Carotid bruit    Chest pain 01/28/2017   Complication of anesthesia    hard time waking up x 1 surgery   Depression    Diabetes mellitus without complication (HCC)    GERD (gastroesophageal reflux disease)    Heart murmur    Hypercholesterolemia    Hypothyroidism    Lung nodule, multiple    Osteoarthritis of knee    Shingles    Wears dentures    partial lower    Past Surgical History:  Procedure Laterality Date   ABDOMINAL HYSTERECTOMY  1985   ovaries not removed   BREAST CYST EXCISION Right    BREAST CYST EXCISION Left    CATARACT EXTRACTION W/PHACO Right 02/16/2016   Procedure: CATARACT EXTRACTION PHACO AND INTRAOCULAR LENS PLACEMENT (IOC) RIGHT ;  Surgeon: Lockie Mola, MD;  Location: [facility];  Service: Ophthalmology;  Laterality: Right;  DIABETIC - oral meds   CATARACT EXTRACTION W/PHACO Left 03/15/2016   Procedure: CATARACT EXTRACTION PHACO AND INTRAOCULAR LENS PLACEMENT (IOC)left eye;  Surgeon: Lockie Mola, MD;  Location: [facility];  Service: Ophthalmology;  Laterality: Left;  DIABETIC - oral meds   FOOT SURGERY Bilateral    INNER EAR SURGERY Left    KNEE ARTHROPLASTY Right 03/17/2022   Procedure: COMPUTER ASSISTED TOTAL KNEE ARTHROPLASTY;  Surgeon: Donato Heinz, MD;  Location: [facility] OR[facility];  Service: Orthopedics;  Laterality: Right;   KNEE ARTHROPLASTY Left 08/23/2022   Procedure: COMPUTER ASSISTED TOTAL KNEE ARTHROPLASY;  Surgeon: Donato Heinz, MD;  Location: [facility]  [facility];  Service: Orthopedics;  Laterality: Left;    Family History  Problem Relation Age of Onset   Diabetes Father    Coronary artery disease Father        s/p CABG   Hypertension Father    Hypercholesterolemia Father    Hypertension Brother    Diabetes Brother    Hypertension Sister    Glaucoma Sister    Diabetes Other        niece   Breast cancer Maternal Aunt    Breast cancer Paternal Aunt    Cervical cancer Paternal Aunt    Breast cancer Cousin    Breast cancer Other     Social History   Socioeconomic History   Marital status: Widowed    Spouse name: Not on file   Number of children: 3   Years of education: Not on file   Highest education level: Some college, no degree  Occupational History   Not on file  Tobacco Use   Smoking status: Former    Packs/day: 1.00    Years: 23.00    Additional pack years: 0.00    Total pack years: 23.00    Types: Cigarettes    Quit date: 11/20/1983    Years since quitting: 39.3   Smokeless tobacco: Never  Vaping Use   Vaping Use: Never used  Substance and Sexual Activity   Alcohol use:  Yes    Comment: every monday   Drug use: No   Sexual activity: Never  Other Topics Concern   Not on file  Social History Narrative   Lives at home with son.    Social Determinants of Health   Financial Resource Strain: Low Risk  (03/26/2023)   Overall Financial Resource Strain (CARDIA)    Difficulty of Paying Living Expenses: Not hard at all  Food Insecurity: No Food Insecurity (03/26/2023)   Hunger Vital Sign    Worried About Running Out of Food in the Last Year: Never true    Ran Out of Food in the Last Year: Never true  Transportation Needs: No Transportation Needs (03/26/2023)   PRAPARE - Administrator, Civil Service (Medical): No    Lack of Transportation (Non-Medical): No  Physical Activity: Inactive (03/26/2023)   Exercise Vital Sign    Days of Exercise per Week: 0 days    Minutes of Exercise per Session: 20 min   Stress: No Stress Concern Present (03/26/2023)   [employer] of Occupational Health - Occupational Stress Questionnaire    Feeling of Stress : Not at all  Social Connections: Moderately Integrated (03/26/2023)   Social Connection and Isolation Panel [NHANES]    Frequency of Communication with Friends and Family: Three times a week    Frequency of Social Gatherings with Friends and Family: Three times a week    Attends Religious Services: More than 4 times per year    Active Member of Clubs or Organizations: Yes    Attends Banker Meetings: More than 4 times per year    Marital Status: Widowed  Intimate Partner Violence: Not At Risk (03/01/2023)   Humiliation, Afraid, Rape, and Kick questionnaire    Fear of Current or Ex-Partner: No    Emotionally Abused: No    Physically Abused: No    Sexually Abused: No     Outpatient Medications Prior to Visit  Medication Sig Dispense Refill   acetaminophen (TYLENOL) 325 MG tablet Take 650 mg by mouth every 6 (six) hours as needed.     blood glucose meter kit and supplies KIT Dispense based on patient and insurance preference. Use to check blood sugars twice daily. DX E11.9 1 each 0   celecoxib (CELEBREX) 200 MG capsule Take 1 capsule (200 mg total) by mouth 2 (two) times daily. 90 capsule 0   cholecalciferol (VITAMIN D) 1000 units tablet Take 1,000 Units by mouth daily.     Dulaglutide (TRULICITY) 4.5 MG/0.5ML SOPN Inject 4.5 mg as directed once a week. 8 mL 2   ferrous sulfate 325 (65 FE) MG EC tablet Take 325 mg by mouth daily.     FLUoxetine (PROZAC) 20 MG capsule TAKE 1 CAPSULE BY MOUTH DAILY 90 capsule 3   fluticasone (FLONASE) 50 MCG/ACT nasal spray USE 2 SPRAYS IN BOTH NOSTRILS  DAILY 64 g 0   glucose blood test strip Use as instructed to check blood sugars to check blood sugars twice daily. Dx E11.9 100 each 12   levocetirizine (XYZAL) 5 MG tablet TAKE 1 TABLET BY MOUTH DAILY 80 tablet 0   levothyroxine (SYNTHROID) 88 MCG  tablet TAKE 1 TABLET BY MOUTH DAILY 100 tablet 2   metFORMIN (GLUCOPHAGE-XR) 500 MG 24 hr tablet TAKE 1 TABLET BY MOUTH TWICE  DAILY 200 tablet 2   pantoprazole (PROTONIX) 40 MG tablet TAKE 1 TABLET BY MOUTH DAILY 100 tablet 2   rosuvastatin (CRESTOR) 40 MG tablet Take 1  tablet (40 mg total) by mouth daily. 100 tablet 1   traZODone (DESYREL) 50 MG tablet TAKE 1 TABLET BY MOUTH DAILY 90 tablet 3   vitamin B-12 (CYANOCOBALAMIN) 1000 MCG tablet Take 1,000 mcg by mouth daily.     No facility-administered medications prior to visit.    Allergies  Allergen Reactions   Codeine Shortness Of Breath    stops breathing   Meperidine And Related Shortness Of Breath    Stops breathing   Oxycontin [Oxycodone Hcl] Shortness Of Breath    Stops breathing   Ezetimibe Other (See Comments)    Doesn't remember reaction Other reaction(s): Muscle Pain   Silicone Other (See Comments)   Jardiance [Empagliflozin] Other (See Comments)    Yeast infections   Tape Rash    Paper tape ok    ROS Review of Systems    Objective:    Physical Exam  BP 116/74   Pulse 75   Temp 98 F (36.7 C)   Ht 5\' 1"  (1.549 m)   Wt 130 lb 12.8 oz (59.3 kg)   SpO2 97%   BMI 24.71 kg/m  Wt Readings from Last 3 Encounters:  03/27/23 130 lb 12.8 oz (59.3 kg)  03/15/23 135 lb 12.8 oz (61.6 kg)  03/01/23 131 lb (59.4 kg)     Health Maintenance  Topic Date Due   COVID-19 Vaccine (4 - 2023-24 season) 07/07/2022   Lung Cancer Screening  08/22/2022   Zoster Vaccines- Shingrix (1 of 2) 05/31/2023 (Originally 10/08/1965)   INFLUENZA VACCINE  06/07/2023   HEMOGLOBIN A1C  09/15/2023   Diabetic kidney evaluation - Urine ACR  12/08/2023   OPHTHALMOLOGY EXAM  01/09/2024   Medicare Annual Wellness (AWV)  02/29/2024   Diabetic kidney evaluation - eGFR measurement  03/14/2024   FOOT EXAM  03/14/2024   Pneumonia Vaccine 85+ Years old  Completed   DEXA SCAN  Completed   Hepatitis C Screening  Completed   HPV VACCINES  Aged  Out   DTaP/Tdap/Td  Discontinued   Fecal DNA (Cologuard)  Discontinued    There are no preventive care reminders to display for this patient.  Lab Results  Component Value Date   TSH 2.90 11/13/2022   Lab Results  Component Value Date   WBC 10.1 03/15/2023   HGB 13.3 03/15/2023   HCT 40.0 03/15/2023   MCV 84.6 03/15/2023   PLT 320.0 03/15/2023   Lab Results  Component Value Date   NA 133 (L) 03/15/2023   K 4.7 03/15/2023   CO2 23 03/15/2023   GLUCOSE 115 (H) 03/15/2023   BUN 16 03/15/2023   CREATININE 0.80 03/15/2023   BILITOT 0.3 03/15/2023   ALKPHOS 73 03/15/2023   AST 14 03/15/2023   ALT 9 03/15/2023   PROT 7.0 03/15/2023   ALBUMIN 4.2 03/15/2023   CALCIUM 9.1 03/15/2023   ANIONGAP 6 08/11/2022   GFR 71.06 03/15/2023   Lab Results  Component Value Date   CHOL 108 03/15/2023   Lab Results  Component Value Date   HDL 47.10 03/15/2023   Lab Results  Component Value Date   LDLCALC 41 03/15/2023   Lab Results  Component Value Date   TRIG 97.0 03/15/2023   Lab Results  Component Value Date   CHOLHDL 2 03/15/2023   Lab Results  Component Value Date   HGBA1C 8.3 (H) 03/15/2023      Assessment & Plan:  There are no diagnoses linked to this encounter.  Follow-up: No follow-ups on file.  Kara Dies, NP

## 2023-03-28 ENCOUNTER — Encounter: Payer: Self-pay | Admitting: Nurse Practitioner

## 2023-03-28 DIAGNOSIS — J40 Bronchitis, not specified as acute or chronic: Secondary | ICD-10-CM | POA: Insufficient documentation

## 2023-03-28 NOTE — Assessment & Plan Note (Signed)
Physical exam reassuring.  No adventitious sounds to auscultation. Will treat with Augmentin, prednisone and benzonatate Perles. Advised patient to increase hydration. Advised patient to call the office if symptoms are not improving.

## 2023-03-30 ENCOUNTER — Other Ambulatory Visit: Payer: Medicare Other

## 2023-03-30 ENCOUNTER — Other Ambulatory Visit: Payer: Self-pay | Admitting: Internal Medicine

## 2023-03-30 DIAGNOSIS — J302 Other seasonal allergic rhinitis: Secondary | ICD-10-CM

## 2023-03-30 MED ORDER — LEVOCETIRIZINE DIHYDROCHLORIDE 5 MG PO TABS
5.0000 mg | ORAL_TABLET | Freq: Every day | ORAL | 0 refills | Status: DC
Start: 2023-03-30 — End: 2023-07-21

## 2023-03-30 MED ORDER — FLUTICASONE PROPIONATE 50 MCG/ACT NA SUSP: NASAL | 0 refills | Status: DC

## 2023-04-03 ENCOUNTER — Encounter (INDEPENDENT_AMBULATORY_CARE_PROVIDER_SITE_OTHER): Payer: Self-pay | Admitting: Vascular Surgery

## 2023-04-03 ENCOUNTER — Encounter (INDEPENDENT_AMBULATORY_CARE_PROVIDER_SITE_OTHER): Payer: Self-pay

## 2023-04-05 ENCOUNTER — Telehealth: Payer: Self-pay

## 2023-04-05 NOTE — Telephone Encounter (Signed)
Noted  

## 2023-04-05 NOTE — Telephone Encounter (Signed)
PA denied. Cough meds not covered by medicare

## 2023-04-05 NOTE — Telephone Encounter (Signed)
PA initiated via Covermymeds; KEY: [id number]. Awaiting determination.

## 2023-04-11 ENCOUNTER — Other Ambulatory Visit: Payer: Self-pay | Admitting: Family

## 2023-04-11 DIAGNOSIS — Z760 Encounter for issue of repeat prescription: Secondary | ICD-10-CM

## 2023-04-17 ENCOUNTER — Other Ambulatory Visit: Payer: Medicare Other | Admitting: Pharmacist

## 2023-04-17 DIAGNOSIS — E1165 Type 2 diabetes mellitus with hyperglycemia: Secondary | ICD-10-CM

## 2023-04-17 MED ORDER — TRULICITY 4.5 MG/0.5ML ~~LOC~~ SOAJ: 4.5000 mg | SUBCUTANEOUS | 2 refills | Status: DC

## 2023-04-17 MED ORDER — METFORMIN HCL ER 500 MG PO TB24
ORAL_TABLET | ORAL | 2 refills | Status: DC
Start: 2023-04-17 — End: 2023-05-15

## 2023-04-17 NOTE — Patient Instructions (Signed)
Jody Wang,   It was great talking to you today!  Increase metformin by adding a third tablet, taken after supper, so you are taking 2 tablets after your first meal and 1 tablet after your second meal. Continue Trulicity 4.5 mg weekly.   Check your blood sugars daily, alternating between:  1) Fasting, first thing in the morning before breakfast and  2) 2 hours after your largest meal.   For a goal A1c of less than 7%, goal fasting readings are less than 130 and goal 2 hour after meal readings are less than 180.    Take care!  [name], PharmD, BCACP, CPP [facility] [phone]

## 2023-04-17 NOTE — Progress Notes (Signed)
04/17/2023 Name: Jody Wang MRN: [id number] DOB: Jul 22, 1946  Chief Complaint  Patient presents with   Medication Management   Diabetes   Hypertension   Hyperlipidemia    Jody Wang is a 77 y.o. year old adult who presented for a telephone visit.   They were referred to the pharmacist by their PCP for assistance in managing diabetes.   Patient is participating in a Managed Medicaid Plan:  Yes  Subjective:  Care Team: Primary Care Provider: Dale Roebuck, MD ; Next Scheduled Visit: 06/2023  Medication Access/Adherence  Current Pharmacy:  [facility] - [place], [place] - [address] [place] [address] Phone: [phone] Fax: [phone]  [facility] - [place], [place] - [address] [place] [place] [address] Phone: [phone] Fax: [phone]  [facility] [place], [place] - [address] [facility] [address] [place] [address] Phone: [phone] Fax: [phone]   Patient reports affordability concerns with their medications: No  Patient reports access/transportation concerns to their pharmacy: No  Patient reports adherence concerns with their medications:  No     Diabetes:  Current medications: metformin XR 1000 mg once daily, Trulicity 4.5 mg weekly (though was still taking 3 mg dose at the time of the last A1c)  Current glucose readings: fasting: 180-190s  Patient denies hypoglycemic s/sx including dizziness, shakiness, sweating. Patient denies hyperglycemic symptoms including polyuria, polydipsia, polyphagia, nocturia, neuropathy, blurred vision.  Current meal patterns:  - Breakfast: wakes up late - Lunch/Supper: BBQ, corn; sometimes will go out and get [place] (fajitas w/o rice, beans), [employer];  - Snacks: fruits - post supper snack; grapes, water melon, peaches, bananas, strawberries, did make home made ice cream a few  weeks ago;  - Drinks: Coke zero  Current physical activity: walks Fifi (dog), but doesn't walk consistently; knee replacement surgery a few weeks ago  Current medication access support: Trulicity patient assistance  Hyperlipidemia/ASCVD Risk Reduction  Current lipid lowering medications: rosuvastatin 40 mg daily   Objective:  Lab Results  Component Value Date   HGBA1C 8.3 (H) 03/15/2023    Lab Results  Component Value Date   CREATININE 0.80 03/15/2023   BUN 16 03/15/2023   NA 133 (L) 03/15/2023   K 4.7 03/15/2023   CL 100 03/15/2023   CO2 23 03/15/2023    Lab Results  Component Value Date   CHOL 108 03/15/2023   HDL 47.10 03/15/2023   LDLCALC 41 03/15/2023   LDLDIRECT 188.8 06/30/2013   TRIG 97.0 03/15/2023   CHOLHDL 2 03/15/2023    Medications Reviewed Today     Reviewed by Alden Hipp, RPH-CPP (Pharmacist) on 04/17/23 at 1345  Med List Status: <None>   Medication Order Taking? Sig Documenting Provider Last Dose Status Informant  acetaminophen (TYLENOL) 325 MG tablet [id number] Yes Take 650 mg by mouth every 6 (six) hours as needed. [provider] Taking Active   blood glucose meter kit and supplies KIT [id number]  Dispense based on patient and insurance preference. Use to check blood sugars twice daily. DX E11.9 Dale [place], MD  Active   celecoxib (CELEBREX) 200 MG capsule [id number] Yes Take 1 capsule (200 mg total) by mouth 2 (two) times daily. Madelyn Flavors, PA-C Taking Active   cholecalciferol (VITAMIN D) 1000 units tablet [id number] Yes Take 1,000 Units by mouth daily. [provider] Taking Active Self  Dulaglutide (TRULICITY)  4.5 MG/0.5ML SOPN [id number] Yes Inject 4.5 mg as directed once a week. Dale [place], MD Taking Active   ferrous sulfate 325 (65 FE) MG EC tablet [id number] Yes Take 325 mg by mouth daily. [provider] Taking Active   FLUoxetine (PROZAC) 20 MG capsule [id number] Yes TAKE 1 CAPSULE BY MOUTH  DAILY Dale [place], MD Taking Active   fluticasone (FLONASE) 50 MCG/ACT nasal spray [id number] Yes Use 2 sprays in both nostrils daily Dale [place]rum, MD Taking Active   glucose blood test strip [id number]  Use as instructed to check blood sugars to check blood sugars twice daily. Dx E11.9 Dale [place], MD  Active   levocetirizine (XYZAL) 5 MG tablet [id number] Yes Take 1 tablet (5 mg total) by mouth daily. Dale [place], MD Taking Active   levothyroxine (SYNTHROID) 88 MCG tablet [id number] Yes TAKE 1 TABLET BY MOUTH DAILY Dale [place]ms, MD Taking Active   metFORMIN (GLUCOPHAGE-XR) 500 MG 24 hr tablet [id number] Yes TAKE 1 TABLET BY MOUTH TWICE  DAILY Dale [place], MD Taking Active   pantoprazole (PROTONIX) 40 MG tablet [id number] Yes TAKE 1 TABLET BY MOUTH DAILY Dale [place], MD Taking Active   rosuvastatin (CRESTOR) 40 MG tablet [id number] Yes Take 1 tablet (40 mg total) by mouth daily. Dale [place], MD Taking Active   traZODone (DESYREL) 50 MG tablet [id number] Yes TAKE 1 TABLET BY MOUTH DAILY Dale [place], MD Taking Active   vitamin B-12 (CYANOCOBALAMIN) 1000 MCG tablet [id number] Yes Take 1,000 mcg by mouth daily. [provider] Taking Active Self              Assessment/Plan:   Diabetes: - Currently uncontrolled - Reviewed long term cardiovascular and renal outcomes of uncontrolled blood sugar - Reviewed goal A1c, goal fasting, and goal 2 hour post prandial glucose - Reviewed dietary modifications including: focus on lean proteins, fruits and vegetables, whole grains - Reviewed lifestyle modifications including: set goal of increased physical activity by walking her dogs after supper when it starts to cool down  - Recommend to increase metformin to 1000 mg QAM, 500 mg QPM. Patient will do so. Continue Trulicity 4.5 mg weekly - Recommend to check glucose daily, alternating between fasting and 2 hour post prandial   Hyperlipidemia/ASCVD Risk Reduction: -  Currently controlled.  - Recommend to continue current regimen  Follow Up Plan: phone call in 6 weeks  [name], PharmD, BCACP, CPP [facility] [phone]

## 2023-04-18 MED ORDER — TRULICITY 4.5 MG/0.5ML ~~LOC~~ SOAJ: 4.5000 mg | SUBCUTANEOUS | 2 refills | Status: DC

## 2023-04-18 NOTE — Addendum Note (Signed)
Addended by: Nilda Simmer T on: 04/18/2023 11:10 AM   Modules accepted: Orders

## 2023-04-19 ENCOUNTER — Telehealth: Payer: Self-pay | Admitting: Internal Medicine

## 2023-04-19 NOTE — Telephone Encounter (Signed)
Prescription Request  04/19/2023  LOV: 03/15/2023  What is the name of the medication or equipment? fluticasone   Have you contacted your pharmacy to request a refill? Yes   Which pharmacy would you like this sent to?  [facility]  Patient notified that their request is being sent to the clinical staff for review and that they should receive a response within 2 business days.   Please advise at Mobile [phone] (mobile)

## 2023-04-20 ENCOUNTER — Other Ambulatory Visit: Payer: Self-pay

## 2023-04-20 MED ORDER — FLUTICASONE PROPIONATE 50 MCG/ACT NA SUSP: NASAL | 3 refills | Status: DC

## 2023-04-20 NOTE — Telephone Encounter (Signed)
Rx sent in

## 2023-04-25 DIAGNOSIS — H90A32 Mixed conductive and sensorineural hearing loss, unilateral, left ear with restricted hearing on the contralateral side: Secondary | ICD-10-CM | POA: Diagnosis not present

## 2023-04-25 DIAGNOSIS — H903 Sensorineural hearing loss, bilateral: Secondary | ICD-10-CM | POA: Diagnosis not present

## 2023-04-25 DIAGNOSIS — H6123 Impacted cerumen, bilateral: Secondary | ICD-10-CM | POA: Diagnosis not present

## 2023-05-12 ENCOUNTER — Other Ambulatory Visit: Payer: Self-pay | Admitting: Internal Medicine

## 2023-05-12 DIAGNOSIS — E1165 Type 2 diabetes mellitus with hyperglycemia: Secondary | ICD-10-CM

## 2023-05-23 ENCOUNTER — Encounter: Payer: Self-pay | Admitting: Internal Medicine

## 2023-06-04 ENCOUNTER — Other Ambulatory Visit: Payer: Medicare Other | Admitting: Pharmacist

## 2023-06-04 DIAGNOSIS — E1165 Type 2 diabetes mellitus with hyperglycemia: Secondary | ICD-10-CM

## 2023-06-04 MED ORDER — METFORMIN HCL ER 500 MG PO TB24
1500.0000 mg | ORAL_TABLET | Freq: Every day | ORAL | Status: AC
Start: 2023-06-04 — End: ?

## 2023-06-04 NOTE — Patient Instructions (Signed)
Jody Wang,   It was great talking to you today!  Focus on taking a total of 3 metformin tablets a day. Continue Trulicity 4.5 mg weekly.   Try a protein snack before bed, instead of a protein and a carb. Let's see if this helps lower your fasting readings.   Check your blood sugars twice daily:  1) Fasting, first thing when you wake up, and  2) 2 hours after your largest meal.   For a goal A1c of less than 7%, goal fasting readings are less than 130 and goal 2 hour after meal readings are less than 180.

## 2023-06-04 NOTE — Progress Notes (Signed)
06/04/2023 Name: Jody Wang MRN: [id number] DOB: [birth year]  Chief Complaint  Patient presents with   Medication Management   Diabetes    CAEDANCE ZUPAN is a 77 y.o. year old adult who presented for a telephone visit.   They were referred to the pharmacist by their PCP for assistance in managing diabetes.    Subjective:  Care Team: Primary Care Provider: Dale Leona, MD ; Next Scheduled Visit: 07/16/23  Medication Access/Adherence  Current Pharmacy:  [facility] - [place], [place] - [address] [place] [address] Phone: [phone] Fax: [phone]  [facility] - [place], [place] - [address] [place] [address] Phone: [phone] Fax: [phone]  [facility] - [place], [place] - [address] [place] [address] Phone: [phone] Fax: [phone]   Patient reports affordability concerns with their medications: No  Patient reports access/transportation concerns to their pharmacy: No  Patient reports adherence concerns with their medications:  No    Diabetes:  Current medications: metformin XR 1000 mg daily - did not work in a third tablet consistently due to her dietary habits; Trulicity 4.5 mg weekly Prior medications: Jardiance (UTI)  Current glucose readings: fasting: 170-200s; pre supper: 120-150s  Patient denies hypoglycemic s/sx including dizziness, shakiness, sweating. Patient denies hyperglycemic symptoms including polyuria, polydipsia, polyphagia, nocturia, neuropathy, blurred vision.  Current meal patterns:  - Breakfast (11 am -12 pm): sometimes a small snack, sometimes not. Tends to forget a third metformin tablet if she doesn't eat anything for breakfast - Snacks: pre bed snack: was previously told she needs to eat a snack before bed consisting of a protein and a carb before bed to prevent hypoglycemia  overnight; notes she does wake up around 3 am with low blood sugar symptoms if she doesn't . Notes she typically makes 1 piece of french toast with sugar free syrup  Current physical activity: walks her dog some, but notes it is hard with the heat. Plans to start walking more in the fall with her sister in their neighborhood.  Current medication access support: Trulicity patient assistance   Hyperlipidemia/ASCVD Risk Reduction  Current lipid lowering medications: rosuvastatin 40 mg daily  Objective:  Lab Results  Component Value Date   HGBA1C 8.3 (H) 03/15/2023    Lab Results  Component Value Date   CREATININE 0.80 03/15/2023   BUN 16 03/15/2023   NA 133 (L) 03/15/2023   K 4.7 03/15/2023   CL 100 03/15/2023   CO2 23 03/15/2023    Lab Results  Component Value Date   CHOL 108 03/15/2023   HDL 47.10 03/15/2023   LDLCALC 41 03/15/2023   LDLDIRECT 188.8 06/30/2013   TRIG 97.0 03/15/2023   CHOLHDL 2 03/15/2023    Medications Reviewed Today     Reviewed by Alden Hipp, RPH-CPP (Pharmacist) on 06/04/23 at 1442  Med List Status: <None>   Medication Order Taking? Sig Documenting Provider Last Dose Status Informant  acetaminophen (TYLENOL) 325 MG tablet [id number]  Take 650 mg by mouth every 6 (six) hours as needed. [provider]  Active   blood glucose meter kit and supplies KIT [id number]  Dispense based on patient and insurance preference. Use to check blood sugars twice daily. DX E11.9 Dale Bastrop, MD  Active   celecoxib (CELEBREX) 200 MG capsule [id number]  Take 1 capsule (200 mg total) by mouth  2 (two) times daily. Madelyn Flavors, PA-C  Active   cholecalciferol (VITAMIN D) 1000 units tablet [id number] Yes Take 1,000 Units by mouth daily. [provider] Taking Active Self  Dulaglutide (TRULICITY) 4.5 MG/0.5ML SOPN [id number] Yes Inject 4.5 mg as directed once a week. Dale Bucoda, MD Taking Active   ferrous sulfate 325 (65 FE) MG EC tablet  [id number] Yes Take 325 mg by mouth daily. [provider] Taking Active   FLUoxetine (PROZAC) 20 MG capsule [id number]  TAKE 1 CAPSULE BY MOUTH DAILY Dale Kaktovik, MD  Active   fluticasone (FLONASE) 50 MCG/ACT nasal spray [id number]  Use 2 sprays in both nostrils daily Dale Trousdale, MD  Active   glucose blood test strip [id number]  Use as instructed to check blood sugars to check blood sugars twice daily. Dx E11.9 Dale Bennett, MD  Active   levocetirizine (XYZAL) 5 MG tablet [id number] Yes Take 1 tablet (5 mg total) by mouth daily. Dale Dubberly, MD Taking Active   levothyroxine (SYNTHROID) 88 MCG tablet [id number] Yes TAKE 1 TABLET BY MOUTH DAILY Dale Lakemore, MD Taking Active   metFORMIN (GLUCOPHAGE-XR) 500 MG 24 hr tablet [id number] Yes TAKE 1 TABLET BY MOUTH TWICE  DAILY Dale Jayton, MD Taking Active   pantoprazole (PROTONIX) 40 MG tablet [id number] Yes TAKE 1 TABLET BY MOUTH DAILY Dale Ciales, MD Taking Active   rosuvastatin (CRESTOR) 40 MG tablet [id number] Yes Take 1 tablet (40 mg total) by mouth daily. Dale Fairview, MD Taking Active   traZODone (DESYREL) 50 MG tablet [id number] Yes TAKE 1 TABLET BY MOUTH DAILY Dale Farmington, MD Taking Active   vitamin B-12 (CYANOCOBALAMIN) 1000 MCG tablet [id number] Yes Take 1,000 mcg by mouth daily. [provider] Taking Active Self              Assessment/Plan:   Diabetes: - Currently uncontrolled per most recently blood sugar readings.  - Reviewed long term cardiovascular and renal outcomes of uncontrolled blood sugar - Reviewed goal A1c, goal fasting, and goal 2 hour post prandial glucose - Reviewed dietary modifications including: discussed changing pre-bedtime snack to protein to see if improvement in AM fastings. Discussed options to work in a third metformin tablet, including either before  - Reviewed lifestyle modifications including: increasing physical activity as able.  - Recommend to continue  Trulicity 4.5 mg weekly and metformin XR 1500 mg daily. If next A1c is not at goal, recommend increasing metformin to a total of 2000 mg daily, as able based on patient meal patterns and appropriate timing of administration - Recommend to check glucose twice daily, fasting and 2 hours after a meal  Hyperlipidemia/ASCVD Risk Reduction: - Currently controlled.  - Recommend to continue current regimen   Follow Up Plan: PCP in ~ 6 weeks, PharmD in ~ 4 weeks after that  [name], PharmD, BCACP, CPP Clinical Pharmacist [facility] [phone]

## 2023-07-12 ENCOUNTER — Other Ambulatory Visit: Payer: Medicare HMO

## 2023-07-12 DIAGNOSIS — D649 Anemia, unspecified: Secondary | ICD-10-CM

## 2023-07-12 DIAGNOSIS — E7800 Pure hypercholesterolemia, unspecified: Secondary | ICD-10-CM | POA: Diagnosis not present

## 2023-07-12 DIAGNOSIS — E871 Hypo-osmolality and hyponatremia: Secondary | ICD-10-CM | POA: Diagnosis not present

## 2023-07-12 DIAGNOSIS — E1165 Type 2 diabetes mellitus with hyperglycemia: Secondary | ICD-10-CM

## 2023-07-12 LAB — BASIC METABOLIC PANEL
BUN: 11 mg/dL (ref 6–23)
CO2: 26 meq/L (ref 19–32)
Calcium: 9.1 mg/dL (ref 8.4–10.5)
Chloride: 103 meq/L (ref 96–112)
Creatinine, Ser: 0.74 mg/dL (ref 0.40–1.20)
GFR: 77.86 mL/min (ref >60.00)
Glucose, Bld: 145 mg/dL — ABNORMAL HIGH (ref 70–99)
Potassium: 4.6 meq/L (ref 3.5–5.1)
Sodium: 137 meq/L (ref 135–145)

## 2023-07-12 LAB — LIPID PANEL
Cholesterol: 107 mg/dL (ref 0–200)
HDL: 47.7 mg/dL
LDL Cholesterol: 39 mg/dL (ref 0–99)
NonHDL: 59.6
Total CHOL/HDL Ratio: 2
Triglycerides: 104 mg/dL (ref 0.0–149.0)
VLDL: 20.8 mg/dL (ref 0.0–40.0)

## 2023-07-12 LAB — HEPATIC FUNCTION PANEL
ALT: 12 U/L (ref 0–35)
AST: 14 U/L (ref 0–37)
Albumin: 4.1 g/dL (ref 3.5–5.2)
Alkaline Phosphatase: 72 U/L (ref 39–117)
Bilirubin, Direct: 0.1 mg/dL (ref 0.0–0.3)
Total Bilirubin: 0.3 mg/dL (ref 0.2–1.2)
Total Protein: 7.1 g/dL (ref 6.0–8.3)

## 2023-07-12 LAB — CBC WITH DIFFERENTIAL/PLATELET
Basophils Absolute: 0 10*3/uL (ref 0.0–0.1)
Basophils Relative: 0.2 % (ref 0.0–3.0)
Eosinophils Absolute: 0.3 10*3/uL (ref 0.0–0.7)
Eosinophils Relative: 2.9 % (ref 0.0–5.0)
HCT: 41.7 % (ref 36.0–46.0)
Hemoglobin: 13.5 g/dL (ref 12.0–15.0)
Lymphocytes Relative: 21.9 % (ref 12.0–46.0)
Lymphs Abs: 2.3 10*3/uL (ref 0.7–4.0)
MCHC: 32.5 g/dL (ref 30.0–36.0)
MCV: 88.8 fl (ref 78.0–100.0)
Monocytes Absolute: 0.7 10*3/uL (ref 0.1–1.0)
Monocytes Relative: 6.7 % (ref 3.0–12.0)
Neutro Abs: 7.2 10*3/uL (ref 1.4–7.7)
Neutrophils Relative %: 68.3 % (ref 43.0–77.0)
Platelets: 335 10*3/uL (ref 150.0–400.0)
RBC: 4.7 Mil/uL (ref 3.87–5.11)
RDW: 13.5 % (ref 11.5–15.5)
WBC: 10.5 10*3/uL (ref 4.0–10.5)

## 2023-07-12 LAB — FERRITIN: Ferritin: 40.4 ng/mL (ref 10.0–291.0)

## 2023-07-12 LAB — HEMOGLOBIN A1C: Hgb A1c MFr Bld: 8 % — ABNORMAL HIGH (ref 4.6–6.5)

## 2023-07-15 NOTE — Progress Notes (Signed)
L-s   Subjective:    Patient ID: Jody Wang, adult    DOB: 15-May-1946, 77 y.o.   MRN: [id number]  Patient here for  Chief Complaint  Patient presents with   Medical Management of Chronic Issues    HPI Here to follow up regarding diabetes, hypercholesterolemia and hypothyroidism. Continues on trulicity and metformin.  Blood sugars averaging 150-160. Tries to stay active.  No chest pain or sob reported. No increased cough or congestion. Discussed diet and exercise.  A1c 8.0.  decreased from last, but still elevated.  Tripped recently - left knee.  S/p knee surgery.  Walking.  Some pain hips and thighs.  Low back pain.  Notices with walking/vacuuming.     Past Medical History:  Diagnosis Date   AK (actinic keratosis)    Anemia    Aortic atherosclerosis (HCC)    Carotid artery disease (HCC)    Carotid bruit    Chest pain 01/28/2017   Complication of anesthesia    hard time waking up x 1 surgery   Depression    Diabetes mellitus without complication (HCC)    GERD (gastroesophageal reflux disease)    Heart murmur    Hypercholesterolemia    Hypothyroidism    Lung nodule, multiple    Osteoarthritis of knee    Shingles    Wears dentures    partial lower   Past Surgical History:  Procedure Laterality Date   ABDOMINAL HYSTERECTOMY  1985   ovaries not removed   BREAST CYST EXCISION Right    BREAST CYST EXCISION Left    CATARACT EXTRACTION W/PHACO Right 02/16/2016   Procedure: CATARACT EXTRACTION PHACO AND INTRAOCULAR LENS PLACEMENT (IOC) RIGHT ;  Surgeon: Lockie Mola, MD;  Location: [facility];  Service: Ophthalmology;  Laterality: Right;  DIABETIC - oral meds   CATARACT EXTRACTION W/PHACO Left 03/15/2016   Procedure: CATARACT EXTRACTION PHACO AND INTRAOCULAR LENS PLACEMENT (IOC)left eye;  Surgeon: Lockie Mola, MD;  Location: [facility];  Service: Ophthalmology;  Laterality: Left;  DIABETIC - oral meds   FOOT SURGERY Bilateral    INNER EAR  SURGERY Left    KNEE ARTHROPLASTY Right 03/17/2022   Procedure: COMPUTER ASSISTED TOTAL KNEE ARTHROPLASTY;  Surgeon: Donato Heinz, MD;  Location: [facility];  Service: Orthopedics;  Laterality: Right;   KNEE ARTHROPLASTY Left 08/23/2022   Procedure: COMPUTER ASSISTED TOTAL KNEE ARTHROPLASY;  Surgeon: Donato Heinz, MD;  Location: [facility];  Service: Orthopedics;  Laterality: Left;   Family History  Problem Relation Age of Onset   Diabetes Father    Coronary artery disease Father        s/p CABG   Hypertension Father    Hypercholesterolemia Father    Hypertension Brother    Diabetes Brother    Hypertension Sister    Glaucoma Sister    Diabetes Other        niece   Breast cancer Maternal Aunt    Breast cancer Paternal Aunt    Cervical cancer Paternal Aunt    Breast cancer Cousin    Breast cancer Other    Social History   Socioeconomic History   Marital status: Widowed    Spouse name: Not on file   Number of children: 3   Years of education: Not on file   Highest education level: Some college, no degree  Occupational History   Not on file  Tobacco Use   Smoking status: Former    Current packs/day: 0.00    Average  packs/day: 1 pack/day for 23.0 years (23.0 ttl pk-yrs)    Types: Cigarettes    Start date: 11/19/1960    Quit date: 11/20/1983    Years since quitting: 39.6   Smokeless tobacco: Never  Vaping Use   Vaping status: Never Used  Substance and Sexual Activity   Alcohol use: Yes    Comment: every monday   Drug use: No   Sexual activity: Never  Other Topics Concern   Not on file  Social History Narrative   Lives at home with son.    Social Determinants of Health   Financial Resource Strain: Low Risk  (03/26/2023)   Overall Financial Resource Strain (CARDIA)    Difficulty of Paying Living Expenses: Not hard at all  Food Insecurity: No Food Insecurity (03/26/2023)   Hunger Vital Sign    Worried About Running Out of Food in the Last Year: Never true    Ran  Out of Food in the Last Year: Never true  Transportation Needs: No Transportation Needs (03/26/2023)   PRAPARE - Administrator, Civil Service (Medical): No    Lack of Transportation (Non-Medical): No  Physical Activity: Inactive (03/26/2023)   Exercise Vital Sign    Days of Exercise per Week: 0 days    Minutes of Exercise per Session: 20 min  Stress: No Stress Concern Present (03/26/2023)   [employer] of Occupational Health - Occupational Stress Questionnaire    Feeling of Stress : Not at all  Social Connections: Moderately Integrated (03/26/2023)   Social Connection and Isolation Panel [NHANES]    Frequency of Communication with Friends and Family: Three times a week    Frequency of Social Gatherings with Friends and Family: Three times a week    Attends Religious Services: More than 4 times per year    Active Member of Clubs or Organizations: Yes    Attends Banker Meetings: More than 4 times per year    Marital Status: Widowed     Review of Systems  Constitutional:  Negative for appetite change and unexpected weight change.  HENT:  Negative for congestion and sinus pressure.   Respiratory:  Negative for cough, chest tightness and shortness of breath.   Cardiovascular:  Negative for chest pain and palpitations.  Gastrointestinal:  Negative for abdominal pain, diarrhea, nausea and vomiting.  Genitourinary:  Negative for difficulty urinating and dysuria.  Musculoskeletal:  Negative for joint swelling and myalgias.  Skin:  Negative for color change and rash.  Neurological:  Negative for dizziness and headaches.  Psychiatric/Behavioral:  Negative for agitation and dysphoric mood.        Objective:     BP 118/72   Pulse 72   Temp 98.2 F (36.8 C) (Oral)   Ht 5\' 1"  (1.549 m)   Wt 136 lb (61.7 kg)   SpO2 97%   BMI 25.70 kg/m  Wt Readings from Last 3 Encounters:  07/16/23 136 lb (61.7 kg)  03/27/23 130 lb 12.8 oz (59.3 kg)  03/15/23 135 lb  12.8 oz (61.6 kg)    Physical Exam Vitals reviewed.  Constitutional:      General: He is not in acute distress.    Appearance: Normal appearance. He is well-developed.  HENT:     Head: Normocephalic and atraumatic.     Right Ear: External ear normal.     Left Ear: External ear normal.  Eyes:     General: No scleral icterus.       Right eye:  No discharge.        Left eye: No discharge.     Conjunctiva/sclera: Conjunctivae normal.  Cardiovascular:     Rate and Rhythm: Normal rate and regular rhythm.  Pulmonary:     Effort: Pulmonary effort is normal. No respiratory distress.     Breath sounds: Normal breath sounds.  Abdominal:     General: Bowel sounds are normal.     Palpations: Abdomen is soft.     Tenderness: There is no abdominal tenderness.  Musculoskeletal:        General: No swelling or tenderness.     Cervical back: Neck supple. No tenderness.  Lymphadenopathy:     Cervical: No cervical adenopathy.  Skin:    Findings: No erythema or rash.  Neurological:     Mental Status: He is alert.  Psychiatric:        Mood and Affect: Mood normal.        Behavior: Behavior normal.      Outpatient Encounter Medications as of 07/16/2023  Medication Sig   acetaminophen (TYLENOL) 325 MG tablet Take 650 mg by mouth every 6 (six) hours as needed.   celecoxib (CELEBREX) 200 MG capsule Take 1 capsule (200 mg total) by mouth 2 (two) times daily.   cholecalciferol (VITAMIN D) 1000 units tablet Take 1,000 Units by mouth daily.   Dulaglutide (TRULICITY) 4.5 MG/0.5ML SOPN Inject 4.5 mg as directed once a week.   ferrous sulfate 325 (65 FE) MG EC tablet Take 325 mg by mouth daily.   FLUoxetine (PROZAC) 20 MG capsule TAKE 1 CAPSULE BY MOUTH DAILY   fluticasone (FLONASE) 50 MCG/ACT nasal spray Use 2 sprays in both nostrils daily   glucose blood test strip Use as instructed to check blood sugars to check blood sugars twice daily. Dx E11.9   levocetirizine (XYZAL) 5 MG tablet Take 1 tablet  (5 mg total) by mouth daily.   levothyroxine (SYNTHROID) 88 MCG tablet TAKE 1 TABLET BY MOUTH DAILY   metFORMIN (GLUCOPHAGE-XR) 500 MG 24 hr tablet Take 3 tablets (1,500 mg total) by mouth daily.   pantoprazole (PROTONIX) 40 MG tablet TAKE 1 TABLET BY MOUTH DAILY   rosuvastatin (CRESTOR) 40 MG tablet Take 1 tablet (40 mg total) by mouth daily.   vitamin B-12 (CYANOCOBALAMIN) 1000 MCG tablet Take 1,000 mcg by mouth daily.   [DISCONTINUED] blood glucose meter kit and supplies KIT Dispense based on patient and insurance preference. Use to check blood sugars twice daily. DX E11.9   [DISCONTINUED] traZODone (DESYREL) 50 MG tablet TAKE 1 TABLET BY MOUTH DAILY   No facility-administered encounter medications on file as of 07/16/2023.     Lab Results  Component Value Date   WBC 10.5 07/12/2023   HGB 13.5 07/12/2023   HCT 41.7 07/12/2023   PLT 335.0 07/12/2023   GLUCOSE 145 (H) 07/12/2023   CHOL 107 07/12/2023   TRIG 104.0 07/12/2023   HDL 47.70 07/12/2023   LDLDIRECT 188.8 06/30/2013   LDLCALC 39 07/12/2023   ALT 12 07/12/2023   AST 14 07/12/2023   NA 137 07/12/2023   K 4.6 07/12/2023   CL 103 07/12/2023   CREATININE 0.74 07/12/2023   BUN 11 07/12/2023   CO2 26 07/12/2023   TSH 2.90 11/13/2022   HGBA1C 8.0 (H) 07/12/2023   MICROALBUR 1.1 12/07/2022    MM 3D SCREEN BREAST BILATERAL  Result Date: 12/28/2022 CLINICAL DATA:  Screening. EXAM: DIGITAL SCREENING BILATERAL MAMMOGRAM WITH TOMOSYNTHESIS AND CAD TECHNIQUE: Bilateral screening digital craniocaudal and  mediolateral oblique mammograms were obtained. Bilateral screening digital breast tomosynthesis was performed. The images were evaluated with computer-aided detection. COMPARISON:  Previous exam(s). ACR Breast Density Category b: There are scattered areas of fibroglandular density. FINDINGS: There are no findings suspicious for malignancy. IMPRESSION: No mammographic evidence of malignancy. A result letter of this screening mammogram  will be mailed directly to the patient. RECOMMENDATION: Screening mammogram in one year. (Code:SM-B-01Y) BI-RADS CATEGORY  1: Negative. Electronically Signed   By: Harmon Pier M.D.   On: 12/28/2022 17:39       Assessment & Plan:  Type 2 diabetes mellitus with hyperglycemia, without long-term current use of insulin (HCC) Assessment & Plan: Continues on trulicity and metformin.   Last a1c 8.0. Discussed low carb diet and exercise as tolerated. Brought in no sugar readings.  Check and record sugars and send in readings. Follow met b and A1c today.   Orders: -     Hemoglobin A1c; Future  Hypercholesterolemia Assessment & Plan: Continue crestor.  Low cholesterol diet and exercise.  Follow lipid panel and liver function tests.   Orders: -     Lipid panel; Future -     Basic metabolic panel; Future -     Hepatic function panel; Future  Midline low back pain without sciatica, unspecified chronicity Assessment & Plan: Low back pain as outlined.  Persistent.  Check L-S spine xray.  Further w/up pending results.   Orders: -     DG Lumbar Spine 2-3 Views; Future  Encounter for immunization -     Flu Vaccine Trivalent High Dose (Fluad)  Anemia, unspecified type Assessment & Plan: With IDA.  Have discussed GI evaluation.  cologuard 02/2019- negative.  Has declined any further GI w/up.    Aortic atherosclerosis (HCC) Assessment & Plan: Continue crestor.    Bilateral carotid artery disease, unspecified type (HCC) Assessment & Plan: Previous left carotid 60-79% and right 40-50%.  Continue crestor.  Needs f/u with [facility] to monitor. Discussed again with her today.  Continue risk factor modification.  Continue crestor. She declines further evaluation.    Coronary artery disease of native artery of native heart with stable angina pectoris [facility]) Assessment & Plan: Previously seen on chest CT.  Saw Dr Mariah Milling.  No chest pain.  Continue risk factor modification.  Continue crestor.     Gastroesophageal reflux disease, unspecified whether esophagitis present Assessment & Plan: Continue protonix.  No upper symptoms reported.    Major depressive disorder, single episode, mild (HCC) Assessment & Plan: Overall appears to be doing well and handling stress.  Continue prozac.  Follow.     Hypothyroidism, unspecified type Assessment & Plan: On thyroid replacement.  Follow tsh.       Dale Fortine, MD

## 2023-07-16 ENCOUNTER — Other Ambulatory Visit: Payer: Medicare HMO

## 2023-07-16 ENCOUNTER — Ambulatory Visit: Payer: Medicare HMO | Admitting: Internal Medicine

## 2023-07-16 ENCOUNTER — Ambulatory Visit (INDEPENDENT_AMBULATORY_CARE_PROVIDER_SITE_OTHER): Payer: Medicare HMO

## 2023-07-16 ENCOUNTER — Encounter: Payer: Self-pay | Admitting: Internal Medicine

## 2023-07-16 VITALS — BP 118/72 | HR 72 | Temp 98.2°F | Ht 61.0 in | Wt 136.0 lb

## 2023-07-16 DIAGNOSIS — M545 Low back pain, unspecified: Secondary | ICD-10-CM | POA: Insufficient documentation

## 2023-07-16 DIAGNOSIS — Z7985 Long-term (current) use of injectable non-insulin antidiabetic drugs: Secondary | ICD-10-CM

## 2023-07-16 DIAGNOSIS — D649 Anemia, unspecified: Secondary | ICD-10-CM | POA: Diagnosis not present

## 2023-07-16 DIAGNOSIS — M419 Scoliosis, unspecified: Secondary | ICD-10-CM | POA: Diagnosis not present

## 2023-07-16 DIAGNOSIS — Z23 Encounter for immunization: Secondary | ICD-10-CM | POA: Diagnosis not present

## 2023-07-16 DIAGNOSIS — F320 Major depressive disorder, single episode, mild: Secondary | ICD-10-CM

## 2023-07-16 DIAGNOSIS — E7800 Pure hypercholesterolemia, unspecified: Secondary | ICD-10-CM | POA: Diagnosis not present

## 2023-07-16 DIAGNOSIS — I25118 Atherosclerotic heart disease of native coronary artery with other forms of angina pectoris: Secondary | ICD-10-CM

## 2023-07-16 DIAGNOSIS — I779 Disorder of arteries and arterioles, unspecified: Secondary | ICD-10-CM

## 2023-07-16 DIAGNOSIS — K219 Gastro-esophageal reflux disease without esophagitis: Secondary | ICD-10-CM | POA: Diagnosis not present

## 2023-07-16 DIAGNOSIS — E1165 Type 2 diabetes mellitus with hyperglycemia: Secondary | ICD-10-CM | POA: Diagnosis not present

## 2023-07-16 DIAGNOSIS — E039 Hypothyroidism, unspecified: Secondary | ICD-10-CM

## 2023-07-16 DIAGNOSIS — M47815 Spondylosis without myelopathy or radiculopathy, thoracolumbar region: Secondary | ICD-10-CM | POA: Diagnosis not present

## 2023-07-16 DIAGNOSIS — I700 Atherosclerosis of aorta: Secondary | ICD-10-CM

## 2023-07-16 DIAGNOSIS — M5126 Other intervertebral disc displacement, lumbar region: Secondary | ICD-10-CM | POA: Diagnosis not present

## 2023-07-17 ENCOUNTER — Other Ambulatory Visit: Payer: Self-pay

## 2023-07-17 ENCOUNTER — Telehealth: Payer: Self-pay | Admitting: Internal Medicine

## 2023-07-17 MED ORDER — TRAZODONE HCL 50 MG PO TABS: 50.0000 mg | ORAL_TABLET | Freq: Every day | ORAL | 1 refills | Status: DC

## 2023-07-17 NOTE — Telephone Encounter (Signed)
Patient just called and said she needs a refill on her medication she is completely out. The medication is traZODone (DESYREL) 50 MG tablet. The pharmacy she uses is [facility] on [address] [place], [place], [place] [address] [phone]. Her number is [phone]

## 2023-07-17 NOTE — Telephone Encounter (Signed)
Trazodone refilled.

## 2023-07-21 ENCOUNTER — Other Ambulatory Visit: Payer: Self-pay | Admitting: Internal Medicine

## 2023-07-21 ENCOUNTER — Encounter: Payer: Self-pay | Admitting: Internal Medicine

## 2023-07-21 DIAGNOSIS — E1165 Type 2 diabetes mellitus with hyperglycemia: Secondary | ICD-10-CM

## 2023-07-21 DIAGNOSIS — J302 Other seasonal allergic rhinitis: Secondary | ICD-10-CM

## 2023-07-21 NOTE — Assessment & Plan Note (Signed)
Previous left carotid 60-79% and right 40-50%.  Continue crestor.  Needs f/u with [facility] to monitor. Discussed again with her today.  Continue risk factor modification.  Continue crestor. She declines further evaluation.

## 2023-07-21 NOTE — Assessment & Plan Note (Signed)
Continue protonix.  No upper symptoms reported.

## 2023-07-21 NOTE — Assessment & Plan Note (Signed)
Overall appears to be doing well and handling stress.  Continue prozac.  Follow.

## 2023-07-21 NOTE — Assessment & Plan Note (Signed)
Previously seen on chest CT.  Saw Dr Mariah Milling.  No chest pain.  Continue risk factor modification.  Continue crestor.

## 2023-07-21 NOTE — Assessment & Plan Note (Signed)
Continue crestor.  Low cholesterol diet and exercise.  Follow lipid panel and liver function tests.

## 2023-07-21 NOTE — Assessment & Plan Note (Signed)
On thyroid replacement.  Follow tsh.  

## 2023-07-21 NOTE — Assessment & Plan Note (Signed)
Continues on trulicity and metformin.   Last a1c 8.0. Discussed low carb diet and exercise as tolerated. Brought in no sugar readings.  Check and record sugars and send in readings. Follow met b and A1c today.

## 2023-07-21 NOTE — Assessment & Plan Note (Signed)
Continue crestor 

## 2023-07-21 NOTE — Assessment & Plan Note (Signed)
With IDA.  Have discussed GI evaluation.  cologuard 02/2019- negative.  Has declined any further GI w/up.

## 2023-07-21 NOTE — Assessment & Plan Note (Signed)
Low back pain as outlined.  Persistent.  Check L-S spine xray.  Further w/up pending results.

## 2023-07-23 ENCOUNTER — Telehealth: Payer: Self-pay | Admitting: Internal Medicine

## 2023-07-23 NOTE — Telephone Encounter (Signed)
Called pt and let her know that her x-ray had not been read by radiology yet.  Let pt know that once Dr. Lorin Picket had received the results we would call her.  Pt verbalized understanding.

## 2023-07-23 NOTE — Telephone Encounter (Signed)
Patient just called and wanted to know her X-Ray results from last week. I didn't see anything in her chart. Her number is [phone]. She would like for someone to call her and let her know her results.

## 2023-07-24 MED ORDER — LEVOCETIRIZINE DIHYDROCHLORIDE 5 MG PO TABS: 5.0000 mg | ORAL_TABLET | Freq: Every day | ORAL | 0 refills | Status: DC

## 2023-07-27 NOTE — Telephone Encounter (Signed)
Please call and notify - xray reveals no acute change.  Arthritis changes present.  Recommend PT referral for evaluation and treatment.

## 2023-07-27 NOTE — Telephone Encounter (Signed)
It has resulted:   IMPRESSION:  1. No fracture or acute finding.  2. Degenerative changes as detailed in the upper lumbar spine.

## 2023-07-27 NOTE — Telephone Encounter (Signed)
Patient just called asking about her back X-Ray. She would like for someone to call her when her results are in.

## 2023-07-30 NOTE — Telephone Encounter (Signed)
With arthritis changes, PT (stretches/exercise) - feel this is the best way to treat.  If declines, can refer to ortho for further evaluation and treatment.

## 2023-07-30 NOTE — Telephone Encounter (Signed)
Pt declines PT.  She reported that it "costs me too much." Wanted to know if there is a medication she can take that will help.

## 2023-07-31 NOTE — Telephone Encounter (Signed)
Pt notified of recommendations and would like to think about it.  She will call back if she decides either way.

## 2023-08-02 ENCOUNTER — Other Ambulatory Visit: Payer: Self-pay

## 2023-08-02 ENCOUNTER — Telehealth: Payer: Self-pay

## 2023-08-02 DIAGNOSIS — E1165 Type 2 diabetes mellitus with hyperglycemia: Secondary | ICD-10-CM

## 2023-08-02 DIAGNOSIS — J302 Other seasonal allergic rhinitis: Secondary | ICD-10-CM

## 2023-08-02 NOTE — Telephone Encounter (Signed)
Prescription Request  08/02/2023  LOV: Visit date not found  What is the name of the medication or equipment? metFORMIN (GLUCOPHAGE-XR) 500 MG 24 hr tablet, levocetirizine (XYZAL) 5 MG tablet, celecoxib (CELEBREX) 200 MG capsule, FLUoxetine (PROZAC) 20 MG capsule, fluticasone (FLONASE) 50 MCG/ACT nasal spray  Have you contacted your pharmacy to request a refill? Yes   Which pharmacy would you like this sent to?  [facility] - 90-day supply (Mail Order)    Patient notified that their request is being sent to the clinical staff for review and that they should receive a response within 2 business days.   Please advise at Mobile [phone] (mobile)  Patient states she received a letter from [facility], they need more information to process the prescription request for the above medications.  Patient states [facility] has reached out to [place] but has not heard back from [place].

## 2023-08-03 MED ORDER — FLUOXETINE HCL 20 MG PO CAPS: 20.0000 mg | ORAL_CAPSULE | Freq: Every day | ORAL | 1 refills | Status: DC

## 2023-08-03 MED ORDER — METFORMIN HCL ER 500 MG PO TB24
1500.0000 mg | ORAL_TABLET | Freq: Every day | ORAL | 1 refills | Status: DC
Start: 2023-08-03 — End: 2023-11-15

## 2023-08-03 MED ORDER — FLUTICASONE PROPIONATE 50 MCG/ACT NA SUSP: NASAL | 3 refills | Status: DC

## 2023-08-03 MED ORDER — LEVOCETIRIZINE DIHYDROCHLORIDE 5 MG PO TABS
5.0000 mg | ORAL_TABLET | Freq: Every day | ORAL | 3 refills | Status: DC
Start: 2023-08-03 — End: 2024-03-19

## 2023-08-03 NOTE — Telephone Encounter (Signed)
I sent in rx for prozac, flonase, xyzal and metformin.  I did not send in rx for celebrex.  It appears this was prescribed by ortho.

## 2023-08-03 NOTE — Telephone Encounter (Signed)
Pended for approval to send to new mail order pharmacy

## 2023-08-20 ENCOUNTER — Other Ambulatory Visit: Payer: Medicare Other | Admitting: Pharmacist

## 2023-08-21 ENCOUNTER — Other Ambulatory Visit (INDEPENDENT_AMBULATORY_CARE_PROVIDER_SITE_OTHER): Payer: Medicare HMO

## 2023-08-21 ENCOUNTER — Other Ambulatory Visit: Payer: Medicare Other | Admitting: Pharmacist

## 2023-08-21 DIAGNOSIS — Z7984 Long term (current) use of oral hypoglycemic drugs: Secondary | ICD-10-CM

## 2023-08-21 DIAGNOSIS — E1165 Type 2 diabetes mellitus with hyperglycemia: Secondary | ICD-10-CM

## 2023-08-21 MED ORDER — ROSUVASTATIN CALCIUM 40 MG PO TABS: 40.0000 mg | ORAL_TABLET | Freq: Every day | ORAL | 3 refills | Status: DC

## 2023-08-21 NOTE — Patient Instructions (Addendum)
Mr. Jody Wang,   It was a pleasure to see you today! As we discussed:?   Continue Trulicity 4.5 mg dose under the skin once weekly We will start the process of renewing your application for next year (2025). You will get an email from [employer] once we submit your application. Please look for this email, as it will ask you to confirm/electronically sign.    The extended release version you have is designed to have less stomach side effects and therefore, the dose does not need to be split up between morning/evening (if easier to remember, you may take all tablets together once per day).  Continue metformin XR three 500 mg tablets (1500 mg total) once daily with dinner  Please reach out prior to your next scheduled appointment should you have any questions or concerns.  Thank you!   Future Appointments  Date Time Provider Department Center  10/08/2023  2:00 PM [facility] PHARMACIST [facility]  11/13/2023  3:00 PM [facility]  11/15/2023  3:00 PM Dale Lamont, MD [facility]  03/03/2024  1:30 PM [facility] ANNUAL WELLNESS VISIT [facility]    Berenice Primas, PharmD - Clinical Pharmacist

## 2023-08-21 NOTE — Progress Notes (Signed)
08/21/2023 Name: Jody Wang MRN: [id number] DOB: [birth year]  Subjective  Chief Complaint  Patient presents with   Diabetes    Reason for visit: Jody Wang is a 77 y.o. year old adult who presented for a telephone visit.   They were referred to the pharmacist by their PCP for assistance in managing diabetes.   Care Team: Primary Care Provider: Dale Silt, MD  Reason for visit: ?  [name] is a 77 y.o. adult with a history of diabetes (type 2), who presents today for a follow up diabetes pharmacotherapy visit.? Pertinent PMH also includes CAD/stable angina, hypothyroidism.   Known DM Complications: no known complications    Date of Last Diabetes Related Visit: 07/16/23 with PCP; 06/04/23 with Pharmacist  At Last Diabetes Related Visit: ?  7/29: Patient did not increase metformin as instructed, taking 1000 mg daily   Prescription drug coverage: Payor: [employer] MEDICARE / Plan: [facility] MEDICARE / Product Type: *No Product type* / .   Reports that all medications are affordable.   Current Patient Assistance: Yes Trulicity ([employer]) - Approved through 11/06/23   Medication Adherence: Patient denies missing doses of their medications.  *However, during medication review today, patient realized that she has not been taking rosuvastatin for the past couple of weeks (no refills)  Since Last visit / History of Present Illness: ?  Patient reports partially implementing plan from last visit. States that she takes anywhere between 2 and 4 metformin tablets (500 mg each) per day. She states this depends on what time she wakes, and when she eats. Sometimes takes in the morning, sometimes splits morning/evening.   Denies adverse effects with metformin or Trulicity at this time.   Reported DM Regimen: ?  metformin XR 1500 mg daily (Takes anywhere from 2-4 tablets in the morning) Trulicity 4.5 mg sq once weekly   DM medications tried in the past:?   Jardiance (08/2018)  x1 yeast infection treated, returned shortly thereafter, Jardiance stopped)  SMBG Per BG meter: ? Tries to check fasting BG and after dinner/before bed FBG range generally 126-130 mg/dL, though have been slightly high this week which she reports is due to having cookies at night. 10/13:179 10/14: 149 10/15: 137  Evening sugars usually in the 150s mg/dL.   Hypo/Hyperglycemia: ?  Symptoms of hypoglycemia since last visit:? no  If yes, it was treated by: n/a  Symptoms of hyperglycemia since last visit:? no - none  Exercise: Limited by fall s/p surgery. Increasing walking as able  DM Prevention:  Statin: Taking; high intensity History of chronic kidney disease? no History of albuminuria? no, last UACR on 12/07/22 = 0.9 mg/g ACE/ARB - Not taking; Urine MA/CR Ratio - normal.  Last eye exam: 01/09/2023; No retinopathy present Last foot exam: 03/15/2023 Tobacco Use: Former smoker ; 23py (quit 1985) Immunizations:? Flu: Up to date (Last: 07/16/2023); Pneumococcal: PPSV23 (07/02/23) PCV13 (10/2015 after age 68); Shingrix: No Record - DUE; Covid (12/2019, 01/2020, 08/2020; No Booster record)  Cardiovascular Risk Reduction History of clinical ASCVD? yes History of heart failure? no N/A History of hyperlipidemia? yes Current BMI: 25.7 kg/m2 (Ht 61 in, Wt 61.7 kg) Taking statin? yes; high intensity (rosuvastatin 40 mg daily) Taking aspirin? No    Taking SGLT-2i? no; Yeast infections on Jardiance Taking GLP- 1 RA? yes    _______________________________________________  Objective    Review of Systems:?  Constitutional:? No fever, chills or unintentional weight loss  Cardiovascular:? No chest pain or pressure, shortness of  breath, dyspnea on exertion, orthopnea or LE edema  Pulmonary:? No cough or shortness of breath  GI:? No nausea, vomiting, constipation, diarrhea, abdominal pain, dyspepsia, change in bowel habits  Endocrine:? No polyuria, polyphagia or blurred vision   Psych:? No depression, anxiety, insomnia   Physical Examination:  Vitals:  Wt Readings from Last 3 Encounters:  07/16/23 136 lb (61.7 kg)  03/27/23 130 lb 12.8 oz (59.3 kg)  03/15/23 135 lb 12.8 oz (61.6 kg)   BP Readings from Last 3 Encounters:  07/16/23 118/72  03/27/23 116/74  03/15/23 126/72   Pulse Readings from Last 3 Encounters:  07/16/23 72  03/27/23 75  03/15/23 70     Labs:?  Lab Results  Component Value Date   HGBA1C 8.0 (H) 07/12/2023   HGBA1C 8.3 (H) 03/15/2023   HGBA1C 8.3 (H) 11/13/2022   GLUCOSE 145 (H) 07/12/2023   MICRALBCREAT 0.9 12/07/2022   MICRALBCREAT 1.1 07/08/2021   MICRALBCREAT 3.1 03/08/2021   CREATININE 0.74 07/12/2023   CREATININE 0.80 03/15/2023   CREATININE 0.67 11/13/2022   GFR 77.86 07/12/2023   GFR 71.06 03/15/2023   GFR 84.50 11/13/2022    Lab Results  Component Value Date   CHOL 107 07/12/2023   LDLCALC 39 07/12/2023   LDLCALC 41 03/15/2023   LDLCALC 41 11/13/2022   LDLDIRECT 188.8 06/30/2013   HDL 47.70 07/12/2023   HDL 47.10 03/15/2023   HDL 47.50 11/13/2022   AST 14 07/12/2023   AST 14 03/15/2023   ALT 12 07/12/2023   ALT 9 03/15/2023      Chemistry      Component Value Date/Time   NA 137 07/12/2023 1343   K 4.6 07/12/2023 1343   CL 103 07/12/2023 1343   CO2 26 07/12/2023 1343   BUN 11 07/12/2023 1343   CREATININE 0.74 07/12/2023 1343   CREATININE 0.71 11/05/2014 1237      Component Value Date/Time   CALCIUM 9.1 07/12/2023 1343   ALKPHOS 72 07/12/2023 1343   AST 14 07/12/2023 1343   ALT 12 07/12/2023 1343   BILITOT 0.3 07/12/2023 1343       The ASCVD Risk score (Arnett DK, et al., 2019) failed to calculate for the following reasons:   The valid total cholesterol range is 130 to 320 mg/dL  Assessment and Plan:   1. Diabetes, type 2: uncontrolled per last A1c of 8.0% (07/12/23), relatively unchanged from previous 8.3% (03/15/23) with goal <7% without hypoglycemia. SMBG data shows BG are WNL fasting and  before bed most days. Continues to have inconsistent adherence with metformin. Seems to stem from her having inconsistent meals in the morning/evening. She is taking XR formulation. We discussed taking all 3 tablets together around the same meal each day is okay. Expect continued glycemic improvement with more consistent dosing.  Current Regimen: Metformin XR 1500 mg daily (taking [phone] mg daily), Trulicity 4.5 mg weekly Trulicity MAP renewal for 2025 started Continue medications today without changes.  Start taking all XR Metformin tablets together once daily to improve adherence SMBG: FBG, 2hPP  Reviewed BG goals in detail today (eg fasting vs 2hPP as patient reports "as long as it's under 200, you're okay" per her brother's provider many years ago) Discussed long-term risks of hyperglycemia (micro/macrovascular complications) Phone check in 4-6 weeks to assess metformin adherence/MAP re-enrollement Future Consideration: Titrate metformin XR to goal dose, 2000 mg daily GLP1-RA: Could transition to GLP1 with stronger A1c-lowering propensity as needed (Ozempic through [employer] MAP) SGLT2i: Avoiding at this time  per yeast infections on Jardiance TZD: Avoiding due to possible weight gain/increase in fracture risk. Insulin: Not unreasonable, though defer as last resort or as needed for severe hyperglycemia given risk hypoglycemia. Other safer options at this time.   2. ASCVD risk reduction: LDL at goal on last lipid panel with LDL 39 mg/dL, TG 914 mh/dL (05/13/28). LDL goal <55 mg/dL (secondary prevention). Notably, patient does not have rosuvastatin with her home medications. Thinks she has been out for at least 2 weeks.  Key risk factors include: diabetes, former smoker, and sedentary lifestyle Current Regimen: rosuvastatin 40 mg daily Continue medications today without changes.  Rosuvastatin refill sent   4. Healthcare Maintenance:  Pneumococcal - Current status: Up to date, last vaccine  was after age 6.   Shingles - Current status: No record (received 1 dose Zostavax long ago); Due for Shingrix x2 Influenza - Current status: Up to date for 2024  Due to receive the following vaccines: Shingrix and Covid Booster   Follow Up Follow up with clinical pharmacist via phone in 4-6 weeks   Future Appointments  Date Time Provider Department Center  10/08/2023  2:00 PM [facility] PHARMACIST [facility]  11/13/2023  3:00 PM [facility]  11/15/2023  3:00 PM Dale [place], MD [facility]  03/03/2024  1:30 PM [facility] ANNUAL WELLNESS VISIT [facility]   Loree Fee, PharmD Clinical Pharmacist [facility] [phone]

## 2023-08-22 ENCOUNTER — Telehealth: Payer: Self-pay

## 2023-08-22 NOTE — Telephone Encounter (Signed)
PAP: PAP application for Trulicity, [employer]) has been mailed to pt's home address on file. Will fax provider portion of application to provider's office when pt's portion is received.  PLEASE BE ADVISED THIS IS A RENEWAL APPLICATION MAILED TO HOME FOR 2025

## 2023-09-04 NOTE — Telephone Encounter (Signed)
Error

## 2023-09-05 NOTE — Telephone Encounter (Signed)
FAXED TO PROVIDER OFFICE OF [facility]LEN[facility]OTT  for TRULICITY 4MG /0.5ML([employer] CARES)   PLEASE BE ADVISED

## 2023-09-24 ENCOUNTER — Telehealth: Payer: Self-pay

## 2023-09-24 MED ORDER — GLUCOSE BLOOD VI STRP: ORAL_STRIP | 12 refills | Status: DC

## 2023-09-24 NOTE — Telephone Encounter (Signed)
Prescription Request  09/24/2023  LOV: Visit date not found  What is the name of the medication or equipment? glucose blood test strip  Have you contacted your pharmacy to request a refill? No   Which pharmacy would you like this sent to?  [facility] - [place], [place] - [address] [place] [address] Phone: [phone] Fax: [phone]   Patient notified that their request is being sent to the clinical staff for review and that they should receive a response within 2 business days.   Please advise at Mobile [phone] (mobile)  Patient states she has probably 10-15 strips left.

## 2023-09-24 NOTE — Telephone Encounter (Signed)
Prescription sent. Pt notified.

## 2023-10-03 NOTE — Progress Notes (Signed)
Pharmacy Medication Assistance Program Note    10/03/2023  Patient ID: Jody Wang, adult   DOB: 1945/11/22, 77 y.o.   MRN: [id number]     09/05/2023  Outreach Medication One  Initial Outreach Date (Medication One) 08/22/2023  Manufacturer Medication One [employer] Trulicity  Dose of Trulicity 4.5 MG/0.5ML  Type of Radiographer, therapeutic Assistance  Date Application Sent to Patient 08/22/2023  Application Items Requested Application;Proof of Income  Date Application Sent to Prescriber 09/05/2023  Name of Prescriber Dale   Date Application Received From Patient 09/05/2023  Application Items Received From Patient Application       Signature

## 2023-10-03 NOTE — Telephone Encounter (Signed)
RECEIVED PAT AND PROVIDER PAGES OF PAP: Application for Trulicity has been submitted to PAP Companies: [employer], via fax  PLEASE BE ADVISED

## 2023-10-08 ENCOUNTER — Other Ambulatory Visit: Payer: Medicare HMO | Admitting: Pharmacist

## 2023-10-08 DIAGNOSIS — E1165 Type 2 diabetes mellitus with hyperglycemia: Secondary | ICD-10-CM

## 2023-10-08 MED ORDER — GLUCOSE BLOOD VI STRP
ORAL_STRIP | 11 refills | Status: DC
Start: 2023-10-08 — End: 2024-05-26

## 2023-10-08 NOTE — Patient Instructions (Addendum)
Mr. Jody Wang,   It was a pleasure to see you today! As we discussed:?   A new Test strip prescription was sent to [facility] today. Consider reaching out to the pharmacy tomorrow if you have not heard from them.   Continue Trulicity 4.5 mg dose under the skin once weekly  Continue metformin XR three 500 mg tablets (1500 mg total) once daily with a meal. The extended release version you have is designed to have less stomach side effects and therefore, the dose does not need to be split up between morning/evening (if easier to remember, you may take all tablets together once per day).   Please reach out prior to your next scheduled appointment should you have any questions or concerns.   Thank you!   Future Appointments  Date Time Provider Department Center  11/13/2023  3:00 PM [facility]  11/15/2023  3:00 PM Dale [place], MD [facility]  12/24/2023  2:00 PM [facility] PHARMACIST [facility]  03/03/2024  1:30 PM [facility] ANNUAL WELLNESS VISIT [facility]    Berenice Primas, PharmD - Clinical Pharmacist

## 2023-10-08 NOTE — Progress Notes (Signed)
10/08/2023 Name: Jody Wang MRN: [id number] DOB: [birth year]  Subjective  Chief Complaint  Patient presents with   Diabetes    Reason for visit: Jody Wang is a 77 y.o. year old adult who presented for a telephone visit.   They were referred to the pharmacist by their PCP for assistance in managing diabetes.   Care Team: Primary Care Provider: Dale Olyphant, MD  Reason for visit: ?  Jody Wang is a 77 y.o. adult with a history of diabetes (type 2), who presents today for a follow up diabetes pharmacotherapy visit.? Pertinent PMH also includes CAD/stable angina, hypothyroidism.   Known DM Complications: no known complications    Date of Last Diabetes Related Visit: 07/16/23 with PCP; 08/21/23 with Pharmacist  At Last Diabetes Related Visit: ?  10/15: Start taking all metformin XR tablets once daily to improve adherence. 2025 Re-enrollment for Trulicity ([employer]illy MAP) 7/29: Patient did not increase metformin as instructed, taking 1000 mg daily   Prescription drug coverage: Payor: [employer] MEDICARE / Plan: [employer] MEDICARE HMO / Product Type: *No Product type* / . [employer] in 2025.   Reports that all medications are affordable.   Current Patient Assistance: Yes Trulicity ([employer]) - Approved through 11/06/23. 2025 renewal application submitted Nov 2024.   Medication Adherence: Patient denies missing doses of their medications.   Since Last visit / History of Present Illness: ?  Patient reports partially implementing plan from last visit. States that she takes 2-3 metformin tablets (500 mg each) per day. She states this depends on what time she wakes, and when she eats. Usually takes ~2 tablets with lunch, 1 tablet in the evening. Forgets evening dose a few times per week. Confirms remembering recommendation to take all 3 tablets once daily with meal. Unclear reasoning for continuing to split doses. No adverse effects at this time.    Reported DM  Regimen: ?  metformin XR 1500 mg daily (2 tablets afternoon, 1 tablet night) Trulicity 4.5 mg sq once weekly   DM medications tried in the past:?  Jardiance (08/2018) x1 yeast infection treated, returned shortly thereafter, Jardiance stopped)  SMBG Per BG meter: ? Tries to check fasting BG and after dinner/before bed though has been out of test strips for a few weeks and has not been checking BG. Rx was sent, though patient reports she has not heard anything from the pharmacy. Reports receiving them usually "from [place] Denies receiving from [facility]. Confirms brand: OneTouch Verio.    Hypo/Hyperglycemia: ?  Symptoms of hypoglycemia since last visit:? no  If yes, it was treated by: n/a  Symptoms of hyperglycemia since last visit:? no - none  Exercise: Limited by fall s/p surgery. Increasing walking as able  DM Prevention:  Statin: Taking; high intensity History of chronic kidney disease? no History of albuminuria? no, last UACR on 12/07/22 = 0.9 mg/g ACE/ARB - Not taking; Urine MA/CR Ratio - normal.  Last eye exam: 01/09/2023; No retinopathy present Last foot exam: 03/15/2023 Tobacco Use: Former smoker; 23py (quit 1985) Immunizations:? Flu: Up to date (Last: 07/16/2023); Pneumococcal: PPSV23 (07/02/23) PCV13 (10/2015 after age 77); Shingrix: No Record - DUE; Covid (12/2019, 01/2020, 08/2020; No Booster record)  Cardiovascular Risk Reduction History of clinical ASCVD? yes History of heart failure? no N/A History of hyperlipidemia? yes Current BMI: 25.7 kg/m2 (Ht 61 in, Wt 61.7 kg) Taking statin? yes; high intensity (rosuvastatin 40 mg daily) Taking aspirin? No    Taking SGLT-2i? no; Yeast infections on Jardiance  Taking GLP- 1 RA? yes    _______________________________________________  Objective    Review of Systems:?  Constitutional:? No fever, chills or unintentional weight loss  Cardiovascular:? No chest pain or pressure, shortness of breath, dyspnea on exertion, orthopnea or  LE edema  Pulmonary:? No cough or shortness of breath  GI:? No nausea, vomiting, constipation, diarrhea, abdominal pain, dyspepsia, change in bowel habits  Endocrine:? No polyuria, polyphagia or blurred vision  Psych:? No depression, anxiety, insomnia   Physical Examination:  Vitals:  Wt Readings from Last 3 Encounters:  07/16/23 136 lb (61.7 kg)  03/27/23 130 lb 12.8 oz (59.3 kg)  03/15/23 135 lb 12.8 oz (61.6 kg)   BP Readings from Last 3 Encounters:  07/16/23 118/72  03/27/23 116/74  03/15/23 126/72   Pulse Readings from Last 3 Encounters:  07/16/23 72  03/27/23 75  03/15/23 70     Labs:?  Lab Results  Component Value Date   HGBA1C 8.0 (H) 07/12/2023   HGBA1C 8.3 (H) 03/15/2023   HGBA1C 8.3 (H) 11/13/2022   GLUCOSE 145 (H) 07/12/2023   MICRALBCREAT 0.9 12/07/2022   MICRALBCREAT 1.1 07/08/2021   MICRALBCREAT 3.1 03/08/2021   CREATININE 0.74 07/12/2023   CREATININE 0.80 03/15/2023   CREATININE 0.67 11/13/2022   GFR 77.86 07/12/2023   GFR 71.06 03/15/2023   GFR 84.50 11/13/2022    Lab Results  Component Value Date   CHOL 107 07/12/2023   LDLCALC 39 07/12/2023   LDLCALC 41 03/15/2023   LDLCALC 41 11/13/2022   LDLDIRECT 188.8 06/30/2013   HDL 47.70 07/12/2023   HDL 47.10 03/15/2023   HDL 47.50 11/13/2022   AST 14 07/12/2023   AST 14 03/15/2023   ALT 12 07/12/2023   ALT 9 03/15/2023      Chemistry      Component Value Date/Time   NA 137 07/12/2023 1343   K 4.6 07/12/2023 1343   CL 103 07/12/2023 1343   CO2 26 07/12/2023 1343   BUN 11 07/12/2023 1343   CREATININE 0.74 07/12/2023 1343   CREATININE 0.71 11/05/2014 1237      Component Value Date/Time   CALCIUM 9.1 07/12/2023 1343   ALKPHOS 72 07/12/2023 1343   AST 14 07/12/2023 1343   ALT 12 07/12/2023 1343   BILITOT 0.3 07/12/2023 1343     The ASCVD Risk score (Arnett DK, et al., 2019) failed to calculate for the following reasons:   The valid total cholesterol range is 130 to 320  mg/dL  Assessment and Plan:   1. Diabetes, type 2: uncontrolled per last A1c of 8.0% (07/12/23), slightly improved from previous 8.3% (03/15/23) with goal <7% without hypoglycemia. Due to inconsistent adherence with metformin, pt was advised to take full dose once daily though has not made the switch, unclear why. Continues to forget her evening dose (1 tablet) a few times per week. Reports she has not heard from pharmacy regarding test strip refill, would like sent to [facility]. Has not checked BG in a couple of weeks. Current Regimen: Metformin XR 1500 mg daily (taking 1000 mg noon, 500 mg evening), Trulicity 4.5 mg weekly Continue medications today without changes.  Test strip refill sent to local pharmacy for quicker pick up. At start of the year, insurance will switch back to [facility] and she would like to use their mailorder.  SMBG: FBG, 2hPP  Future Consideration: Titrate metformin XR to goal dose, 2000 mg daily GLP1-RA: Could transition to GLP1 with stronger A1c-lowering propensity as needed (Ozempic through [employer] MAP) SGLT2i: Avoiding at this time per yeast infections on Jardiance TZD: Avoiding due to possible weight gain/increase in fracture risk. Insulin: Not unreasonable, though defer as last resort or as needed for severe hyperglycemia given risk hypoglycemia. Other safer options at this time.    2. ASCVD risk reduction: LDL at goal on last lipid panel with LDL 39 mg/dL, TG 960 mh/dL (02/09/39). LDL goal <55 mg/dL (secondary prevention).  Key risk factors include: diabetes, former smoker, and sedentary lifestyle Current Regimen: rosuvastatin 40 mg daily Continue medications today without changes.    4. Healthcare Maintenance:  Pneumococcal - Current status: Up to date, last vaccine was after age 51.   Shingles - Current status: No record (received 1 dose Zostavax long ago); Due for Shingrix x2 Influenza - Current status: Up to date for 2024  Due to receive the following  vaccines: Shingrix and Covid Booster   Follow Up PCP follow up in 1 month. Instructed patient to resume checking BG and recording her numbers prior to visit.  Follow up with clinical pharmacist via phone ~4-6 weeks after PCP visit or sooner as needed.   Future Appointments  Date Time Provider Department Center  11/13/2023  3:00 PM [facility]  11/15/2023  3:00 PM Dale [place], MD [facility]  12/24/2023  2:00 PM [facility] PHARMACIST [facility]  03/03/2024  1:30 PM [facility] ANNUAL WELLNESS VISIT [facility]   Loree Fee, PharmD Clinical Pharmacist [facility] [phone]

## 2023-11-13 ENCOUNTER — Other Ambulatory Visit (INDEPENDENT_AMBULATORY_CARE_PROVIDER_SITE_OTHER): Payer: Medicare Other

## 2023-11-13 DIAGNOSIS — E7800 Pure hypercholesterolemia, unspecified: Secondary | ICD-10-CM | POA: Diagnosis not present

## 2023-11-13 DIAGNOSIS — E1165 Type 2 diabetes mellitus with hyperglycemia: Secondary | ICD-10-CM

## 2023-11-14 LAB — BASIC METABOLIC PANEL
BUN: 17 mg/dL (ref 6–23)
CO2: 26 meq/L (ref 19–32)
Calcium: 9.7 mg/dL (ref 8.4–10.5)
Chloride: 101 meq/L (ref 96–112)
Creatinine, Ser: 0.81 mg/dL (ref 0.40–1.20)
GFR: 69.69 mL/min (ref >60.00)
Glucose, Bld: 145 mg/dL — ABNORMAL HIGH (ref 70–99)
Potassium: 4.6 meq/L (ref 3.5–5.1)
Sodium: 138 meq/L (ref 135–145)

## 2023-11-14 LAB — HEPATIC FUNCTION PANEL
ALT: 12 U/L (ref 0–35)
AST: 13 U/L (ref 0–37)
Albumin: 4.6 g/dL (ref 3.5–5.2)
Alkaline Phosphatase: 76 U/L (ref 39–117)
Bilirubin, Direct: 0.1 mg/dL (ref 0.0–0.3)
Total Bilirubin: 0.4 mg/dL (ref 0.2–1.2)
Total Protein: 7.2 g/dL (ref 6.0–8.3)

## 2023-11-14 LAB — LIPID PANEL
Cholesterol: 144 mg/dL (ref 0–200)
HDL: 49.4 mg/dL (ref >39.00)
LDL Cholesterol: 75 mg/dL (ref 0–99)
NonHDL: 94.11
Total CHOL/HDL Ratio: 3
Triglycerides: 97 mg/dL (ref 0.0–149.0)
VLDL: 19.4 mg/dL (ref 0.0–40.0)

## 2023-11-14 LAB — HEMOGLOBIN A1C: Hgb A1c MFr Bld: 7.9 % — ABNORMAL HIGH (ref 4.6–6.5)

## 2023-11-15 ENCOUNTER — Ambulatory Visit (INDEPENDENT_AMBULATORY_CARE_PROVIDER_SITE_OTHER): Payer: Medicare Other | Admitting: Internal Medicine

## 2023-11-15 ENCOUNTER — Encounter: Payer: Self-pay | Admitting: Internal Medicine

## 2023-11-15 VITALS — BP 112/70 | HR 84 | Temp 97.8°F | Ht 61.0 in | Wt 133.0 lb

## 2023-11-15 DIAGNOSIS — E7800 Pure hypercholesterolemia, unspecified: Secondary | ICD-10-CM | POA: Diagnosis not present

## 2023-11-15 DIAGNOSIS — F32A Depression, unspecified: Secondary | ICD-10-CM

## 2023-11-15 DIAGNOSIS — I700 Atherosclerosis of aorta: Secondary | ICD-10-CM

## 2023-11-15 DIAGNOSIS — R918 Other nonspecific abnormal finding of lung field: Secondary | ICD-10-CM | POA: Diagnosis not present

## 2023-11-15 DIAGNOSIS — I779 Disorder of arteries and arterioles, unspecified: Secondary | ICD-10-CM

## 2023-11-15 DIAGNOSIS — E039 Hypothyroidism, unspecified: Secondary | ICD-10-CM | POA: Diagnosis not present

## 2023-11-15 DIAGNOSIS — Z7984 Long term (current) use of oral hypoglycemic drugs: Secondary | ICD-10-CM | POA: Diagnosis not present

## 2023-11-15 DIAGNOSIS — Z7985 Long-term (current) use of injectable non-insulin antidiabetic drugs: Secondary | ICD-10-CM

## 2023-11-15 DIAGNOSIS — K219 Gastro-esophageal reflux disease without esophagitis: Secondary | ICD-10-CM

## 2023-11-15 DIAGNOSIS — M545 Low back pain, unspecified: Secondary | ICD-10-CM | POA: Diagnosis not present

## 2023-11-15 DIAGNOSIS — E1165 Type 2 diabetes mellitus with hyperglycemia: Secondary | ICD-10-CM | POA: Diagnosis not present

## 2023-11-15 DIAGNOSIS — I25118 Atherosclerotic heart disease of native coronary artery with other forms of angina pectoris: Secondary | ICD-10-CM

## 2023-11-15 MED ORDER — LEVOTHYROXINE SODIUM 88 MCG PO TABS: 88.0000 ug | ORAL_TABLET | Freq: Every day | ORAL | 2 refills | Status: AC

## 2023-11-15 MED ORDER — TRULICITY 4.5 MG/0.5ML ~~LOC~~ SOAJ: 4.5000 mg | SUBCUTANEOUS | 2 refills | Status: AC

## 2023-11-15 MED ORDER — METFORMIN HCL ER 500 MG PO TB24: ORAL_TABLET | ORAL | 1 refills | Status: DC

## 2023-11-15 MED ORDER — PANTOPRAZOLE SODIUM 40 MG PO TBEC: 40.0000 mg | DELAYED_RELEASE_TABLET | Freq: Every day | ORAL | 2 refills | Status: AC

## 2023-11-15 MED ORDER — TRAZODONE HCL 50 MG PO TABS: 50.0000 mg | ORAL_TABLET | Freq: Every day | ORAL | 1 refills | Status: DC

## 2023-11-15 NOTE — Progress Notes (Signed)
 Subjective:    Patient ID: Jody Wang, adult    DOB: [birth year], 78 y.o.   MRN: [id number]  Patient here for  Chief Complaint  Patient presents with   Medical Management of Chronic Issues    HPI Here for a scheduled follow up - f/u regarding diabetes, hypertension and hypercholesterolemia. She has not been checking her sugars regularly. Discussed low carb diet and exercise. Discussed recent labs.  A1c 7.9 - down from the last two checks. No chest pain reported.  Breathing stable. No abdominal pain or bowel change reported. She does report some low back pain and pain in her legs. Worse in am. Discussed stretching. Discussed PT.   Past Medical History:  Diagnosis Date   AK (actinic keratosis)    Anemia    Aortic atherosclerosis (HCC)    Carotid artery disease (HCC)    Carotid bruit    Chest pain 01/28/2017   Complication of anesthesia    hard time waking up x 1 surgery   Depression    Diabetes mellitus without complication (HCC)    GERD (gastroesophageal reflux disease)    Heart murmur    Hypercholesterolemia    Hypothyroidism    Lung nodule, multiple    Osteoarthritis of knee    Shingles    Wears dentures    partial lower   Past Surgical History:  Procedure Laterality Date   ABDOMINAL HYSTERECTOMY  1985   ovaries not removed   BREAST CYST EXCISION Right    BREAST CYST EXCISION Left    CATARACT EXTRACTION W/PHACO Right 02/16/2016   Procedure: CATARACT EXTRACTION PHACO AND INTRAOCULAR LENS PLACEMENT (IOC) RIGHT ;  Surgeon: Dene Etienne, MD;  Location: [facility];  Service: Ophthalmology;  Laterality: Right;  DIABETIC - oral meds   CATARACT EXTRACTION W/PHACO Left 03/15/2016   Procedure: CATARACT EXTRACTION PHACO AND INTRAOCULAR LENS PLACEMENT (IOC)left eye;  Surgeon: Dene Etienne, MD;  Location: [facility];  Service: Ophthalmology;  Laterality: Left;  DIABETIC - oral meds   FOOT SURGERY Bilateral    INNER EAR SURGERY Left    KNEE  ARTHROPLASTY Right 03/17/2022   Procedure: COMPUTER ASSISTED TOTAL KNEE ARTHROPLASTY;  Surgeon: Mardee Lynwood SQUIBB, MD;  Location: [facility];  Service: Orthopedics;  Laterality: Right;   KNEE ARTHROPLASTY Left 08/23/2022   Procedure: COMPUTER ASSISTED TOTAL KNEE ARTHROPLASY;  Surgeon: Mardee Lynwood SQUIBB, MD;  Location: [facility];  Service: Orthopedics;  Laterality: Left;   Family History  Problem Relation Age of Onset   Diabetes Father    Coronary artery disease Father        s/p CABG   Hypertension Father    Hypercholesterolemia Father    Hypertension Brother    Diabetes Brother    Hypertension Sister    Glaucoma Sister    Diabetes Other        niece   Breast cancer Maternal Aunt    Breast cancer Paternal Aunt    Cervical cancer Paternal Aunt    Breast cancer Cousin    Breast cancer Other    Social History   Socioeconomic History   Marital status: Widowed    Spouse name: Not on file   Number of children: 3   Years of education: Not on file   Highest education level: Some college, no degree  Occupational History   Not on file  Tobacco Use   Smoking status: Former    Current packs/day: 0.00    Average packs/day: 1 pack/day for 23.0 years (  23.0 ttl pk-yrs)    Types: Cigarettes    Start date: 11/19/1960    Quit date: 11/20/1983    Years since quitting: 40.0   Smokeless tobacco: Never  Vaping Use   Vaping status: Never Used  Substance and Sexual Activity   Alcohol use: Yes    Comment: every monday   Drug use: No   Sexual activity: Never  Other Topics Concern   Not on file  Social History Narrative   Lives at home with son.    Social Drivers of Corporate Investment Banker Strain: Low Risk  (03/26/2023)   Overall Financial Resource Strain (CARDIA)    Difficulty of Paying Living Expenses: Not hard at all  Food Insecurity: No Food Insecurity (03/26/2023)   Hunger Vital Sign    Worried About Running Out of Food in the Last Year: Never true    Ran Out of Food in the Last Year:  Never true  Transportation Needs: No Transportation Needs (03/26/2023)   PRAPARE - Administrator, Civil Service (Medical): No    Lack of Transportation (Non-Medical): No  Physical Activity: Inactive (03/26/2023)   Exercise Vital Sign    Days of Exercise per Week: 0 days    Minutes of Exercise per Session: 20 min  Stress: No Stress Concern Present (03/26/2023)   [employer] of Occupational Health - Occupational Stress Questionnaire    Feeling of Stress : Not at all  Social Connections: Moderately Integrated (03/26/2023)   Social Connection and Isolation Panel [NHANES]    Frequency of Communication with Friends and Family: Three times a week    Frequency of Social Gatherings with Friends and Family: Three times a week    Attends Religious Services: More than 4 times per year    Active Member of Clubs or Organizations: Yes    Attends Banker Meetings: More than 4 times per year    Marital Status: Widowed     Review of Systems  Constitutional:  Negative for appetite change and unexpected weight change.  HENT:  Negative for congestion and sinus pressure.   Respiratory:  Negative for cough, chest tightness and shortness of breath.   Cardiovascular:  Negative for chest pain and palpitations.  Gastrointestinal:  Negative for abdominal pain, diarrhea, nausea and vomiting.  Genitourinary:  Negative for difficulty urinating and dysuria.  Musculoskeletal:  Positive for back pain. Negative for joint swelling and myalgias.  Skin:  Negative for color change and rash.  Neurological:  Negative for dizziness and headaches.  Psychiatric/Behavioral:  Negative for agitation and dysphoric mood.        Objective:     BP 112/70   Pulse 84   Temp 97.8 F (36.6 C) (Oral)   Ht 5' 1 (1.549 m)   Wt 133 lb (60.3 kg)   SpO2 99%   BMI 25.13 kg/m  Wt Readings from Last 3 Encounters:  11/15/23 133 lb (60.3 kg)  07/16/23 136 lb (61.7 kg)  03/27/23 130 lb 12.8 oz (59.3 kg)     Physical Exam Vitals reviewed.  Constitutional:      General: He is not in acute distress.    Appearance: Normal appearance.  HENT:     Head: Normocephalic and atraumatic.     Right Ear: External ear normal.     Left Ear: External ear normal.     Mouth/Throat:     Pharynx: No oropharyngeal exudate or posterior oropharyngeal erythema.  Eyes:     General: No  scleral icterus.       Right eye: No discharge.        Left eye: No discharge.     Conjunctiva/sclera: Conjunctivae normal.  Neck:     Thyroid : No thyromegaly.  Cardiovascular:     Rate and Rhythm: Normal rate and regular rhythm.  Pulmonary:     Effort: No respiratory distress.     Breath sounds: Normal breath sounds. No wheezing.  Abdominal:     General: Bowel sounds are normal.     Palpations: Abdomen is soft.     Tenderness: There is no abdominal tenderness.  Musculoskeletal:        General: No swelling or tenderness.     Cervical back: Neck supple. No tenderness.     Comments: Negative SLR.   Lymphadenopathy:     Cervical: No cervical adenopathy.  Skin:    Findings: No erythema or rash.  Neurological:     Mental Status: He is alert.  Psychiatric:        Mood and Affect: Mood normal.        Behavior: Behavior normal.      Outpatient Encounter Medications as of 11/15/2023  Medication Sig   acetaminophen  (TYLENOL ) 325 MG tablet Take 650 mg by mouth every 6 (six) hours as needed.   celecoxib  (CELEBREX ) 200 MG capsule Take 1 capsule (200 mg total) by mouth 2 (two) times daily.   cholecalciferol  (VITAMIN D ) 1000 units tablet Take 1,000 Units by mouth daily.   ferrous sulfate  325 (65 FE) MG EC tablet Take 325 mg by mouth daily.   FLUoxetine  (PROZAC ) 20 MG capsule Take 1 capsule (20 mg total) by mouth daily.   fluticasone  (FLONASE ) 50 MCG/ACT nasal spray Use 2 sprays in both nostrils daily   glucose blood test strip Use as instructed to check blood sugars to check blood sugars twice daily. Dx E11.9   glucose  blood test strip Use as instructed to check blood sugar twice daily. Dx E11.9   levocetirizine (XYZAL ) 5 MG tablet Take 1 tablet (5 mg total) by mouth daily.   rosuvastatin  (CRESTOR ) 40 MG tablet Take 1 tablet (40 mg total) by mouth daily.   vitamin B-12 (CYANOCOBALAMIN ) 1000 MCG tablet Take 1,000 mcg by mouth daily.   [DISCONTINUED] metFORMIN  (GLUCOPHAGE -XR) 500 MG 24 hr tablet Take 3 tablets (1,500 mg total) by mouth daily.   Dulaglutide  (TRULICITY ) 4.5 MG/0.5ML SOAJ Inject 4.5 mg as directed once a week.   levothyroxine  (SYNTHROID ) 88 MCG tablet Take 1 tablet (88 mcg total) by mouth daily.   metFORMIN  (GLUCOPHAGE -XR) 500 MG 24 hr tablet Take 2 tablets bid   pantoprazole  (PROTONIX ) 40 MG tablet Take 1 tablet (40 mg total) by mouth daily.   traZODone  (DESYREL ) 50 MG tablet Take 1 tablet (50 mg total) by mouth daily.   [DISCONTINUED] Dulaglutide  (TRULICITY ) 4.5 MG/0.5ML SOPN Inject 4.5 mg as directed once a week.   [DISCONTINUED] levothyroxine  (SYNTHROID ) 88 MCG tablet TAKE 1 TABLET BY MOUTH DAILY   [DISCONTINUED] pantoprazole  (PROTONIX ) 40 MG tablet TAKE 1 TABLET BY MOUTH DAILY   [DISCONTINUED] traZODone  (DESYREL ) 50 MG tablet Take 1 tablet (50 mg total) by mouth daily.   No facility-administered encounter medications on file as of 11/15/2023.     Lab Results  Component Value Date   WBC 10.5 07/12/2023   HGB 13.5 07/12/2023   HCT 41.7 07/12/2023   PLT 335.0 07/12/2023   GLUCOSE 145 (H) 11/13/2023   CHOL 144 11/13/2023   TRIG 97.0 11/13/2023  HDL 49.40 11/13/2023   LDLDIRECT 188.8 06/30/2013   LDLCALC 75 11/13/2023   ALT 12 11/13/2023   AST 13 11/13/2023   NA 138 11/13/2023   K 4.6 11/13/2023   CL 101 11/13/2023   CREATININE 0.81 11/13/2023   BUN 17 11/13/2023   CO2 26 11/13/2023   TSH 2.90 11/13/2022   HGBA1C 7.9 (H) 11/13/2023   MICROALBUR 1.1 12/07/2022    MM 3D SCREEN BREAST BILATERAL Result Date: 12/28/2022 CLINICAL DATA:  Screening. EXAM: DIGITAL SCREENING BILATERAL  MAMMOGRAM WITH TOMOSYNTHESIS AND CAD TECHNIQUE: Bilateral screening digital craniocaudal and mediolateral oblique mammograms were obtained. Bilateral screening digital breast tomosynthesis was performed. The images were evaluated with computer-aided detection. COMPARISON:  Previous exam(s). ACR Breast Density Category b: There are scattered areas of fibroglandular density. FINDINGS: There are no findings suspicious for malignancy. IMPRESSION: No mammographic evidence of malignancy. A result letter of this screening mammogram will be mailed directly to the patient. RECOMMENDATION: Screening mammogram in one year. (Code:SM-B-01Y) BI-RADS CATEGORY  1: Negative. Electronically Signed   By: Reyes Phi M.D.   On: 12/28/2022 17:39       Assessment & Plan:  Type 2 diabetes mellitus with hyperglycemia, without long-term current use of insulin  [facility]) Assessment & Plan: Continues on trulicity  and metformin .   Recent a1c 7.9. Discussed low carb diet and exercise as tolerated. Brought in no sugar readings.  Check and record sugars and send in readings. Follow met b and A1c. Hold on making changes in medication. Follow.   Orders: -     Microalbumin / creatinine urine ratio -     metFORMIN  HCl ER; Take 2 tablets bid  Dispense: 360 tablet; Refill: 1  Mild depression Assessment & Plan: Overall appears to be doing well and handling stress.  Continue prozac .  Follow.     Lung nodule, multiple Assessment & Plan: CT chest 08/2021 - Stable small pulmonary nodules, the largest 6 mm in the left upper lobe. These are compatible with benign nodules. No new or enlarging pulmonary nodules.   Midline low back pain without sciatica, unspecified chronicity Assessment & Plan: Low back pain as outlined.  Persistent.  Worse in am. Appears to get some better once up and moving.  Refer to PT for further evaluation and treatment.   Orders: -     Ambulatory referral to Physical Therapy  Hypothyroidism, unspecified  type Assessment & Plan: On thyroid  replacement.  Follow tsh.    Hypercholesterolemia Assessment & Plan: Continue crestor .  Low cholesterol diet and exercise.  Follow lipid panel and liver function tests.    Gastroesophageal reflux disease, unspecified whether esophagitis present Assessment & Plan: Continue protonix .  No upper symptoms reported.    Coronary artery disease of native artery of native heart with stable angina pectoris [facility]) Assessment & Plan: Previously seen on chest CT.  Saw Dr Gollan.  No chest pain.  Continue risk factor modification.  Continue crestor .    Bilateral carotid artery disease, unspecified type (HCC) Assessment & Plan: Previous left carotid 60-79% and right 40-50%.  Continue crestor .  Needs f/u with [facility] to monitor. Discussed again with her today.  Continue risk factor modification.  Continue crestor . Has declined further evaluation.    Aortic atherosclerosis (HCC) Assessment & Plan: Continue crestor .    Other orders -     Trulicity ; Inject 4.5 mg as directed once a week.  Dispense: 8 mL; Refill: 2 -     Levothyroxine  Sodium; Take 1 tablet (88 mcg total) by mouth  daily.  Dispense: 100 tablet; Refill: 2 -     Pantoprazole  Sodium; Take 1 tablet (40 mg total) by mouth daily.  Dispense: 100 tablet; Refill: 2 -     traZODone  HCl; Take 1 tablet (50 mg total) by mouth daily.  Dispense: 90 tablet; Refill: 1     Allena Hamilton, MD

## 2023-11-18 ENCOUNTER — Encounter: Payer: Self-pay | Admitting: Internal Medicine

## 2023-11-18 NOTE — Assessment & Plan Note (Signed)
Previously seen on chest CT.  Saw Dr Mariah Milling.  No chest pain.  Continue risk factor modification.  Continue crestor.

## 2023-11-18 NOTE — Assessment & Plan Note (Signed)
Continue protonix.  No upper symptoms reported.

## 2023-11-18 NOTE — Assessment & Plan Note (Signed)
Overall appears to be doing well and handling stress.  Continue prozac.  Follow.

## 2023-11-18 NOTE — Assessment & Plan Note (Signed)
CT chest 08/2021 - Stable small pulmonary nodules, the largest 6 mm in the left upper lobe. These are compatible with benign nodules. No new or enlarging pulmonary nodules. 

## 2023-11-18 NOTE — Assessment & Plan Note (Signed)
 Low back pain as outlined.  Persistent.  Worse in am. Appears to get some better once up and moving.  Refer to PT for further evaluation and treatment.

## 2023-11-18 NOTE — Assessment & Plan Note (Signed)
 Continue crestor.  Low cholesterol diet and exercise.  Follow lipid panel and liver function tests.

## 2023-11-18 NOTE — Assessment & Plan Note (Signed)
 Continues on trulicity and metformin.   Recent a1c 7.9. Discussed low carb diet and exercise as tolerated. Brought in no sugar readings.  Check and record sugars and send in readings. Follow met b and A1c. Hold on making changes in medication. Follow.

## 2023-11-18 NOTE — Assessment & Plan Note (Signed)
 Previous left carotid 60-79% and right 40-50%.  Continue crestor.  Needs f/u with [facility] to monitor. Discussed again with her today.  Continue risk factor modification.  Continue crestor. Has declined further evaluation.

## 2023-11-18 NOTE — Assessment & Plan Note (Signed)
 On thyroid replacement.  Follow tsh.

## 2023-11-18 NOTE — Assessment & Plan Note (Signed)
 Continue crestor

## 2023-11-20 IMAGING — DX DG CHEST 2V
2 series · 2 of 2 positions shown · non-contrast
Comparison: 02/01/2017

CLINICAL DATA: Cough

EXAM:
CHEST - 2 VIEW

[chest pa]
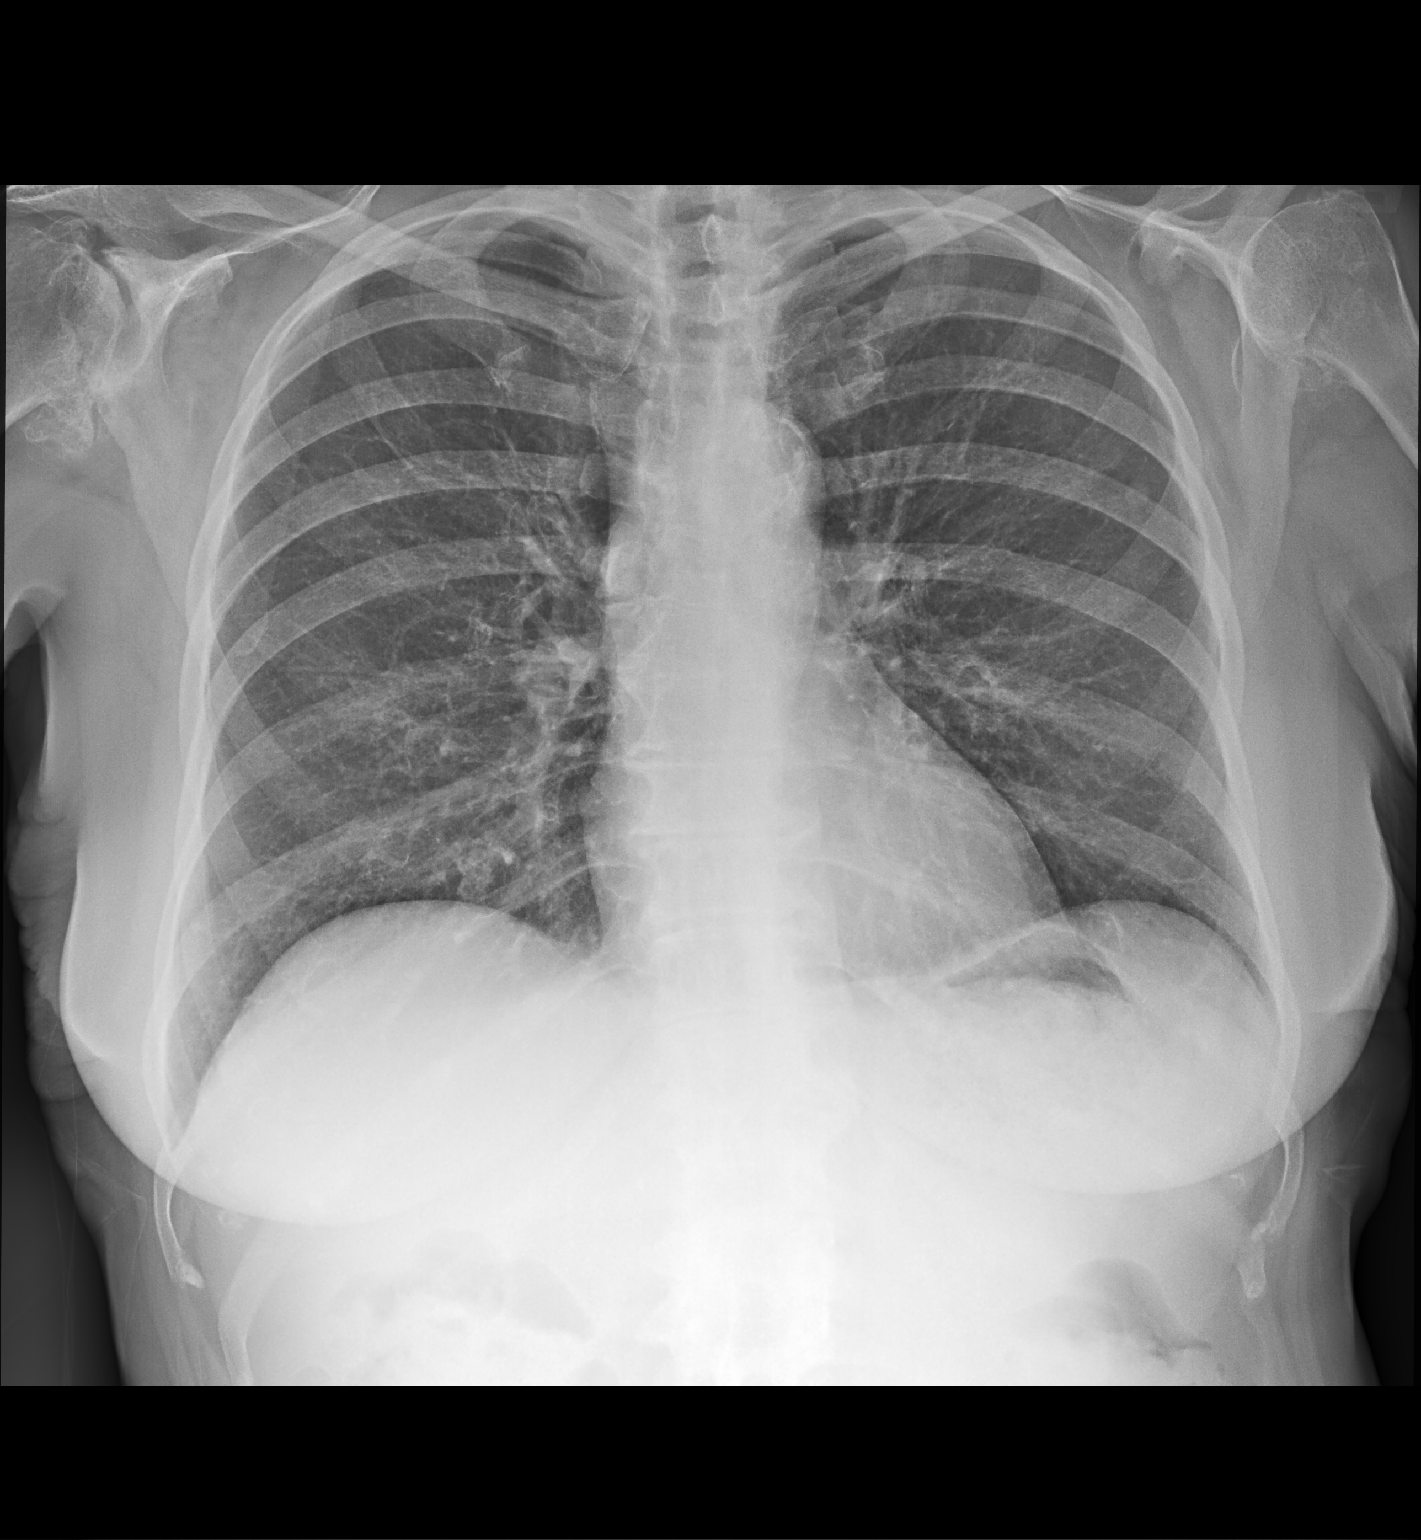

[chest lat]
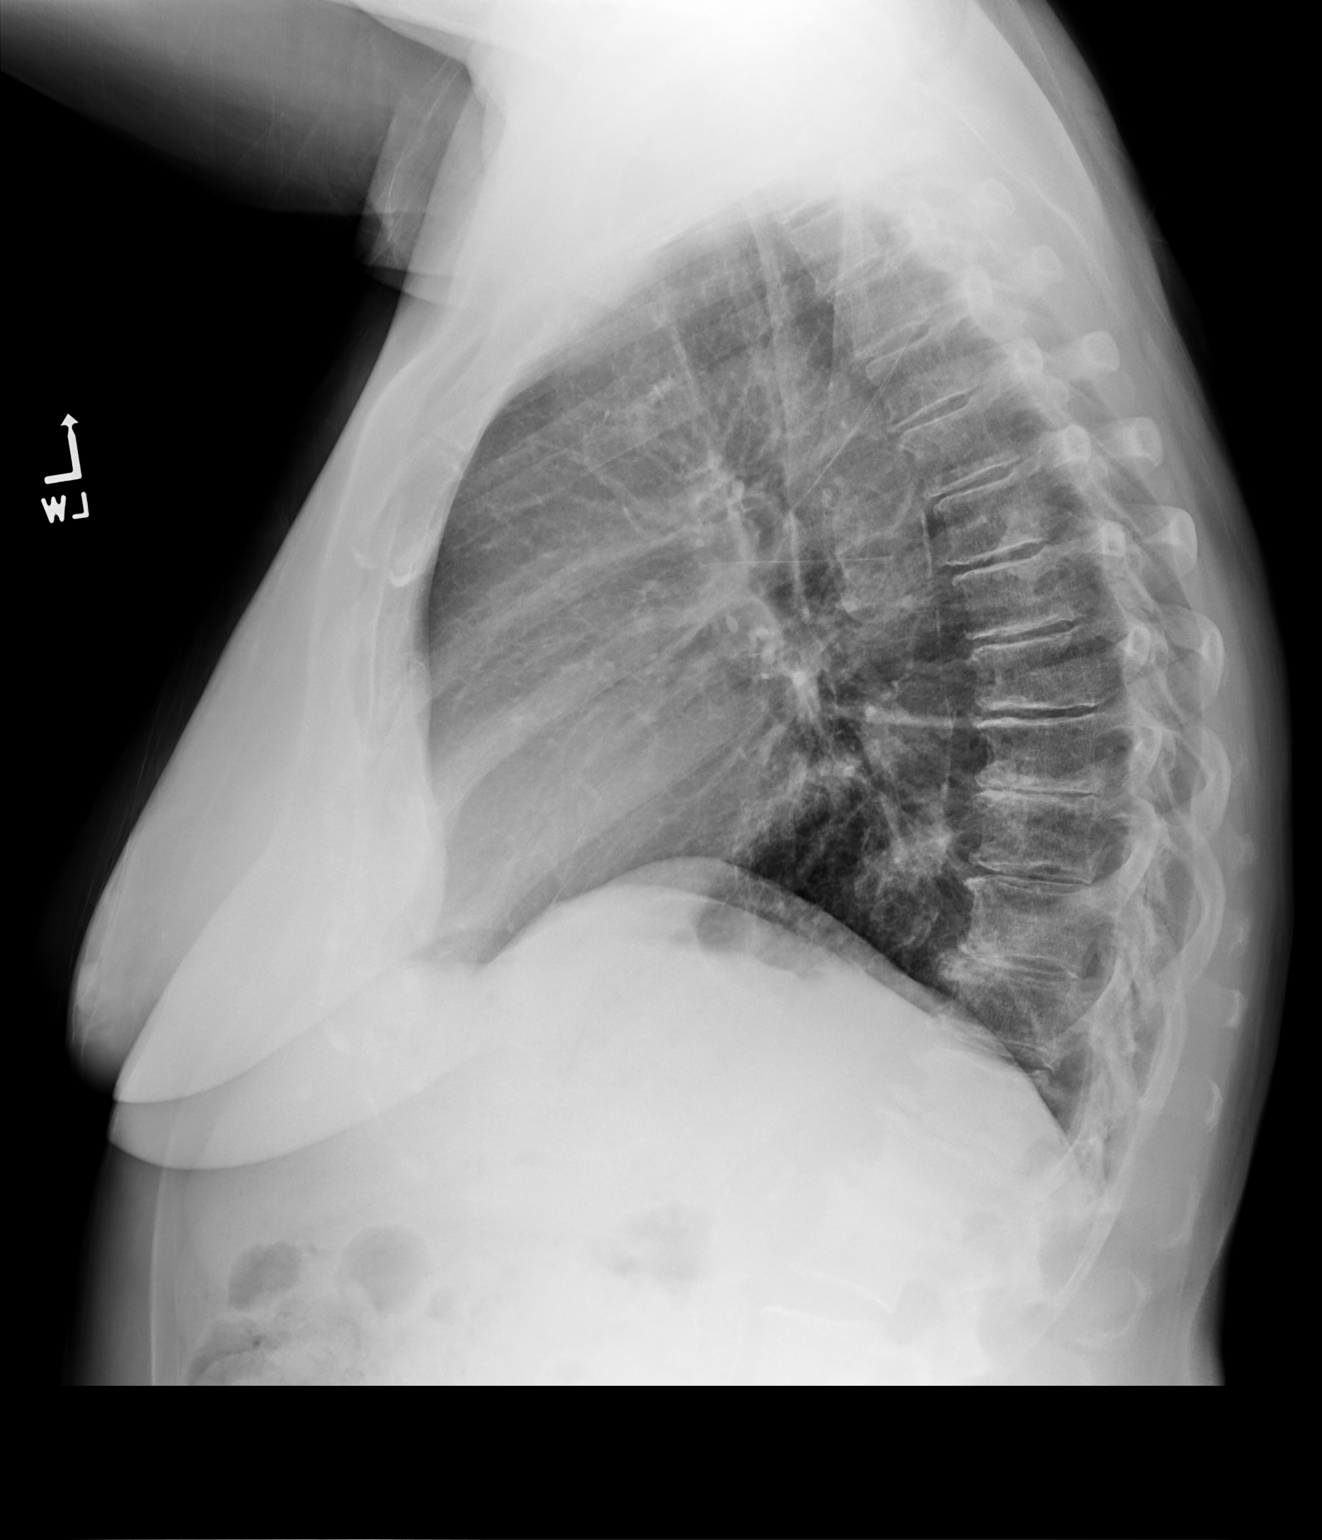

[2 of 2 positions shown; findings below may reference images not displayed]

FINDINGS: The heart size and mediastinal contours are within normal limits.
Both lungs are clear. The visualized skeletal structures are
unremarkable.
IMPRESSION: No active cardiopulmonary disease.

## 2023-12-07 NOTE — Telephone Encounter (Signed)
PAP: Patient assistance application for Trulicity has been approved by PAP Companies: [employer] from 10/03/2023 to 13/31/2025. Medication should be delivered to PAP Delivery: Home. For further shipping updates, please contact [employer] at [phone]. Patient ID is: [id number]   PLEASE BE ADVISED

## 2023-12-24 ENCOUNTER — Other Ambulatory Visit: Payer: Medicare HMO

## 2023-12-31 ENCOUNTER — Other Ambulatory Visit (INDEPENDENT_AMBULATORY_CARE_PROVIDER_SITE_OTHER): Payer: Medicare Other

## 2023-12-31 DIAGNOSIS — E1165 Type 2 diabetes mellitus with hyperglycemia: Secondary | ICD-10-CM

## 2023-12-31 DIAGNOSIS — Z7984 Long term (current) use of oral hypoglycemic drugs: Secondary | ICD-10-CM

## 2023-12-31 NOTE — Patient Instructions (Addendum)
 Mr. ALAZE GARVERICK,   It was a pleasure to speak with you today! As we discussed:?   Continue Trulicity 4.5 mg dose under the skin once weekly  Continue metformin XR three 500 mg tablets (1500 mg total) once daily with a meal. The extended release version you have is designed to have less stomach side effects and therefore, the dose does not need to be split up between morning/evening (if easier to remember, you may take all tablets together once per day).  If you prefer to split your tablets up between meals, this is also perfectly fine. We discussed 2 tablets with first meal of the day (even if you wake up late). Then 1 tablet with evening meal. If you are not eating a meal and you still have a dose to take, you may take it with a protein snack (example = small spoonful of low sugar peanut butter)   Please reach out prior to your next scheduled appointment should you have any questions or concerns.   Thank you!   Future Appointments  Date Time Provider Department Center  02/18/2024  2:00 PM [facility] PHARMACIST [facility]  03/03/2024  1:40 PM [facility] ANNUAL WELLNESS VISIT [facility]  03/18/2024  2:00 PM [facility] LAB [facility]  03/20/2024  1:30 PM Dale Burdett, MD [facility]    Berenice Primas, PharmD - Clinical Pharmacist

## 2023-12-31 NOTE — Progress Notes (Signed)
 12/31/2023 Name: Jody Wang MRN: [id number] DOB: [birth year]  Subjective  Chief Complaint  Patient presents with   Diabetes    Reason for visit: Jody Wang is a 78 y.o. year old adult who presented for a telephone visit.   They were referred to the pharmacist by their PCP for assistance in managing diabetes.   Care Team: Primary Care Provider: Dale Sierra Brooks, MD  Reason for visit: ?  Jody Wang is a 78 y.o. adult with a history of diabetes (type 2), who presents today for a follow up diabetes pharmacotherapy visit.? Pertinent PMH also includes CAD/stable angina, hypothyroidism.   Known DM Complications: no known complications    Date of Last Diabetes Related Visit: 11/14/22 with PCP; 10/08/23 with Pharmacist  At Last Diabetes Related Visit: ?  1/9: No BG readings. ?? metformin XR 1000 mg BID 10/15: Start taking all metformin XR tablets once daily to improve adherence. 2025 Re-enrollment for Trulicity ([facility]) 7/29: Patient did not increase metformin as instructed, taking 1000 mg daily   Prescription drug coverage: Payor: [employer] MEDICARE / Plan: [employer] MEDICARE HMO / Product Type: *No Product type* / . [employer] in 2025.  - Reports that all medications are affordable.  - Current Patient Assistance: Yes Trulicity ([facility]) - Approved through 11/05/24.   Since Last visit / History of Present Illness: ?  Since last PCP visit 1/9, patient has resumed SMBG. Feels her sugars are improving. Has been focusing more on lowering her carb intake. Notes that her Son has moved out which she feels has helped her adhere to her diet (as he would bring home sweets/'junk food'). Now just lives with her sister and they try to cook at home most days.   Continues to report 2-3 metformin tablets (500 mg each) per day. She states this depends on what time she wakes, and when she eats. Usually takes 1-2 tablets with lunch,  sometimes 1 tablet in the evening. Unclear  reasoning for continuing to split doses. No adverse effects at this time.    Reported DM Regimen: ?  Metformin XR 500 mg (2-3 tablets per day) Trulicity 4.5 mg sq once weekly   DM medications tried in the past:?  Jardiance (08/2018) x1 yeast infection treated, returned shortly thereafter, Jardiance stopped)  SMBG Per BG meter (OneTouch Verio): ? Tries to check fasting BG and after dinner/before bed though has been out of test strips for a few weeks and has not been checking BG. Rx was sent, though patient reports she has not heard anything from the pharmacy. Reports receiving them usually "from [place]". Denies receiving from [facility] Rx. Confirms brand: OneTouch Verio.  FBG: 148 this morning (had cereal before bed), 528,413,[phone] After dinner: Cannot recall readings. Reports these sugars have been 'fine'   Hypo/Hyperglycemia: ?  Symptoms of hypoglycemia since last visit:? no  If yes, it was treated by: n/a  Symptoms of hyperglycemia since last visit:? no - none  Reported Diet:   Wakes ~11 am: Breakfast: sausage/eggs/bread when she does wakes. Sometimes out to eat.  Lunch: skips Dinner (6-7 pm): Cooks (meat + vegetable). Sometimes potatoes or corn. Stopped rice since son moved out.  Snacks: before bed (Banana + peanut butter) Beverages: Coke zero, water  Exercise: (Double knee replacement, May/Oct) - Increasing walking as able (once it gets warmer, want to walk outside 3 times per week)  DM Prevention:  Statin: Taking; high intensity History of chronic kidney disease? no History of albuminuria? no, last UACR  on 12/07/22 = 0.9 mg/g ACE/ARB - Not taking; Urine MA/CR Ratio - normal.  Last eye exam: 01/09/2023; No retinopathy present Last foot exam: 03/15/2023 Tobacco Use: Former smoker; 23py (quit 1985) Immunizations:? Flu: Up to date (Last: 07/16/2023); Pneumococcal: PPSV23 (07/02/23) PCV13 (10/2015 after age 63); Shingrix: No Record - DUE; Covid (12/2019, 01/2020, 08/2020; No Booster  record)  Cardiovascular Risk Reduction History of clinical ASCVD? yes History of heart failure? no N/A History of hyperlipidemia? yes Current BMI: 25.7 kg/m2 (Ht 61 in, Wt 61.7 kg) Taking statin? yes; high intensity (rosuvastatin 40 mg daily) Taking aspirin? No    Taking SGLT-2i? no; Yeast infections on Jardiance Taking GLP- 1 RA? yes    _______________________________________________  Objective    Review of Systems:?  Constitutional:? No fever, chills or unintentional weight loss  Cardiovascular:? No chest pain or pressure, shortness of breath, dyspnea on exertion, orthopnea or LE edema  Pulmonary:? No cough or shortness of breath  GI:? No nausea, vomiting, constipation, diarrhea, abdominal pain, dyspepsia, change in bowel habits  Endocrine:? No polyuria, polyphagia or blurred vision  Psych:? No depression, anxiety, insomnia   Physical Examination:  Vitals:  Wt Readings from Last 3 Encounters:  11/15/23 133 lb (60.3 kg)  07/16/23 136 lb (61.7 kg)  03/27/23 130 lb 12.8 oz (59.3 kg)   BP Readings from Last 3 Encounters:  11/15/23 112/70  07/16/23 118/72  03/27/23 116/74   Pulse Readings from Last 3 Encounters:  11/15/23 84  07/16/23 72  03/27/23 75     Labs:?  Lab Results  Component Value Date   HGBA1C 7.9 (H) 11/13/2023   HGBA1C 8.0 (H) 07/12/2023   HGBA1C 8.3 (H) 03/15/2023   GLUCOSE 145 (H) 11/13/2023   MICRALBCREAT 0.9 12/07/2022   MICRALBCREAT 1.1 07/08/2021   MICRALBCREAT 3.1 03/08/2021   CREATININE 0.81 11/13/2023   CREATININE 0.74 07/12/2023   CREATININE 0.80 03/15/2023   GFR 69.69 11/13/2023   GFR 77.86 07/12/2023   GFR 71.06 03/15/2023    Lab Results  Component Value Date   CHOL 144 11/13/2023   LDLCALC 75 11/13/2023   LDLCALC 39 07/12/2023   LDLCALC 41 03/15/2023   LDLDIRECT 188.8 06/30/2013   HDL 49.40 11/13/2023   HDL 47.70 07/12/2023   HDL 47.10 03/15/2023   AST 13 11/13/2023   AST 14 07/12/2023   ALT 12 11/13/2023   ALT 12  07/12/2023      Chemistry      Component Value Date/Time   NA 138 11/13/2023 1434   K 4.6 11/13/2023 1434   CL 101 11/13/2023 1434   CO2 26 11/13/2023 1434   BUN 17 11/13/2023 1434   CREATININE 0.81 11/13/2023 1434   CREATININE 0.71 11/05/2014 1237      Component Value Date/Time   CALCIUM 9.7 11/13/2023 1434   ALKPHOS 76 11/13/2023 1434   AST 13 11/13/2023 1434   ALT 12 11/13/2023 1434   BILITOT 0.4 11/13/2023 1434     The 10-year ASCVD risk score (Arnett DK, et al., 2019) is: 30.5%  Assessment and Plan:   1. Diabetes, type 2: uncontrolled per last A1c of 7.9% (11/13/23), relatively unchanged from previous 8.0%. Goal <7% without hypoglycemia. Since last A1c, patient reports focusing on carb intake (cut out rice, more mindful of carbs w dinner). Metformin compliance remains variable (2-3 tabs/day). FBG from memory reported ~110s-140 mg/dL. Cannot recall sugars any other times of the day though reports they are "Normal" after dinner.   Current Regimen: Metformin XR 1000 mg BID (  only taking 1000-1500 mg daily), Trulicity 4.5 mg weekly Emphasized consistency w metformin - okay to take an hour or two late if sleeps in. If not eating, take with protein (such as spoon of peanut butter) Exercise: Patient set a goal today to start walking outside 3 days per week once the weather warms  Future Consideration: GLP1-RA: Consider transition to GLP1 with stronger A1c-lowering propensity if A1c remains above goal at next visit (Ozempic through [employer] MAP) SGLT2i: Avoiding at this time per yeast infections on Jardiance TZD: Avoiding due to possible weight gain/increase in fracture risk. Insulin: Not unreasonable, though defer as last resort or as needed for severe hyperglycemia given risk hypoglycemia. Other safer options at this time.    2. ASCVD risk reduction: LDL at goal on last lipid panel with LDL 39 mg/dL, TG 161 mh/dL (0/9/60). LDL goal <55 mg/dL (secondary prevention).  Key risk  factors include: diabetes, former smoker, and sedentary lifestyle Current Regimen: rosuvastatin 40 mg daily Continue medications today without changes.    4. Healthcare Maintenance:  Pneumococcal - Current status: Up to date, last vaccine was after age 41.   Shingles - Current status: No record (received 1 dose Zostavax long ago); Due for Shingrix x2 Influenza - Current status: Up to date for 2024  Due to receive the following vaccines: Shingrix and Covid Booster   Follow Up PCP follow 3 months  Follow up with clinical pharmacist via phone 6 weeks    Future Appointments  Date Time Provider Department Center  02/18/2024  2:00 PM [facility] PHARMACIST [facility]  03/03/2024  1:40 PM [facility] ANNUAL WELLNESS VISIT [facility]  03/18/2024  2:00 PM [facility] LAB [facility]  03/20/2024  1:30 PM Dale Koppel, MD [facility]   Loree Fee, PharmD Clinical Pharmacist [facility] [phone]

## 2024-01-25 ENCOUNTER — Telehealth: Payer: Self-pay | Admitting: Internal Medicine

## 2024-01-25 NOTE — Telephone Encounter (Signed)
 Copied from CRM [id number]. Topic: Medicare AWV >> Jan 25, 2024 10:30 AM Payton Doughty wrote: Reason for CRM: LVM 01/25/24 to r/s AWV appt due to [facility] out of office. New AWV appt 04/07/2024 at 220pm. Please confirm date change khc  Verlee Rossetti; Care Guide Ambulatory Clinical Support [place] [facility] Direct Dial: [phone]

## 2024-02-18 ENCOUNTER — Other Ambulatory Visit: Payer: Medicare Other | Admitting: Pharmacist

## 2024-02-18 NOTE — Progress Notes (Unsigned)
 02/22/2024 Name: Jody Wang MRN: [id number] DOB: 1946-08-19  Subjective  No chief complaint on file.   Reason for visit: Jody Wang is a 78 y.o. year old adult who presented for a telephone visit.   They were referred to the pharmacist by their PCP for assistance in managing diabetes.   Care Team: Primary Care Provider: Dellar Fenton, MD  Reason for visit: ?  Jody Wang is a 78 y.o. adult with a history of diabetes (type 2), who presents today for a follow up diabetes pharmacotherapy visit.? Pertinent PMH also includes CAD/stable angina, hypothyroidism.   Known DM Complications: no known complications    Date of Last Diabetes Related Visit: 11/14/22 with PCP; 10/08/23 with Pharmacist  At Last Diabetes Related Visit: ?  2/24: *** 1/9: No BG readings. ?? metformin  XR 1000 mg BID 10/15: Start taking all metformin  XR tablets once daily to improve adherence. 2025 Re-enrollment for Trulicity  ([facility] MAP) 7/29: Patient did not increase metformin  as instructed, taking 1000 mg daily   Prescription drug coverage: Payor: [employer] MEDICARE / Plan: [employer] MEDICARE / Product Type: *No Product type* / . United HealthCare in 2025.  - Reports that all medications are affordable.  - Current Patient Assistance: Yes Trulicity  ([employer]) - Approved through 11/05/24.   Since Last visit / History of Present Illness: ?  Since last PCP visit 1/9, patient has resumed SMBG. Feels her sugars are improving. Has been focusing more on lowering her carb intake. Notes that her Son has moved out which she feels has helped her adhere to her diet (as he would bring home sweets/'junk food'). Now just lives with her sister and they try to cook at home most days.   Continues to report 2-3 metformin  tablets (500 mg each) per day. She states this depends on what time she wakes, and when she eats. Usually takes 1-2 tablets with lunch,  sometimes 1 tablet in the evening. Unclear reasoning  for continuing to split doses. No adverse effects at this time.    Reported DM Regimen: ?  Metformin  XR 500 mg (3-4 tablets per day) Trulicity  4.5 mg sq once weekly   DM medications tried in the past:?  Jardiance  (08/2018) x1 yeast infection treated, returned shortly thereafter, Jardiance  stopped)  SMBG Per BG meter (OneTouch Verio): ? Tries to check fasting BG and after dinner/before bed though has been out of test strips for a few weeks and has not been checking BG. Rx was sent, though patient reports she has not heard anything from the pharmacy. Reports receiving them usually "from [place] ". Denies receiving from [facility] Rx. Confirms brand: OneTouch Verio.  FBG: 148 this morning (had cereal before bed), 116, 126, 130 (once PP 186) After dinner: Cannot recall readings. Reports these sugars have been 'fine'   Hypo/Hyperglycemia: ?  Symptoms of hypoglycemia since last visit:? no  If yes, it was treated by: n/a  Symptoms of hyperglycemia since last visit:? no - none  Reported Diet:   Wakes ~11 am: Brunch: sausage/eggs/bread. Sometimes out to eat.  Lunch: skips Dinner (6-7 pm): Cooks (meat + vegetable). Sometimes potatoes or corn. Stopped rice since son moved out.  Snacks: before bed (Banana + peanut butter) Beverages: Coke zero, water  Exercise: (Double knee replacement, May/Oct) - Increasing walking as able (once it gets warmer, want to walk outside 3 times per week)  DM Prevention:  Statin: Taking; high intensity History of chronic kidney disease? no History of albuminuria? no, last UACR on 12/07/22 =  9.7 mg/g ACE/ARB - Not taking; Urine MA/CR Ratio - normal <30 mg/g.  Last eye exam: 01/09/2023; No retinopathy present - DUE Last foot exam: 03/15/2023 Tobacco Use: Former smoker; 23py (quit 1985) Immunizations:? Flu: Up to date (Last: 07/16/2023); Pneumococcal: PPSV23 (07/02/23) PCV13 (10/2015 after age 67); Shingrix: No Record - DUE; Covid (12/2019, 01/2020, 08/2020; No Booster  record)  Cardiovascular Risk Reduction History of clinical ASCVD? yes History of heart failure? no  History of hyperlipidemia? yes Current BMI: 25.1 kg/m2 (Ht 61 in, Wt 60.3 kg) Taking statin? yes; high intensity (rosuvastatin  40 mg daily) Taking aspirin? No    Taking SGLT-2i? no; Yeast infections on Jardiance  Taking GLP- 1 RA? yes    _______________________________________________  Objective    Review of Systems:?  Constitutional:? No fever, chills or unintentional weight loss  Cardiovascular:? No chest pain or pressure, shortness of breath, dyspnea on exertion, orthopnea or LE edema  Pulmonary:? No cough or shortness of breath  GI:? No nausea, vomiting, constipation, diarrhea, abdominal pain, dyspepsia, change in bowel habits  Endocrine:? No polyuria, polyphagia or blurred vision  Psych:? No depression, anxiety, insomnia   Physical Examination:  Vitals:  Wt Readings from Last 3 Encounters:  11/15/23 133 lb (60.3 kg)  07/16/23 136 lb (61.7 kg)  03/27/23 130 lb 12.8 oz (59.3 kg)   BP Readings from Last 3 Encounters:  11/15/23 112/70  07/16/23 118/72  03/27/23 116/74   Pulse Readings from Last 3 Encounters:  11/15/23 84  07/16/23 72  03/27/23 75     Labs:?  Lab Results  Component Value Date   HGBA1C 7.9 (H) 11/13/2023   HGBA1C 8.0 (H) 07/12/2023   HGBA1C 8.3 (H) 03/15/2023   GLUCOSE 145 (H) 11/13/2023   MICRALBCREAT 0.9 12/07/2022   MICRALBCREAT 1.1 07/08/2021   MICRALBCREAT 3.1 03/08/2021   CREATININE 0.81 11/13/2023   CREATININE 0.74 07/12/2023   CREATININE 0.80 03/15/2023   GFR 69.69 11/13/2023   GFR 77.86 07/12/2023   GFR 71.06 03/15/2023    Lab Results  Component Value Date   CHOL 144 11/13/2023   LDLCALC 75 11/13/2023   LDLCALC 39 07/12/2023   LDLCALC 41 03/15/2023   LDLDIRECT 188.8 06/30/2013   HDL 49.40 11/13/2023   HDL 47.70 07/12/2023   HDL 47.10 03/15/2023   AST 13 11/13/2023   AST 14 07/12/2023   ALT 12 11/13/2023   ALT 12 07/12/2023       Chemistry      Component Value Date/Time   NA 138 11/13/2023 1434   K 4.6 11/13/2023 1434   CL 101 11/13/2023 1434   CO2 26 11/13/2023 1434   BUN 17 11/13/2023 1434   CREATININE 0.81 11/13/2023 1434   CREATININE 0.71 11/05/2014 1237      Component Value Date/Time   CALCIUM  9.7 11/13/2023 1434   ALKPHOS 76 11/13/2023 1434   AST 13 11/13/2023 1434   ALT 12 11/13/2023 1434   BILITOT 0.4 11/13/2023 1434     The 10-year ASCVD risk score (Arnett DK, et al., 2019) is: 30.5%  Assessment and Plan:   1. Diabetes, type 2: uncontrolled per last A1c of 7.9% (11/13/23), relatively unchanged from previous 8.0%. Goal <7% without hypoglycemia. Since last A1c, patient reports focusing on carb intake (cut out rice, more mindful of carbs w dinner). Metformin  compliance remains variable (2-3 tabs/day). FBG from memory reported ~110s-140 mg/dL. Cannot recall sugars any other times of the day though reports they are "Normal" after dinner.   Current Regimen: Metformin  XR 1000 mg  BID (only taking 1000-1500 mg daily), Trulicity  4.5 mg weekly Emphasized consistency w metformin  - okay to take an hour or two late if sleeps in. If not eating, take with protein (such as spoon of peanut butter) Exercise: Patient set a goal today to start walking outside 3 days per week once the weather warms  Future Consideration: GLP1-RA: Consider transition to GLP1 with stronger A1c-lowering propensity if A1c remains above goal at next visit (Ozempic through [employer] MAP) SGLT2i: Avoiding at this time per yeast infections on Jardiance  TZD: Avoiding due to possible weight gain/increase in fracture risk. Insulin : Not unreasonable, though defer as last resort or as needed for severe hyperglycemia given risk hypoglycemia. Other safer options at this time.    2. ASCVD risk reduction: LDL  above goal  on last lipid panel with LDL 75 mg/dL, increased from 39 mg/dL. TG WNL at 97 mg/dL (0/9/81). LDL goal <55 mg/dL (secondary  prevention).  Key risk factors include: diabetes, former smoker, and sedentary lifestyle Current Regimen: rosuvastatin  40 mg daily Continue medications today without changes.    4. Healthcare Maintenance:  Pneumococcal - Current status: Up to date, last vaccine was after age 89.   Shingles - Current status: No record (received 1 dose Zostavax long ago); Due for Shingrix x2 Influenza - Current status: Up to date for 2024  Due to receive the following vaccines: Shingrix and Covid Booster   Follow Up PCP follow 1 month  Follow up with clinical pharmacist via phone ~2 months  Future Appointments  Date Time Provider Department Center  03/18/2024  2:00 PM [facility]  03/20/2024  1:30 PM Dellar Fenton, MD [facility]  04/07/2024  2:20 PM [facility] ANNUAL WELLNESS VISIT [facility]   Daron Ellen, PharmD Clinical Pharmacist [address] [facility] [phone]

## 2024-02-22 ENCOUNTER — Other Ambulatory Visit: Payer: Self-pay | Admitting: Internal Medicine

## 2024-02-22 DIAGNOSIS — E1165 Type 2 diabetes mellitus with hyperglycemia: Secondary | ICD-10-CM

## 2024-03-05 DIAGNOSIS — H6123 Impacted cerumen, bilateral: Secondary | ICD-10-CM | POA: Diagnosis not present

## 2024-03-05 DIAGNOSIS — H903 Sensorineural hearing loss, bilateral: Secondary | ICD-10-CM | POA: Diagnosis not present

## 2024-03-05 DIAGNOSIS — H90A32 Mixed conductive and sensorineural hearing loss, unilateral, left ear with restricted hearing on the contralateral side: Secondary | ICD-10-CM | POA: Diagnosis not present

## 2024-03-13 ENCOUNTER — Encounter (HOSPITAL_COMMUNITY): Payer: Self-pay

## 2024-03-18 ENCOUNTER — Other Ambulatory Visit: Payer: Self-pay

## 2024-03-18 ENCOUNTER — Other Ambulatory Visit (INDEPENDENT_AMBULATORY_CARE_PROVIDER_SITE_OTHER): Payer: Medicare Other

## 2024-03-18 DIAGNOSIS — D649 Anemia, unspecified: Secondary | ICD-10-CM

## 2024-03-18 DIAGNOSIS — E1165 Type 2 diabetes mellitus with hyperglycemia: Secondary | ICD-10-CM

## 2024-03-18 DIAGNOSIS — E7800 Pure hypercholesterolemia, unspecified: Secondary | ICD-10-CM

## 2024-03-18 DIAGNOSIS — E039 Hypothyroidism, unspecified: Secondary | ICD-10-CM | POA: Diagnosis not present

## 2024-03-18 LAB — HEPATIC FUNCTION PANEL
ALT: 9 U/L (ref 0–35)
AST: 11 U/L (ref 0–37)
Albumin: 4.3 g/dL (ref 3.5–5.2)
Alkaline Phosphatase: 67 U/L (ref 39–117)
Bilirubin, Direct: 0.1 mg/dL (ref 0.0–0.3)
Total Bilirubin: 0.4 mg/dL (ref 0.2–1.2)
Total Protein: 7 g/dL (ref 6.0–8.3)

## 2024-03-18 LAB — CBC WITH DIFFERENTIAL/PLATELET
Basophils Absolute: 0 10*3/uL (ref 0.0–0.1)
Basophils Relative: 0.3 % (ref 0.0–3.0)
Eosinophils Absolute: 0.4 10*3/uL (ref 0.0–0.7)
Eosinophils Relative: 5.1 % — ABNORMAL HIGH (ref 0.0–5.0)
HCT: 40 % (ref 36.0–46.0)
Hemoglobin: 13.4 g/dL (ref 12.0–15.0)
Lymphocytes Relative: 22.8 % (ref 12.0–46.0)
Lymphs Abs: 1.9 10*3/uL (ref 0.7–4.0)
MCHC: 33.4 g/dL (ref 30.0–36.0)
MCV: 89.5 fl (ref 78.0–100.0)
Monocytes Absolute: 0.6 10*3/uL (ref 0.1–1.0)
Monocytes Relative: 7.6 % (ref 3.0–12.0)
Neutro Abs: 5.4 10*3/uL (ref 1.4–7.7)
Neutrophils Relative %: 64.2 % (ref 43.0–77.0)
Platelets: 333 10*3/uL (ref 150.0–400.0)
RBC: 4.47 Mil/uL (ref 3.87–5.11)
RDW: 13.9 % (ref 11.5–15.5)
WBC: 8.4 10*3/uL (ref 4.0–10.5)

## 2024-03-18 LAB — LIPID PANEL
Cholesterol: 125 mg/dL (ref 0–200)
HDL: 49.2 mg/dL (ref >39.00)
LDL Cholesterol: 57 mg/dL (ref 0–99)
NonHDL: 76.08
Total CHOL/HDL Ratio: 3
Triglycerides: 97 mg/dL (ref 0.0–149.0)
VLDL: 19.4 mg/dL (ref 0.0–40.0)

## 2024-03-18 LAB — BASIC METABOLIC PANEL WITH GFR
BUN: 13 mg/dL (ref 6–23)
CO2: 25 meq/L (ref 19–32)
Calcium: 9.3 mg/dL (ref 8.4–10.5)
Chloride: 103 meq/L (ref 96–112)
Creatinine, Ser: 0.75 mg/dL (ref 0.40–1.20)
GFR: 76.24 mL/min (ref >60.00)
Glucose, Bld: 137 mg/dL — ABNORMAL HIGH (ref 70–99)
Potassium: 4.4 meq/L (ref 3.5–5.1)
Sodium: 139 meq/L (ref 135–145)

## 2024-03-18 LAB — TSH: TSH: 3.11 u[IU]/mL (ref 0.35–5.50)

## 2024-03-18 LAB — HEMOGLOBIN A1C: Hgb A1c MFr Bld: 7.5 % — ABNORMAL HIGH (ref 4.6–6.5)

## 2024-03-19 ENCOUNTER — Ambulatory Visit: Payer: Self-pay | Admitting: Internal Medicine

## 2024-03-20 ENCOUNTER — Ambulatory Visit: Payer: Medicare Other | Admitting: Internal Medicine

## 2024-03-20 ENCOUNTER — Encounter: Payer: Self-pay | Admitting: Internal Medicine

## 2024-03-20 VITALS — BP 128/72 | HR 83 | Temp 97.9°F | Resp 16 | Ht 61.0 in | Wt 133.6 lb

## 2024-03-20 DIAGNOSIS — J302 Other seasonal allergic rhinitis: Secondary | ICD-10-CM | POA: Diagnosis not present

## 2024-03-20 DIAGNOSIS — Z7985 Long-term (current) use of injectable non-insulin antidiabetic drugs: Secondary | ICD-10-CM

## 2024-03-20 DIAGNOSIS — I25118 Atherosclerotic heart disease of native coronary artery with other forms of angina pectoris: Secondary | ICD-10-CM

## 2024-03-20 DIAGNOSIS — D649 Anemia, unspecified: Secondary | ICD-10-CM

## 2024-03-20 DIAGNOSIS — F320 Major depressive disorder, single episode, mild: Secondary | ICD-10-CM

## 2024-03-20 DIAGNOSIS — I779 Disorder of arteries and arterioles, unspecified: Secondary | ICD-10-CM

## 2024-03-20 DIAGNOSIS — Z1231 Encounter for screening mammogram for malignant neoplasm of breast: Secondary | ICD-10-CM

## 2024-03-20 DIAGNOSIS — E039 Hypothyroidism, unspecified: Secondary | ICD-10-CM | POA: Diagnosis not present

## 2024-03-20 DIAGNOSIS — B351 Tinea unguium: Secondary | ICD-10-CM | POA: Diagnosis not present

## 2024-03-20 DIAGNOSIS — E1165 Type 2 diabetes mellitus with hyperglycemia: Secondary | ICD-10-CM | POA: Diagnosis not present

## 2024-03-20 DIAGNOSIS — Z0000 Encounter for general adult medical examination without abnormal findings: Secondary | ICD-10-CM

## 2024-03-20 DIAGNOSIS — Z7984 Long term (current) use of oral hypoglycemic drugs: Secondary | ICD-10-CM

## 2024-03-20 DIAGNOSIS — I700 Atherosclerosis of aorta: Secondary | ICD-10-CM

## 2024-03-20 DIAGNOSIS — E7800 Pure hypercholesterolemia, unspecified: Secondary | ICD-10-CM

## 2024-03-20 LAB — HM DIABETES FOOT EXAM

## 2024-03-20 MED ORDER — LEVOCETIRIZINE DIHYDROCHLORIDE 5 MG PO TABS: 5.0000 mg | ORAL_TABLET | Freq: Every day | ORAL | 3 refills | Status: AC

## 2024-03-20 MED ORDER — FLUOXETINE HCL 20 MG PO CAPS
20.0000 mg | ORAL_CAPSULE | Freq: Every day | ORAL | 1 refills | Status: DC
Start: 2024-03-20 — End: 2024-09-29

## 2024-03-20 MED ORDER — ROSUVASTATIN CALCIUM 40 MG PO TABS: 40.0000 mg | ORAL_TABLET | Freq: Every day | ORAL | 3 refills | Status: AC

## 2024-03-20 MED ORDER — TRAZODONE HCL 50 MG PO TABS: 50.0000 mg | ORAL_TABLET | Freq: Every day | ORAL | 1 refills | Status: DC

## 2024-03-20 NOTE — Assessment & Plan Note (Signed)
 Continues on trulicity  and metformin .   Recent a1c 7.4. Discussed low carb diet and exercise as tolerated. Brought in no sugar readings. Sugars as outlined. Continue diet and exercise. Follow met b and A1c

## 2024-03-20 NOTE — Progress Notes (Addendum)
 Subjective:    Patient ID: Jody Wang, adult    DOB: November 05, 1946, 78 y.o.   MRN: [id number]  Patient here for  Chief Complaint  Patient presents with   Annual Exam    HPI Here for a physical exam.  She is doing relatively well. Increased stress with family medical issues. Overall appears to be handling things relatively well. No chest pain or sob reported. No cough or congestion. No abdominal pain or bowel change reported. Desires not to have a mammogram. Is concerned regarding great toe nail. Discussed podiatry referral. Reports when she check blood sugars (which is not often) - sugars usually average 130-150. Scheduled for eye exam.    Past Medical History:  Diagnosis Date   AK (actinic keratosis)    Anemia    Aortic atherosclerosis (HCC)    Carotid artery disease (HCC)    Carotid bruit    Chest pain 01/28/2017   Complication of anesthesia    hard time waking up x 1 surgery   Depression    Diabetes mellitus without complication (HCC)    GERD (gastroesophageal reflux disease)    Heart murmur    Hypercholesterolemia    Hypothyroidism    Lung nodule, multiple    Osteoarthritis of knee    Shingles    Wears dentures    partial lower   Past Surgical History:  Procedure Laterality Date   ABDOMINAL HYSTERECTOMY  1985   ovaries not removed   BREAST CYST EXCISION Right    BREAST CYST EXCISION Left    CATARACT EXTRACTION W/PHACO Right 02/16/2016   Procedure: CATARACT EXTRACTION PHACO AND INTRAOCULAR LENS PLACEMENT (IOC) RIGHT ;  Surgeon: Annell Kidney, MD;  Location: [facility];  Service: Ophthalmology;  Laterality: Right;  DIABETIC - oral meds   CATARACT EXTRACTION W/PHACO Left 03/15/2016   Procedure: CATARACT EXTRACTION PHACO AND INTRAOCULAR LENS PLACEMENT (IOC)left eye;  Surgeon: Annell Kidney, MD;  Location: [facility];  Service: Ophthalmology;  Laterality: Left;  DIABETIC - oral meds   FOOT SURGERY Bilateral    INNER EAR SURGERY Left     KNEE ARTHROPLASTY Right 03/17/2022   Procedure: COMPUTER ASSISTED TOTAL KNEE ARTHROPLASTY;  Surgeon: Arlyne Lame, MD;  Location: [facility];  Service: Orthopedics;  Laterality: Right;   KNEE ARTHROPLASTY Left 08/23/2022   Procedure: COMPUTER ASSISTED TOTAL KNEE ARTHROPLASY;  Surgeon: Arlyne Lame, MD;  Location: [facility];  Service: Orthopedics;  Laterality: Left;   Family History  Problem Relation Age of Onset   Diabetes Father    Coronary artery disease Father        s/p CABG   Hypertension Father    Hypercholesterolemia Father    Hypertension Brother    Diabetes Brother    Hypertension Sister    Glaucoma Sister    Diabetes Other        niece   Breast cancer Maternal Aunt    Breast cancer Paternal Aunt    Cervical cancer Paternal Aunt    Breast cancer Cousin    Breast cancer Other    Social History   Socioeconomic History   Marital status: Widowed    Spouse name: Not on file   Number of children: 3   Years of education: Not on file   Highest education level: Some college, no degree  Occupational History   Not on file  Tobacco Use   Smoking status: Former    Current packs/day: 0.00    Average packs/day: 1 pack/day for 23.0  years (23.0 ttl pk-yrs)    Types: Cigarettes    Start date: 11/19/1960    Quit date: 11/20/1983    Years since quitting: 40.3   Smokeless tobacco: Never  Vaping Use   Vaping status: Never Used  Substance and Sexual Activity   Alcohol use: Yes    Comment: every monday   Drug use: No   Sexual activity: Never  Other Topics Concern   Not on file  Social History Narrative   Lives at home with son.    Social Drivers of Corporate investment banker Strain: Low Risk  (03/26/2023)   Overall Financial Resource Strain (CARDIA)    Difficulty of Paying Living Expenses: Not hard at all  Food Insecurity: No Food Insecurity (03/26/2023)   Hunger Vital Sign    Worried About Running Out of Food in the Last Year: Never true    Ran Out of Food in the  Last Year: Never true  Transportation Needs: No Transportation Needs (03/26/2023)   PRAPARE - Administrator, Civil Service (Medical): No    Lack of Transportation (Non-Medical): No  Physical Activity: Unknown (03/26/2023)   Exercise Vital Sign    Days of Exercise per Week: 0 days    Minutes of Exercise per Session: Not on file  Recent Concern: Physical Activity - Inactive (03/26/2023)   Exercise Vital Sign    Days of Exercise per Week: 0 days    Minutes of Exercise per Session: 20 min  Stress: No Stress Concern Present (03/26/2023)   [employer] of Occupational Health - Occupational Stress Questionnaire    Feeling of Stress : Not at all  Social Connections: Unknown (03/26/2023)   Social Connection and Isolation Panel [NHANES]    Frequency of Communication with Friends and Family: Three times a week    Frequency of Social Gatherings with Friends and Family: Three times a week    Attends Religious Services: More than 4 times per year    Active Member of Clubs or Organizations: Not on file    Attends Banker Meetings: Not on file    Marital Status: Widowed     Review of Systems  Constitutional:  Negative for appetite change and unexpected weight change.  HENT:  Negative for congestion, sinus pressure and sore throat.   Eyes:  Negative for pain and visual disturbance.  Respiratory:  Negative for cough, chest tightness and shortness of breath.   Cardiovascular:  Negative for chest pain, palpitations and leg swelling.  Gastrointestinal:  Negative for abdominal pain, diarrhea, nausea and vomiting.  Genitourinary:  Negative for difficulty urinating and dysuria.  Musculoskeletal:  Negative for joint swelling and myalgias.  Skin:  Negative for color change and rash.  Neurological:  Negative for dizziness and headaches.  Hematological:  Negative for adenopathy. Does not bruise/bleed easily.  Psychiatric/Behavioral:  Negative for agitation and dysphoric mood.         Objective:     BP 128/72   Pulse 83   Temp 97.9 F (36.6 C)   Resp 16   Ht 5\' 1"  (1.549 m)   Wt 133 lb 9.6 oz (60.6 kg)   SpO2 98%   BMI 25.24 kg/m  Wt Readings from Last 3 Encounters:  03/20/24 133 lb 9.6 oz (60.6 kg)  11/15/23 133 lb (60.3 kg)  07/16/23 136 lb (61.7 kg)    Physical Exam Vitals reviewed.  Constitutional:      General: He is not in acute distress.  Appearance: Normal appearance. He is well-developed.  HENT:     Head: Normocephalic and atraumatic.     Right Ear: External ear normal.     Left Ear: External ear normal.     Mouth/Throat:     Pharynx: No oropharyngeal exudate or posterior oropharyngeal erythema.  Eyes:     General: No scleral icterus.       Right eye: No discharge.        Left eye: No discharge.     Conjunctiva/sclera: Conjunctivae normal.  Neck:     Thyroid : No thyromegaly.  Cardiovascular:     Rate and Rhythm: Normal rate and regular rhythm.  Pulmonary:     Effort: No tachypnea, accessory muscle usage or respiratory distress.     Breath sounds: Normal breath sounds. No decreased breath sounds or wheezing.  Chest:  Breasts:    Right: No inverted nipple, mass, nipple discharge or tenderness (no axillary adenopathy).     Left: No inverted nipple, mass, nipple discharge or tenderness (no axilarry adenopathy).  Abdominal:     General: Bowel sounds are normal.     Palpations: Abdomen is soft.     Tenderness: There is no abdominal tenderness.  Musculoskeletal:        General: No swelling or tenderness.     Cervical back: Neck supple.  Lymphadenopathy:     Cervical: No cervical adenopathy.  Skin:    Findings: No erythema or rash.     Comments: Thickening - great toe nail.   Neurological:     Mental Status: He is alert and oriented to person, place, and time.  Psychiatric:        Mood and Affect: Mood normal.        Behavior: Behavior normal.      Diabetic foot exam was performed with the following findings:   No  deformities, ulcerations, or other skin breakdown Normal sensation of 10g monofilament Intact posterior tibialis and dorsalis pedis pulses      Outpatient Encounter Medications as of 03/20/2024  Medication Sig   acetaminophen  (TYLENOL ) 325 MG tablet Take 650 mg by mouth every 6 (six) hours as needed.   celecoxib  (CELEBREX ) 200 MG capsule Take 1 capsule (200 mg total) by mouth 2 (two) times daily.   cholecalciferol  (VITAMIN D ) 1000 units tablet Take 1,000 Units by mouth daily.   Dulaglutide  (TRULICITY ) 4.5 MG/0.5ML SOAJ Inject 4.5 mg as directed once a week.   ferrous sulfate  325 (65 FE) MG EC tablet Take 325 mg by mouth daily.   FLUoxetine  (PROZAC ) 20 MG capsule Take 1 capsule (20 mg total) by mouth daily.   fluticasone  (FLONASE ) 50 MCG/ACT nasal spray Use 2 sprays in both nostrils daily   glucose blood test strip Use as instructed to check blood sugars to check blood sugars twice daily. Dx E11.9   glucose blood test strip Use as instructed to check blood sugar twice daily. Dx E11.9   levocetirizine (XYZAL ) 5 MG tablet Take 1 tablet (5 mg total) by mouth daily.   levothyroxine  (SYNTHROID ) 88 MCG tablet Take 1 tablet (88 mcg total) by mouth daily.   metFORMIN  (GLUCOPHAGE -XR) 500 MG 24 hr tablet TAKE 2 TABLETS BY MOUTH TWICE  DAILY   pantoprazole  (PROTONIX ) 40 MG tablet Take 1 tablet (40 mg total) by mouth daily.   rosuvastatin  (CRESTOR ) 40 MG tablet Take 1 tablet (40 mg total) by mouth daily.   traZODone  (DESYREL ) 50 MG tablet Take 1 tablet (50 mg total) by mouth daily.  vitamin B-12 (CYANOCOBALAMIN ) 1000 MCG tablet Take 1,000 mcg by mouth daily.   [DISCONTINUED] FLUoxetine  (PROZAC ) 20 MG capsule Take 1 capsule (20 mg total) by mouth daily.   [DISCONTINUED] levocetirizine (XYZAL ) 5 MG tablet Take 1 tablet (5 mg total) by mouth daily.   [DISCONTINUED] rosuvastatin  (CRESTOR ) 40 MG tablet Take 1 tablet (40 mg total) by mouth daily.   [DISCONTINUED] traZODone  (DESYREL ) 50 MG tablet Take 1  tablet (50 mg total) by mouth daily.   No facility-administered encounter medications on file as of 03/20/2024.     Lab Results  Component Value Date   WBC 8.4 03/18/2024   HGB 13.4 03/18/2024   HCT 40.0 03/18/2024   PLT 333.0 03/18/2024   GLUCOSE 137 (H) 03/18/2024   CHOL 125 03/18/2024   TRIG 97.0 03/18/2024   HDL 49.20 03/18/2024   LDLDIRECT 188.8 06/30/2013   LDLCALC 57 03/18/2024   ALT 9 03/18/2024   AST 11 03/18/2024   NA 139 03/18/2024   K 4.4 03/18/2024   CL 103 03/18/2024   CREATININE 0.75 03/18/2024   BUN 13 03/18/2024   CO2 25 03/18/2024   TSH 3.11 03/18/2024   HGBA1C 7.5 (H) 03/18/2024   MICROALBUR 1.1 12/07/2022    MM 3D SCREEN BREAST BILATERAL Result Date: 12/28/2022 CLINICAL DATA:  Screening. EXAM: DIGITAL SCREENING BILATERAL MAMMOGRAM WITH TOMOSYNTHESIS AND CAD TECHNIQUE: Bilateral screening digital craniocaudal and mediolateral oblique mammograms were obtained. Bilateral screening digital breast tomosynthesis was performed. The images were evaluated with computer-aided detection. COMPARISON:  Previous exam(s). ACR Breast Density Category b: There are scattered areas of fibroglandular density. FINDINGS: There are no findings suspicious for malignancy. IMPRESSION: No mammographic evidence of malignancy. A result letter of this screening mammogram will be mailed directly to the patient. RECOMMENDATION: Screening mammogram in one year. (Code:[id number]-B-01Y) BI-RADS CATEGORY  1: Negative. Electronically Signed   By: Sundra Engel M.D.   On: 12/28/2022 17:39       Assessment & Plan:  Routine general medical examination at a health care facility  Seasonal allergies -     Levocetirizine Dihydrochloride ; Take 1 tablet (5 mg total) by mouth daily.  Dispense: 90 tablet; Refill: 3  Hypercholesterolemia Assessment & Plan: Continue crestor .  Low cholesterol diet and exercise. Schedule f/u lipid and liver panel.  Lab Results  Component Value Date   CHOL 125 03/18/2024   HDL  49.20 03/18/2024   LDLCALC 57 03/18/2024   LDLDIRECT 188.8 06/30/2013   TRIG 97.0 03/18/2024   CHOLHDL 3 03/18/2024     Orders: -     Lipid panel; Future -     Basic metabolic panel with GFR; Future -     Hepatic function panel; Future  Type 2 diabetes mellitus with hyperglycemia, without long-term current use of insulin  (HCC) Assessment & Plan: Continues on trulicity  and metformin .   Recent a1c 7.4. Discussed low carb diet and exercise as tolerated. Brought in no sugar readings. Sugars as outlined. Continue diet and exercise. Follow met b and A1c   Orders: -     Hemoglobin A1c; Future -     Microalbumin / creatinine urine ratio; Future -     Ambulatory referral to Podiatry  Encounter for screening mammogram for malignant neoplasm of breast  Health care maintenance Assessment & Plan: Physical today 03/20/24.  cologuard 08/2022 - negative.  Mammogram - 12/28/22 - birads I.  Declines f/u mammogram.    Anemia, unspecified type Assessment & Plan: Recent hgb wnl    Aortic atherosclerosis (HCC) Assessment &  Plan: Continue crestor .    Bilateral carotid artery disease, unspecified type (HCC) Assessment & Plan: Previous left carotid 60-79% and right 40-50%.  Continue crestor .  Needs f/u with [facility] to monitor. Discussed again with her today. She declines.  Continue risk factor modification.    Coronary artery disease of native artery of native heart with stable angina pectoris [facility]) Assessment & Plan: Previously seen on chest CT.  Saw Dr Gollan.  No chest pain.  Continue risk factor modification.  Continue crestor     Major depressive disorder, single episode, mild (HCC) Assessment & Plan: Overall appears to be doing well and handling stress.  Continue prozac .  Stable. No changes today.    Hypothyroidism, unspecified type Assessment & Plan: On thyroid  replacement. Follow tsh.    Toenail fungus Assessment & Plan: Request referral to podiatry.   Orders: -     Ambulatory  referral to Podiatry  Other orders -     FLUoxetine  HCl; Take 1 capsule (20 mg total) by mouth daily.  Dispense: 90 capsule; Refill: 1 -     Rosuvastatin  Calcium ; Take 1 tablet (40 mg total) by mouth daily.  Dispense: 100 tablet; Refill: 3 -     traZODone  HCl; Take 1 tablet (50 mg total) by mouth daily.  Dispense: 90 tablet; Refill: 1     Dellar Fenton, MD

## 2024-03-20 NOTE — Assessment & Plan Note (Addendum)
 Physical today 03/20/24.  cologuard 08/2022 - negative.  Mammogram - 12/28/22 - birads I.  Declines f/u mammogram.

## 2024-03-20 NOTE — Assessment & Plan Note (Signed)
 On thyroid replacement.  Follow tsh.

## 2024-03-20 NOTE — Assessment & Plan Note (Signed)
 Overall appears to be doing well and handling stress.  Continue prozac .  Stable. No changes today.

## 2024-03-20 NOTE — Assessment & Plan Note (Signed)
 Continue crestor

## 2024-03-20 NOTE — Assessment & Plan Note (Signed)
Recent hgb wnl.  

## 2024-03-20 NOTE — Assessment & Plan Note (Signed)
 Previous left carotid 60-79% and right 40-50%.  Continue crestor .  Needs f/u with [facility] to monitor. Discussed again with her today. She declines.  Continue risk factor modification.

## 2024-03-20 NOTE — Assessment & Plan Note (Signed)
Previously seen on chest CT.  Saw Dr Mariah Milling.  No chest pain.  Continue risk factor modification.  Continue crestor.

## 2024-03-20 NOTE — Assessment & Plan Note (Signed)
 Continue crestor .  Low cholesterol diet and exercise. Schedule f/u lipid and liver panel.  Lab Results  Component Value Date   CHOL 125 03/18/2024   HDL 49.20 03/18/2024   LDLCALC 57 03/18/2024   LDLDIRECT 188.8 06/30/2013   TRIG 97.0 03/18/2024   CHOLHDL 3 03/18/2024

## 2024-03-23 DIAGNOSIS — B351 Tinea unguium: Secondary | ICD-10-CM | POA: Insufficient documentation

## 2024-03-23 NOTE — Addendum Note (Signed)
 Addended by: Raejean Bullock on: 03/23/2024 12:01 PM   Modules accepted: Orders

## 2024-03-23 NOTE — Assessment & Plan Note (Signed)
Request referral to podiatry.  

## 2024-03-27 ENCOUNTER — Telehealth: Payer: Self-pay

## 2024-03-27 NOTE — Telephone Encounter (Signed)
 I think we may have discussed this at appt but they need it documented to schedule new patient appointment.

## 2024-03-27 NOTE — Telephone Encounter (Signed)
 Copied from CRM [id number]. Topic: General - Other >> Mar 27, 2024 11:51 AM Howard Macho wrote: Reason for CRM: patient called stating the provider told her that she would take on her son as a new patient because she is a patient. I could not make the appointment because per [facility] MD Geralyn Knee is not taking on new patients at this time CB [phone]

## 2024-03-27 NOTE — Telephone Encounter (Signed)
 Ok to schedule her son Jody Wang.

## 2024-03-28 NOTE — Telephone Encounter (Signed)
 Left message on patient's voice mail to have son call and schedule new patient appointment with Dr Geralyn Knee.

## 2024-04-07 ENCOUNTER — Ambulatory Visit (INDEPENDENT_AMBULATORY_CARE_PROVIDER_SITE_OTHER): Admitting: *Deleted

## 2024-04-07 ENCOUNTER — Telehealth: Payer: Self-pay | Admitting: *Deleted

## 2024-04-07 VITALS — Ht 61.0 in | Wt 132.0 lb

## 2024-04-07 DIAGNOSIS — Z0000 Encounter for general adult medical examination without abnormal findings: Secondary | ICD-10-CM | POA: Diagnosis not present

## 2024-04-07 NOTE — Patient Instructions (Signed)
 Jody Wang , Thank you for taking time out of your busy schedule to complete your Annual Wellness Visit with me. I enjoyed our conversation and look forward to speaking with you again next year. I, as well as your care team,  appreciate your ongoing commitment to your health goals. Please review the following plan we discussed and let me know if I can assist you in the future. Your Game plan/ To Do List    Referrals: If you haven't heard from the office you've been referred to, please reach out to them at the phone provided.  Remember to update your tetanus (Tdap) and shingles vaccines.  Follow up Visits: Next Medicare AWV with our clinical staff: 04/13/25 @ 2:20   Have you seen your provider in the last 6 months (3 months if uncontrolled diabetes)? Yes Next Office Visit with your provider: 07/25/24  Clinician Recommendations:  Aim for 30 minutes of exercise or brisk walking, 6-8 glasses of water, and 5 servings of fruits and vegetables each day.       This is a list of the screening recommended for you and due dates:  Health Maintenance  Topic Date Due   Screening for Lung Cancer  08/22/2022   Yearly kidney health urinalysis for diabetes  12/08/2023   Eye exam for diabetics  01/09/2024   COVID-19 Vaccine (5 - Pfizer risk 2024-25 season) 01/15/2024   Zoster (Shingles) Vaccine (1 of 2) 06/20/2024*   Flu Shot  06/06/2024   Hemoglobin A1C  09/18/2024   Yearly kidney function blood test for diabetes  03/18/2025   Complete foot exam   03/20/2025   Medicare Annual Wellness Visit  04/07/2025   Pneumonia Vaccine  Completed   DEXA scan (bone density measurement)  Completed   Hepatitis C Screening  Completed   HPV Vaccine  Aged Out   Meningitis B Vaccine  Aged Out   DTaP/Tdap/Td vaccine  Discontinued   Cologuard (Stool DNA test)  Discontinued  *Topic was postponed. The date shown is not the original due date.    Advanced directives: (Declined) Advance directive discussed with you today.  Even though you declined this today, please call our office should you change your mind, and we can give you the proper paperwork for you to fill out. Advance Care Planning is important because it:  [x]  Makes sure you receive the medical care that is consistent with your values, goals, and preferences  [x]  It provides guidance to your family and loved ones and reduces their decisional burden about whether or not they are making the right decisions based on your wishes.

## 2024-04-07 NOTE — Telephone Encounter (Signed)
 Performed AWV today. While reviewing care gaps the records show that patient is overdue a Lung cancer screening. Patient had her last screening 08/22/21. Patient stated that she quit smoking 40 years ago.  Please review the previous screening to determine if this needs to be ordered.

## 2024-04-07 NOTE — Progress Notes (Signed)
 Subjective:   Jody Wang is a 78 y.o. who presents for a Medicare Wellness preventive visit.  As a reminder, Annual Wellness Visits don't include a physical exam, and some assessments may be limited, especially if this visit is performed virtually. We may recommend an in-person follow-up visit with your provider if needed.  Visit Complete: Virtual I connected with  Jody Wang on 04/07/24 by a audio enabled telemedicine application and verified that I am speaking with the correct person using two identifiers.  Patient Location: Home  Provider Location: Home Office  I discussed the limitations of evaluation and management by telemedicine. The patient expressed understanding and agreed to proceed.  Vital Signs: Because this visit was a virtual/telehealth visit, some criteria may be missing or patient reported. Any vitals not documented were not able to be obtained and vitals that have been documented are patient reported.  VideoDeclined- This patient declined Librarian, academic. Therefore the visit was completed with audio only.  Persons Participating in Visit: Patient.  AWV Questionnaire: No: Patient Medicare AWV questionnaire was not completed prior to this visit.  Cardiac Risk Factors include: advanced age (>50men, >16 women);diabetes mellitus;dyslipidemia     Objective:     Today's Vitals   04/07/24 1414  Weight: 132 lb (59.9 kg)  Height: 5\' 1"  (1.549 m)   Body mass index is 24.94 kg/m.     04/07/2024    2:31 PM 03/01/2023    2:36 PM 08/23/2022    6:19 AM 08/11/2022    2:36 PM 03/17/2022    1:00 PM 03/17/2022    6:35 AM 03/06/2022   11:15 AM  Advanced Directives  Does Patient Have a Medical Advance Directive? No Yes Yes Yes No Yes Yes  Type of Special educational needs teacher of [place];Living will Healthcare Power of [place];Living will Healthcare Power of [employer] Power of Attorney   Does patient want to make  changes to medical advance directive?  No - Patient declined No - Patient declined No - Patient declined  No - Patient declined   Copy of Healthcare Power of Attorney in Chart?  No - copy requested Yes - validated most recent copy scanned in chart (See row information)   No - copy requested   Would patient like information on creating a medical advance directive? No - Patient declined  No - Patient declined No - Patient declined No - Patient declined      Current Medications (verified) Outpatient Encounter Medications as of 04/07/2024  Medication Sig   acetaminophen  (TYLENOL ) 325 MG tablet Take 650 mg by mouth every 6 (six) hours as needed.   celecoxib  (CELEBREX ) 200 MG capsule Take 1 capsule (200 mg total) by mouth 2 (two) times daily.   cholecalciferol  (VITAMIN D ) 1000 units tablet Take 1,000 Units by mouth daily.   Dulaglutide  (TRULICITY ) 4.5 MG/0.5ML SOAJ Inject 4.5 mg as directed once a week.   ferrous sulfate  325 (65 FE) MG EC tablet Take 325 mg by mouth daily.   FLUoxetine  (PROZAC ) 20 MG capsule Take 1 capsule (20 mg total) by mouth daily.   fluticasone  (FLONASE ) 50 MCG/ACT nasal spray Use 2 sprays in both nostrils daily   glucose blood test strip Use as instructed to check blood sugars to check blood sugars twice daily. Dx E11.9   glucose blood test strip Use as instructed to check blood sugar twice daily. Dx E11.9   levocetirizine (XYZAL ) 5 MG tablet Take 1 tablet (5 mg total)  by mouth daily.   levothyroxine  (SYNTHROID ) 88 MCG tablet Take 1 tablet (88 mcg total) by mouth daily.   metFORMIN  (GLUCOPHAGE -XR) 500 MG 24 hr tablet TAKE 2 TABLETS BY MOUTH TWICE  DAILY   pantoprazole  (PROTONIX ) 40 MG tablet Take 1 tablet (40 mg total) by mouth daily.   rosuvastatin  (CRESTOR ) 40 MG tablet Take 1 tablet (40 mg total) by mouth daily.   traZODone  (DESYREL ) 50 MG tablet Take 1 tablet (50 mg total) by mouth daily.   vitamin B-12 (CYANOCOBALAMIN ) 1000 MCG tablet Take 1,000 mcg by mouth daily.   No  facility-administered encounter medications on file as of 04/07/2024.    Allergies (verified) Codeine, Meperidine and related, Oxycontin  [oxycodone  hcl], Ezetimibe, Silicone, Jardiance  [empagliflozin ], and Tape   History: Past Medical History:  Diagnosis Date   AK (actinic keratosis)    Anemia    Aortic atherosclerosis (HCC)    Carotid artery disease (HCC)    Carotid bruit    Chest pain 01/28/2017   Complication of anesthesia    hard time waking up x 1 surgery   Depression    Diabetes mellitus without complication (HCC)    GERD (gastroesophageal reflux disease)    Heart murmur    Hypercholesterolemia    Hypothyroidism    Lung nodule, multiple    Osteoarthritis of knee    Shingles    Wears dentures    partial lower   Past Surgical History:  Procedure Laterality Date   ABDOMINAL HYSTERECTOMY  1985   ovaries not removed   BREAST CYST EXCISION Right    BREAST CYST EXCISION Left    CATARACT EXTRACTION W/PHACO Right 02/16/2016   Procedure: CATARACT EXTRACTION PHACO AND INTRAOCULAR LENS PLACEMENT (IOC) RIGHT ;  Surgeon: Annell Kidney, MD;  Location: [facility];  Service: Ophthalmology;  Laterality: Right;  DIABETIC - oral meds   CATARACT EXTRACTION W/PHACO Left 03/15/2016   Procedure: CATARACT EXTRACTION PHACO AND INTRAOCULAR LENS PLACEMENT (IOC)left eye;  Surgeon: Annell Kidney, MD;  Location: [facility];  Service: Ophthalmology;  Laterality: Left;  DIABETIC - oral meds   FOOT SURGERY Bilateral    INNER EAR SURGERY Left    KNEE ARTHROPLASTY Right 03/17/2022   Procedure: COMPUTER ASSISTED TOTAL KNEE ARTHROPLASTY;  Surgeon: Arlyne Lame, MD;  Location: [facility];  Service: Orthopedics;  Laterality: Right;   KNEE ARTHROPLASTY Left 08/23/2022   Procedure: COMPUTER ASSISTED TOTAL KNEE ARTHROPLASY;  Surgeon: Arlyne Lame, MD;  Location: [facility];  Service: Orthopedics;  Laterality: Left;   Family History  Problem Relation Age of Onset   Diabetes  Father    Coronary artery disease Father        s/p CABG   Hypertension Father    Hypercholesterolemia Father    Hypertension Brother    Diabetes Brother    Hypertension Sister    Glaucoma Sister    Diabetes Other        niece   Breast cancer Maternal Aunt    Breast cancer Paternal Aunt    Cervical cancer Paternal Aunt    Breast cancer Cousin    Breast cancer Other    Social History   Socioeconomic History   Marital status: Widowed    Spouse name: Not on file   Number of children: 3   Years of education: Not on file   Highest education level: Some college, no degree  Occupational History   Not on file  Tobacco Use   Smoking status: Former    Current  packs/day: 0.00    Average packs/day: 1 pack/day for 23.0 years (23.0 ttl pk-yrs)    Types: Cigarettes    Start date: 11/19/1960    Quit date: 11/20/1983    Years since quitting: 40.4   Smokeless tobacco: Never  Vaping Use   Vaping status: Never Used  Substance and Sexual Activity   Alcohol use: Yes    Comment: every monday   Drug use: No   Sexual activity: Never  Other Topics Concern   Not on file  Social History Narrative   Lives at home with son.    Social Drivers of Corporate investment banker Strain: Low Risk  (04/07/2024)   Overall Financial Resource Strain (CARDIA)    Difficulty of Paying Living Expenses: Not hard at all  Food Insecurity: No Food Insecurity (04/07/2024)   Hunger Vital Sign    Worried About Running Out of Food in the Last Year: Never true    Ran Out of Food in the Last Year: Never true  Transportation Needs: No Transportation Needs (04/07/2024)   PRAPARE - Administrator, Civil Service (Medical): No    Lack of Transportation (Non-Medical): No  Physical Activity: Sufficiently Active (04/07/2024)   Exercise Vital Sign    Days of Exercise per Week: 5 days    Minutes of Exercise per Session: 30 min  Stress: No Stress Concern Present (04/07/2024)   [employer] of Occupational Health  - Occupational Stress Questionnaire    Feeling of Stress : Only a little  Social Connections: Moderately Integrated (04/07/2024)   Social Connection and Isolation Panel [NHANES]    Frequency of Communication with Friends and Family: More than three times a week    Frequency of Social Gatherings with Friends and Family: More than three times a week    Attends Religious Services: More than 4 times per year    Active Member of [employer] or Organizations: Yes    Attends Banker Meetings: More than 4 times per year    Marital Status: Widowed    Tobacco Counseling Counseling given: Not Answered    Clinical Intake:  Pre-visit preparation completed: Yes  Pain : No/denies pain     BMI - recorded: 24.94 Nutritional Status: BMI of 19-24  Normal Nutritional Risks: None Diabetes: Yes CBG done?: No Did pt. bring in CBG monitor from home?: No  Lab Results  Component Value Date   HGBA1C 7.5 (H) 03/18/2024   HGBA1C 7.9 (H) 11/13/2023   HGBA1C 8.0 (H) 07/12/2023     How often do you need to have someone help you when you read instructions, pamphlets, or other written materials from your doctor or pharmacy?: 1 - Never  Interpreter Needed?: No  Information entered by :: R. Arilynn Blakeney LPN   Activities of Daily Living     04/07/2024    2:16 PM  In your present state of health, do you have any difficulty performing the following activities:  Hearing? 0  Comment wears one aid  Vision? 0  Comment readers  Difficulty concentrating or making decisions? 0  Walking or climbing stairs? 0  Dressing or bathing? 0  Doing errands, shopping? 0  Preparing Food and eating ? N  Using the Toilet? N  In the past six months, have you accidently leaked urine? N  Do you have problems with loss of bowel control? N  Managing your Medications? N  Managing your Finances? N  Housekeeping or managing your Housekeeping? N    Patient  Care Team: Dellar Fenton, MD as PCP - General (Internal  Medicine) Devorah Fonder, MD as Consulting Physician (Cardiology) Daron Ellen, [facility] as Pharmacist (Pharmacist)  I have updated your Care Teams any recent Medical Services you may have received from other providers in the past year.     Assessment:    This is a routine wellness examination for [place].  Hearing/Vision screen Hearing Screening - Comments:: Wears one aid Vision Screening - Comments:: readers   Goals Addressed             This Visit's Progress    Patient Stated       Wants to be able to walk better in the morning       Depression Screen     04/07/2024    2:23 PM 03/20/2024    1:28 PM 03/27/2023    1:49 PM 03/15/2023    3:56 PM 03/01/2023    2:55 PM 11/13/2022    1:11 PM 02/14/2022    1:37 PM  PHQ 2/9 Scores  PHQ - 2 Score 0 0 0 0 0 0 0  PHQ- 9 Score 0 2 0 0  0     Fall Risk     04/07/2024    2:18 PM 03/27/2023    1:48 PM 03/01/2023    2:49 PM 11/13/2022    1:11 PM 02/14/2022    1:36 PM  Fall Risk   Falls in the past year? 1 0 0 0 0  Number falls in past yr: 1 0 0 0 0  Injury with Fall? 0 0 0 0   Risk for fall due to : History of fall(s);Impaired balance/gait No Fall Risks  Impaired balance/gait;History of fall(s)   Follow up Falls evaluation completed;Falls prevention discussed Falls evaluation completed Falls evaluation completed;Falls prevention discussed Falls evaluation completed Falls evaluation completed    MEDICARE RISK AT HOME:  Medicare Risk at Home Any stairs in or around the home?: No If so, are there any without handrails?: No Home free of loose throw rugs in walkways, pet beds, electrical cords, etc?: Yes Adequate lighting in your home to reduce risk of falls?: Yes Life alert?: No Use of a cane, walker or w/c?: No Grab bars in the bathroom?: Yes Shower chair or bench in shower?: No Elevated toilet seat or a handicapped toilet?: No  TIMED UP AND GO:  Was the test performed?  No  Cognitive Function: 6CIT completed    10/04/2020     5:01 PM 11/30/2015    4:37 PM  MMSE - Mini Mental State Exam  Orientation to time 3 5  Orientation to Place 5 5  Registration 3 3  Attention/ Calculation 5 5  Recall 3 3  Language- name 2 objects 2 2  Language- repeat 1 1  Language- follow 3 step command 2 3  Language- read & follow direction 1 1  Write a sentence 1 1  Copy design 1 1  Total score 27 30        04/07/2024    2:31 PM 03/01/2023    2:55 PM 02/11/2021    1:34 PM 02/11/2020    1:36 PM 02/10/2019   12:15 PM  6CIT Screen  What Year? 0 points 0 points 0 points 0 points 0 points  What month? 0 points 0 points 0 points 0 points 0 points  What time? 0 points 0 points 0 points 0 points 0 points  Count back from 20 0 points 0 points 0 points  0 points 0 points  Months in reverse 0 points 0 points 0 points 0 points 0 points  Repeat phrase 2 points 0 points 0 points 0 points 0 points  Total Score 2 points 0 points 0 points 0 points 0 points    Immunizations Immunization History  Administered Date(s) Administered   Fluad Quad(high Dose 65+) 07/12/2021   Fluad Trivalent(High Dose 65+) 07/16/2023   Influenza Split 08/24/2013, 08/20/2014   Influenza, High Dose Seasonal PF 09/25/2016, 07/11/2018, 08/26/2019, 08/16/2020   Influenza,inj,Quad PF,6+ Mos 07/09/2015   Influenza-Unspecified 08/10/2017   PFIZER(Purple Top)SARS-COV-2 Vaccination 01/02/2020, 01/28/2020, 08/06/2020   Pneumococcal Conjugate-13 10/08/2015   Pneumococcal Polysaccharide-23 07/01/2013   RSV,unspecified 07/18/2023   Unspecified SARS-COV-2 Vaccination 07/18/2023   Zoster, Live 08/24/2013    Screening Tests Health Maintenance  Topic Date Due   Lung Cancer Screening  08/22/2022   Diabetic kidney evaluation - Urine ACR  12/08/2023   OPHTHALMOLOGY EXAM  01/09/2024   COVID-19 Vaccine (5 - Pfizer risk 2024-25 season) 01/15/2024   Medicare Annual Wellness (AWV)  02/29/2024   Zoster Vaccines- Shingrix (1 of 2) 06/20/2024 (Originally 10/08/1965)   INFLUENZA  VACCINE  06/06/2024   HEMOGLOBIN A1C  09/18/2024   Diabetic kidney evaluation - eGFR measurement  03/18/2025   FOOT EXAM  03/20/2025   Pneumonia Vaccine 8+ Years old  Completed   DEXA SCAN  Completed   Hepatitis C Screening  Completed   HPV VACCINES  Aged Out   Meningococcal B Vaccine  Aged Out   DTaP/Tdap/Td  Discontinued   Fecal DNA (Cologuard)  Discontinued    Health Maintenance  Health Maintenance Due  Topic Date Due   Lung Cancer Screening  08/22/2022   Diabetic kidney evaluation - Urine ACR  12/08/2023   OPHTHALMOLOGY EXAM  01/09/2024   COVID-19 Vaccine (5 - Pfizer risk 2024-25 season) 01/15/2024   Medicare Annual Wellness (AWV)  02/29/2024   Health Maintenance Items Addressed: Discussed the need to update shingles and tetanus (Tdap) vaccines. Patient declines Dexa/bone density.  Patient quit smoking 40 years ago.   Additional Screening:  Vision Screening: Recommended annual ophthalmology exams for early detection of glaucoma and other disorders of the eye. Not up to date Has an appointment scheduled 05/05/24 [facility] Would you like a referral to an eye doctor? No    Dental Screening: Recommended annual dental exams for proper oral hygiene  Community Resource Referral / Chronic Care Management: CRR required this visit?  No   CCM required this visit?  No   Plan:    I have personally reviewed and noted the following in the patient's chart:   Medical and social history Use of alcohol, tobacco or illicit drugs  Current medications and supplements including opioid prescriptions. Patient is not currently taking opioid prescriptions. Functional ability and status Nutritional status Physical activity Advanced directives List of other physicians Hospitalizations, surgeries, and ER visits in previous 12 months Vitals Screenings to include cognitive, depression, and falls Referrals and appointments  In addition, I have reviewed and discussed with patient  certain preventive protocols, quality metrics, and best practice recommendations. A written personalized care plan for preventive services as well as general preventive health recommendations were provided to patient.   Felicitas Horse, LPN   05/14/4695   After Visit Summary: (Pick Up) Due to this being a telephonic visit, with patients personalized plan was offered to patient and patient has requested to Pick up at office.  Notes: Nothing significant to report at this time. Phone note sent  to PCP

## 2024-04-08 NOTE — Telephone Encounter (Signed)
 Pt declined at this time and says she will discuss at her next appt with Dr Geralyn Knee.

## 2024-05-05 DIAGNOSIS — E119 Type 2 diabetes mellitus without complications: Secondary | ICD-10-CM | POA: Diagnosis not present

## 2024-05-13 ENCOUNTER — Other Ambulatory Visit: Payer: Self-pay

## 2024-05-13 NOTE — Addendum Note (Signed)
 Addended by: Agape Hardiman on: 05/13/2024 10:35 AM   Modules accepted: Orders

## 2024-05-13 NOTE — Telephone Encounter (Signed)
 Copied from CRM [id number]. Topic: Clinical - Order For Equipment >> May 12, 2024  4:10 PM Avram MATSU wrote: Reason for CRM: patient stated she got a letter in the mail about her one touch device. It wont be covered after 06/08/24 and patient would like to request a different one that will be covered by her insurance.   List that is covered  Contour plus blue meter, contour next generation meter. Contour next one meter, contour next ez meter, contour plus test strips,contour next test strips, accu check guide meter and test strips

## 2024-05-14 NOTE — Telephone Encounter (Signed)
 Left detailed message for patient.

## 2024-05-14 NOTE — Telephone Encounter (Signed)
 Ok to send in new meter and test strips covered by her insurance.

## 2024-05-26 ENCOUNTER — Other Ambulatory Visit: Payer: Self-pay

## 2024-05-26 MED ORDER — BLOOD GLUCOSE MONITORING SUPPL DEVI: 0 refills | Status: AC

## 2024-05-26 MED ORDER — LANCETS MISC. MISC
12 refills | Status: AC
Start: 2024-05-26 — End: ?

## 2024-05-26 MED ORDER — LANCET DEVICE MISC: 0 refills | Status: AC

## 2024-05-26 MED ORDER — BLOOD GLUCOSE TEST VI STRP: ORAL_STRIP | 12 refills | Status: AC

## 2024-05-26 NOTE — Telephone Encounter (Signed)
 Prescription has been resent. Will notify pt.

## 2024-05-26 NOTE — Telephone Encounter (Unsigned)
 Copied from CRM [id number]. Topic: Clinical - Medication Question >> May 26, 2024  3:25 PM Drema MATSU wrote: Reason for CRM: Patient is requesting a callback from [place]. She said that she doesn't have her glucose monitor and it has been 12 days.

## 2024-05-28 MED ORDER — ACCU-CHEK GUIDE W/DEVICE KIT: PACK | 0 refills | Status: AC

## 2024-05-28 MED ORDER — ACCU-CHEK SOFTCLIX LANCETS MISC: 12 refills | Status: AC

## 2024-05-28 MED ORDER — ACCU-CHEK GUIDE TEST VI STRP: ORAL_STRIP | 12 refills | Status: AC

## 2024-05-28 NOTE — Telephone Encounter (Signed)
 LM for patient

## 2024-05-28 NOTE — Telephone Encounter (Unsigned)
 Copied from CRM #[id number]. Topic: Clinical - Medical Advice >> May 28, 2024  2:34 PM Harlene ORN wrote: Reason for CRM: trisha left her a call about her glucose monitor Her insurance is no longer going to cover her glucose moniter. Please advise.

## 2024-07-22 ENCOUNTER — Other Ambulatory Visit

## 2024-07-25 ENCOUNTER — Ambulatory Visit (INDEPENDENT_AMBULATORY_CARE_PROVIDER_SITE_OTHER): Admitting: Internal Medicine

## 2024-07-25 DIAGNOSIS — E1165 Type 2 diabetes mellitus with hyperglycemia: Secondary | ICD-10-CM

## 2024-07-25 NOTE — Progress Notes (Deleted)
 Subjective:    Patient ID: Jody Wang, adult    DOB: 1946/07/07, 78 y.o.   MRN: [id number]  Patient here for No chief complaint on file.   HPI Here for a scheduled follow up - follow up regarding diabetes, hypertension and hypercholesterolemia.   F/u carotid?   Past Medical History:  Diagnosis Date   AK (actinic keratosis)    Anemia    Aortic atherosclerosis (HCC)    Carotid artery disease (HCC)    Carotid bruit    Chest pain 01/28/2017   Complication of anesthesia    hard time waking up x 1 surgery   Depression    Diabetes mellitus without complication (HCC)    GERD (gastroesophageal reflux disease)    Heart murmur    Hypercholesterolemia    Hypothyroidism    Lung nodule, multiple    Osteoarthritis of knee    Shingles    Wears dentures    partial lower   Past Surgical History:  Procedure Laterality Date   ABDOMINAL HYSTERECTOMY  1985   ovaries not removed   BREAST CYST EXCISION Right    BREAST CYST EXCISION Left    CATARACT EXTRACTION W/PHACO Right 02/16/2016   Procedure: CATARACT EXTRACTION PHACO AND INTRAOCULAR LENS PLACEMENT (IOC) RIGHT ;  Surgeon: Dene Etienne, MD;  Location: [facility];  Service: Ophthalmology;  Laterality: Right;  DIABETIC - oral meds   CATARACT EXTRACTION W/PHACO Left 03/15/2016   Procedure: CATARACT EXTRACTION PHACO AND INTRAOCULAR LENS PLACEMENT (IOC)left eye;  Surgeon: Dene Etienne, MD;  Location: [facility];  Service: Ophthalmology;  Laterality: Left;  DIABETIC - oral meds   FOOT SURGERY Bilateral    INNER EAR SURGERY Left    KNEE ARTHROPLASTY Right 03/17/2022   Procedure: COMPUTER ASSISTED TOTAL KNEE ARTHROPLASTY;  Surgeon: Mardee Lynwood SQUIBB, MD;  Location: [facility];  Service: Orthopedics;  Laterality: Right;   KNEE ARTHROPLASTY Left 08/23/2022   Procedure: COMPUTER ASSISTED TOTAL KNEE ARTHROPLASY;  Surgeon: Mardee Lynwood SQUIBB, MD;  Location: [facility];  Service: Orthopedics;  Laterality: Left;    Family History  Problem Relation Age of Onset   Diabetes Father    Coronary artery disease Father        s/p CABG   Hypertension Father    Hypercholesterolemia Father    Hypertension Brother    Diabetes Brother    Hypertension Sister    Glaucoma Sister    Diabetes Other        niece   Breast cancer Maternal Aunt    Breast cancer Paternal Aunt    Cervical cancer Paternal Aunt    Breast cancer Cousin    Breast cancer Other    Social History   Socioeconomic History   Marital status: Widowed    Spouse name: Not on file   Number of children: 3   Years of education: Not on file   Highest education level: Some college, no degree  Occupational History   Not on file  Tobacco Use   Smoking status: Former    Current packs/day: 0.00    Average packs/day: 1 pack/day for 23.0 years (23.0 ttl pk-yrs)    Types: Cigarettes    Start date: 11/19/1960    Quit date: 11/20/1983    Years since quitting: 40.7   Smokeless tobacco: Never  Vaping Use   Vaping status: Never Used  Substance and Sexual Activity   Alcohol use: Yes    Comment: every monday   Drug use: No   Sexual  activity: Never  Other Topics Concern   Not on file  Social History Narrative   Lives at home with son.    Social Drivers of Corporate investment banker Strain: Low Risk  (04/07/2024)   Overall Financial Resource Strain (CARDIA)    Difficulty of Paying Living Expenses: Not hard at all  Food Insecurity: No Food Insecurity (04/07/2024)   Hunger Vital Sign    Worried About Running Out of Food in the Last Year: Never true    Ran Out of Food in the Last Year: Never true  Transportation Needs: No Transportation Needs (04/07/2024)   PRAPARE - Administrator, Civil Service (Medical): No    Lack of Transportation (Non-Medical): No  Physical Activity: Sufficiently Active (04/07/2024)   Exercise Vital Sign    Days of Exercise per Week: 5 days    Minutes of Exercise per Session: 30 min  Stress: No Stress Concern  Present (04/07/2024)   [employer] of Occupational Health - Occupational Stress Questionnaire    Feeling of Stress : Only a little  Social Connections: Moderately Integrated (04/07/2024)   Social Connection and Isolation Panel    Frequency of Communication with Friends and Family: More than three times a week    Frequency of Social Gatherings with Friends and Family: More than three times a week    Attends Religious Services: More than 4 times per year    Active Member of [employer] or Organizations: Yes    Attends Banker Meetings: More than 4 times per year    Marital Status: Widowed     Review of Systems     Objective:     There were no vitals taken for this visit. Wt Readings from Last 3 Encounters:  04/07/24 132 lb (59.9 kg)  03/20/24 133 lb 9.6 oz (60.6 kg)  11/15/23 133 lb (60.3 kg)    Physical Exam  {Perform Simple Foot Exam  Perform Detailed exam:1} {Insert foot Exam (Optional):30965}   Outpatient Encounter Medications as of 07/25/2024  Medication Sig   Accu-Chek Softclix Lancets lancets Use as instructed   acetaminophen  (TYLENOL ) 325 MG tablet Take 650 mg by mouth every 6 (six) hours as needed.   Blood Glucose Monitoring Suppl (ACCU-CHEK GUIDE) w/Device KIT Check blood sugar tid   Blood Glucose Monitoring Suppl DEVI Use to check blood sugars twice daily dx e11.9   celecoxib  (CELEBREX ) 200 MG capsule Take 1 capsule (200 mg total) by mouth 2 (two) times daily.   cholecalciferol  (VITAMIN D ) 1000 units tablet Take 1,000 Units by mouth daily.   Dulaglutide  (TRULICITY ) 4.5 MG/0.5ML SOAJ Inject 4.5 mg as directed once a week.   ferrous sulfate  325 (65 FE) MG EC tablet Take 325 mg by mouth daily.   FLUoxetine  (PROZAC ) 20 MG capsule Take 1 capsule (20 mg total) by mouth daily.   fluticasone  (FLONASE ) 50 MCG/ACT nasal spray Use 2 sprays in both nostrils daily   glucose blood (ACCU-CHEK GUIDE TEST) test strip Use as instructed   Glucose Blood (BLOOD GLUCOSE TEST  STRIPS) STRP Use as instructed to check blood sugar twice daily. Dx e11.9   Lancet Device MISC Use to check blood sugars twice daily. Dx e11.9   Lancets Misc. MISC Use to check blood sugars twice daily. Dx e11.9   levocetirizine (XYZAL ) 5 MG tablet Take 1 tablet (5 mg total) by mouth daily.   levothyroxine  (SYNTHROID ) 88 MCG tablet Take 1 tablet (88 mcg total) by mouth daily.   metFORMIN  (  GLUCOPHAGE -XR) 500 MG 24 hr tablet TAKE 2 TABLETS BY MOUTH TWICE  DAILY   pantoprazole  (PROTONIX ) 40 MG tablet Take 1 tablet (40 mg total) by mouth daily.   rosuvastatin  (CRESTOR ) 40 MG tablet Take 1 tablet (40 mg total) by mouth daily.   traZODone  (DESYREL ) 50 MG tablet Take 1 tablet (50 mg total) by mouth daily.   vitamin B-12 (CYANOCOBALAMIN ) 1000 MCG tablet Take 1,000 mcg by mouth daily.   No facility-administered encounter medications on file as of 07/25/2024.     Lab Results  Component Value Date   WBC 8.4 03/18/2024   HGB 13.4 03/18/2024   HCT 40.0 03/18/2024   PLT 333.0 03/18/2024   GLUCOSE 137 (H) 03/18/2024   CHOL 125 03/18/2024   TRIG 97.0 03/18/2024   HDL 49.20 03/18/2024   LDLDIRECT 188.8 06/30/2013   LDLCALC 57 03/18/2024   ALT 9 03/18/2024   AST 11 03/18/2024   NA 139 03/18/2024   K 4.4 03/18/2024   CL 103 03/18/2024   CREATININE 0.75 03/18/2024   BUN 13 03/18/2024   CO2 25 03/18/2024   TSH 3.11 03/18/2024   HGBA1C 7.5 (H) 03/18/2024   MICROALBUR <0.2 11/05/2014    MM 3D SCREEN BREAST BILATERAL Result Date: 12/28/2022 CLINICAL DATA:  Screening. EXAM: DIGITAL SCREENING BILATERAL MAMMOGRAM WITH TOMOSYNTHESIS AND CAD TECHNIQUE: Bilateral screening digital craniocaudal and mediolateral oblique mammograms were obtained. Bilateral screening digital breast tomosynthesis was performed. The images were evaluated with computer-aided detection. COMPARISON:  Previous exam(s). ACR Breast Density Category b: There are scattered areas of fibroglandular density. FINDINGS: There are no findings  suspicious for malignancy. IMPRESSION: No mammographic evidence of malignancy. A result letter of this screening mammogram will be mailed directly to the patient. RECOMMENDATION: Screening mammogram in one year. (Code:SM-B-01Y) BI-RADS CATEGORY  1: Negative. Electronically Signed   By: Reyes Phi M.D.   On: 12/28/2022 17:39       Assessment & Plan:  There are no diagnoses linked to this encounter.   Allena Hamilton, MD

## 2024-07-26 ENCOUNTER — Encounter: Payer: Self-pay | Admitting: Internal Medicine

## 2024-07-26 NOTE — Progress Notes (Signed)
 Patient ID: Jody Wang, adult   DOB: [birth year], 78 y.o.   MRN: [id number] Did not show for appt.

## 2024-08-08 ENCOUNTER — Other Ambulatory Visit: Payer: Self-pay | Admitting: Internal Medicine

## 2024-09-29 ENCOUNTER — Other Ambulatory Visit: Payer: Self-pay | Admitting: Internal Medicine

## 2024-09-29 ENCOUNTER — Telehealth: Payer: Self-pay

## 2024-09-29 NOTE — Telephone Encounter (Signed)
 PAP: Patient assistance application for Trulicity through [employer] has been mailed to pt's home address on file. Provider portion of application will be faxed to provider's office.

## 2024-10-21 NOTE — Telephone Encounter (Signed)
 Reached out to patient regarding Pap application ([place])  trulicity - left HIPAA compliant v/m to return call.

## 2024-10-31 NOTE — Telephone Encounter (Signed)
 Received Provider portion PAP application for Trulicity  ([place]illy)

## 2024-11-03 ENCOUNTER — Telehealth: Payer: Self-pay | Admitting: Internal Medicine

## 2024-11-03 NOTE — Telephone Encounter (Signed)
 Pt's son Johnie dropped off [employer] paperwork to be filled out for pt, it's in the color folder up front. Also, Son would like someone to reach out to him. He's having concerns about pt's cognitive skills and would like to speak with Dr Glendia about it.

## 2024-11-04 NOTE — Telephone Encounter (Signed)
 Spoke to pt's son. Notified him pcp is aware and that paperwork has already been completed for lilly care

## 2025-04-13 ENCOUNTER — Ambulatory Visit
